# Patient Record
Sex: Female | Born: 1975
Health system: Southern US, Community
[De-identification: ages and names within clinical notes are randomized; demographics above are authoritative.]

## PROBLEM LIST (undated history)

## (undated) DIAGNOSIS — I82409 Acute embolism and thrombosis of unspecified deep veins of unspecified lower extremity: Secondary | ICD-10-CM

## (undated) DIAGNOSIS — D649 Anemia, unspecified: Secondary | ICD-10-CM

## (undated) DIAGNOSIS — J0101 Acute recurrent maxillary sinusitis: Secondary | ICD-10-CM

## (undated) DIAGNOSIS — E785 Hyperlipidemia, unspecified: Secondary | ICD-10-CM

## (undated) DIAGNOSIS — M549 Dorsalgia, unspecified: Secondary | ICD-10-CM

## (undated) DIAGNOSIS — M1990 Unspecified osteoarthritis, unspecified site: Secondary | ICD-10-CM

## (undated) DIAGNOSIS — G8929 Other chronic pain: Secondary | ICD-10-CM

## (undated) DIAGNOSIS — D573 Sickle-cell trait: Secondary | ICD-10-CM

## (undated) DIAGNOSIS — J189 Pneumonia, unspecified organism: Secondary | ICD-10-CM

## (undated) DIAGNOSIS — K219 Gastro-esophageal reflux disease without esophagitis: Secondary | ICD-10-CM

## (undated) DIAGNOSIS — R011 Cardiac murmur, unspecified: Secondary | ICD-10-CM

## (undated) DIAGNOSIS — R0602 Shortness of breath: Secondary | ICD-10-CM

## (undated) DIAGNOSIS — J45909 Unspecified asthma, uncomplicated: Secondary | ICD-10-CM

## (undated) DIAGNOSIS — K589 Irritable bowel syndrome without diarrhea: Secondary | ICD-10-CM

## (undated) DIAGNOSIS — T783XXA Angioneurotic edema, initial encounter: Secondary | ICD-10-CM

## (undated) DIAGNOSIS — J8410 Pulmonary fibrosis, unspecified: Secondary | ICD-10-CM

## (undated) DIAGNOSIS — T07XXXA Unspecified multiple injuries, initial encounter: Secondary | ICD-10-CM

## (undated) DIAGNOSIS — R1011 Right upper quadrant pain: Secondary | ICD-10-CM

## (undated) DIAGNOSIS — G894 Chronic pain syndrome: Secondary | ICD-10-CM

## (undated) DIAGNOSIS — L309 Dermatitis, unspecified: Secondary | ICD-10-CM

## (undated) HISTORY — DX: Unspecified multiple injuries, initial encounter: T07.XXXA

## (undated) HISTORY — PX: ABDOMINAL HYSTERECTOMY: SHX81

## (undated) HISTORY — DX: Angioneurotic edema, initial encounter: T78.3XXA

## (undated) HISTORY — PX: FRACTURE SURGERY: SHX138

## (undated) HISTORY — PX: BACK SURGERY: SHX140

---

## 1997-10-13 HISTORY — PX: TUBAL LIGATION: SHX77

## 1998-01-17 ENCOUNTER — Inpatient Hospital Stay (HOSPITAL_COMMUNITY): Admission: AD | Admit: 1998-01-17 | Discharge: 1998-01-21 | Payer: Self-pay | Admitting: Obstetrics

## 1998-01-24 ENCOUNTER — Inpatient Hospital Stay (HOSPITAL_COMMUNITY): Admission: AD | Admit: 1998-01-24 | Discharge: 1998-01-24 | Payer: Self-pay | Admitting: Obstetrics

## 1998-01-25 ENCOUNTER — Encounter: Admission: RE | Admit: 1998-01-25 | Discharge: 1998-04-25 | Payer: Self-pay | Admitting: Obstetrics

## 1998-02-13 ENCOUNTER — Inpatient Hospital Stay (HOSPITAL_COMMUNITY): Admission: AD | Admit: 1998-02-13 | Discharge: 1998-02-15 | Payer: Self-pay | Admitting: *Deleted

## 1998-03-07 ENCOUNTER — Inpatient Hospital Stay (HOSPITAL_COMMUNITY): Admission: AD | Admit: 1998-03-07 | Discharge: 1998-03-07 | Payer: Self-pay | Admitting: Obstetrics

## 1998-03-20 ENCOUNTER — Inpatient Hospital Stay (HOSPITAL_COMMUNITY): Admission: AD | Admit: 1998-03-20 | Discharge: 1998-03-20 | Payer: Self-pay | Admitting: *Deleted

## 1998-10-13 HISTORY — PX: OTHER SURGICAL HISTORY: SHX169

## 2001-10-22 ENCOUNTER — Encounter: Payer: Self-pay | Admitting: Internal Medicine

## 2001-10-22 ENCOUNTER — Ambulatory Visit (HOSPITAL_COMMUNITY): Admission: RE | Admit: 2001-10-22 | Discharge: 2001-10-22 | Payer: Self-pay | Admitting: Internal Medicine

## 2006-10-16 ENCOUNTER — Emergency Department (HOSPITAL_COMMUNITY): Admission: EM | Admit: 2006-10-16 | Discharge: 2006-10-16 | Payer: Self-pay | Admitting: Emergency Medicine

## 2006-10-17 ENCOUNTER — Emergency Department (HOSPITAL_COMMUNITY): Admission: EM | Admit: 2006-10-17 | Discharge: 2006-10-17 | Payer: Self-pay | Admitting: Emergency Medicine

## 2007-09-17 ENCOUNTER — Emergency Department (HOSPITAL_COMMUNITY): Admission: EM | Admit: 2007-09-17 | Discharge: 2007-09-17 | Payer: Self-pay | Admitting: Emergency Medicine

## 2007-10-14 HISTORY — PX: PARTIAL HYSTERECTOMY: SHX80

## 2010-02-15 ENCOUNTER — Ambulatory Visit: Payer: Self-pay | Admitting: Family Medicine

## 2010-02-19 ENCOUNTER — Ambulatory Visit: Payer: Self-pay | Admitting: Family Medicine

## 2010-02-19 ENCOUNTER — Encounter: Payer: Self-pay | Admitting: Physician Assistant

## 2010-02-19 DIAGNOSIS — J209 Acute bronchitis, unspecified: Secondary | ICD-10-CM | POA: Insufficient documentation

## 2010-02-19 DIAGNOSIS — J309 Allergic rhinitis, unspecified: Secondary | ICD-10-CM | POA: Insufficient documentation

## 2010-02-19 DIAGNOSIS — K219 Gastro-esophageal reflux disease without esophagitis: Secondary | ICD-10-CM | POA: Insufficient documentation

## 2010-02-22 ENCOUNTER — Ambulatory Visit: Payer: Self-pay | Admitting: Family Medicine

## 2010-02-22 ENCOUNTER — Encounter: Payer: Self-pay | Admitting: Physician Assistant

## 2010-03-12 ENCOUNTER — Ambulatory Visit: Payer: Self-pay | Admitting: Family Medicine

## 2010-03-12 DIAGNOSIS — J45991 Cough variant asthma: Secondary | ICD-10-CM | POA: Insufficient documentation

## 2010-03-13 ENCOUNTER — Encounter: Payer: Self-pay | Admitting: Physician Assistant

## 2010-03-18 ENCOUNTER — Encounter: Payer: Self-pay | Admitting: Physician Assistant

## 2010-03-18 ENCOUNTER — Ambulatory Visit (HOSPITAL_COMMUNITY): Admission: RE | Admit: 2010-03-18 | Discharge: 2010-03-18 | Payer: Self-pay | Admitting: Family Medicine

## 2010-04-10 ENCOUNTER — Ambulatory Visit: Payer: Self-pay | Admitting: Family Medicine

## 2010-04-10 DIAGNOSIS — J0100 Acute maxillary sinusitis, unspecified: Secondary | ICD-10-CM | POA: Insufficient documentation

## 2010-04-25 ENCOUNTER — Encounter: Payer: Self-pay | Admitting: Physician Assistant

## 2010-05-16 ENCOUNTER — Encounter: Payer: Self-pay | Admitting: Physician Assistant

## 2010-07-08 ENCOUNTER — Telehealth: Payer: Self-pay | Admitting: Physician Assistant

## 2010-11-14 NOTE — Letter (Signed)
Summary: Work Excuse  [facility]  [address]   [place], [place] [address]   Phone: [phone]  Fax: [phone]    Today's Date: Feb 22, 2010  Name of Patient: Jodi [name]  The above named patient had a medical visit today at:   9:45 am .  Please take this into consideration when reviewing the time away from work/school.    Special Instructions:  [  ] None  [  ] To be off the remainder of today, returning to the normal work / school schedule tomorrow.  [  ] To be off until the next scheduled appointment on ______________________.  [ X ] Other _Remain off work until Monday 02/25/10 due to illness.  Sincerely yours,   Esperanza Sheets PA

## 2010-11-14 NOTE — Assessment & Plan Note (Signed)
Summary: sick- room 1   Vital Signs:  Patient profile:   35 year old female Height:      66.5 inches Weight:      137.50 pounds BMI:     21.94 O2 Sat:      100 % on Room air Pulse rate:   82 / minute Resp:     16 per minute BP sitting:   126 / 70  (left arm)  Vitals Entered By: Adella Hare LPN (April 10, 2010 11:07 AM) CC: sinus drainage, throat bothers her off and on and still having trouble with breathing Is Patient Diabetic? No Pain Assessment Patient in pain? no        CC:  sinus drainage and throat bothers her off and on and still having trouble with breathing.  History of Present Illness: Pt presents today stating that she is still having alot of problems with her allergies.  She continues to have nasal congestion, and itchy watery eyes.  Her voice is still hoarse off and on.  She is taking Xyzal daily and using flonase. She feels that in the last few days she is getting increased congestion and sinus pressure like her last sinus infection.  She states Levaquin worked well for her last time. Also is still having problems with her asthma.  She is using Flovent daily, but is needing albuterol 1-2 times a day.  Especially when at work. No HS awakening.  Current Medications (verified): 1)  Omeprazole 20 Mg Cpdr (Omeprazole) .... Take 1 Daily 2)  Xyzal 5 Mg Tabs (Levocetirizine Dihydrochloride) .Marland Kitchen.. 1 Daily 3)  Ventolin Hfa 108 (90 Base) Mcg/act Aers (Albuterol Sulfate) .... Use 2 Puffs Every 4 Hrs As Needed 4)  Flonase 50 Mcg/act Susp (Fluticasone Propionate) .... Use 2 Sprays Each Nostril Once Daily 5)  Flovent Hfa 110 Mcg/act Aero (Fluticasone Propionate  Hfa) .... Use 2 Puffs Two Times A Day .  Rinse Mouth and Spit After Each Use.  Allergies (verified): 1)  ! Compazine  Past History:  Past medical history reviewed for relevance to current acute and chronic problems.  Past Medical History: Allergic rhinitis Asthma  Review of Systems General:  Denies chills and  fever. ENT:  Complains of nasal congestion, postnasal drainage, and sinus pressure; denies earache and sore throat. CV:  Denies chest pain or discomfort. Resp:  Complains of cough, shortness of breath, and wheezing; denies sputum productive. Allergy:  Complains of itching eyes and seasonal allergies; denies sneezing.  Physical Exam  General:  Well-developed,well-nourished,in no acute distress; alert,appropriate and cooperative throughout examination Head:  Normocephalic and atraumatic without obvious abnormalities. No apparent alopecia or balding. Ears:  External ear exam shows no significant lesions or deformities.  Otoscopic examination reveals clear canals, tympanic membranes are intact bilaterally without bulging, retraction, inflammation or discharge. Hearing is grossly normal bilaterally. Nose:  Nasal turbs are severly swollen and pale bilatno external deformity and no sinus percussion tenderness.   Mouth:  Oral mucosa and oropharynx without lesions or exudates.  Teeth in good repair. Neck:  No deformities, masses, or tenderness noted. Lungs:  Normal respiratory effort, chest expands symmetrically. Lungs are clear to auscultation, no crackles or wheezes. Heart:  Normal rate and regular rhythm. S1 and S2 normal without gallop, murmur, click, rub or other extra sounds. Cervical Nodes:  No lymphadenopathy noted Psych:  Cognition and judgment appear intact. Alert and cooperative with normal attention span and concentration. No apparent delusions, illusions, hallucinations   Impression & Recommendations:  Problem #  1:  SINUSITIS, ACUTE (ICD-461.9) Assessment New  The following medications were removed from the medication list:    Flonase 50 Mcg/act Susp (Fluticasone propionate) ..... Use 2 sprays each nostril once daily Her updated medication list for this problem includes:    Levaquin 750 Mg Tabs (Levofloxacin) .Marland Kitchen... Take 1 daily x 5 days  Problem # 2:  ALLERGIC RHINITIS  (ICD-477.9) Assessment: Unchanged  The following medications were removed from the medication list:    Flonase 50 Mcg/act Susp (Fluticasone propionate) ..... Use 2 sprays each nostril once daily Her updated medication list for this problem includes:    Xyzal 5 Mg Tabs (Levocetirizine dihydrochloride) .Marland Kitchen... 1 daily  Orders: Allergy Referral  (Allergy)  Problem # 3:  ASTHMA, INTERMITTENT (ICD-493.90) Assessment: Deteriorated  The following medications were removed from the medication list:    Flovent Hfa 110 Mcg/act Aero (Fluticasone propionate  hfa) ..... Use 2 puffs two times a day .  rinse mouth and spit after each use. Her updated medication list for this problem includes:    Ventolin Hfa 108 (90 Base) Mcg/act Aers (Albuterol sulfate) ..... Use 2 puffs every 4 hrs as needed    Advair Diskus 250-50 Mcg/dose Aepb (Fluticasone-salmeterol) ..... Use one inhalation two times a day    Singulair 10 Mg Tabs (Montelukast sodium) .Marland Kitchen... Take 1 daily for asthna and allergies  Orders: Allergy Referral  (Allergy)  Complete Medication List: 1)  Omeprazole 20 Mg Cpdr (Omeprazole) .... Take 1 daily 2)  Xyzal 5 Mg Tabs (Levocetirizine dihydrochloride) .Marland Kitchen.. 1 daily 3)  Ventolin Hfa 108 (90 Base) Mcg/act Aers (Albuterol sulfate) .... Use 2 puffs every 4 hrs as needed 4)  Levaquin 750 Mg Tabs (Levofloxacin) .... Take 1 daily x 5 days 5)  Advair Diskus 250-50 Mcg/dose Aepb (Fluticasone-salmeterol) .... Use one inhalation two times a day 6)  Singulair 10 Mg Tabs (Montelukast sodium) .... Take 1 daily for asthna and allergies  Patient Instructions: 1)  Please schedule a follow-up appointment as needed. 2)  I have referred you to an allergist. 3)  I have changed your asthma medicine from Flovent to Advair. 4)  I have added Singulair.  This helps allergies and asthma. 5)  Continue Xyzal. Prescriptions: SINGULAIR 10 MG TABS (MONTELUKAST SODIUM) take 1 daily for asthna and allergies  #30 x 2   Entered  and Authorized by:   Esperanza Sheets PA   Signed by:   Esperanza Sheets PA on 04/10/2010   Method used:   Electronically to        [facility]  [address] (retail)       [address]       [place], [place]  [address]       Ph: [phone]       Fax: [phone]   RxID:   [id number] ADVAIR DISKUS 250-50 MCG/DOSE AEPB (FLUTICASONE-SALMETEROL) use one inhalation two times a day  #1 x 2   Entered and Authorized by:   Esperanza Sheets PA   Signed by:   Esperanza Sheets PA on 04/10/2010   Method used:   Electronically to        [facility]  [address] (retail)       [address]       [place], [place]  [address]       Ph: [phone]       Fax: [phone]   RxID:  [id number] LEVAQUIN 750 MG TABS (LEVOFLOXACIN) take 1 daily x 5 days  #5 x 0   Entered and Authorized by:   Esperanza Sheets PA   Signed by:   Esperanza Sheets PA on 04/10/2010   Method used:   Electronically to        [facility]  [address] (retail)       [address]       [place], [place]  [address]       Ph: [phone]       Fax: [phone]   RxID:   [id number]

## 2010-11-14 NOTE — Assessment & Plan Note (Signed)
Summary: NEW PATIENT- room 1   Vital Signs:  Patient profile:   35 year old female Height:      66.5 inches Weight:      133.75 pounds BMI:     21.34 O2 Sat:      100 % on Room air Pulse rate:   83 / minute Resp:     16 per minute BP sitting:   136 / 80  (left arm)  Vitals Entered By: Adella Hare LPN (Feb 15, 1609 10:35 AM) CC: new patient Is Patient Diabetic? No Pain Assessment Patient in pain? no        CC:  new patient.  History of Present Illness: New pt here to establish care with new PCP.  She states that she has a sinus infection.  She has had them frequently in the past.  Last one was end of 2010.  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  This is a colder area.  Sxs started yesterday.  She reports nasal congestion, & post nasal drainage. Throat is sore & swollen feeling. She is taking some over the counter cold meds which do help.  She has not noticed the color of her mucus.  She denies cough or chest congestion.  Pt is otherwise healthy.  No other complaints or concerns. See's GYN yrly & is due later this summer. Labs have been done in the last couple of yrs and were all nl.   Current Medications (verified): 1)  None  Allergies (verified): 1)  ! Compazine  Past History:  Past medical, surgical, family and social histories (including risk factors) reviewed for relevance to current acute and chronic problems.  Past Surgical History: Tubal ligation 1999 Blood clot removed from lower abdomen 2000 Cyst removed from ovaryand fluid removed from tubes 2000 Partial Hysterectomy 2009  Family History: Reviewed history and no changes required. Mother living- asthma, chronic bronchitis, htn Father living- htn, recurrant rectal polyps, hyperlipidemia Two sisters living- depression, diabetes, lupus One brother deceased- MVA  Social History: Reviewed history and no changes required. Employed full timeHoliday representative and gamble,  Set designer Divorced 3 children 18,15, 11 Quit smoking this year Alcohol use-no Drug use-no Regular exercise-no Drug Use:  no Does Patient Exercise:  no  Review of Systems General:  Denies chills and fever. ENT:  Complains of nasal congestion, postnasal drainage, and sore throat; denies ear discharge and sinus pressure. CV:  Denies chest pain or discomfort. Resp:  Denies cough and shortness of breath. Allergy:  Complains of seasonal allergies; denies sneezing.  Physical Exam  General:  Well-developed,well-nourished,in no acute distress; alert,appropriate and cooperative throughout examination Head:  Normocephalic and atraumatic without obvious abnormalities. No apparent alopecia or balding. Ears:  External ear exam shows no significant lesions or deformities.  Otoscopic examination reveals clear canals, tympanic membranes are intact bilaterally without bulging, retraction, inflammation or discharge. Hearing is grossly normal bilaterally. Nose:  no external deformity, no sinus percussion tenderness, mucosal erythema, and mucosal edema.   Mouth:  Oral mucosa and oropharynx without lesions or exudates.  Teeth in good repair. Neck:  No deformities, masses, or tenderness noted. Lungs:  Normal respiratory effort, chest expands symmetrically. Lungs are clear to auscultation, no crackles or wheezes. Heart:  Normal rate and regular rhythm. S1 and S2 normal without gallop, murmur, click, rub or other extra sounds. Cervical Nodes:  No lymphadenopathy noted Psych:  Cognition and judgment appear intact. Alert and cooperative with normal attention span and concentration.  No apparent delusions, illusions, hallucinations   Impression & Recommendations:  Problem # 1:  SINUSITIS, ACUTE (ICD-461.9) Assessment New  Her updated medication list for this problem includes:    Amoxicillin 875 Mg Tabs (Amoxicillin) .Marland Kitchen... Take 1 two times a day for 10 days  Complete Medication List: 1)  Amoxicillin  875 Mg Tabs (Amoxicillin) .... Take 1 two times a day for 10 days  Patient Instructions: 1)  Please schedule a follow-up appointment as needed. 2)  Get plenty of rest, drink lots of clear liquids, and use Tylenol or Ibuprofen for fever and comfort. Return in 7-10 days if you're not better:sooner if you're feeling worse. 3)  You have received a shot of Depo Medrol today to help with your sinus swelling & infection. 4)  I have also prescribed an antibiotic for you. Prescriptions: AMOXICILLIN 875 MG TABS (AMOXICILLIN) take 1 two times a day for 10 days  #20 x 0   Entered and Authorized by:   Esperanza Sheets PA   Signed by:   Adella Hare LPN on 16/07/9603   Method used:   Electronically to        [facility] (retail)       [address]       [place], [place]  [address]       Ph: [phone]       Fax: [phone]   RxID:   [id number]   Appended Document: NEW PATIENT- room 1   Medication Administration  Injection # 1:    Medication: Depo- Medrol 80mg     Diagnosis: SINUSITIS, ACUTE (ICD-461.9)    Route: IM    Site: RUOQ gluteus    Exp Date: 1/12    Lot #: Johny [id number]    Mfr: Pharmacia    Patient tolerated injection without complications    Given by: Adella Hare LPN (Feb 15, 1026 4:06 PM)  Orders Added: 1)  Depo- Medrol 80mg  [J1040] 2)  Admin of Therapeutic Inj  intramuscular or subcutaneous [25366]

## 2010-11-14 NOTE — Assessment & Plan Note (Signed)
Summary: sick- room 3   Vital Signs:  Patient profile:   35 year old female Height:      66.5 inches Weight:      128.25 pounds BMI:     20.46 O2 Sat:      100 % on Room air Pulse rate:   86 / minute Resp:     16 per minute BP sitting:   120 / 80  (left arm)  Vitals Entered By: Adella Hare LPN (Feb 19, 2010 9:16 AM) CC: head congestion, chills, chest pains, body aches Is Patient Diabetic? No Pain Assessment Patient in pain? no        CC:  head congestion, chills, chest pains, and body aches.  History of Present Illness: Pt is here today due to cough, and burning in her chest.  This started after she tried to return to work.  Worked 1 day only. She is still having nasal congestion but states she feels like her sinus congestion has improved with her abx rx.  This is clear in color. + sneezing and itchy watery eyes.  No sinus pressure.  She is taking her antibiotic as prescribed.  Also has intermittent sharp pains in Lt chest x 1-2 mos.  No assoc with food/eating.  + indigestion.  Has been using over the counter gas prods and has helped with pain and indigestion. No HS syptoms.  Notices more when active and moving.  No diaphoresis or difficulty breathing.  Appetite has been normal.  Hx of seasonal allergies.  States Loratdine, and Allegra no help in the past.  Zyrtec did work well.  Not taking anything currently.  Current Medications (verified): 1)  Amoxicillin 875 Mg Tabs (Amoxicillin) .... Take 1 Two Times A Day For 10 Days  Allergies (verified): 1)  ! Compazine  Past History:  Past medical history reviewed for relevance to current acute and chronic problems.  Review of Systems General:  Denies chills and fever. CV:  Complains of chest pain or discomfort; denies lightheadness, palpitations, and shortness of breath with exertion. Resp:  Complains of cough; denies shortness of breath, sputum productive, and wheezing. Allergy:  Complains of persistent infections, seasonal  allergies, and sneezing.  Physical Exam  General:  Well-developed,well-nourished,in no acute distress; alert,appropriate and cooperative throughout examination Head:  Normocephalic and atraumatic without obvious abnormalities. No apparent alopecia or balding. Ears:  External ear exam shows no significant lesions or deformities.  Otoscopic examination reveals clear canals, tympanic membranes are intact bilaterally without bulging, retraction, inflammation or discharge. Hearing is grossly normal bilaterally. Nose:  Bilat nasal turbs mod swollen and pale.no external deformity and no sinus percussion tenderness.   Mouth:  Oral mucosa and oropharynx without lesions or exudates.  Teeth in good repair. Neck:  No deformities, masses, or tenderness noted. Chest Wall:  no deformities and costochondrial tenderness Lt 4th rib only.   Lungs:  Normal respiratory effort, chest expands symmetrically. Lungs are clear to auscultation, no crackles or wheezes. Heart:  Normal rate and regular rhythm. S1 and S2 normal without gallop, murmur, click, rub or other extra sounds. Abdomen:  soft, no masses, no hepatomegaly, and no splenomegaly.  Pt does have mild TTP at epigastrum. Cervical Nodes:  No lymphadenopathy noted Psych:  Cognition and judgment appear intact. Alert and cooperative with normal attention span and concentration. No apparent delusions, illusions, hallucinations   Impression & Recommendations:  Problem # 1:  ALLERGIC RHINITIS (ICD-477.9) Assessment Deteriorated If Xyzal is not covered by ins pt will purchase Zyrtec  over the counter.  Her updated medication list for this problem includes:    Xyzal 5 Mg Tabs (Levocetirizine dihydrochloride) .Marland Kitchen... 1 daily  Problem # 2:  ACUTE BRONCHITIS (ICD-466.0) Assessment: New discussed with pt that this is most likely viral.  Her updated medication list for this problem includes:    Amoxicillin 875 Mg Tabs (Amoxicillin) .Marland Kitchen... Take 1 two times a day for 10  days    Tessalon Perles 100 Mg Caps (Benzonatate) .Marland Kitchen... Take 1-2 every 8 hrs as needed for cough  Problem # 3:  GERD (ICD-530.81) Assessment: New  Her updated medication list for this problem includes:    Omeprazole 20 Mg Cpdr (Omeprazole) .Marland Kitchen... Take 1 daily  Problem # 4:  SINUSITIS, ACUTE (ICD-461.9) Assessment: Improved complete antibiotic prescription.  Her updated medication list for this problem includes:    Amoxicillin 875 Mg Tabs (Amoxicillin) .Marland Kitchen... Take 1 two times a day for 10 days    Tessalon Perles 100 Mg Caps (Benzonatate) .Marland Kitchen... Take 1-2 every 8 hrs as needed for cough  Complete Medication List: 1)  Amoxicillin 875 Mg Tabs (Amoxicillin) .... Take 1 two times a day for 10 days 2)  Omeprazole 20 Mg Cpdr (Omeprazole) .... Take 1 daily 3)  Xyzal 5 Mg Tabs (Levocetirizine dihydrochloride) .Marland Kitchen.. 1 daily 4)  Tessalon Perles 100 Mg Caps (Benzonatate) .... Take 1-2 every 8 hrs as needed for cough  Patient Instructions: 1)  Please schedule a follow-up appointment as needed. 2)  Continue Amoxicillin. 3)  I have prescribed an allergy pill, a stomach medicine, and some cough medicine for you. 4)  Increased fluids and rest. 5)  Recommended remaining out of work for 3 additional days. Prescriptions: TESSALON PERLES 100 MG CAPS (BENZONATATE) take 1-2 every 8 hrs as needed for cough  #30 x 0   Entered and Authorized by:   Esperanza Sheets PA   Signed by:   Esperanza Sheets PA on 02/19/2010   Method used:   Electronically to        [facility]  [address] (retail)       [address]       [place], [place]  [address]       Ph: [phone]       Fax: [phone]   RxID:   [id number] XYZAL 5 MG TABS (LEVOCETIRIZINE DIHYDROCHLORIDE) 1 daily  #30 x 2   Entered and Authorized by:   Esperanza Sheets PA   Signed by:   Esperanza Sheets PA on 02/19/2010   Method used:   Electronically to        [facility]  [address] (retail)       [address]       [place], [place]  [address]       Ph: [phone]       Fax: [phone]   RxID:   [id number] OMEPRAZOLE 20 MG CPDR (OMEPRAZOLE) take 1 daily  #30 x 3   Entered and Authorized by:   Esperanza Sheets PA   Signed by:   Esperanza Sheets PA on 02/19/2010   Method used:   Electronically to        [facility]  [address] (retail)       [address]       [place], [place]  [address]       Ph: [phone]  Fax: [phone]   RxID:   [id number]

## 2010-11-14 NOTE — Letter (Signed)
Summary: [employer]MLA PAPERS  [employer]ML[employer] P[employer]ERS   Imported By: Lind Guest 03/13/2010 14:32:09  _____________________________________________________________________  External Attachment:    Type:   Image     Comment:   External Document

## 2010-11-14 NOTE — Assessment & Plan Note (Signed)
Summary: Still sick ROOM 3   Vital Signs:  Patient profile:   35 year old female Height:      66.5 inches Weight:      132.75 pounds BMI:     21.18 O2 Sat:      99 % Temp:     97.5 degrees F oral Pulse rate:   76 / minute Resp:     16 per minute BP sitting:   120 / 84  (left arm) Cuff size:   regular  Vitals Entered By: Everitt Amber LPN (Feb 22, 2010 9:53 AM) CC: Still hoarse, chest tight, unable to being up any phlegm, some chills, no fever   CC:  Still hoarse, chest tight, unable to being up any phlegm, some chills, and no fever.  History of Present Illness: Pt states that today she feels better than she has the last few days.  Her nasal congestion and allergies are doing better. In the last couple of days though she has developed tightness & wheezing in her chest. She still has chills though she has not had a fever.  She is taking her antibiotic, and allergy medicine as prescribed.  Had a hx of asthma as a young child but hasn't had problems with for many yrs. "I out grew it."  Current Medications (verified): 1)  Amoxicillin 875 Mg Tabs (Amoxicillin) .... Take 1 Two Times A Day For 10 Days 2)  Omeprazole 20 Mg Cpdr (Omeprazole) .... Take 1 Daily 3)  Xyzal 5 Mg Tabs (Levocetirizine Dihydrochloride) .Marland Kitchen.. 1 Daily 4)  Tessalon Perles 100 Mg Caps (Benzonatate) .... Take 1-2 Every 8 Hrs As Needed For Cough  Allergies (verified): 1)  ! Compazine  Past History:  Past medical history reviewed for relevance to current acute and chronic problems.  Review of Systems General:  Complains of chills; denies fever. ENT:  Denies earache, nasal congestion, sinus pressure, and sore throat. CV:  Denies chest pain or discomfort. Resp:  Complains of cough, shortness of breath, and wheezing; denies sputum productive.  Physical Exam  General:  Well-developed,well-nourished,in no acute distress; alert,appropriate and cooperative throughout examination Head:  Normocephalic and atraumatic  without obvious abnormalities. No apparent alopecia or balding. Ears:  External ear exam shows no significant lesions or deformities.  Otoscopic examination reveals clear canals, tympanic membranes are intact bilaterally without bulging, retraction, inflammation or discharge. Hearing is grossly normal bilaterally. Nose:  no external deformity.  Nasal turbs bilat still swollen & pale Mouth:  Oral mucosa and oropharynx without lesions or exudates.  Teeth in good repair. Neck:  No deformities, masses, or tenderness noted. Lungs:  Tight, decreased BS bilat.   After NMT good A/E.  No wheeze, rales, or rhonchi. Heart:  Normal rate and regular rhythm. S1 and S2 normal without gallop, murmur, click, rub or other extra sounds. Cervical Nodes:  No lymphadenopathy noted Psych:  Cognition and judgment appear intact. Alert and cooperative with normal attention span and concentration. No apparent delusions, illusions, hallucinations   Impression & Recommendations:  Problem # 1:  BRONCHITIS, ACUTE WITH MILD BRONCHOSPASM (ICD-466.0) Assessment Unchanged  The following medications were removed from the medication list:    Amoxicillin 875 Mg Tabs (Amoxicillin) .Marland Kitchen... Take 1 two times a day for 10 days Her updated medication list for this problem includes:    Tessalon Perles 100 Mg Caps (Benzonatate) .Marland Kitchen... Take 1-2 every 8 hrs as needed for cough    Ventolin Hfa 108 (90 Base) Mcg/act Aers (Albuterol sulfate) ..... Use 2 puffs  every 4 hrs as needed    Levaquin 750 Mg Tabs (Levofloxacin) .Marland Kitchen... Take 1 daily x 5 days  Problem # 2:  ALLERGIC RHINITIS (ICD-477.9) Assessment: Improved  Her updated medication list for this problem includes:    Xyzal 5 Mg Tabs (Levocetirizine dihydrochloride) .Marland Kitchen... 1 daily  Complete Medication List: 1)  Omeprazole 20 Mg Cpdr (Omeprazole) .... Take 1 daily 2)  Xyzal 5 Mg Tabs (Levocetirizine dihydrochloride) .Marland Kitchen.. 1 daily 3)  Tessalon Perles 100 Mg Caps (Benzonatate) .... Take 1-2  every 8 hrs as needed for cough 4)  Ventolin Hfa 108 (90 Base) Mcg/act Aers (Albuterol sulfate) .... Use 2 puffs every 4 hrs as needed 5)  Medrol (pak) 4 Mg Tabs (Methylprednisolone) .... Take as directed 6)  Levaquin 750 Mg Tabs (Levofloxacin) .... Take 1 daily x 5 days  Other Orders: Albuterol Sulfate Sol 1mg  unit dose (Z6109) Nebulizer Tx (60454)  Patient Instructions: 1)  Please schedule a follow-up appointment as needed. 2)  Get plenty of rest, drink lots of clear liquids, and use Tylenol or Ibuprofen for fever and comfort. Return in 7-10 days if you're not better:sooner if you're feeling worse. 3)  I have prescribed a steroid, a different antibiotic and an inhaler for you to use. 4)  Stop your previous antibiotic. 5)  You may still take your allergy pill Prescriptions: LEVAQUIN 750 MG TABS (LEVOFLOXACIN) take 1 daily x 5 days  #5 x 0   Entered and Authorized by:   Esperanza Sheets PA   Signed by:   Everitt Amber LPN on 09/81/1914   Method used:   Electronically to        [facility]  [address] (retail)       [address]       [place], [place]  [address]       Ph: [phone]       Fax: [phone]   RxID:   [id number] MEDROL (PAK) 4 MG TABS (METHYLPREDNISOLONE) take as directed  #1 pack x 0   Entered and Authorized by:   Esperanza Sheets PA   Signed by:   Everitt Amber LPN on 40/34/7425   Method used:   Electronically to        [facility]  [address] (retail)       [address]       [place], [place]  [address]       Ph: [phone]       Fax: [phone]   RxID:   [id number] VENTOLIN HFA 108 (90 BASE) MCG/ACT AERS (ALBUTEROL SULFATE) use 2 puffs every 4 hrs as needed  #1 x 0   Entered and Authorized by:   Esperanza Sheets PA   Signed by:   Everitt Amber LPN on 25/42/7062   Method used:   Electronically to        [facility]  [address] (retail)       [address]       [place], [place]  [address]       Ph: [phone]        Fax: [phone]   RxID:   [id number]    Medication Administration  Medication # 1:    Medication: Albuterol Sulfate Sol 1mg  unit dose    Diagnosis: ACUTE BRONCHITIS (ICD-466.0)    Dose: 2.5/79ml    Route: inhaled  Exp Date: 09/2010    Lot #: [id number]    Mfr: nephron    Patient tolerated medication without complications    Given by: Everitt Amber LPN (Feb 22, 2010 11:01 AM)  Orders Added: 1)  Albuterol Sulfate Sol 1mg  unit dose [J7613] 2)  Nebulizer Tx [94640] 3)  Est. Patient Level III [04540]

## 2010-11-14 NOTE — Progress Notes (Signed)
  Phone Note Call from Patient   Summary of Call: Patient called in and said she had UTI and wanted a sulfur drug called in. I advised her she would have to make app and she said that she cannot miss work for a UTI and her old doc knows she gets them all the time and calls her in [place]. She wants to know if you would send her something to [facility]. I told her I could ask but the policy was an OV. What do you want to do for this patient? Initial call taken by: Everitt Amber LPN,  July 08, 2010 1:39 PM  Follow-up for Phone Call        You are correct.  She needs an OV.  If she cannot come in during office hours then I recommend she go to an urgent care. Follow-up by: Esperanza Sheets PA,  July 09, 2010 8:18 AM  Additional Follow-up for Phone Call Additional follow up Details #1::        I advised patient she needed an OV and she did not like that but I told her it was the policy and that I was sorry but she could try urgent care and she said they  didn't take her insurance. She is going to call her OB again since her work is short staffed and she can't miss any time Additional Follow-up by: Everitt Amber LPN,  July 09, 2010 3:08 PM

## 2010-11-14 NOTE — Letter (Signed)
Summary: Out of Work  [facility]  [address]   [place], [place] [address]   Phone: [phone]  Fax: [phone]    Feb 19, 2010   Employee:  DALEYSSA LOISELLE    To Whom It May Concern:   For Medical reasons, please excuse the above named employee from work for the following dates:  Start:   02/17/10  End:   02/22/10 may return to work without restriction  If you need additional information, please feel free to contact our office.         Sincerely,    Esperanza Sheets PA

## 2010-11-14 NOTE — Assessment & Plan Note (Signed)
Summary: fmla papers / sore throat - room 1   Vital Signs:  Patient profile:   35 year old female Height:      66.5 inches Weight:      133.75 pounds BMI:     21.34 O2 Sat:      100 % on Room air Pulse rate:   86 / minute Resp:     16 per minute BP sitting:   110 / 78  (left arm)  Vitals Entered By: Adella Hare LPN (Mar 12, 2010 9:02 AM) CC: fmla papers and sore throat Is Patient Diabetic? No Pain Assessment Patient in pain? no      Comments did not bring meds to ov   CC:  fmla papers and sore throat.  History of Present Illness: Pt states she is getting post nasal drainage and this is making her throat a little sore.  Alot of sneezing.  She is taking her Xyzal daily but isnt lasting 24 hrs, so she is taking it earlier every day. she is wheezing approx 3 times a week, relieved with albuterol inhaler. This is mostly at work. She had a hx of asthma as a child, but hasn't had any syptoms or problems for many yrs.  If yawns or stretches gets pain in her Lt upper chest to shoulder area. No pain with mvmt of Rt UE/shoulder.  No trauma.  Her heartburn is doing much better with Omeprazle.  No abd pain.      Asthma History    Initial Asthma Severity Rating:    Age range: 12+ years    Symptoms: >2 days/week; not daily    Nighttime Awakenings: 0-2/month    Interferes w/ normal activity: minor limitations    Exacerbations requiring oral systemic steroids: 0-1/year    Asthma Severity Assessment: Mild Persistent    Allergies (verified): 1)  ! Compazine  Review of Systems General:  Denies chills and fever. ENT:  Complains of nasal congestion, postnasal drainage, and sore throat; denies earache and sinus pressure. CV:  Complains of chest pain or discomfort; denies palpitations. Resp:  Complains of shortness of breath and wheezing; denies cough. GI:  Denies indigestion, nausea, and vomiting. Allergy:  Complains of seasonal allergies and sneezing.  Physical Exam  General:   Well-developed,well-nourished,in no acute distress; alert,appropriate and cooperative throughout examination Head:  Normocephalic and atraumatic without obvious abnormalities. No apparent alopecia or balding. Ears:  External ear exam shows no significant lesions or deformities.  Otoscopic examination reveals clear canals, tympanic membranes are intact bilaterally without bulging, retraction, inflammation or discharge. Hearing is grossly normal bilaterally. Nose:  External nasal examination shows no deformity or inflammation. Nasal mucosa are pink and moist without lesions or exudates.no sinus percussion tenderness.   Mouth:  Oral mucosa and oropharynx without lesions or exudates.  Teeth in good repair. Neck:  No deformities, masses, or tenderness noted. Chest Wall:  TTP Rt upper chest 2nd and 3rd ribs, and pectoralis muscle.   Lungs:  Normal respiratory effort, chest expands symmetrically. Lungs are clear to auscultation, no crackles or wheezes. Heart:  Normal rate and regular rhythm. S1 and S2 normal without gallop, murmur, click, rub or other extra sounds. Cervical Nodes:  No lymphadenopathy noted Psych:  Cognition and judgment appear intact. Alert and cooperative with normal attention span and concentration. No apparent delusions, illusions, hallucinations   Impression & Recommendations:  Problem # 1:  ALLERGIC RHINITIS (ICD-477.9) Assessment Improved  Her updated medication list for this problem includes:    Xyzal  5 Mg Tabs (Levocetirizine dihydrochloride) .Marland Kitchen... 1 daily    Flonase 50 Mcg/act Susp (Fluticasone propionate) ..... Use 2 sprays each nostril once daily  Problem # 2:  ASTHMA, INTERMITTENT (ICD-493.90) Assessment: Deteriorated  The following medications were removed from the medication list:    Medrol (pak) 4 Mg Tabs (Methylprednisolone) .Marland Kitchen... Take as directed Her updated medication list for this problem includes:    Ventolin Hfa 108 (90 Base) Mcg/act Aers (Albuterol sulfate)  ..... Use 2 puffs every 4 hrs as needed    Flovent Hfa 110 Mcg/act Aero (Fluticasone propionate  hfa) ..... Use 2 puffs two times a day .  rinse mouth and spit after each use.  Orders: Misc. Referral (Misc. Ref)  Problem # 3:  CHEST WALL PAIN, ANTERIOR (ZOX-096.04) Assessment: New  Problem # 4:  GERD (ICD-530.81) Assessment: Improved  Her updated medication list for this problem includes:    Omeprazole 20 Mg Cpdr (Omeprazole) .Marland Kitchen... Take 1 daily  Complete Medication List: 1)  Omeprazole 20 Mg Cpdr (Omeprazole) .... Take 1 daily 2)  Xyzal 5 Mg Tabs (Levocetirizine dihydrochloride) .Marland Kitchen.. 1 daily 3)  Ventolin Hfa 108 (90 Base) Mcg/act Aers (Albuterol sulfate) .... Use 2 puffs every 4 hrs as needed 4)  Flonase 50 Mcg/act Susp (Fluticasone propionate) .... Use 2 sprays each nostril once daily 5)  Flovent Hfa 110 Mcg/act Aero (Fluticasone propionate  hfa) .... Use 2 puffs two times a day .  rinse mouth and spit after each use.  Patient Instructions: 1)  Please schedule a follow-up appointment in 1 month. 2)  continue Xyzal once daily. 3)  I have prescibed Flonase nasal spray for allergies, to use as discussed. 4)  Continue using Ventolin inhaler as needed. 5)  I have prescribed Flovent inhaler to use two times a day for prevention.  Do no use Flovent as rescue if you are having wheezing. 6)  I have ordered a breathing test. 7)  You may take Tylenol as needed for your muscular chest wall pain. You may also try heat or ice to the area 3-4 times a day. Prescriptions: VENTOLIN HFA 108 (90 BASE) MCG/ACT AERS (ALBUTEROL SULFATE) use 2 puffs every 4 hrs as needed  #1 x 0   Entered and Authorized by:   Esperanza Sheets PA   Signed by:   Esperanza Sheets PA on 03/12/2010   Method used:   Electronically to        [facility]  [address] (retail)       [address]       [place], [place]  [address]       Ph: [phone]       Fax: [phone]   RxID:   [id number] FLOVENT HFA 110  MCG/ACT AERO (FLUTICASONE PROPIONATE  HFA) use 2 puffs two times a day .  rinse mouth and spit after each use.  #1 x 2   Entered and Authorized by:   Esperanza Sheets PA   Signed by:   Esperanza Sheets PA on 03/12/2010   Method used:   Electronically to        [facility]  [address] (retail)       [address]       [place], [place]  [address]       Ph: [phone]       Fax: [phone]   RxID:   [id number] FLONASE 50 MCG/ACT  SUSP (FLUTICASONE PROPIONATE) use 2 sprays each nostril once daily  #1 x 5   Entered and Authorized by:   Esperanza Sheets PA   Signed by:   Esperanza Sheets PA on 03/12/2010   Method used:   Electronically to        [facility]  [address] (retail)       [address]       [place], [place]  [address]       Ph: [phone]       Fax: [phone]   RxID:   [id number]   Appended Document: fmla papers / sore throat - room 1 PMH reviewed.

## 2010-11-14 NOTE — Letter (Signed)
Summary: ALLERGY ASTHMA & SINUS  ALLERGY ASTHMA & SINUS   Imported By: Lind Guest 06/27/2010 10:35:20  _____________________________________________________________________  External Attachment:    Type:   Image     Comment:   External Document

## 2010-11-14 NOTE — Progress Notes (Signed)
Summary: ALLERGY AND ASTHMA  ALLERGY AND ASTHMA   Imported By: Lind Guest 05/21/2010 14:14:16  _____________________________________________________________________  External Attachment:    Type:   Image     Comment:   External Document

## 2010-11-29 ENCOUNTER — Telehealth: Payer: Self-pay | Admitting: Family Medicine

## 2010-12-02 ENCOUNTER — Telehealth: Payer: Self-pay | Admitting: Family Medicine

## 2010-12-04 NOTE — Progress Notes (Signed)
Summary: needs a mask  Phone Note Call from Patient   Summary of Call: lost her nebulazer mask and needs for someone to call her a mask in at [facility] in [place]. Call back at [phone] to let her know. Initial call taken by: Lind Guest,  November 29, 2010 1:09 PM  Follow-up for Phone Call        pls send in for 1 nebulizer mask, but also remind pt she needs to sched and keep appt here,hasn't been here in over 6 months, and I have never evaluated her Follow-up by: Syliva Overman MD,  November 29, 2010 1:31 PM  Additional Follow-up for Phone Call Additional follow up Details #1::        Patient aware that neb mask sent in and that she needed to make an appt. Luann to schedule  Additional Follow-up by: Everitt Amber LPN,  November 29, 2010 2:10 PM    New/Updated Medications: * NEBULIZER MASK (ADULT) UAD Prescriptions: NEBULIZER MASK (ADULT) UAD  #1 x 0   Entered by:   Everitt Amber LPN   Authorized by:   Syliva Overman MD   Signed by:   Everitt Amber LPN on 45/40/9811   Method used:   Printed then faxed to ...       [facility]  [address] [address] [facility] (retail)       [address]       [place], [place]  [address]       Ph: [phone]       Fax: [phone]   RxID:   [id number]   Appended Document: needs a mask Called patient back to make appoinment and had to leave a message. Patient hung up before I could get to her on the phone from [place].  Appended Document: needs a mask called patient again set up appoinment for 3.6.2012 @ 9:45

## 2010-12-10 NOTE — Progress Notes (Signed)
Summary: mask  Phone Note Call from Patient   Summary of Call: [facility] has not recieved anything from here on her rx  call back at [phone] Initial call taken by: Lind Guest,  December 02, 2010 3:04 PM  Follow-up for Phone Call        patient is aware this has been sent to [place] Follow-up by: Adella Hare LPN,  December 02, 2010 3:06 PM    Prescriptions: NEBULIZER MASK (ADULT) UAD  #1 x 0   Entered by:   Adella Hare LPN   Authorized by:   Syliva Overman MD   Signed by:   Adella Hare LPN on 16/07/9603   Method used:   Faxed to ...       [facility]* (retail)       [address]       [place], [place]  [address]       Ph: [phone]       Fax: [phone]   RxID:   [id number]

## 2010-12-10 NOTE — Progress Notes (Signed)
Summary: neb. mask  Phone Note Call from Patient   Summary of Call: patient is requseting a neb. mask please send to [facility] on [address] because [facility] not rec[facility] and now she is in [place] at work and this would be easier for her to pick up Initial call taken by: Lind Guest,  December 02, 2010 10:45 AM  Follow-up for Phone Call        [facility] because they did not carry these  patient aware Follow-up by: Adella Hare LPN,  December 02, 2010 1:23 PM

## 2010-12-17 ENCOUNTER — Other Ambulatory Visit: Payer: Self-pay | Admitting: Family Medicine

## 2010-12-17 ENCOUNTER — Ambulatory Visit (HOSPITAL_COMMUNITY)
Admission: RE | Admit: 2010-12-17 | Discharge: 2010-12-17 | Disposition: A | Discharge status: Home / Self Care | Payer: 59 | Source: Ambulatory Visit | Attending: Family Medicine | Admitting: Family Medicine

## 2010-12-17 ENCOUNTER — Encounter (HOSPITAL_COMMUNITY): Payer: Self-pay

## 2010-12-17 ENCOUNTER — Encounter: Payer: Self-pay | Admitting: Family Medicine

## 2010-12-17 ENCOUNTER — Ambulatory Visit (INDEPENDENT_AMBULATORY_CARE_PROVIDER_SITE_OTHER): Payer: Medicare HMO | Admitting: Family Medicine

## 2010-12-17 DIAGNOSIS — R0602 Shortness of breath: Secondary | ICD-10-CM | POA: Insufficient documentation

## 2010-12-17 DIAGNOSIS — J45909 Unspecified asthma, uncomplicated: Secondary | ICD-10-CM

## 2010-12-17 DIAGNOSIS — R079 Chest pain, unspecified: Secondary | ICD-10-CM

## 2010-12-17 DIAGNOSIS — E663 Overweight: Secondary | ICD-10-CM

## 2010-12-17 LAB — CBC WITH DIFFERENTIAL/PLATELET
Basophils Absolute: 0 10*3/uL (ref 0.0–0.1)
HCT: 34.8 % — ABNORMAL LOW (ref 36.0–46.0)
Lymphocytes Relative: 29 % (ref 12–46)
Lymphs Abs: 1.7 10*3/uL (ref 0.7–4.0)
Neutro Abs: 3.6 10*3/uL (ref 1.7–7.7)
Platelets: 282 10*3/uL (ref 150–400)
RBC: 3.89 MIL/uL (ref 3.87–5.11)
RDW: 13.1 % (ref 11.5–15.5)
WBC: 6 10*3/uL (ref 4.0–10.5)

## 2010-12-17 LAB — LIPID PANEL
HDL: 50 mg/dL (ref >39)
LDL Cholesterol: 128 mg/dL — ABNORMAL HIGH (ref 0–99)
Total CHOL/HDL Ratio: 3.8 Ratio
Triglycerides: 57 mg/dL (ref <150)
VLDL: 11 mg/dL (ref 0–40)

## 2010-12-17 LAB — BASIC METABOLIC PANEL
CO2: 27 mEq/L (ref 19–32)
Chloride: 102 mEq/L (ref 96–112)
Potassium: 4.3 mEq/L (ref 3.5–5.3)
Sodium: 138 mEq/L (ref 135–145)

## 2010-12-18 LAB — CONVERTED CEMR LAB
BUN: 10 mg/dL (ref 6–23)
CO2: 27 meq/L (ref 19–32)
Chloride: 102 meq/L (ref 96–112)
Creatinine, Ser: 0.79 mg/dL (ref 0.40–1.20)
Glucose, Bld: 74 mg/dL (ref 70–99)
Hemoglobin: 11.6 g/dL — ABNORMAL LOW (ref 12.0–15.0)
LDL Cholesterol: 128 mg/dL — ABNORMAL HIGH (ref 0–99)
Lymphocytes Relative: 29 % (ref 12–46)
Lymphs Abs: 1.7 10*3/uL (ref 0.7–4.0)
MCHC: 33.3 g/dL (ref 30.0–36.0)
Monocytes Absolute: 0.5 10*3/uL (ref 0.1–1.0)
Monocytes Relative: 8 % (ref 3–12)
Neutro Abs: 3.6 10*3/uL (ref 1.7–7.7)
Neutrophils Relative %: 60 % (ref 43–77)
Potassium: 4.3 meq/L (ref 3.5–5.3)
RBC: 3.89 M/uL (ref 3.87–5.11)
TSH: 3.347 microintl units/mL (ref 0.350–4.500)
Triglycerides: 57 mg/dL (ref <150)
VLDL: 11 mg/dL (ref 0–40)
WBC: 6 10*3/uL (ref 4.0–10.5)

## 2010-12-19 ENCOUNTER — Encounter: Payer: Self-pay | Admitting: Physician Assistant

## 2010-12-31 NOTE — Assessment & Plan Note (Signed)
Summary: office visit   Vital Signs:  Patient profile:   35 year old female Menstrual status:  hysterectomy Height:      66.5 inches Weight:      157.25 pounds BMI:     25.09 O2 Sat:      97 % Pulse rate:   92 / minute Pulse rhythm:   regular Resp:     16 per minute BP sitting:   120 / 80  (left arm) Cuff size:   regular  Vitals Entered By: Everitt Amber LPN (December 16, 1608 10:13 AM)  Nutrition Counseling: Patient's BMI is greater than 25 and therefore counseled on weight management options. CC: Follow up visit, still has a tickle in her throat and a dry cough. Also has some pain in her chest at times, feels like gas times and other times feels like if she takes a deep breath it feels like her heart being squeezed     Menstrual Status hysterectomy   CC:  Follow up visit, still has a tickle in her throat and a dry cough. Also has some pain in her chest at times, and feels like gas times and other times feels like if she takes a deep breath it feels like her heart being squeezed.  History of Present Illness: pt was treated for sinus infection 2 weeks ago, feels better, still ahs a tickle and a cough and intermittent voice loss. Has been having chest pain with deep breathing since she was sick feels like squeezing on her heart, she had a similar episode 5 years ago Pain is mostly aggravated by deep breathing climbing up the steps it fleeting, and twisiting upper body makes it worse but does not bring i ton. Non radiating on left sternal border no other symptoms with the pain. lasts up to 1 hour with deep breathing  Current Medications (verified): 1)  Omeprazole 20 Mg Cpdr (Omeprazole) .... Take 1 Daily 2)  Xyzal 5 Mg Tabs (Levocetirizine Dihydrochloride) .Marland Kitchen.. 1 Daily 3)  Ventolin Hfa 108 (90 Base) Mcg/act Aers (Albuterol Sulfate) .... Use 2 Puffs Every 4 Hrs As Needed 4)  Advair Diskus 250-50 Mcg/dose Aepb (Fluticasone-Salmeterol) .... Use One Inhalation Two Times A Day 5)   Singulair 10 Mg Tabs (Montelukast Sodium) .... Take 1 Daily For Asthna and Allergies 6)  Nebulizer Mask (Adult) .... Uad  Allergies (verified): 1)  ! Compazine  Past History:  Past medical, surgical, family and social histories (including risk factors) reviewed for relevance to current acute and chronic problems.  Past Medical History: Reviewed history from 04/10/2010 and no changes required. Allergic rhinitis Asthma  Past Surgical History: Reviewed history from 02/15/2010 and no changes required. Tubal ligation 1999 Blood clot removed from lower abdomen 2000 Cyst removed from ovaryand fluid removed from tubes 2000 Partial Hysterectomy 2009  Family History: Reviewed history from 02/15/2010 and no changes required. Mother living- asthma, chronic bronchitis, htn Father living- htn, recurrant rectal polyps, hyperlipidemia Two sisters living- depression, diabetes, lupus One brother deceased- MVA  Social History: Reviewed history from 02/15/2010 and no changes required. Employed full timeHoliday representative and gamble, Set designer Divorced 3 children 18,15, 11 Quit smoking this year Alcohol use-no Drug use-no Regular exercise-no  Review of Systems      See HPI General:  Complains of fatigue. Eyes:  Denies discharge, eye pain, and red eye. ENT:  Denies hoarseness, nasal congestion, and sinus pressure. CV:  Denies difficulty breathing while lying down, lightheadness, and swelling of feet. Resp:  Complains of  cough and pleuritic; denies shortness of breath and sputum productive. GI:  Denies abdominal pain, constipation, gas, nausea, and vomiting. GU:  Denies dysuria and urinary frequency. MS:  Denies joint pain, low back pain, mid back pain, and stiffness. Psych:  Denies anxiety and depression. Endo:  Denies cold intolerance, excessive hunger, and excessive thirst. Heme:  Denies abnormal bruising and bleeding. Allergy:  Complains of seasonal allergies.  Physical Exam  General:   Well-developed,well-nourished,in no acute distress; alert,appropriate and cooperative throughout examination HEENT: No facial asymmetry,  EOMI, No sinus tenderness, TM's Clear, oropharynx  pink and moist.   Chest: Clear to auscultation bilaterally.  CVS: S1, S2, No murmurs, No S3.   Abd: Soft, Nontender.  MS: Adequate ROM spine, hips, shoulders and knees.  Ext: No edema.   CNS: CN 2-12 intact, power tone and sensation normal throughout.   Skin: Intact, no visible lesions or rashes.  Psych: Good eye contact, normal affect.  Memory intact, not anxious or depressed appearing.    Impression & Recommendations:  Problem # 1:  OVERWEIGHT (ICD-278.02) Assessment Deteriorated  Ht: 66.5 (12/17/2010)   Wt: 157.25 (12/17/2010)   BMI: 25.09 (12/17/2010) therapeutic lifestyle change discussed and encouraged  Problem # 2:  CHEST PAIN UNSPECIFIED (ICD-786.50) Assessment: Comment Only  Orders: CXR- 2view (CXR) assesed as pleuritic and chest wall pain, ibuprofen and prednisone dose pack prescribed, pt to call if symptoms persist or worsen  Problem # 3:  ASTHMA (ICD-493.90) Assessment: Unchanged  Her updated medication list for this problem includes:    Ventolin Hfa 108 (90 Base) Mcg/act Aers (Albuterol sulfate) ..... Use 2 puffs every 4 hrs as needed    Advair Diskus 250-50 Mcg/dose Aepb (Fluticasone-salmeterol) ..... Use one inhalation two times a day    Singulair 10 Mg Tabs (Montelukast sodium) .Marland Kitchen... Take 1 daily for asthna and allergies    Prednisone (pak) 5 Mg Tabs (Prednisone) ..... Use as directed  Problem # 4:  GERD (ICD-530.81) Assessment: Unchanged  The following medications were removed from the medication list:    Nexium 40 Mg Cpdr (Esomeprazole magnesium) .Marland Kitchen... Take 1 capsule by mouth once a day Her updated medication list for this problem includes:    Omeprazole 20 Mg Cpdr (Omeprazole) .Marland Kitchen... Take 1 daily  Complete Medication List: 1)  Omeprazole 20 Mg Cpdr (Omeprazole) ....  Take 1 daily 2)  Xyzal 5 Mg Tabs (Levocetirizine dihydrochloride) .Marland Kitchen.. 1 daily 3)  Ventolin Hfa 108 (90 Base) Mcg/act Aers (Albuterol sulfate) .... Use 2 puffs every 4 hrs as needed 4)  Advair Diskus 250-50 Mcg/dose Aepb (Fluticasone-salmeterol) .... Use one inhalation two times a day 5)  Singulair 10 Mg Tabs (Montelukast sodium) .... Take 1 daily for asthna and allergies 6)  Nebulizer Mask (adult)  .... Uad 7)  Ibuprofen 800 Mg Tabs (Ibuprofen) .... Take 1 tablet by mouth three times a day 8)  Prednisone (pak) 5 Mg Tabs (Prednisone) .... Use as directed  Other Orders: T-Basic Metabolic Panel [id number]) T-Lipid Profile [phone]) T-CBC w/Diff [phone]) T-TSH [phone])  Patient Instructions: 1)  Please schedule a follow-up appointment in 4 months. 2)  It is important that you exercise regularly at least 40 minutes 5 times a week. If you develop chest pain, have severe difficulty breathing, or feel very tired , stop exercising immediately and seek medical attention. 3)  You need to lose weight. Consider a lower calorie diet and regular exercise.  4)  we will give 1 1500 calorie diet sheet 5)  BMP prior to visit,  ICD-9: 6)  Lipid Panel prior to visit, ICD-9: 7)  TSH prior to visit, ICD-9:   today 8)  CBC w/ Diff prior to visit, ICD-9: 9)  you have pleuritic chest pain, i will send in  ibuprofen and a prednisone dose pack Prescriptions: NEXIUM 40 MG CPDR (ESOMEPRAZOLE MAGNESIUM) Take 1 capsule by mouth once a day  #7 x 0   Entered and Authorized by:   Syliva Overman MD   Signed by:   Syliva Overman MD on 12/17/2010   Method used:   Print then Give to Patient   RxID:   [id number] PREDNISONE (PAK) 5 MG TABS (PREDNISONE) Use as directed  #21 x 0   Entered and Authorized by:   Syliva Overman MD   Signed by:   Syliva Overman MD on 12/17/2010   Method used:   Electronically to        [facility] [address] [facility] (retail)       [address]       [place], [place]  [address]       Ph: [phone]       Fax: [phone]   RxID:   [id number] IBUPROFEN 800 MG TABS (IBUPROFEN) Take 1 tablet by mouth three times a day  #30 x 0   Entered and Authorized by:   Syliva Overman MD   Signed by:   Syliva Overman MD on 12/17/2010   Method used:   Electronically to        [facility] [address] [facility] (retail)       [address]       [place], [place]  [address]       Ph: [phone]       Fax: [phone]   RxID:   [id number]    Orders Added: 1)  CXR- 2view [CXR] 2)  Est. Patient Level IV [[address]] 3)  T-Basic Metabolic Panel [[id number]] 4)  T-Lipid Profile [[id number]] 5)  T-CBC w/Diff [[id number]] 6)  T-TSH [[id number]]

## 2011-03-11 ENCOUNTER — Other Ambulatory Visit (HOSPITAL_COMMUNITY): Payer: Self-pay | Admitting: Orthopaedic Surgery

## 2011-03-11 DIAGNOSIS — R52 Pain, unspecified: Secondary | ICD-10-CM

## 2011-03-12 ENCOUNTER — Ambulatory Visit (HOSPITAL_COMMUNITY)
Admission: RE | Admit: 2011-03-12 | Discharge: 2011-03-12 | Disposition: A | Discharge status: Home / Self Care | Payer: Managed Care, Other (non HMO) | Source: Ambulatory Visit | Attending: Orthopaedic Surgery | Admitting: Orthopaedic Surgery

## 2011-03-12 DIAGNOSIS — S83509A Sprain of unspecified cruciate ligament of unspecified knee, initial encounter: Secondary | ICD-10-CM | POA: Insufficient documentation

## 2011-03-12 DIAGNOSIS — X500XXA Overexertion from strenuous movement or load, initial encounter: Secondary | ICD-10-CM | POA: Insufficient documentation

## 2011-03-12 DIAGNOSIS — M25569 Pain in unspecified knee: Secondary | ICD-10-CM | POA: Insufficient documentation

## 2011-03-12 DIAGNOSIS — R52 Pain, unspecified: Secondary | ICD-10-CM

## 2011-03-12 DIAGNOSIS — M25469 Effusion, unspecified knee: Secondary | ICD-10-CM | POA: Insufficient documentation

## 2011-04-24 ENCOUNTER — Encounter: Payer: Self-pay | Admitting: Physician Assistant

## 2011-05-02 ENCOUNTER — Encounter: Payer: Self-pay | Admitting: Family Medicine

## 2011-05-02 ENCOUNTER — Ambulatory Visit (INDEPENDENT_AMBULATORY_CARE_PROVIDER_SITE_OTHER): Payer: Managed Care, Other (non HMO) | Admitting: Family Medicine

## 2011-05-02 VITALS — BP 108/78 | HR 85 | Resp 16 | Ht 66.0 in | Wt 165.4 lb

## 2011-05-02 DIAGNOSIS — J309 Allergic rhinitis, unspecified: Secondary | ICD-10-CM

## 2011-05-02 DIAGNOSIS — Z23 Encounter for immunization: Secondary | ICD-10-CM

## 2011-05-02 DIAGNOSIS — E663 Overweight: Secondary | ICD-10-CM

## 2011-05-02 DIAGNOSIS — J45909 Unspecified asthma, uncomplicated: Secondary | ICD-10-CM

## 2011-05-02 DIAGNOSIS — J019 Acute sinusitis, unspecified: Secondary | ICD-10-CM

## 2011-05-02 MED ORDER — LEVOCETIRIZINE DIHYDROCHLORIDE 5 MG PO TABS: 5.0000 mg | ORAL_TABLET | Freq: Every evening | ORAL | Status: DC

## 2011-05-02 MED ORDER — AZITHROMYCIN 250 MG PO TABS: ORAL_TABLET | ORAL | Status: AC

## 2011-05-02 MED ORDER — MONTELUKAST SODIUM 10 MG PO TABS: 10.0000 mg | ORAL_TABLET | Freq: Every day | ORAL | Status: DC

## 2011-05-02 MED ORDER — ALBUTEROL SULFATE HFA 108 (90 BASE) MCG/ACT IN AERS: 2.0000 | INHALATION_SPRAY | Freq: Four times a day (QID) | RESPIRATORY_TRACT | Status: DC | PRN

## 2011-05-02 MED ORDER — ALBUTEROL SULFATE HFA 108 (90 BASE) MCG/ACT IN AERS: 2.0000 | INHALATION_SPRAY | RESPIRATORY_TRACT | Status: DC | PRN

## 2011-05-02 NOTE — Patient Instructions (Signed)
F/u in 6 months.   Pls  Change your diet, so that you lower your cholesterol and you lose weight. Goal is 8 pounds over the next  6 months.  You are being treated for sinusitis and will get refill on allergy med and asthma meds  Pneumonia vaccine today.

## 2011-05-03 NOTE — Assessment & Plan Note (Signed)
Acute onset of sinusitis, antibiotics prescribed

## 2011-05-03 NOTE — Assessment & Plan Note (Signed)
Uncontrolled symptoms, requests xyzal which works best for her

## 2011-05-03 NOTE — Assessment & Plan Note (Signed)
No recent episodes of wheezing , though definitely reports improved control with singulair, needs inhaler for as needed use also

## 2011-05-03 NOTE — Assessment & Plan Note (Signed)
Unchanged, pt had started exercise, and injured her left knee, dietary change still lagging

## 2011-05-03 NOTE — Progress Notes (Signed)
  Subjective:    Patient ID: Jodi [name], female    DOB: November 23, 1975, 35 y.o.   MRN: [id number]  HPI 1 week h/o increased sinus pressure with yellow nasal drainage, frontal headache , intermittent chills, and tickle in the throat. Denies productive cough or sore throat.enies ear pain She has been unable to lose weight , frustrated by injury to left knee with exercise, but plans to get on board soon C/O increased and uncontrolled allergy symptoms, requests xyzal specifically, which has been more effective than other medications    Review of Systems  Denies chest congestion, productive cough or wheezing. Denies chest pains, palpitations, paroxysmal nocturnal dyspnea, orthopnea and leg swelling Denies abdominal pain, nausea, vomiting,diarrhea or constipation.  Denies rectal bleeding or change in bowel movement. Denies dysuria, frequency, hesitancy or incontinence.  Denies headaches, seizure, numbness, or tingling. Denies depression, anxiety or insomnia. Denies skin break down or rash.        Objective:   Physical Exam Patient alert and oriented and in no Cardiopulmonary distress.  HEENT: No facial asymmetry, EOMI,frontal and maxillary  sinus tenderness, TM's clear, Oropharynx pink and moist.  Neck supple no adenopathy.Nasal mucosa erythematous and edematous  Chest: Clear to auscultation bilaterally.  CVS: S1, S2 no murmurs, no S3.  ABD: Soft non tender. Bowel sounds normal.  Ext: No edema  MS: Adequate ROM spine, shoulders, hips and reduced in left knee.  Skin: Intact, no ulcerations or rash noted.  Psych: Good eye contact, normal affect. Memory intact not anxious or depressed appearing.  CNS: CN 2-12 intact, power, tone and sensation normal throughout.        Assessment & Plan:

## 2011-07-20 ENCOUNTER — Emergency Department (HOSPITAL_COMMUNITY)
Admission: EM | Admit: 2011-07-20 | Discharge: 2011-07-20 | Disposition: A | Discharge status: Home / Self Care | Payer: Managed Care, Other (non HMO) | Attending: Emergency Medicine | Admitting: Emergency Medicine

## 2011-07-20 ENCOUNTER — Emergency Department (HOSPITAL_COMMUNITY): Payer: Managed Care, Other (non HMO)

## 2011-07-20 ENCOUNTER — Encounter (HOSPITAL_COMMUNITY): Payer: Self-pay | Admitting: *Deleted

## 2011-07-20 DIAGNOSIS — Z87891 Personal history of nicotine dependence: Secondary | ICD-10-CM | POA: Insufficient documentation

## 2011-07-20 DIAGNOSIS — J45909 Unspecified asthma, uncomplicated: Secondary | ICD-10-CM | POA: Insufficient documentation

## 2011-07-20 DIAGNOSIS — R079 Chest pain, unspecified: Secondary | ICD-10-CM | POA: Insufficient documentation

## 2011-07-20 DIAGNOSIS — Z79899 Other long term (current) drug therapy: Secondary | ICD-10-CM | POA: Insufficient documentation

## 2011-07-20 MED ORDER — ALBUTEROL SULFATE (5 MG/ML) 0.5% IN NEBU
5.0000 mg | INHALATION_SOLUTION | Freq: Once | RESPIRATORY_TRACT | Status: AC
  Administered 2011-07-20: 5 mg via RESPIRATORY_TRACT
  Filled 2011-07-20: qty 1

## 2011-07-20 MED ORDER — METHYLPREDNISOLONE SODIUM SUCC 125 MG IJ SOLR
125.0000 mg | Freq: Once | INTRAMUSCULAR | Status: AC
  Administered 2011-07-20: 125 mg via INTRAVENOUS
  Filled 2011-07-20: qty 2

## 2011-07-20 MED ORDER — PREDNISONE 50 MG PO TABS: 50.0000 mg | ORAL_TABLET | Freq: Every day | ORAL | Status: DC

## 2011-07-20 MED ORDER — IPRATROPIUM BROMIDE 0.02 % IN SOLN
0.5000 mg | Freq: Once | RESPIRATORY_TRACT | Status: AC
  Administered 2011-07-20: 0.5 mg via RESPIRATORY_TRACT
  Filled 2011-07-20: qty 2.5

## 2011-07-20 NOTE — ED Notes (Signed)
Starting have problems with her asthma yesterday, has been using her inhaler and nebs without much improvement, started having chest heaviness/tightness this am associated with nausea.

## 2011-07-20 NOTE — ED Notes (Signed)
Pt a/ox4. Resp even and unlabored. NAD at this time. D/C instructions and Rx reviewed with pt. Pt verbalized understanding. Pt ambulated to POV with steady gate.  

## 2011-07-20 NOTE — ED Provider Notes (Signed)
History     CSN: [id number] Arrival date & time: 07/20/2011 11:12 AM  Chief Complaint  Patient presents with  . Asthma  . Chest Pain    (Consider location/radiation/quality/duration/timing/severity/associated sxs/prior treatment) HPI patient has known history of asthma. She has had a flareup for past 24 hours. Her albuterol has been helping minimally. Typically does not have to come to the emergency department. No prodromal viral illnesses. No fever or chills. Minimal cough. No remitting or exacerbating factors.  Past Medical History  Diagnosis Date  . Asthma   . Allergic rhinitis     Past Surgical History  Procedure Date  . Tubal ligation 1999  . Blood clot removed from lower abdomen 2000  . Cyst removed from ovary and fluid removed from tubes 2000  . Partial hysterectomy 2009    Family History  Problem Relation Age of Onset  . Asthma Mother   . Hypertension Mother   . Bronchiolitis Mother   . Hypertension Father   . Hyperlipidemia Father     recurrent rectum polyps   . Diabetes Sister   . Lupus Sister   . Depression Sister     History  Substance Use Topics  . Smoking status: Former Games developer  . Smokeless tobacco: Not on file  . Alcohol Use: No    OB History    Grav Para Term Preterm Abortions TAB SAB Ect Mult Living                  Review of Systems  All other systems reviewed and are negative.    Allergies  Compazine  Home Medications   Current Outpatient Rx  Name Route Sig Dispense Refill  . ALBUTEROL SULFATE (2.5 MG/3ML) 0.083% IN NEBU Nebulization Take 2.5 mg by nebulization every 6 (six) hours as needed. For asthma     . ALBUTEROL SULFATE HFA 108 (90 BASE) MCG/ACT IN AERS Inhalation Inhale 2 puffs into the lungs every 4 (four) hours as needed. 1 Inhaler 3  . LEVOCETIRIZINE DIHYDROCHLORIDE 5 MG PO TABS Oral Take 1 tablet (5 mg total) by mouth every evening. 30 tablet 5  . MONTELUKAST SODIUM 10 MG PO TABS Oral Take 1 tablet (10 mg total) by  mouth daily. 30 tablet 5  . NEBULIZER/ADULT MASK KIT Does not apply by Does not apply route. Use as directed     . CETIRIZINE HCL 10 MG PO TABS Oral Take 10 mg by mouth daily.      Marland Kitchen FLUTICASONE-SALMETEROL 250-50 MCG/DOSE IN AEPB Inhalation Inhale 1 puff into the lungs every 12 (twelve) hours.      . IBUPROFEN 800 MG PO TABS Oral Take 800 mg by mouth every 8 (eight) hours as needed.      Marland Kitchen OMEPRAZOLE 20 MG PO TBEC Oral Take by mouth daily.      Marland Kitchen PREDNISONE (PAK) 5 MG PO TABS Oral Take by mouth. Use as directed        BP 131/68  Pulse 92  Temp(Src) 98.7 F (37.1 C) (Oral)  Resp 19  Ht 5\' 6"  (1.676 m)  Wt 165 lb (74.844 kg)  BMI 26.63 kg/m2  SpO2 100%  Physical Exam  Nursing note and vitals reviewed. Constitutional: She is oriented to person, place, and time. She appears well-developed and well-nourished.  HENT:  Head: Normocephalic and atraumatic.  Eyes: Conjunctivae and EOM are normal. Pupils are equal, round, and reactive to light.  Neck: Normal range of motion. Neck supple.  Cardiovascular: Normal rate and regular rhythm.  Pulmonary/Chest: Effort normal.       Minimal expiratory wheeze  Abdominal: Soft. Bowel sounds are normal.  Musculoskeletal: Normal range of motion.  Neurological: She is alert and oriented to person, place, and time.  Skin: Skin is warm and dry.  Psychiatric: She has a normal mood and affect.    ED Course  Procedures (including critical care time)  Labs Reviewed - No data to display Dg Chest 2 View  07/20/2011  *RADIOLOGY REPORT*  Clinical Data: Short of breath.  Asthma.  CHEST - 2 VIEW  Comparison: 06/26/2011.  Findings: Left lung is clear. No airspace disease.  No effusion. Mild right basilar atelectasis.  Cardiopericardial silhouette within normal limits.  There is a 4 mm density projected over the right anterior fourth rib end which appears unchanged compared to 12/17/2010.  This has changed positions slightly in the prior radiographs, suggesting  this is a pulmonary parenchymal read lesion rather than rib lesion.  Follow-up noncontrast chest CT recommended.  IMPRESSION:  1.  No active cardiopulmonary disease. 2.  4 mm right midlung pulmonary nodule.  Follow-up nonemergent noncontrast chest CT recommended for further assessment.  Notably, this has been stable dating back to 12/17/2010.  Original Report Authenticated By: Andreas Newport, M.D.     No diagnosis found.    MDM  Patient had minor asthma attack. Feels much better after breathing treatments and IV steroids. Results of chest x-ray discussed with patient. Specifically I mentioned the 4 mm nodule in the right lung. She will followup with Dr. Lodema Hong. She understands the need for a CT scan of her chest        Donnetta Hutching, MD 07/20/11 1419

## 2011-07-21 LAB — URINALYSIS, ROUTINE W REFLEX MICROSCOPIC
Glucose, UA: NEGATIVE
Ketones, ur: NEGATIVE
Protein, ur: NEGATIVE
Urobilinogen, UA: 1

## 2011-07-21 LAB — BASIC METABOLIC PANEL
BUN: 9
Calcium: 9.6
Chloride: 104
Creatinine, Ser: 0.91

## 2011-07-21 LAB — CBC
MCHC: 33.8
MCV: 91.8
Platelets: 299
WBC: 6.9

## 2011-07-21 LAB — LIPASE, BLOOD: Lipase: 14

## 2011-07-21 LAB — DIFFERENTIAL
Basophils Relative: 1
Eosinophils Absolute: 0.1 — ABNORMAL LOW
Lymphs Abs: 1.2
Neutrophils Relative %: 71

## 2011-07-22 ENCOUNTER — Encounter: Payer: Self-pay | Admitting: Family Medicine

## 2011-07-22 ENCOUNTER — Encounter: Payer: Self-pay | Admitting: Physician Assistant

## 2011-07-22 ENCOUNTER — Ambulatory Visit (INDEPENDENT_AMBULATORY_CARE_PROVIDER_SITE_OTHER): Payer: Managed Care, Other (non HMO) | Admitting: Family Medicine

## 2011-07-22 VITALS — BP 120/80 | HR 88 | Resp 16 | Ht 66.0 in | Wt 165.0 lb

## 2011-07-22 DIAGNOSIS — J309 Allergic rhinitis, unspecified: Secondary | ICD-10-CM

## 2011-07-22 DIAGNOSIS — J8410 Pulmonary fibrosis, unspecified: Secondary | ICD-10-CM | POA: Insufficient documentation

## 2011-07-22 DIAGNOSIS — J45909 Unspecified asthma, uncomplicated: Secondary | ICD-10-CM

## 2011-07-22 DIAGNOSIS — R911 Solitary pulmonary nodule: Secondary | ICD-10-CM

## 2011-07-22 DIAGNOSIS — J984 Other disorders of lung: Secondary | ICD-10-CM

## 2011-07-22 HISTORY — DX: Pulmonary fibrosis, unspecified: J84.10

## 2011-07-22 MED ORDER — FLUTICASONE-SALMETEROL 250-50 MCG/DOSE IN AEPB: INHALATION_SPRAY | RESPIRATORY_TRACT | Status: DC

## 2011-07-22 MED ORDER — ALBUTEROL SULFATE (2.5 MG/3ML) 0.083% IN NEBU: 2.5000 mg | INHALATION_SOLUTION | Freq: Four times a day (QID) | RESPIRATORY_TRACT | Status: DC | PRN

## 2011-07-22 MED ORDER — ALBUTEROL SULFATE HFA 108 (90 BASE) MCG/ACT IN AERS: 2.0000 | INHALATION_SPRAY | RESPIRATORY_TRACT | Status: DC | PRN

## 2011-07-22 MED ORDER — PREDNISONE (PAK) 5 MG PO TABS: 5.0000 mg | ORAL_TABLET | ORAL | Status: DC

## 2011-07-22 MED ORDER — FLUTICASONE PROPIONATE 50 MCG/ACT NA SUSP: 2.0000 | Freq: Every day | NASAL | Status: DC

## 2011-07-22 NOTE — Progress Notes (Signed)
  Subjective:    Patient ID: Jodi [name], female    DOB: 23-Mar-1976, 35 y.o.   MRN: [id number]  HPI 5 day h/o acute asthma flare, improving following Ed visit, last Sunday, 2 days ago. Now notes voice loss, also had some sinus drainage 3 days ago. No fever , chills, discolored sputum or drainage. Nodule on cxr need non contrast ct scan  Currently on 50mg  prednisone for 7 days   Review of Systems See HPI Denies chest pains, palpitations and leg swelling Denies abdominal pain, nausea, vomiting,diarrhea or constipation.   Denies dysuria, frequency, hesitancy or incontinence. Denies joint pain, swelling and limitation in mobility. Denies headaches, seizures, numbness, or tingling. Denies depression, anxiety or insomnia. Denies skin break down or rash.        Objective:   Physical Exam Patient alert and oriented and in no cardiopulmonary distress.  HEENT: No facial asymmetry, EOMI, no sinus tenderness,  oropharynx pink and moist.  Neck supple no adenopathy.  Chest: Decreased air entry with bilateral wheeze.  CVS: S1, S2 no murmurs, no S3.  ABD: Soft non tender. Bowel sounds normal.  Ext: No edema  MS: Adequate ROM spine, shoulders, hips and knees.  Skin: Intact, no ulcerations or rash noted.  Psych: Good eye contact, normal affect. Memory intact not anxious or depressed appearing.  CNS: CN 2-12 intact, power, tone and sensation normal throughout.        Assessment & Plan:

## 2011-07-22 NOTE — Patient Instructions (Addendum)
F/u in 4 months.  You are being treated for uncontrolled asthma, and allergies.  PLS start daily flonase nasal spray and advair daily to reduce asthma flares  Work excuse to return tomorrow  You are referred for a chest CT scan

## 2011-07-22 NOTE — Assessment & Plan Note (Addendum)
Uncontrolled with flare, pt to take prednisone dose pack on completion of the high dose steroid she is currently on, also to resule prophylactic inhaler, advair

## 2011-07-23 ENCOUNTER — Emergency Department (HOSPITAL_COMMUNITY): Payer: Managed Care, Other (non HMO)

## 2011-07-23 ENCOUNTER — Telehealth: Payer: Self-pay | Admitting: Physician Assistant

## 2011-07-23 ENCOUNTER — Encounter (HOSPITAL_COMMUNITY): Payer: Self-pay | Admitting: *Deleted

## 2011-07-23 ENCOUNTER — Inpatient Hospital Stay (HOSPITAL_COMMUNITY)
Admission: EM | Admit: 2011-07-23 | Discharge: 2011-07-25 | DRG: 203 | Disposition: A | Discharge status: Home / Self Care | Payer: Managed Care, Other (non HMO) | Attending: Family Medicine | Admitting: Family Medicine

## 2011-07-23 DIAGNOSIS — E876 Hypokalemia: Secondary | ICD-10-CM | POA: Diagnosis present

## 2011-07-23 DIAGNOSIS — J019 Acute sinusitis, unspecified: Secondary | ICD-10-CM

## 2011-07-23 DIAGNOSIS — J309 Allergic rhinitis, unspecified: Secondary | ICD-10-CM

## 2011-07-23 DIAGNOSIS — J45909 Unspecified asthma, uncomplicated: Secondary | ICD-10-CM

## 2011-07-23 DIAGNOSIS — T380X5A Adverse effect of glucocorticoids and synthetic analogues, initial encounter: Secondary | ICD-10-CM | POA: Diagnosis present

## 2011-07-23 DIAGNOSIS — J8410 Pulmonary fibrosis, unspecified: Secondary | ICD-10-CM | POA: Diagnosis present

## 2011-07-23 DIAGNOSIS — K219 Gastro-esophageal reflux disease without esophagitis: Secondary | ICD-10-CM | POA: Diagnosis present

## 2011-07-23 DIAGNOSIS — J984 Other disorders of lung: Secondary | ICD-10-CM | POA: Diagnosis present

## 2011-07-23 DIAGNOSIS — D72829 Elevated white blood cell count, unspecified: Secondary | ICD-10-CM | POA: Diagnosis present

## 2011-07-23 DIAGNOSIS — J45901 Unspecified asthma with (acute) exacerbation: Principal | ICD-10-CM | POA: Diagnosis present

## 2011-07-23 DIAGNOSIS — E663 Overweight: Secondary | ICD-10-CM

## 2011-07-23 DIAGNOSIS — R7309 Other abnormal glucose: Secondary | ICD-10-CM | POA: Diagnosis present

## 2011-07-23 DIAGNOSIS — D649 Anemia, unspecified: Secondary | ICD-10-CM | POA: Diagnosis present

## 2011-07-23 DIAGNOSIS — T50905A Adverse effect of unspecified drugs, medicaments and biological substances, initial encounter: Secondary | ICD-10-CM | POA: Diagnosis present

## 2011-07-23 DIAGNOSIS — J441 Chronic obstructive pulmonary disease with (acute) exacerbation: Secondary | ICD-10-CM

## 2011-07-23 DIAGNOSIS — R739 Hyperglycemia, unspecified: Secondary | ICD-10-CM | POA: Diagnosis present

## 2011-07-23 HISTORY — DX: Pulmonary fibrosis, unspecified: J84.10

## 2011-07-23 MED ORDER — METHYLPREDNISOLONE SODIUM SUCC 125 MG IJ SOLR
INTRAMUSCULAR | Status: AC
  Administered 2011-07-23: 125 mg
  Filled 2011-07-23: qty 2

## 2011-07-23 MED ORDER — ALBUTEROL SULFATE (5 MG/ML) 0.5% IN NEBU
2.5000 mg | INHALATION_SOLUTION | Freq: Once | RESPIRATORY_TRACT | Status: AC
  Administered 2011-07-23: 2.5 mg via RESPIRATORY_TRACT

## 2011-07-23 MED ORDER — ALBUTEROL SULFATE (5 MG/ML) 0.5% IN NEBU
5.0000 mg | INHALATION_SOLUTION | Freq: Once | RESPIRATORY_TRACT | Status: AC
  Administered 2011-07-23: 5 mg via RESPIRATORY_TRACT

## 2011-07-23 MED ORDER — ALBUTEROL SULFATE (5 MG/ML) 0.5% IN NEBU
INHALATION_SOLUTION | RESPIRATORY_TRACT | Status: AC
  Administered 2011-07-23: 5 mg via RESPIRATORY_TRACT
  Filled 2011-07-23: qty 1

## 2011-07-23 MED ORDER — ALBUTEROL (5 MG/ML) CONTINUOUS INHALATION SOLN
15.0000 mg/h | INHALATION_SOLUTION | Freq: Once | RESPIRATORY_TRACT | Status: AC
  Administered 2011-07-23: 15 mg/h via RESPIRATORY_TRACT
  Filled 2011-07-23: qty 20

## 2011-07-23 NOTE — ED Provider Notes (Signed)
Pt has hx of RAD and was seen in the ED about 3 days ago for same. Has had 1 nebulizer and a continuous nebulizer. I saw her after she was doing some walking in the ED and her lungs were clear, but very diminished and she appeared tight with some tachypnia with talking.   Ward [name], MD 07/23/11 2352

## 2011-07-23 NOTE — ED Notes (Signed)
Pt with asthma attack and difficulty to breathe today

## 2011-07-23 NOTE — ED Provider Notes (Signed)
History     CSN: [id number] Arrival date & time: 07/23/2011  9:54 PM  Chief Complaint  Patient presents with  . Asthma    (Consider location/radiation/quality/duration/timing/severity/associated sxs/prior treatment) HPI Comments: Pt states she usually does not have to come to ED but has been having increasing problem with difficulty breathing. She is on albuterol nebulizer, zyrtec, albuterol inhaler, singular, deltasone 50mg  tablets but continues to have some wheezing and difficulty with breathing. She was seen in ED on 10/7 and presents today for assistance with difficulty breathing.  Patient is a 35 y.o. female presenting with asthma. The history is provided by the patient.  Asthma This is a recurrent problem. The current episode started in the past 7 days. The problem occurs daily. The problem has been gradually worsening. Associated symptoms include congestion, coughing and fatigue.    Past Medical History  Diagnosis Date  . Asthma   . Allergic rhinitis     Past Surgical History  Procedure Date  . Tubal ligation 1999  . Blood clot removed from lower abdomen 2000  . Cyst removed from ovary and fluid removed from tubes 2000  . Partial hysterectomy 2009    Family History  Problem Relation Age of Onset  . Asthma Mother   . Hypertension Mother   . Bronchiolitis Mother   . Hypertension Father   . Hyperlipidemia Father     recurrent rectum polyps   . Diabetes Sister   . Lupus Sister   . Depression Sister     History  Substance Use Topics  . Smoking status: Former Games developer  . Smokeless tobacco: Not on file  . Alcohol Use: No    OB History    Grav Para Term Preterm Abortions TAB SAB Ect Mult Living                  Review of Systems  Constitutional: Positive for fatigue.  HENT: Positive for congestion.   Respiratory: Positive for cough and wheezing.     Allergies  Compazine  Home Medications   Current Outpatient Rx  Name Route Sig Dispense Refill  .  ALBUTEROL SULFATE (2.5 MG/3ML) 0.083% IN NEBU Nebulization Take 3 mLs (2.5 mg total) by nebulization every 6 (six) hours as needed. For asthma Take 2.5 mg by nebulization every 6 (six) hours as needed. For asthma 75 mL 3  . ALBUTEROL SULFATE HFA 108 (90 BASE) MCG/ACT IN AERS Inhalation Inhale 2 puffs into the lungs every 4 (four) hours as needed. 1 Inhaler 3  . CETIRIZINE HCL 10 MG PO TABS Oral Take 10 mg by mouth daily.      Marland Kitchen FLUTICASONE PROPIONATE 50 MCG/ACT NA SUSP Nasal Place 2 sprays into the nose daily. 16 g 2  . FLUTICASONE-SALMETEROL 250-50 MCG/DOSE IN AEPB  Inhale one puff every 12 hours 60 each 3  . IBUPROFEN 800 MG PO TABS Oral Take 800 mg by mouth every 8 (eight) hours as needed.      Marland Kitchen LEVOCETIRIZINE DIHYDROCHLORIDE 5 MG PO TABS Oral Take 1 tablet (5 mg total) by mouth every evening. 30 tablet 5  . MONTELUKAST SODIUM 10 MG PO TABS Oral Take 1 tablet (10 mg total) by mouth daily. 30 tablet 5  . OMEPRAZOLE 20 MG PO TBEC Oral Take by mouth daily.      Marland Kitchen PREDNISONE 50 MG PO TABS Oral Take 1 tablet (50 mg total) by mouth daily. 7 tablet 1  . PREDNISONE (PAK) 5 MG PO TABS Oral Take 1 tablet (5  mg total) by mouth as directed. Use as directed  21 tablet 0  . NEBULIZER/ADULT MASK KIT Does not apply by Does not apply route. Use as directed       BP 154/93  Pulse 88  Temp(Src) 98.8 F (37.1 C) (Oral)  Resp 24  Ht 5\' 6"  (1.676 m)  Wt 165 lb (74.844 kg)  BMI 26.63 kg/m2  SpO2 100%  Physical Exam  Nursing note and vitals reviewed. Constitutional: She is oriented to person, place, and time. She appears well-developed and well-nourished.  Non-toxic appearance.  HENT:  Head: Normocephalic.  Right Ear: Tympanic membrane and external ear normal.  Left Ear: Tympanic membrane and external ear normal.       Nasal congestion  Eyes: EOM and lids are normal. Pupils are equal, round, and reactive to light.  Neck: Normal range of motion. Neck supple. Carotid bruit is not present.    Cardiovascular: Normal rate, regular rhythm, normal heart sounds, intact distal pulses and normal pulses.   Pulmonary/Chest: No respiratory distress. She has wheezes. She has no rales.       Few soft wheezes. No focal consolidation.  Abdominal: Soft. Bowel sounds are normal. There is no tenderness. There is no guarding.  Musculoskeletal: Normal range of motion.  Lymphadenopathy:       Head (right side): No submandibular adenopathy present.       Head (left side): No submandibular adenopathy present.    She has no cervical adenopathy.  Neurological: She is alert and oriented to person, place, and time. She has normal strength. No cranial nerve deficit or sensory deficit.  Skin: Skin is warm and dry.  Psychiatric: She has a normal mood and affect. Her speech is normal.    ED Course:11:21 Pt breathing easier after continuous neb.  Pt ambulated in the hall, and became SOB and breaths sounds tight without wheeze. Will obtain blood work and discuss admission with hospitalist. 0120 - Case discussed with Dr Joneen Roach. She will see pt in ED for admission.  Procedures (including critical care time)  Labs Reviewed - No data to display Dg Chest Portable 1 View  07/23/2011  *RADIOLOGY REPORT*  Clinical Data: Severe shortness of breath, asthma attack  PORTABLE CHEST - 1 VIEW  Comparison: Portable exam 2155 hours compared to 07/20/2011  Findings: Normal heart size, mediastinal contours, and pulmonary vascularity. Mild peribronchial thickening. Lungs otherwise clear. No pleural effusion or pneumothorax. Tiny nodular density in the right mid lung is unchanged, stable since earlier study of 12/17/2010.  IMPRESSION: Minimal chronic peribronchial thickening, which can be seen with bronchitis or reactive airway disease. No acute abnormalities.  Original Report Authenticated By: Lollie Marrow, M.D.     Dx: 1. Acute asthma attack   MDM  I have reviewed nursing notes, vital signs, and all appropriate lab and  imaging results for this patient. This is the 2nd attack this week requiring ED evaluation. Pt is on a full compliment of treatment for her asthma as an outpatient. This has failed. Will discuss with hospitalist for admission.        Kathie Dike, [place] 08/05/11 [phone]

## 2011-07-24 ENCOUNTER — Encounter (HOSPITAL_COMMUNITY): Payer: Self-pay | Admitting: Family Medicine

## 2011-07-24 ENCOUNTER — Other Ambulatory Visit: Payer: Self-pay | Admitting: Family Medicine

## 2011-07-24 ENCOUNTER — Inpatient Hospital Stay (HOSPITAL_COMMUNITY): Payer: Managed Care, Other (non HMO)

## 2011-07-24 DIAGNOSIS — J45901 Unspecified asthma with (acute) exacerbation: Secondary | ICD-10-CM | POA: Diagnosis present

## 2011-07-24 DIAGNOSIS — R739 Hyperglycemia, unspecified: Secondary | ICD-10-CM | POA: Diagnosis present

## 2011-07-24 DIAGNOSIS — D649 Anemia, unspecified: Secondary | ICD-10-CM | POA: Diagnosis present

## 2011-07-24 DIAGNOSIS — E876 Hypokalemia: Secondary | ICD-10-CM | POA: Diagnosis present

## 2011-07-24 DIAGNOSIS — R911 Solitary pulmonary nodule: Secondary | ICD-10-CM

## 2011-07-24 LAB — BASIC METABOLIC PANEL
BUN: 13 mg/dL (ref 6–23)
CO2: 24 mEq/L (ref 19–32)
Calcium: 9.4 mg/dL (ref 8.4–10.5)
Chloride: 101 mEq/L (ref 96–112)
GFR calc Af Amer: >90 mL/min (ref >90)
Glucose, Bld: 144 mg/dL — ABNORMAL HIGH (ref 70–99)
Potassium: 2.8 mEq/L — ABNORMAL LOW (ref 3.5–5.1)
Sodium: 136 mEq/L (ref 135–145)

## 2011-07-24 LAB — URINE MICROSCOPIC-ADD ON

## 2011-07-24 LAB — URINALYSIS, ROUTINE W REFLEX MICROSCOPIC
Leukocytes, UA: NEGATIVE
Nitrite: NEGATIVE
Specific Gravity, Urine: 1.02 (ref 1.005–1.030)
pH: 5.5 (ref 5.0–8.0)

## 2011-07-24 LAB — CBC
Hemoglobin: 11.3 g/dL — ABNORMAL LOW (ref 12.0–15.0)
MCH: 30.8 pg (ref 26.0–34.0)
MCV: 87.2 fL (ref 78.0–100.0)
Platelets: 301 10*3/uL (ref 150–400)
Platelets: 313 10*3/uL (ref 150–400)
RBC: 3.67 MIL/uL — ABNORMAL LOW (ref 3.87–5.11)
RDW: 13 % (ref 11.5–15.5)
WBC: 11.7 10*3/uL — ABNORMAL HIGH (ref 4.0–10.5)

## 2011-07-24 LAB — GLUCOSE, CAPILLARY: Glucose-Capillary: 117 mg/dL — ABNORMAL HIGH (ref 70–99)

## 2011-07-24 MED ORDER — ALBUTEROL SULFATE (5 MG/ML) 0.5% IN NEBU: 2.5000 mg | INHALATION_SOLUTION | RESPIRATORY_TRACT | Status: DC | PRN

## 2011-07-24 MED ORDER — IPRATROPIUM BROMIDE 0.02 % IN SOLN
0.5000 mg | RESPIRATORY_TRACT | Status: DC | PRN
  Administered 2011-07-24: 0.5 mg via RESPIRATORY_TRACT
  Filled 2011-07-24: qty 2.5

## 2011-07-24 MED ORDER — MONTELUKAST SODIUM 10 MG PO TABS
10.0000 mg | ORAL_TABLET | Freq: Every day | ORAL | Status: DC
  Administered 2011-07-24 – 2011-07-25 (×2): 10 mg via ORAL
  Filled 2011-07-24 (×4): qty 1

## 2011-07-24 MED ORDER — LORATADINE 10 MG PO TABS
10.0000 mg | ORAL_TABLET | Freq: Every day | ORAL | Status: DC
  Administered 2011-07-25: 10 mg via ORAL
  Filled 2011-07-24 (×2): qty 1

## 2011-07-24 MED ORDER — ACETAMINOPHEN 650 MG RE SUPP: 650.0000 mg | Freq: Four times a day (QID) | RECTAL | Status: DC | PRN

## 2011-07-24 MED ORDER — METHYLPREDNISOLONE SODIUM SUCC 125 MG IJ SOLR
60.0000 mg | Freq: Two times a day (BID) | INTRAMUSCULAR | Status: DC
  Administered 2011-07-24: 60 mg via INTRAVENOUS
  Administered 2011-07-24: 03:00:00 via INTRAVENOUS
  Administered 2011-07-25: 60 mg via INTRAVENOUS
  Administered 2011-07-25: 02:00:00 via INTRAVENOUS
  Filled 2011-07-24 (×4): qty 2

## 2011-07-24 MED ORDER — SODIUM CHLORIDE 0.9 % IJ SOLN: 3.0000 mL | INTRAMUSCULAR | Status: DC | PRN

## 2011-07-24 MED ORDER — FLUTICASONE PROPIONATE 50 MCG/ACT NA SUSP
2.0000 | Freq: Every day | NASAL | Status: DC
  Administered 2011-07-24 – 2011-07-25 (×2): 2 via NASAL
  Filled 2011-07-24: qty 16

## 2011-07-24 MED ORDER — INSULIN ASPART 100 UNIT/ML ~~LOC~~ SOLN: 0.0000 [IU] | Freq: Every day | SUBCUTANEOUS | Status: DC

## 2011-07-24 MED ORDER — PANTOPRAZOLE SODIUM 40 MG PO TBEC
40.0000 mg | DELAYED_RELEASE_TABLET | Freq: Every day | ORAL | Status: DC
  Administered 2011-07-24: 40 mg via ORAL
  Filled 2011-07-24 (×2): qty 1

## 2011-07-24 MED ORDER — POTASSIUM CHLORIDE CRYS ER 20 MEQ PO TBCR
40.0000 meq | EXTENDED_RELEASE_TABLET | Freq: Once | ORAL | Status: AC
  Administered 2011-07-24: 40 meq via ORAL
  Filled 2011-07-24: qty 2

## 2011-07-24 MED ORDER — ONDANSETRON HCL 4 MG PO TABS: 4.0000 mg | ORAL_TABLET | Freq: Four times a day (QID) | ORAL | Status: DC | PRN

## 2011-07-24 MED ORDER — MOXIFLOXACIN HCL IN NACL 400 MG/250ML IV SOLN
INTRAVENOUS | Status: AC
  Filled 2011-07-24: qty 250

## 2011-07-24 MED ORDER — ALBUTEROL SULFATE (5 MG/ML) 0.5% IN NEBU
2.5000 mg | INHALATION_SOLUTION | Freq: Four times a day (QID) | RESPIRATORY_TRACT | Status: DC
  Administered 2011-07-24 – 2011-07-25 (×6): 2.5 mg via RESPIRATORY_TRACT
  Filled 2011-07-24 (×6): qty 0.5

## 2011-07-24 MED ORDER — POTASSIUM CHLORIDE CRYS ER 20 MEQ PO TBCR
20.0000 meq | EXTENDED_RELEASE_TABLET | Freq: Every day | ORAL | Status: DC
  Administered 2011-07-24 – 2011-07-25 (×2): 20 meq via ORAL
  Filled 2011-07-24 (×2): qty 1

## 2011-07-24 MED ORDER — ONDANSETRON HCL 4 MG/2ML IJ SOLN: 4.0000 mg | Freq: Four times a day (QID) | INTRAMUSCULAR | Status: DC | PRN

## 2011-07-24 MED ORDER — ALBUTEROL SULFATE (5 MG/ML) 0.5% IN NEBU
2.5000 mg | INHALATION_SOLUTION | RESPIRATORY_TRACT | Status: DC | PRN
  Administered 2011-07-24: 2.5 mg via RESPIRATORY_TRACT
  Filled 2011-07-24: qty 0.5

## 2011-07-24 MED ORDER — MOXIFLOXACIN HCL IN NACL 400 MG/250ML IV SOLN
400.0000 mg | INTRAVENOUS | Status: DC
  Administered 2011-07-24 – 2011-07-25 (×2): 400 mg via INTRAVENOUS
  Filled 2011-07-24 (×5): qty 250

## 2011-07-24 MED ORDER — INSULIN ASPART 100 UNIT/ML ~~LOC~~ SOLN
0.0000 [IU] | Freq: Three times a day (TID) | SUBCUTANEOUS | Status: DC
  Administered 2011-07-24: 3 [IU] via SUBCUTANEOUS
  Administered 2011-07-25: 2 [IU] via SUBCUTANEOUS
  Administered 2011-07-25: 3 [IU] via SUBCUTANEOUS
  Filled 2011-07-24: qty 3

## 2011-07-24 MED ORDER — ZOLPIDEM TARTRATE 5 MG PO TABS: 5.0000 mg | ORAL_TABLET | Freq: Every evening | ORAL | Status: DC | PRN

## 2011-07-24 MED ORDER — IPRATROPIUM BROMIDE 0.02 % IN SOLN
0.5000 mg | Freq: Four times a day (QID) | RESPIRATORY_TRACT | Status: DC
  Administered 2011-07-24 – 2011-07-25 (×6): 0.5 mg via RESPIRATORY_TRACT
  Filled 2011-07-24 (×6): qty 2.5

## 2011-07-24 MED ORDER — ENOXAPARIN SODIUM 40 MG/0.4ML ~~LOC~~ SOLN
40.0000 mg | SUBCUTANEOUS | Status: DC
  Administered 2011-07-24: 40 mg via SUBCUTANEOUS
  Filled 2011-07-24: qty 0.4

## 2011-07-24 MED ORDER — ACETAMINOPHEN 325 MG PO TABS: 650.0000 mg | ORAL_TABLET | Freq: Four times a day (QID) | ORAL | Status: DC | PRN

## 2011-07-24 NOTE — Assessment & Plan Note (Signed)
Abnormal nodule on cxr, will order ct scan for f/u and refer to pulmonary or cardiothoracic surgery if present on scan

## 2011-07-24 NOTE — Assessment & Plan Note (Signed)
Recent f;lare of symptoms with change in weather, pt to start medication

## 2011-07-24 NOTE — H&P (Addendum)
PCP:   Melody Comas, PA, PA-C   Chief Complaint:  Shortness of breath  HPI: This is a 35 y/o female with known h/o asthma, who states she normally has a flare with change of weather.  She started feeling SOB and tired Friday. She had a mild wheeze, mild nonproductive cough, her chest felt tight. By Sunday she was worse. She came to the ER, where she received nebulizer and steroid therapy. She felt better and was discharged home. She followed up with her PCP yesterday. She states she was feeling better. Her PCP noted her lungs sounded tight and prescribed steroid taper (50Mg ), advair and flonase. Today the patient stated she became again short of breath, tight, mild wheezing.  SOB was worse with ambulation. No fevers, no chills, no cough. She used her nebulizer without effect. She finally came to the ER.   She has had her pneumonia shot. She is due for her flu shot. She has never been intubated. She does not smoke. She has pets (dogs) but states allergy testing does not show an allergy to them. She reports a recently abnormal CXR, ?spot on lung.  Her PCP gave her a script to do an outpatient CT chest.  History obtained from patient, who appears reliable. She is short of breath with speech.  Review of Systems: (positives bolded) The patient denies anorexia, fever, weight loss,, vision loss, decreased hearing, hoarseness, chest pain, syncope, dyspnea on exertion, peripheral edema, balance deficits, hemoptysis, abdominal pain, melena, hematochezia, severe indigestion/heartburn, hematuria, incontinence, genital sores, muscle weakness, suspicious skin lesions, transient blindness, difficulty walking, depression, unusual weight change, abnormal bleeding, enlarged lymph nodes, angioedema, and breast masses.  Past Medical History: Past Medical History  Diagnosis Date  . Asthma   . Allergic rhinitis    Past Surgical History  Procedure Date  . Tubal ligation 1999  . Blood clot removed from lower  abdomen 2000  . Cyst removed from ovary and fluid removed from tubes 2000  . Partial hysterectomy 2009  . Abdominal hysterectomy     Medications: Prior to Admission medications   Medication Sig Start Date End Date Taking? Authorizing Provider  albuterol (PROVENTIL) (2.5 MG/3ML) 0.083% nebulizer solution Take 3 mLs (2.5 mg total) by nebulization every 6 (six) hours as needed. For asthma Take 2.5 mg by nebulization every 6 (six) hours as needed. For asthma 07/22/11  Yes Syliva Overman, MD  albuterol (VENTOLIN HFA) 108 (90 BASE) MCG/ACT inhaler Inhale 2 puffs into the lungs every 4 (four) hours as needed. 07/22/11  Yes Syliva Overman, MD  levocetirizine (XYZAL) 5 MG tablet Take 1 tablet (5 mg total) by mouth every evening. 05/02/11 05/01/12 Yes Syliva Overman, MD  montelukast (SINGULAIR) 10 MG tablet Take 1 tablet (10 mg total) by mouth daily. 05/02/11 05/01/12 Yes Syliva Overman, MD  predniSONE (DELTASONE) 50 MG tablet Take 1 tablet (50 mg total) by mouth daily. 07/20/11 07/30/11 Yes Donnetta Hutching, MD  Respiratory Therapy Supplies (NEBULIZER/ADULT MASK) KIT by Does not apply route. Use as directed    Yes Historical Provider, MD  cetirizine (ZYRTEC) 10 MG tablet Take 10 mg by mouth daily.      Historical Provider, MD  fluticasone (FLONASE) 50 MCG/ACT nasal spray Place 2 sprays into the nose daily. 07/22/11 07/21/12  Syliva Overman, MD  Fluticasone-Salmeterol (ADVAIR DISKUS) 250-50 MCG/DOSE AEPB Inhale one puff every 12 hours 07/22/11   Syliva Overman, MD  ibuprofen (ADVIL,MOTRIN) 800 MG tablet Take 800 mg by mouth every 8 (eight) hours as needed. For  pain    Historical Provider, MD  Omeprazole 20 MG TBEC Take 1 tablet by mouth daily.     Historical Provider, MD  predniSONE (STERAPRED UNI-PAK) 5 MG TABS Take 1 tablet (5 mg total) by mouth as directed. Use as directed  07/29/11 08/05/11  Syliva Overman, MD    Allergies:   Allergies  Allergen Reactions  . Compazine Other (See Comments)     Face swells, tongue sticks out    Social History:  reports that she has quit smoking. She does not have any smokeless tobacco history on file. She reports that she does not drink alcohol or use illicit drugs.  Family History: Family History  Problem Relation Age of Onset  . Asthma Mother   . Hypertension Mother   . Bronchiolitis Mother   . Hypertension Father   . Hyperlipidemia Father     recurrent rectum polyps   . Diabetes Sister   . Lupus Sister   . Depression Sister     Physical Exam: Filed Vitals:   07/23/11 2148 07/23/11 2200 07/23/11 2215 07/23/11 2230  BP:  128/72    Pulse:  98 107 111  Temp:      TempSrc:      Resp:      Height:      Weight:      SpO2: 100% 100% 100% 100%    General:  Alert and oriented times three, well developed and nourished, somewhat SOB with speech Eyes: PERRLA, pink conjunctiva, scleral icterus ENT: Moist oral mucosa, neck supple, no thyromegaly Lungs: mild wheezes appreciated, no crackles, no use of accessory muscles Cardiovascular: regular rate and rhythm, no regurgitation, no gallops, no murmurs. No carotid bruits, no JVD Abdomen: soft, positive BS, non-tender, non-distended, no organomegaly, not an acute abdomen GU: not examined Neuro: CN II - XII grossly intact, sensation intact Musculoskeletal: strength 5/5 all extremities, no clubbing, cyanosis or edema Skin: no rash, no subcutaneous crepitation, no decubitus Psych: appropriate patient   Labs on Admission:  No results found for this basename: NA:2,K:2,CL:2,CO2:2,GLUCOSE:2,BUN:2,CREATININE:2,CALCIUM:2,MG:2,PHOS:2 in the last 72 hours No results found for this basename: AST:2,ALT:2,ALKPHOS:2,BILITOT:2,PROT:2,ALBUMIN:2 in the last 72 hours No results found for this basename: LIPASE:2,AMYLASE:2 in the last 72 hours  Basename 07/23/11 2354  WBC 11.7*  NEUTROABS --  HGB 11.2*  HCT 32.1*  MCV 88.2  PLT 301   No results found for this basename:  CKTOTAL:3,CKMB:3,CKMBINDEX:3,TROPONINI:3 in the last 72 hours No results found for this basename: TSH,T4TOTAL,FREET3,T3FREE,THYROIDAB in the last 72 hours No results found for this basename: VITAMINB12:2,FOLATE:2,FERRITIN:2,TIBC:2,IRON:2,RETICCTPCT:2 in the last 72 hours  Radiological Exams on Admission: Dg Chest 2 View  07/20/2011  *RADIOLOGY REPORT*  Clinical Data: Short of breath.  Asthma.  CHEST - 2 VIEW  Comparison: 06/26/2011.  Findings: Left lung is clear. No airspace disease.  No effusion. Mild right basilar atelectasis.  Cardiopericardial silhouette within normal limits.  There is a 4 mm density projected over the right anterior fourth rib end which appears unchanged compared to 12/17/2010.  This has changed positions slightly in the prior radiographs, suggesting this is a pulmonary parenchymal read lesion rather than rib lesion.  Follow-up noncontrast chest CT recommended.  IMPRESSION:  1.  No active cardiopulmonary disease. 2.  4 mm right midlung pulmonary nodule.  Follow-up nonemergent noncontrast chest CT recommended for further assessment.  Notably, this has been stable dating back to 12/17/2010.  Original Report Authenticated By: Andreas Newport, M.D.   Dg Chest Portable 1 View  07/23/2011  *RADIOLOGY REPORT*  Clinical Data: Severe shortness of breath, asthma attack  PORTABLE CHEST - 1 VIEW  Comparison: Portable exam 2155 hours compared to 07/20/2011  Findings: Normal heart size, mediastinal contours, and pulmonary vascularity. Mild peribronchial thickening. Lungs otherwise clear. No pleural effusion or pneumothorax. Tiny nodular density in the right mid lung is unchanged, stable since earlier study of 12/17/2010.  IMPRESSION: Minimal chronic peribronchial thickening, which can be seen with bronchitis or reactive airway disease. No acute abnormalities.  Original Report Authenticated By: Lollie Marrow, M.D.    Assessment/Plan Present on Admission:  .Asthma  exacerbation -admit -solumedrol, nebulizer, oxygen -sputum culture, will add some empiric antibiotics -continue singulair -add d Dimer .GERD -continue omeprazole .Lung nodule -not evident on current CXR, will order a PA and lateral.    DVT prophylaxis Code status: full code Team 1   Jaydi Bray 07/24/2011, 1:43 AM

## 2011-07-24 NOTE — Progress Notes (Signed)
The patient is a 35 year old woman who was admitted early this morning for asthma exacerbation. The patient was briefly seen and vitals/labs were reviewed. She says that she is breathing better. She has no current complaints. Will continue current treatment, including Avelox, Solu-Medrol, bronchodilators, and supportive treatment. She does have hyperglycemia presumably from steroid treatment. We will add sliding scale NovoLog. Her serum potassium was low on admission, now, it is within normal limits following potassium chloride supplementation. We'll also order a noncontrasted CT to evaluate a lung nodule seen on chest x-ray.

## 2011-07-25 ENCOUNTER — Other Ambulatory Visit: Payer: Self-pay | Admitting: Family Medicine

## 2011-07-25 ENCOUNTER — Encounter (HOSPITAL_COMMUNITY): Payer: Self-pay | Admitting: Internal Medicine

## 2011-07-25 ENCOUNTER — Telehealth: Payer: Self-pay | Admitting: Family Medicine

## 2011-07-25 DIAGNOSIS — J45909 Unspecified asthma, uncomplicated: Secondary | ICD-10-CM

## 2011-07-25 DIAGNOSIS — D72829 Elevated white blood cell count, unspecified: Secondary | ICD-10-CM | POA: Diagnosis present

## 2011-07-25 LAB — CBC
HCT: 33.1 % — ABNORMAL LOW (ref 36.0–46.0)
Hemoglobin: 11.8 g/dL — ABNORMAL LOW (ref 12.0–15.0)
MCHC: 35.6 g/dL (ref 30.0–36.0)
MCV: 87.1 fL (ref 78.0–100.0)
RDW: 13.2 % (ref 11.5–15.5)

## 2011-07-25 LAB — BASIC METABOLIC PANEL
BUN: 12 mg/dL (ref 6–23)
CO2: 26 mEq/L (ref 19–32)
Chloride: 102 mEq/L (ref 96–112)
Creatinine, Ser: 0.79 mg/dL (ref 0.50–1.10)
GFR calc Af Amer: >90 mL/min (ref >90)
Glucose, Bld: 110 mg/dL — ABNORMAL HIGH (ref 70–99)
Potassium: 4.1 mEq/L (ref 3.5–5.1)

## 2011-07-25 LAB — GLUCOSE, CAPILLARY: Glucose-Capillary: 155 mg/dL — ABNORMAL HIGH (ref 70–99)

## 2011-07-25 MED ORDER — FLUTICASONE-SALMETEROL 250-50 MCG/DOSE IN AEPB: INHALATION_SPRAY | RESPIRATORY_TRACT | Status: DC

## 2011-07-25 MED ORDER — MOXIFLOXACIN HCL 400 MG PO TABS: 400.0000 mg | ORAL_TABLET | Freq: Every day | ORAL | Status: AC

## 2011-07-25 MED ORDER — ALBUTEROL SULFATE (2.5 MG/3ML) 0.083% IN NEBU: 2.5000 mg | INHALATION_SOLUTION | RESPIRATORY_TRACT | Status: DC

## 2011-07-25 MED ORDER — POTASSIUM CHLORIDE CRYS ER 20 MEQ PO TBCR: 20.0000 meq | EXTENDED_RELEASE_TABLET | Freq: Every day | ORAL | Status: DC

## 2011-07-25 MED ORDER — PREDNISONE (PAK) 5 MG PO TABS: 5.0000 mg | ORAL_TABLET | ORAL | Status: DC

## 2011-07-25 MED ORDER — SODIUM CHLORIDE 0.9 % IJ SOLN
INTRAMUSCULAR | Status: AC
  Administered 2011-07-25: 10 mL
  Filled 2011-07-25: qty 10

## 2011-07-25 MED ORDER — IPRATROPIUM-ALBUTEROL 0.5-2.5 (3) MG/3ML IN SOLN: 3.0000 mL | Freq: Four times a day (QID) | RESPIRATORY_TRACT | Status: DC | PRN

## 2011-07-25 NOTE — Telephone Encounter (Signed)
Patient is aware 

## 2011-07-25 NOTE — Progress Notes (Signed)
07/25/11 1315 patient left floor in stable condition via w/c accompanied by nurse about 1300. Discharged home.

## 2011-07-25 NOTE — Discharge Summary (Signed)
Physician Discharge Summary  Jodi [name] MRN: [id number] DOB/AGE: 37/05/1976 37 y.o.  PCP: Syliva Overman, MD, MD   Admit date: 07/23/2011 Discharge date: 07/25/2011  Discharge Diagnoses:  1. Asthma with exacerbation. 2. 4 mm right middle lobe calcified granuloma, per noncontrasted CT scan of the chest. 3. Steroid-induced leukocytosis. 4. Steroid-induced hyperglycemia. 5. Mild normocytic anemia in a menstruating woman. 6. Hypokalemia. 7. Gastroesophageal reflux disease. 8. Seasonal allergies.   Current Discharge Medication List    START taking these medications   Details  moxifloxacin (AVELOX) 400 MG tablet Take 1 tablet (400 mg total) by mouth daily. Antibiotic. Qty: 5 tablet, Refills: 0    potassium chloride SA (K-DUR,KLOR-CON) 20 MEQ tablet Take 1 tablet (20 mEq total) by mouth daily. Qty: 30 tablet, Refills: 0      CONTINUE these medications which have CHANGED   Details  albuterol (PROVENTIL) (2.5 MG/3ML) 0.083% nebulizer solution Take 3 mLs (2.5 mg total) by nebulization every 4 (four) hours. Use every 4 hours for the next 2 days; then every 4 hours as needed for wheezing, chest tightness, or shortness of breath. Qty: 75 mL, Refills: 3    Fluticasone-Salmeterol (ADVAIR DISKUS) 250-50 MCG/DOSE AEPB Inhale one puff every 12 hours Qty: 60 each, Refills: 3    predniSONE (STERAPRED UNI-PAK) 5 MG TABS Take 1 tablet (5 mg total) by mouth as directed. Use as directed  Qty: 21 tablet, Refills: 0   Associated Diagnoses: Unspecified asthma      CONTINUE these medications which have NOT CHANGED   Details  albuterol (VENTOLIN HFA) 108 (90 BASE) MCG/ACT inhaler Inhale 2 puffs into the lungs every 4 (four) hours as needed. Qty: 1 Inhaler, Refills: 3   Associated Diagnoses: Unspecified asthma    fluticasone (FLONASE) 50 MCG/ACT nasal spray Place 2 sprays into the nose daily. Qty: 16 g, Refills: 2   Associated Diagnoses: Allergic rhinitis, cause unspecified    levocetirizine (XYZAL) 5 MG tablet Take 1 tablet (5 mg total) by mouth every evening. Qty: 30 tablet, Refills: 5   Associated Diagnoses: Allergic rhinitis, cause unspecified    montelukast (SINGULAIR) 10 MG tablet Take 1 tablet (10 mg total) by mouth daily. Qty: 30 tablet, Refills: 5   Associated Diagnoses: Unspecified asthma    Omeprazole 20 MG TBEC Take 1 tablet by mouth daily.     Respiratory Therapy Supplies (NEBULIZER/ADULT MASK) KIT by Does not apply route. Use as directed       STOP taking these medications     cetirizine (ZYRTEC) 10 MG tablet      ibuprofen (ADVIL,MOTRIN) 800 MG tablet      predniSONE (DELTASONE) 50 MG tablet         Discharge Condition: Improved.  Disposition: Home or Self Care   Consults: None.   Significant Diagnostic Studies: Dg Chest 2 View  07/20/2011  *RADIOLOGY REPORT*  Clinical Data: Short of breath.  Asthma.  CHEST - 2 VIEW  Comparison: 06/26/2011.  Findings: Left lung is clear. No airspace disease.  No effusion. Mild right basilar atelectasis.  Cardiopericardial silhouette within normal limits.  There is a 4 mm density projected over the right anterior fourth rib end which appears unchanged compared to 12/17/2010.  This has changed positions slightly in the prior radiographs, suggesting this is a pulmonary parenchymal read lesion rather than rib lesion.  Follow-up noncontrast chest CT recommended.  IMPRESSION:  1.  No active cardiopulmonary disease. 2.  4 mm right midlung pulmonary nodule.  Follow-up nonemergent noncontrast  chest CT recommended for further assessment.  Notably, this has been stable dating back to 12/17/2010.  Original Report Authenticated By: Andreas Newport, M.D.   Ct Chest Wo Contrast  07/24/2011  *RADIOLOGY REPORT*  Clinical Data: Follow-up lung nodule.  CT CHEST WITHOUT CONTRAST  Technique:  Multidetector CT imaging of the chest was performed following the standard protocol without IV contrast.  Comparison: 07/23/2011   Findings: Within the right middle lobe, there is a small nodule which corresponds to the density seen on chest x-ray.  This measures 4 mm in greatest diameter.  On the sagittal and coronal reconstructed images, this is clearly calcified and is therefore most compatible with a calcified granuloma.  Calcifications are less clear on the axial imaging, likely related to its small size and scan planes.  Also on the same axial image (image 26) there are smaller nodules noted more anteriorly.  Small subpleural nodule seen in the left lower lobe on image 41, possibly intrapulmonary lymph node.  No pleural effusions. Heart is normal size. Aorta is normal caliber. No mediastinal, hilar, or axillary adenopathy.  Visualized thyroid and chest wall soft tissues unremarkable. Imaging into the upper abdomen shows no acute findings.  IMPRESSION: The density seen on chest x-ray represents a calcified 4 mm nodule in the right middle lobe, compatible with old granuloma.  Other adjacent smaller noncalcified nodules in the right middle lobe, also likely benign/post inflammatory. If the patient is at high risk for bronchogenic carcinoma, follow-up chest CT at 1 year is recommended.  If the patient is at low risk, no follow-up is needed.  This recommendation follows the consensus statement: Guidelines for Management of Small Pulmonary Nodules Detected on CT Scans:  A Statement from the Fleischner Society as published in Radiology 2005; 237:395-400.  Available online at: [email or web address].cz.  Original Report Authenticated By: Cyndie Chime, M.D.   Dg Chest Portable 1 View  07/23/2011  *RADIOLOGY REPORT*  Clinical Data: Severe shortness of breath, asthma attack  PORTABLE CHEST - 1 VIEW  Comparison: Portable exam 2155 hours compared to 07/20/2011  Findings: Normal heart size, mediastinal contours, and pulmonary vascularity. Mild peribronchial thickening. Lungs otherwise clear. No pleural effusion or  pneumothorax. Tiny nodular density in the right mid lung is unchanged, stable since earlier study of 12/17/2010.  IMPRESSION: Minimal chronic peribronchial thickening, which can be seen with bronchitis or reactive airway disease. No acute abnormalities.  Original Report Authenticated By: Lollie Marrow, M.D.     Microbiology: No results found for this or any previous visit (from the past 240 hour(s)).   Labs: Results for orders placed during the hospital encounter of 07/23/11 (from the past 48 hour(s))  URINALYSIS, ROUTINE W REFLEX MICROSCOPIC     Status: Abnormal   Collection Time   07/23/11 11:45 PM      Component Value Range Comment   Color, Urine STRAW (*) YELLOW     Appearance CLEAR  CLEAR     Specific Gravity, Urine 1.020  1.005 - 1.030     pH 5.5  5.0 - 8.0     Glucose, UA >1000 (*) NEGATIVE (mg/dL)    Hgb urine dipstick TRACE (*) NEGATIVE     Bilirubin Urine NEGATIVE  NEGATIVE     Ketones, ur TRACE (*) NEGATIVE (mg/dL)    Protein, ur NEGATIVE  NEGATIVE (mg/dL)    Urobilinogen, UA 0.2  0.0 - 1.0 (mg/dL)    Nitrite NEGATIVE  NEGATIVE     Leukocytes, UA NEGATIVE  NEGATIVE  URINE MICROSCOPIC-ADD ON     Status: Abnormal   Collection Time   07/23/11 11:45 PM      Component Value Range Comment   Squamous Epithelial / LPF MANY (*) RARE     WBC, UA 0-2  <3 (WBC/hpf)    RBC / HPF 0-2  <3 (RBC/hpf)    Bacteria, UA RARE  RARE    CBC     Status: Abnormal   Collection Time   07/23/11 11:54 PM      Component Value Range Comment   WBC 11.7 (*) 4.0 - 10.5 (K/uL)    RBC 3.64 (*) 3.87 - 5.11 (MIL/uL)    Hemoglobin 11.2 (*) 12.0 - 15.0 (g/dL)    HCT 16.1 (*) 09.6 - 46.0 (%)    MCV 88.2  78.0 - 100.0 (fL)    MCH 30.8  26.0 - 34.0 (pg)    MCHC 34.9  30.0 - 36.0 (g/dL)    RDW 04.5  40.9 - 81.1 (%)    Platelets 301  150 - 400 (K/uL)   BASIC METABOLIC PANEL     Status: Abnormal   Collection Time   07/23/11 11:54 PM      Component Value Range Comment   Sodium 139  135 - 145 (mEq/L)      Potassium 2.8 (*) 3.5 - 5.1 (mEq/L)    Chloride 101  96 - 112 (mEq/L)    CO2 23  19 - 32 (mEq/L)    Glucose, Bld 210 (*) 70 - 99 (mg/dL)    BUN 11  6 - 23 (mg/dL)    Creatinine, Ser 9.14  0.50 - 1.10 (mg/dL)    Calcium 9.2  8.4 - 10.5 (mg/dL)    GFR calc non Af Amer >90  >90 (mL/min)    GFR calc Af Amer >90  >90 (mL/min)   BASIC METABOLIC PANEL     Status: Abnormal   Collection Time   07/24/11  5:52 AM      Component Value Range Comment   Sodium 136  135 - 145 (mEq/L)    Potassium 4.2  3.5 - 5.1 (mEq/L) DELTA CHECK NOTED   Chloride 102  96 - 112 (mEq/L)    CO2 24  19 - 32 (mEq/L)    Glucose, Bld 144 (*) 70 - 99 (mg/dL)    BUN 13  6 - 23 (mg/dL)    Creatinine, Ser 7.82  0.50 - 1.10 (mg/dL)    Calcium 9.4  8.4 - 10.5 (mg/dL)    GFR calc non Af Amer >90  >90 (mL/min)    GFR calc Af Amer >90  >90 (mL/min)   CBC     Status: Abnormal   Collection Time   07/24/11  5:52 AM      Component Value Range Comment   WBC 13.2 (*) 4.0 - 10.5 (K/uL)    RBC 3.67 (*) 3.87 - 5.11 (MIL/uL)    Hemoglobin 11.3 (*) 12.0 - 15.0 (g/dL)    HCT 95.6 (*) 21.3 - 46.0 (%)    MCV 87.2  78.0 - 100.0 (fL)    MCH 30.8  26.0 - 34.0 (pg)    MCHC 35.3  30.0 - 36.0 (g/dL)    RDW 08.6  57.8 - 46.9 (%)    Platelets 313  150 - 400 (K/uL)   D-DIMER, QUANTITATIVE     Status: Abnormal   Collection Time   07/24/11  5:52 AM      Component Value Range Comment  D-Dimer, Quant 1.00 (*) 0.00 - 0.48 (ug/mL-FEU)   MAGNESIUM     Status: Normal   Collection Time   07/24/11  5:52 AM      Component Value Range Comment   Magnesium 2.0  1.5 - 2.5 (mg/dL)   TSH     Status: Normal   Collection Time   07/24/11  9:30 AM      Component Value Range Comment   TSH 0.608  0.350 - 4.500 (uIU/mL) Please note change in reference range for ages 66W to 59Y.   GLUCOSE, CAPILLARY     Status: Abnormal   Collection Time   07/24/11 10:56 AM      Component Value Range Comment   Glucose-Capillary 117 (*) 70 - 99 (mg/dL)   GLUCOSE,  CAPILLARY     Status: Abnormal   Collection Time   07/24/11  4:05 PM      Component Value Range Comment   Glucose-Capillary 193 (*) 70 - 99 (mg/dL)   GLUCOSE, CAPILLARY     Status: Abnormal   Collection Time   07/24/11 10:40 PM      Component Value Range Comment   Glucose-Capillary 148 (*) 70 - 99 (mg/dL)    Comment 1 Notify RN     BASIC METABOLIC PANEL     Status: Abnormal   Collection Time   07/25/11  5:02 AM      Component Value Range Comment   Sodium 137  135 - 145 (mEq/L)    Potassium 4.1  3.5 - 5.1 (mEq/L)    Chloride 102  96 - 112 (mEq/L)    CO2 26  19 - 32 (mEq/L)    Glucose, Bld 110 (*) 70 - 99 (mg/dL)    BUN 12  6 - 23 (mg/dL)    Creatinine, Ser 1.61  0.50 - 1.10 (mg/dL)    Calcium 9.1  8.4 - 10.5 (mg/dL)    GFR calc non Af Amer >90  >90 (mL/min)    GFR calc Af Amer >90  >90 (mL/min)   CBC     Status: Abnormal   Collection Time   07/25/11  5:02 AM      Component Value Range Comment   WBC 21.4 (*) 4.0 - 10.5 (K/uL)    RBC 3.80 (*) 3.87 - 5.11 (MIL/uL)    Hemoglobin 11.8 (*) 12.0 - 15.0 (g/dL)    HCT 09.6 (*) 04.5 - 46.0 (%)    MCV 87.1  78.0 - 100.0 (fL)    MCH 31.1  26.0 - 34.0 (pg)    MCHC 35.6  30.0 - 36.0 (g/dL)    RDW 40.9  81.1 - 91.4 (%)    Platelets 344  150 - 400 (K/uL)   GLUCOSE, CAPILLARY     Status: Abnormal   Collection Time   07/25/11  7:31 AM      Component Value Range Comment   Glucose-Capillary 128 (*) 70 - 99 (mg/dL)    Comment 1 Notify RN      Comment 2 Documented in Chart        HPI : The patient is a 35 year old woman with a past medical history significant for asthma and allergic rhinitis, who presented to the emergency department on 07/23/2011 with a chief complaint of shortness of breath. She had been started on treatment in the outpatient setting by her primary care physician with Advair, Flonase, and a steroid taper. Before that, she presented to the emergency department with shortness of breath as well. She  was treated with several  bronchodilators and discharged on a prednisone taper. She initially felt better, but later, her symptoms persisted with chest tightness, wheezing, and shortness of breath. She, therefore, presented again for treatment. In the emergency department, she was noted to be afebrile and hemodynamically stable although she was appropriately tachycardic after several albuterol treatments. She was short of breath with speaking. Her oxygen saturation was 100% on supplemental oxygen. Her white blood cell count was slightly elevated at 11.7. Her chest x-ray revealed minimal chronic peribronchial thickening and a tiny right mid lung density unchanged, but no acute abnormalities otherwise. She was admitted for further evaluation and management.  HOSPITAL COURSE: Following several albuterol nebulizations, she was admitted for further management. She was continued on albuterol nebulizations every 6 hours initially. However the frequency was decreased to every 4 hours. She was also started on Atrovent nebulizations every 4 hours. Steroid therapy was initiated with Solu-Medrol. For treatment of a probable superimposed infectious bronchitis, Avelox was started at 400 mg intravenously. Oxygen was applied and titrated to keep her oxygen saturations greater than 90%. She was also maintained on an antihistamine, Singulair, and Flonase.  The patient's serum potassium was 2.8 on admission. She was started on potassium chloride supplementation orally. A blood magnesium level was assessed, and it was within normal limits at 2.0. Her venous glucose was noted to be elevated, secondary to steroid treatment. She has no history of diabetes mellitus. Sliding scale NovoLog was given before each meal and at bedtime. Her white blood cell count was slightly elevated on admission. And with ongoing intravenous Solu-Medrol, her white blood cell count increased to 21.4 prior to discharge. She remained completely afebrile, and therefore, the leukocytosis  was felt to be steroid induced. Her hemoglobin ranged from 11.2-11.8, likely secondary to chronic blood loss from menstrual bleeding. Her thyroid function was assessed. Her TSH was within normal limits at 0.608.  She was noted to have a small right middle lobe nodule on her chest x-ray. Apparently, this has been present in the past. Her primary care physician had planned on an outpatient CT scan for evaluation. Since she was here, I decided to go ahead and order a noncontrasted CT for evaluation. The results are indicated above. However, in essence, it appeared that the patient had a stable small right middle lobe (probable) granuloma. The patient no longer smokes; she stopped smoking one year ago. She is at low risk for lung cancer given her age. Therefore, serial CT scans are probably not needed.  The patient became less symptomatic. She had no bronchospasms on exam. With some activity, she was still a little short of breath, but significantly improved. She was oxygenating in the upper 90s on room air. She was discharged to home on medications as above. She was advised to continue the prednisone taper that Dr. Lodema Hong had started. She was advised to continue antibiotic therapy and bronchodilator therapy as above. She was instructed to not go back to work until next week or per the discretion of Dr. Lodema Hong following her evaluation.    Discharge Exam: Blood pressure 132/87, pulse 83, temperature 98.8 F (37.1 C), temperature source Oral, resp. rate 18, height 5\' 6"  (1.676 m), weight 73.6 kg (162 lb 4.1 oz), SpO2 98.00%. Lungs: Mostly clear to auscultation with occasional faint wheezes in the bases. Breathing is nonlabored at rest. Heart: S1, S2, with no murmurs rubs or gallops. Abdomen: Positive bowel sounds, soft, nondistended, nontender. Extremities: Pedal pulses palpable. No pretibial edema and no  pedal edema.    Discharge Orders    Future Appointments: Provider: Department: Dept Phone:  Center:   07/30/2011 8:15 AM Syliva Overman, MD [facility] [phone] [facility]   11/05/2011 9:00 AM Syliva Overman, MD [facility] [phone] [facility]     Future Orders Please Complete By Expires   Diet Carb Modified      Increase activity slowly      Discharge instructions      Comments:   DO NOT RETURN TO WORK UNTIL Thursday 0CTOBER 18TH  OR PER THE RECOMMENDATIONS BY YOUR PRIMARY DOCTOR.      Follow-up Information    Follow up with Syliva Overman, MD on 07/29/2011. (AT 8:15 AM)    Contact information:   [address] [place] [address] [phone]          Signed: Finn Altemose 07/25/2011, 10:37 AM

## 2011-07-25 NOTE — Progress Notes (Signed)
07/25/11 1145 patient being discharged home. IV site d/c'd, site within normal limits. Reviewed discharge instructions with patient, given copy of instructions, med list, prescriptions, asthma education, f/u appointment in place. Verbalized understanding of instructions, denies pain or discomfort at this time. O2 on r/a. Pt in stable condition awaiting discharge home.

## 2011-07-25 NOTE — Telephone Encounter (Signed)
i personally sent them all, pls let her know

## 2011-07-28 ENCOUNTER — Encounter: Payer: Self-pay | Admitting: Family Medicine

## 2011-07-28 ENCOUNTER — Encounter: Payer: Self-pay | Admitting: *Deleted

## 2011-07-28 ENCOUNTER — Other Ambulatory Visit (HOSPITAL_COMMUNITY): Payer: Self-pay

## 2011-07-28 ENCOUNTER — Ambulatory Visit (INDEPENDENT_AMBULATORY_CARE_PROVIDER_SITE_OTHER): Payer: Managed Care, Other (non HMO) | Admitting: Family Medicine

## 2011-07-28 VITALS — BP 120/84 | HR 90 | Resp 16 | Ht 66.0 in | Wt 162.0 lb

## 2011-07-28 DIAGNOSIS — R918 Other nonspecific abnormal finding of lung field: Secondary | ICD-10-CM

## 2011-07-28 DIAGNOSIS — T50905A Adverse effect of unspecified drugs, medicaments and biological substances, initial encounter: Secondary | ICD-10-CM

## 2011-07-28 DIAGNOSIS — J45901 Unspecified asthma with (acute) exacerbation: Secondary | ICD-10-CM

## 2011-07-28 DIAGNOSIS — J302 Other seasonal allergic rhinitis: Secondary | ICD-10-CM

## 2011-07-28 DIAGNOSIS — J8410 Pulmonary fibrosis, unspecified: Secondary | ICD-10-CM

## 2011-07-28 DIAGNOSIS — J45909 Unspecified asthma, uncomplicated: Secondary | ICD-10-CM

## 2011-07-28 DIAGNOSIS — R7309 Other abnormal glucose: Secondary | ICD-10-CM

## 2011-07-28 DIAGNOSIS — R739 Hyperglycemia, unspecified: Secondary | ICD-10-CM

## 2011-07-28 DIAGNOSIS — D72829 Elevated white blood cell count, unspecified: Secondary | ICD-10-CM

## 2011-07-28 DIAGNOSIS — J309 Allergic rhinitis, unspecified: Secondary | ICD-10-CM

## 2011-07-28 DIAGNOSIS — E876 Hypokalemia: Secondary | ICD-10-CM

## 2011-07-28 NOTE — Assessment & Plan Note (Signed)
Acute flare , requiring 1 Ed visit, 1 hospitalization and 2 office visits. Pt educated about the daily use of appropriate dose of prophylactic inhaler

## 2011-07-28 NOTE — Assessment & Plan Note (Signed)
H/o past nicotine use rept scan in 1 year

## 2011-07-28 NOTE — Patient Instructions (Addendum)
F/u in 3 months.  Work excuse 10/10 to return 08/04/2011.  It is vital you take medication as prescribed  Pls call if not improving, you will be referred to a lung specialist  You need rept lab work to [facility] your white cell count and potassium in 5 weeks, you will get an order for this.Pls FAST , drink water only for 12 hours before test. Your blood sugar will be re evaluated also  A repeat non contrast chest CT is recommended to f/u the lung nodule

## 2011-07-28 NOTE — Assessment & Plan Note (Signed)
Fasting chem 7 in 5 to 6 weeks

## 2011-07-28 NOTE — Assessment & Plan Note (Signed)
Uncontrolled recent flare , work excuse from 10/10 to return 10/22

## 2011-07-28 NOTE — Assessment & Plan Note (Signed)
rept cbc and diff in 5 weeks

## 2011-07-28 NOTE — Assessment & Plan Note (Signed)
Uncontrolled with flare, pt to take maintenance therapy daily

## 2011-07-28 NOTE — Progress Notes (Signed)
  Subjective:    Patient ID: Jodi [name], female    DOB: [birth year], 35 y.o.   MRN: [id number]  HPI Pt was in the ed 2 weeks ago, then the office last Tuesday, and hospitalized last Wednesday, when she did not go to work, and was hospitalized till last Friday with asthma flare. Still reports exertional fatigue with minimal activity. Not taking advair as prescribed, she is re educated about the need to do this. Completing antibiotic course. During hospitalization she developed transient hyperglycemia , primarily related to steroids prescribed ,and was noted to be hypokalemic. She is a former smoker, quit 1 year ago, has  a pulmonary nodule which will need f/u in 1 year.   Review of Systems See HPI Denies recent fever or chills. Denies sinus pressure,does have some  nasal congestionpost nasal drainage, and mild loss of hoarseness,denies  ear pain or sore throat.  Denies chest pains, palpitations and leg swelling Denies abdominal pain, nausea, vomiting,diarrhea or constipation.   Denies dysuria, frequency, hesitancy or incontinence. Denies joint pain, swelling and limitation in mobility.  Denies depression,appears to have mild  Anxiety denies r insomnia. Denies skin break down or rash.        Objective:   Physical Exam Patient alert and oriented and in no cardiopulmonary distress.  HEENT: No facial asymmetry, EOMI, no sinus tenderness,  oropharynx pink and moist.  Neck supple no adenopathy.  Chest: Decreased air entry throughout scattered wheezes , no crackles CVS: S1, S2 no murmurs, no S3.  ABD: Soft non tender. Bowel sounds normal.  Ext: No edema  MS: Adequate ROM spine, shoulders, hips and knees.  Skin: Intact, no ulcerations or rash noted.  Psych: Good eye contact, normal affect. Memory intact not  depressed appearing.mldly anxious  CNS: CN 2-12 intact, power, tone and sensation normal throughout.        Assessment & Plan:

## 2011-08-01 MED ORDER — FLUTICASONE-SALMETEROL 250-50 MCG/DOSE IN AEPB: INHALATION_SPRAY | RESPIRATORY_TRACT | Status: DC

## 2011-08-01 MED ORDER — PREDNISONE (PAK) 5 MG PO TABS: 5.0000 mg | ORAL_TABLET | ORAL | Status: AC

## 2011-08-01 NOTE — Telephone Encounter (Signed)
Patient already recieved

## 2011-08-06 NOTE — ED Provider Notes (Signed)
Medical screening examination/treatment/procedure(s) were conducted as a shared visit with non-physician practitioner(s) and myself.  I personally evaluated the patient during the encounter Devoria Albe, MD, FACEP See Prior note  Ward [name], MD 08/06/11 [phone]

## 2011-09-08 ENCOUNTER — Other Ambulatory Visit: Payer: Self-pay | Admitting: Family Medicine

## 2011-09-08 LAB — CBC WITH DIFFERENTIAL/PLATELET
Basophils Absolute: 0 10*3/uL (ref 0.0–0.1)
Eosinophils Absolute: 0.1 10*3/uL (ref 0.0–0.7)
Eosinophils Relative: 1 % (ref 0–5)
Lymphocytes Relative: 18 % (ref 12–46)
MCH: 29.9 pg (ref 26.0–34.0)
MCV: 88.4 fL (ref 78.0–100.0)
Neutrophils Relative %: 72 % (ref 43–77)
Platelets: 289 10*3/uL (ref 150–400)
RBC: 3.98 MIL/uL (ref 3.87–5.11)
RDW: 13.2 % (ref 11.5–15.5)
WBC: 8.1 10*3/uL (ref 4.0–10.5)

## 2011-09-08 LAB — BASIC METABOLIC PANEL
Calcium: 9.8 mg/dL (ref 8.4–10.5)
Creat: 0.8 mg/dL (ref 0.50–1.10)
Sodium: 140 mEq/L (ref 135–145)

## 2011-09-17 ENCOUNTER — Encounter: Payer: Self-pay | Admitting: Family Medicine

## 2011-09-18 ENCOUNTER — Telehealth: Payer: Self-pay | Admitting: Family Medicine

## 2011-09-18 ENCOUNTER — Encounter: Payer: Self-pay | Admitting: Family Medicine

## 2011-09-18 ENCOUNTER — Ambulatory Visit (INDEPENDENT_AMBULATORY_CARE_PROVIDER_SITE_OTHER): Payer: Managed Care, Other (non HMO) | Admitting: Family Medicine

## 2011-09-18 DIAGNOSIS — K219 Gastro-esophageal reflux disease without esophagitis: Secondary | ICD-10-CM

## 2011-09-18 DIAGNOSIS — J309 Allergic rhinitis, unspecified: Secondary | ICD-10-CM | POA: Insufficient documentation

## 2011-09-18 DIAGNOSIS — J45909 Unspecified asthma, uncomplicated: Secondary | ICD-10-CM

## 2011-09-18 MED ORDER — CHLORPHENIRAMINE-HYDROCODONE 8-10 MG/5ML PO LQCR: 5.0000 mL | Freq: Two times a day (BID) | ORAL | Status: DC | PRN

## 2011-09-18 MED ORDER — PREDNISONE (PAK) 5 MG PO TABS: 5.0000 mg | ORAL_TABLET | ORAL | Status: DC

## 2011-09-18 NOTE — Telephone Encounter (Signed)
Spoke with pt and informed her that prescription has been faxed to the pharmacy, [facility] in [place]

## 2011-09-18 NOTE — Patient Instructions (Addendum)
F/u in  4 months.  You are being treated for uncontrolled allergies, prednisone dose pack  And cough suppressant are prescribed.  Take sudafed one tablet daily, as needed for excess post nasal drainage  Get better soon so you can hunt!  Allergies, Generic Allergies may happen from anything your body is sensitive to. This may be food, medicines, pollens, chemicals, and nearly anything around you in everyday life that produces allergens. An allergen is anything that causes an allergy producing substance. Heredity is often a factor in causing these problems. This means you may have some of the same allergies as your parents. Food allergies happen in all age groups. Food allergies are some of the most severe and life threatening. Some common food allergies are cow's milk, seafood, eggs, nuts, wheat, and soybeans. SYMPTOMS   Swelling around the mouth.   An itchy red rash or hives.   Vomiting or diarrhea.   Difficulty breathing.  SEVERE ALLERGIC REACTIONS ARE LIFE-THREATENING. This reaction is called anaphylaxis. It can cause the mouth and throat to swell and cause difficulty with breathing and swallowing. In severe reactions only a trace amount of food (for example, peanut oil in a salad) may cause death within seconds. Seasonal allergies occur in all age groups. These are seasonal because they usually occur during the same season every year. They may be a reaction to molds, grass pollens, or tree pollens. Other causes of problems are house dust mite allergens, pet dander, and mold spores. The symptoms often consist of nasal congestion, a runny itchy nose associated with sneezing, and tearing itchy eyes. There is often an associated itching of the mouth and ears. The problems happen when you come in contact with pollens and other allergens. Allergens are the particles in the air that the body reacts to with an allergic reaction. This causes you to release allergic antibodies. Through a chain of  events, these eventually cause you to release histamine into the blood stream. Although it is meant to be protective to the body, it is this release that causes your discomfort. This is why you were given anti-histamines to feel better. If you are unable to pinpoint the offending allergen, it may be determined by skin or blood testing. Allergies cannot be cured but can be controlled with medicine. Hay fever is a collection of all or some of the seasonal allergy problems. It may often be treated with simple over-the-counter medicine such as diphenhydramine. Take medicine as directed. Do not drink alcohol or drive while taking this medicine. Check with your caregiver or package insert for child dosages. If these medicines are not effective, there are many new medicines your caregiver can prescribe. Stronger medicine such as nasal spray, eye drops, and corticosteroids may be used if the first things you try do not work well. Other treatments such as immunotherapy or desensitizing injections can be used if all else fails. Follow up with your caregiver if problems continue. These seasonal allergies are usually not life threatening. They are generally more of a nuisance that can often be handled using medicine. HOME CARE INSTRUCTIONS   If unsure what causes a reaction, keep a diary of foods eaten and symptoms that follow. Avoid foods that cause reactions.   If hives or rash are present:   Take medicine as directed.   You may use an over-the-counter antihistamine (diphenhydramine) for hives and itching as needed.   Apply cold compresses (cloths) to the skin or take baths in cool water. Avoid hot baths or  showers. Heat will make a rash and itching worse.   If you are severely allergic:   Following a treatment for a severe reaction, hospitalization is often required for closer follow-up.   Wear a medic-alert bracelet or necklace stating the allergy.   You and your family must learn how to give  adrenaline or use an anaphylaxis kit.   If you have had a severe reaction, always carry your anaphylaxis kit or EpiPen with you. Use this medicine as directed by your caregiver if a severe reaction is occurring. Failure to do so could have a fatal outcome.  SEEK MEDICAL CARE IF:  You suspect a food allergy. Symptoms generally happen within 30 minutes of eating a food.   Your symptoms have not gone away within 2 days or are getting worse.   You develop new symptoms.   You want to retest yourself or your child with a food or drink you think causes an allergic reaction. Never do this if an anaphylactic reaction to that food or drink has happened before. Only do this under the care of a caregiver.  SEEK IMMEDIATE MEDICAL CARE IF:   You have difficulty breathing, are wheezing, or have a tight feeling in your chest or throat.   You have a swollen mouth, or you have hives, swelling, or itching all over your body.   You have had a severe reaction that has responded to your anaphylaxis kit or an EpiPen. These reactions may return when the medicine has worn off. These reactions should be considered life threatening.  MAKE SURE YOU:   Understand these instructions.   Will watch your condition.   Will get help right away if you are not doing well or get worse.  Document Released: 12/23/2002 Document Revised: 06/11/2011 Document Reviewed: 05/29/2008 [facility] Patient Information 2012 [place], [place].

## 2011-09-18 NOTE — Progress Notes (Signed)
  Subjective:    Patient ID: Jodi [name], female    DOB: 12-06-1975, 35 y.o.   MRN: [id number]  HPI 10 days ago pt seen in urgent care dx with ROM , prescribed  Omnicef, states this is better, but 2 days ago has noted increased cough dry, post nasal drainage and loss of voice. No fever or chills    Review of Systems See HPI  Denies chest congestion, productive cough or wheezing. Denies chest pains, palpitations and leg swelling Denies abdominal pain, nausea, vomiting,diarrhea or constipation.   Denies dysuria, frequency, hesitancy or incontinence. Denies joint pain, swelling and limitation in mobility. Denies headaches, seizures, numbness, or tingling. Denies depression, anxiety or insomnia. Denies skin break down or rash.        Objective:   Physical Exam Patient alert and oriented and in no cardiopulmonary distress.  HEENT: No facial asymmetry, EOMI, no sinus tenderness,  oropharynx pink and moist.  Neck supple no adenopathy.  Chest: Clear to auscultation bilaterally.  CVS: S1, S2 no murmurs, no S3.  ABD: Soft non tender. Bowel sounds normal.  Ext: No edema  MS: Adequate ROM spine, shoulders, hips and knees.  Skin: Intact, no ulcerations or rash noted.  Psych: Good eye contact, normal affect. Memory intact not anxious or depressed appearing.  CNS: CN 2-12 intact, power, tone and sensation normal throughout.        Assessment & Plan:

## 2011-09-20 NOTE — Assessment & Plan Note (Signed)
Stable at this time 

## 2011-09-20 NOTE — Assessment & Plan Note (Signed)
Uncontrolled, with cough med prescribed

## 2011-09-20 NOTE — Assessment & Plan Note (Signed)
Controlled, no change in medication  

## 2011-09-28 ENCOUNTER — Emergency Department (HOSPITAL_COMMUNITY)
Admission: EM | Admit: 2011-09-28 | Discharge: 2011-09-28 | Disposition: A | Discharge status: Home / Self Care | Payer: Managed Care, Other (non HMO) | Attending: Emergency Medicine | Admitting: Emergency Medicine

## 2011-09-28 ENCOUNTER — Encounter (HOSPITAL_COMMUNITY): Payer: Self-pay

## 2011-09-28 DIAGNOSIS — J45909 Unspecified asthma, uncomplicated: Secondary | ICD-10-CM | POA: Insufficient documentation

## 2011-09-28 DIAGNOSIS — Z79899 Other long term (current) drug therapy: Secondary | ICD-10-CM | POA: Insufficient documentation

## 2011-09-28 DIAGNOSIS — R350 Frequency of micturition: Secondary | ICD-10-CM | POA: Insufficient documentation

## 2011-09-28 DIAGNOSIS — R3915 Urgency of urination: Secondary | ICD-10-CM | POA: Insufficient documentation

## 2011-09-28 DIAGNOSIS — N390 Urinary tract infection, site not specified: Secondary | ICD-10-CM

## 2011-09-28 DIAGNOSIS — R109 Unspecified abdominal pain: Secondary | ICD-10-CM | POA: Insufficient documentation

## 2011-09-28 DIAGNOSIS — R319 Hematuria, unspecified: Secondary | ICD-10-CM | POA: Insufficient documentation

## 2011-09-28 DIAGNOSIS — R300 Dysuria: Secondary | ICD-10-CM | POA: Insufficient documentation

## 2011-09-28 LAB — URINE MICROSCOPIC-ADD ON

## 2011-09-28 LAB — URINALYSIS, ROUTINE W REFLEX MICROSCOPIC
Nitrite: NEGATIVE
Specific Gravity, Urine: 1.01 (ref 1.005–1.030)
Urobilinogen, UA: 0.2 mg/dL (ref 0.0–1.0)
pH: 6 (ref 5.0–8.0)

## 2011-09-28 MED ORDER — SULFAMETHOXAZOLE-TRIMETHOPRIM 800-160 MG PO TABS: 1.0000 | ORAL_TABLET | Freq: Two times a day (BID) | ORAL | Status: AC

## 2011-09-28 MED ORDER — SULFAMETHOXAZOLE-TMP DS 800-160 MG PO TABS
1.0000 | ORAL_TABLET | Freq: Once | ORAL | Status: AC
  Administered 2011-09-28: 1 via ORAL
  Filled 2011-09-28: qty 1

## 2011-09-28 NOTE — ED Notes (Signed)
Pt presents with right sided flank pain, increased urinary urgency, hematuria, and nausea since Friday. NAD at this time.

## 2011-09-28 NOTE — ED Provider Notes (Signed)
History     CSN: [id number] Arrival date & time: 09/28/2011  5:10 PM   First MD Initiated Contact with Patient 09/28/11 1714      Chief Complaint  Patient presents with  . Urinary Urgency  . Hematuria  . Flank Pain    (Consider location/radiation/quality/duration/timing/severity/associated sxs/prior treatment) HPI  Patient with uti symptoms of frequency of urination, discoloration, pain at end of voiding, some discomfort right side.  History of uti with similar symptoms.  Nausea, no vomiting, fever, or chills.  Symptoms present for two days.  History of utis with similar symptoms last treated for same at least six months.  LMP -patient had partial hysterectomy.    Past Medical History  Diagnosis Date  . Asthma   . Allergic rhinitis   . 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 Tubal ligation 1999  . Blood clot removed from lower abdomen 2000  . Cyst removed from ovary and fluid removed from tubes 2000  . Partial hysterectomy 2009  . Abdominal hysterectomy     Family History  Problem Relation Age of Onset  . Asthma Mother   . Hypertension Mother   . Bronchiolitis Mother   . Hypertension Father   . Hyperlipidemia Father     recurrent rectum polyps   . Diabetes Sister   . Lupus Sister   . Depression Sister     History  Substance Use Topics  . Smoking status: Former Games developer  . Smokeless tobacco: Not on file  . Alcohol Use: No    OB History    Grav Para Term Preterm Abortions TAB SAB Ect Mult Living                  Review of Systems  All other systems reviewed and are negative.    Allergies  Compazine  Home Medications   Current Outpatient Rx  Name Route Sig Dispense Refill  . ALBUTEROL SULFATE (2.5 MG/3ML) 0.083% IN NEBU Nebulization Take 3 mLs (2.5 mg total) by nebulization every 4 (four) hours. Use every 4 hours for the next 2 days; then every 4 hours as needed for wheezing, chest tightness, or  shortness of breath. 75 mL 3  . ALBUTEROL SULFATE HFA 108 (90 BASE) MCG/ACT IN AERS Inhalation Inhale 2 puffs into the lungs every 4 (four) hours as needed. 1 Inhaler 3  . CEFDINIR 300 MG PO CAPS Oral Take 300 mg by mouth daily.      . CHLORPHENIRAMINE-HYDROCODONE 8-10 MG/5ML PO LQCR Oral Take 5 mLs by mouth every 12 (twelve) hours as needed for cough. 240 mL 0  . VICKS NYQUIL COUGH PO Oral Take by mouth. AS DIRECTED\      . FLUTICASONE PROPIONATE 50 MCG/ACT NA SUSP Nasal Place 2 sprays into the nose daily. 16 g 2  . FLUTICASONE-SALMETEROL 250-50 MCG/DOSE IN AEPB  Inhale one puff every 12 hours 60 each 3  . IPRATROPIUM-ALBUTEROL 0.5-2.5 (3) MG/3ML IN SOLN Nebulization Take 3 mLs by nebulization every 6 (six) hours as needed. 360 mL 5  . LEVOCETIRIZINE DIHYDROCHLORIDE 5 MG PO TABS Oral Take 1 tablet (5 mg total) by mouth every evening. 30 tablet 5  . MONTELUKAST SODIUM 10 MG PO TABS Oral Take 1 tablet (10 mg total) by mouth daily. 30 tablet 5  . OMEPRAZOLE 20 MG PO TBEC Oral Take 1 tablet by mouth daily.     Marland Kitchen PREDNISONE (PAK) 5 MG PO  TABS Oral Take 1 tablet (5 mg total) by mouth as directed. 21 tablet 0  . NEBULIZER/ADULT MASK KIT Does not apply by Does not apply route. Use as directed       BP 124/73  Pulse 96  Temp(Src) 98.2 F (36.8 C) (Oral)  Resp 16  Ht 5\' 6"  (1.676 m)  Wt 167 lb (75.751 kg)  BMI 26.95 kg/m2  SpO2 100%  Physical Exam  Nursing note and vitals reviewed. Constitutional: She is oriented to person, place, and time. She appears well-developed and well-nourished.  HENT:  Head: Normocephalic and atraumatic.  Eyes: Conjunctivae and EOM are normal. Pupils are equal, round, and reactive to light.  Neck: Normal range of motion. Neck supple.  Cardiovascular: Normal rate, regular rhythm, normal heart sounds and intact distal pulses.   Pulmonary/Chest: Effort normal and breath sounds normal.  Abdominal: Soft. Bowel sounds are normal.  Genitourinary:       Mild right cva  tenderness  Musculoskeletal: Normal range of motion.  Neurological: She is alert and oriented to person, place, and time.  Skin: Skin is warm and dry.    ED Course  Procedures (including critical care time)  Labs Reviewed  URINALYSIS, ROUTINE W REFLEX MICROSCOPIC - Abnormal; Notable for the following:    APPearance CLOUDY (*)    Hgb urine dipstick MODERATE (*)    Leukocytes, UA LARGE (*)    All other components within normal limits  URINE MICROSCOPIC-ADD ON - Abnormal; Notable for the following:    Bacteria, UA MANY (*)    All other components within normal limits   No results found.   No diagnosis found.    MDM   Results for orders placed during the hospital encounter of 09/28/11  URINALYSIS, ROUTINE W REFLEX MICROSCOPIC      Component Value Range   Color, Urine YELLOW  YELLOW    APPearance CLOUDY (*) CLEAR    Specific Gravity, Urine 1.010  1.005 - 1.030    pH 6.0  5.0 - 8.0    Glucose, UA NEGATIVE  NEGATIVE (mg/dL)   Hgb urine dipstick MODERATE (*) NEGATIVE    Bilirubin Urine NEGATIVE  NEGATIVE    Ketones, ur NEGATIVE  NEGATIVE (mg/dL)   Protein, ur NEGATIVE  NEGATIVE (mg/dL)   Urobilinogen, UA 0.2  0.0 - 1.0 (mg/dL)   Nitrite NEGATIVE  NEGATIVE    Leukocytes, UA LARGE (*) NEGATIVE   URINE MICROSCOPIC-ADD ON      Component Value Range   WBC, UA TOO NUMEROUS TO COUNT  <3 (WBC/hpf)   RBC / HPF 11-20  <3 (RBC/hpf)   Bacteria, UA MANY (*) RARE            Hilario Quarry, MD 09/28/11 1743

## 2011-10-12 ENCOUNTER — Other Ambulatory Visit: Payer: Self-pay | Admitting: Family Medicine

## 2011-10-21 ENCOUNTER — Other Ambulatory Visit: Payer: Self-pay | Admitting: Family Medicine

## 2011-11-04 ENCOUNTER — Other Ambulatory Visit: Payer: Self-pay | Admitting: Family Medicine

## 2011-11-05 ENCOUNTER — Ambulatory Visit: Payer: Managed Care, Other (non HMO) | Admitting: Family Medicine

## 2011-11-18 ENCOUNTER — Other Ambulatory Visit: Payer: Self-pay | Admitting: Family Medicine

## 2011-11-20 ENCOUNTER — Ambulatory Visit (INDEPENDENT_AMBULATORY_CARE_PROVIDER_SITE_OTHER): Payer: Managed Care, Other (non HMO) | Admitting: Family Medicine

## 2011-11-20 ENCOUNTER — Encounter: Payer: Self-pay | Admitting: Family Medicine

## 2011-11-20 VITALS — BP 132/78 | HR 91 | Resp 18 | Ht 66.0 in | Wt 167.0 lb

## 2011-11-20 DIAGNOSIS — J309 Allergic rhinitis, unspecified: Secondary | ICD-10-CM

## 2011-11-20 DIAGNOSIS — J45909 Unspecified asthma, uncomplicated: Secondary | ICD-10-CM

## 2011-11-20 MED ORDER — ALBUTEROL SULFATE HFA 108 (90 BASE) MCG/ACT IN AERS: 2.0000 | INHALATION_SPRAY | RESPIRATORY_TRACT | Status: DC | PRN

## 2011-11-20 MED ORDER — MONTELUKAST SODIUM 10 MG PO TABS: 10.0000 mg | ORAL_TABLET | Freq: Every day | ORAL | Status: DC

## 2011-11-20 MED ORDER — FLUTICASONE-SALMETEROL 250-50 MCG/DOSE IN AEPB: INHALATION_SPRAY | RESPIRATORY_TRACT | Status: DC

## 2011-11-20 MED ORDER — METHYLPREDNISOLONE ACETATE 80 MG/ML IJ SUSP
80.0000 mg | Freq: Once | INTRAMUSCULAR | Status: AC
  Administered 2011-11-20: 80 mg via INTRAMUSCULAR

## 2011-11-20 MED ORDER — PREDNISONE (PAK) 5 MG PO TABS: 5.0000 mg | ORAL_TABLET | ORAL | Status: DC

## 2011-11-20 MED ORDER — FLUTICASONE PROPIONATE 50 MCG/ACT NA SUSP: 2.0000 | Freq: Every day | NASAL | Status: DC

## 2011-11-20 MED ORDER — LEVOCETIRIZINE DIHYDROCHLORIDE 5 MG PO TABS: 5.0000 mg | ORAL_TABLET | Freq: Every evening | ORAL | Status: DC

## 2011-11-20 NOTE — Patient Instructions (Signed)
F/u in 4 months. Pls call if you need me before You are treated for a flare of your asthma. It is vital you take alll preventive prescription medications every day. You will get depo medrol iM in the office and a prednisone dose pack is prescribed. I hope you feel better soon

## 2011-11-20 NOTE — Assessment & Plan Note (Signed)
Uncontrolled, flonase needs to be resumed

## 2011-11-20 NOTE — Progress Notes (Signed)
  Subjective:    Patient ID: Jodi [name], female    DOB: 1976-09-17, 36 y.o.   MRN: [id number]  HPI 3 day h/o increased cough and chest tightness with wheezing, has been doing nebs twice daily for the last 2 days, still working. Denies fever, chills , sinus pressure or drainage.c/o hoarseness, which often flares up with asthma. Pt has been out of flonase, she has noted increased post nasal drainage and tickle in the past 3 days   Review of Systems See HPI Denies chest pains, palpitations and leg swelling Denies abdominal pain, nausea, vomiting,diarrhea or constipation.   Denies dysuria, frequency, hesitancy or incontinence. Denies joint pain, swelling and limitation in mobility. Denies headaches, seizures, numbness, or tingling. Denies depression, anxiety or insomnia. Denies skin break down or rash.        Objective:   Physical Exam Patient alert and oriented and in no cardiopulmonary distress.  HEENT: No facial asymmetry, EOMI, no sinus tenderness,  oropharynx pink and moist.  Neck supple no adenopathy.  Chest: decreased though adequate, air entry, bilateral wheeze, no crackles  CVS: S1, S2 no murmurs, no S3.  ABD: Soft non tender. Bowel sounds normal.  Ext: No edema  Psych: Good eye contact, normal affect. Memory intact not anxious or depressed appearing.  CNS: CN 2-12 intact, power, tone and sensation normal throughout.        Assessment & Plan:

## 2011-11-20 NOTE — Assessment & Plan Note (Signed)
3 day flare, depomedrol and dose pack, pt needs to use flonase daily, advised to get FMLA also

## 2011-11-28 ENCOUNTER — Encounter: Payer: Self-pay | Admitting: Family Medicine

## 2011-11-28 ENCOUNTER — Ambulatory Visit (INDEPENDENT_AMBULATORY_CARE_PROVIDER_SITE_OTHER): Payer: Managed Care, Other (non HMO) | Admitting: Family Medicine

## 2011-11-28 VITALS — BP 140/80 | HR 98 | Temp 98.7°F | Resp 18 | Ht 66.0 in | Wt 166.1 lb

## 2011-11-28 DIAGNOSIS — J45901 Unspecified asthma with (acute) exacerbation: Secondary | ICD-10-CM

## 2011-11-28 DIAGNOSIS — J40 Bronchitis, not specified as acute or chronic: Secondary | ICD-10-CM

## 2011-11-28 MED ORDER — AZITHROMYCIN 500 MG PO TABS: 500.0000 mg | ORAL_TABLET | Freq: Every day | ORAL | Status: AC

## 2011-11-28 MED ORDER — PREDNISONE 10 MG PO TABS: ORAL_TABLET | ORAL | Status: DC

## 2011-11-28 NOTE — Patient Instructions (Signed)
Take the antibiotics as prescribed Take the steroids as prescribed  Bring by the [facility] papers Use your inhalers or nebulizer every 4 hours as needed New machine sent to [facility]  If you have worsening difficulty breathing or chest pain please go to nearest ER

## 2011-11-28 NOTE — Progress Notes (Signed)
  Subjective:    Patient ID: Jodi [name], female    DOB: [birth year], 36 y.o.   MRN: [id number]  HPI Patient presents with worsening asthma and illness. She was treated one week ago for asthma exacerbation with Sterapred dosepak. The patient returned to work last week and had an asthma attack and had to be triaged by EMS after given a breathing treatment she returned home where she improved. She return to work on Tuesday began having worsening symptoms of cough, congestion and postnasal drip. She denies any fever. Has been using her nebulizer every 4 hours as needed.  No recent antibiotics. Taking Nyquil  She does work around a lot of dust which triggers her symptoms Review of Systems   GEN- + fatigue, denies fever, weight loss,weakness,+ recent illness HEENT- denies eye drainage, change in vision,+ nasal discharge, CVS- denies chest pain, palpitations RESP-+SOB,+ cough,+ wheeze ABD- denies N/V, change in stools, abd pain MSK- denies joint pain, muscle aches, injury Neuro- denies headache, dizziness, syncope, seizure activity      Objective:   Physical Exam GEN- NAD, alert and oriented x3 HEENT- PERRL, EOMI, non injected sclera, pink conjunctiva, MMM, oropharynx injected, TM clear bilat, nares clear rhinorrhea- edematous turbinates Neck- Supple, +cervical LAD CVS- RRR, no murmur RESP-Clear to ascultation, with decreased BS at bases, no wheeze EXT- No edema Pulses- Radial, DP- 2+        Assessment & Plan:

## 2011-11-30 DIAGNOSIS — J40 Bronchitis, not specified as acute or chronic: Secondary | ICD-10-CM | POA: Insufficient documentation

## 2011-11-30 NOTE — Assessment & Plan Note (Signed)
Recent asthma exacerbation in setting of acute illness. Script sent for portable nebulizer Antibiotics to cover bronchitis for second illness

## 2011-11-30 NOTE — Assessment & Plan Note (Signed)
Tapering course of steroids. Out of work until [employer], to prevent worsening of symptoms in work conditions

## 2011-12-27 ENCOUNTER — Emergency Department (HOSPITAL_COMMUNITY)
Admission: EM | Admit: 2011-12-27 | Discharge: 2011-12-28 | Disposition: A | Discharge status: Home / Self Care | Payer: Managed Care, Other (non HMO) | Attending: Emergency Medicine | Admitting: Emergency Medicine

## 2011-12-27 ENCOUNTER — Encounter (HOSPITAL_COMMUNITY): Payer: Self-pay

## 2011-12-27 ENCOUNTER — Other Ambulatory Visit: Payer: Self-pay

## 2011-12-27 DIAGNOSIS — Z9079 Acquired absence of other genital organ(s): Secondary | ICD-10-CM | POA: Insufficient documentation

## 2011-12-27 DIAGNOSIS — Z9851 Tubal ligation status: Secondary | ICD-10-CM | POA: Insufficient documentation

## 2011-12-27 DIAGNOSIS — Z87891 Personal history of nicotine dependence: Secondary | ICD-10-CM | POA: Insufficient documentation

## 2011-12-27 DIAGNOSIS — J8410 Pulmonary fibrosis, unspecified: Secondary | ICD-10-CM | POA: Insufficient documentation

## 2011-12-27 DIAGNOSIS — J45909 Unspecified asthma, uncomplicated: Secondary | ICD-10-CM | POA: Insufficient documentation

## 2011-12-27 DIAGNOSIS — J310 Chronic rhinitis: Secondary | ICD-10-CM | POA: Insufficient documentation

## 2011-12-27 MED ORDER — PREDNISONE 20 MG PO TABS
60.0000 mg | ORAL_TABLET | Freq: Once | ORAL | Status: AC
  Administered 2011-12-28: 60 mg via ORAL
  Filled 2011-12-27: qty 3

## 2011-12-27 MED ORDER — PREDNISONE 10 MG PO TABS: 20.0000 mg | ORAL_TABLET | Freq: Every day | ORAL | Status: AC

## 2011-12-27 MED ORDER — ALBUTEROL SULFATE (5 MG/ML) 0.5% IN NEBU
2.5000 mg | INHALATION_SOLUTION | Freq: Once | RESPIRATORY_TRACT | Status: AC
  Administered 2011-12-27: 2.5 mg via RESPIRATORY_TRACT
  Filled 2011-12-27: qty 0.5

## 2011-12-27 MED ORDER — IPRATROPIUM BROMIDE 0.02 % IN SOLN
0.5000 mg | Freq: Once | RESPIRATORY_TRACT | Status: AC
  Administered 2011-12-27: 0.5 mg via RESPIRATORY_TRACT
  Filled 2011-12-27: qty 2.5

## 2011-12-27 NOTE — Discharge Instructions (Signed)
Continue to use her home medications. Insure nebulizer 4 times a day for the next 4 days and then as needed for wheezing. Take the prednisone. Follow up with your doctor.

## 2011-12-27 NOTE — ED Provider Notes (Addendum)
History     CSN: [id number]  Arrival date & time 12/27/11  2229   First MD Initiated Contact with Patient 12/27/11 2258      Chief Complaint  Patient presents with  . Shortness of Breath  . Asthma  . Chest Pain    (Consider location/radiation/quality/duration/timing/severity/associated sxs/prior treatment) HPI  Jodi [name] is a 36 y.o. female who presents to the Emergency Department complaining of  Wheezing and shortness of breath that began today. She has used her nebulizer at home with little relief. Last treatment was at 8 PM. She denies fever, chills, cough.   PCP Dr. Lodema Hong  Past Medical History  Diagnosis Date  . Asthma   . Allergic rhinitis   . 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 Tubal ligation 1999  . Blood clot removed from lower abdomen 2000  . Cyst removed from ovary and fluid removed from tubes 2000  . Partial hysterectomy 2009  . Abdominal hysterectomy     Family History  Problem Relation Age of Onset  . Asthma Mother   . Hypertension Mother   . Bronchiolitis Mother   . Hypertension Father   . Hyperlipidemia Father     recurrent rectum polyps   . Diabetes Sister   . Lupus Sister   . Depression Sister     History  Substance Use Topics  . Smoking status: Former Games developer  . Smokeless tobacco: Not on file  . Alcohol Use: No    OB History    Grav Para Term Preterm Abortions TAB SAB Ect Mult Living                  Review of Systems  Constitutional: Negative for fever.       10 Systems reviewed and are negative for acute change except as noted in the HPI.  HENT: Negative for congestion.   Eyes: Negative for discharge and redness.  Respiratory: Positive for shortness of breath and wheezing. Negative for cough.   Cardiovascular: Negative for chest pain.  Gastrointestinal: Negative for vomiting and abdominal pain.  Musculoskeletal: Negative for back pain.  Skin: Negative for rash.    Neurological: Negative for syncope, numbness and headaches.  Psychiatric/Behavioral:       No behavior change.    Allergies  Compazine  Home Medications   Current Outpatient Rx  Name Route Sig Dispense Refill  . ALBUTEROL SULFATE (2.5 MG/3ML) 0.083% IN NEBU Nebulization Take 3 mLs (2.5 mg total) by nebulization every 4 (four) hours. Use every 4 hours for the next 2 days; then every 4 hours as needed for wheezing, chest tightness, or shortness of breath. 75 mL 3  . ALBUTEROL SULFATE HFA 108 (90 BASE) MCG/ACT IN AERS Inhalation Inhale 2 puffs into the lungs every 4 (four) hours as needed. 1 Inhaler 3  . NYQUIL COLD & FLU PO Oral Take by mouth.    Marland Kitchen FLUTICASONE PROPIONATE 50 MCG/ACT NA SUSP Nasal Place 2 sprays into the nose daily. 16 g 4  . FLUTICASONE-SALMETEROL 250-50 MCG/DOSE IN AEPB  Inhale one puff every 12 hours 60 each 4  . IPRATROPIUM-ALBUTEROL 0.5-2.5 (3) MG/3ML IN SOLN Nebulization Take 3 mLs by nebulization every 6 (six) hours as needed. 360 mL 5  . LEVOCETIRIZINE DIHYDROCHLORIDE 5 MG PO TABS Oral Take 1 tablet (5 mg total) by mouth every evening. 30 tablet 4  . MONTELUKAST SODIUM 10 MG PO TABS Oral Take  1 tablet (10 mg total) by mouth at bedtime. 30 tablet 4  . PREDNISONE 10 MG PO TABS  Take 40mg  x 3 days, then 30mg  x 3 days, then 20mg  x 2 days, then 10 mg x 2 days then stop -QS 26 tablet 0  . NEBULIZER/ADULT MASK KIT Does not apply by Does not apply route. Use as directed       BP 132/86  Pulse 91  Temp(Src) 98.1 F (36.7 C) (Oral)  Resp 18  Ht 5\' 6"  (1.676 m)  Wt 170 lb (77.111 kg)  BMI 27.44 kg/m2  SpO2 100%  Physical Exam  Nursing note and vitals reviewed. Constitutional:       Awake, alert, nontoxic appearance.  HENT:  Head: Atraumatic.  Eyes: Right eye exhibits no discharge. Left eye exhibits no discharge.  Neck: Neck supple.  Pulmonary/Chest: Effort normal. No respiratory distress. She has wheezes. She exhibits no tenderness.       Good air movement.   Abdominal: Soft. There is no tenderness. There is no rebound.  Musculoskeletal: She exhibits no tenderness.       Baseline ROM, no obvious new focal weakness.  Neurological:       Mental status and motor strength appears baseline for patient and situation.  Skin: No rash noted.  Psychiatric: She has a normal mood and affect.    ED Course  Procedures (including critical care time)  Date: 12/28/2011  2244  Rate: 89  Rhythm: normal sinus rhythm  QRS Axis: normal  Intervals: normal  ST/T Wave abnormalities: normal  Conduction Disutrbances: none  Narrative Interpretation: unremarkable       MDM  Patient with a history of asthma here with shortness of breath. Given albuterol, Atrovent, prednisone with improvement .  Pt feels improved after observation and/or treatment in ED.Pt stable in ED with no significant deterioration in condition.The patient appears reasonably screened and/or stabilized for discharge and I doubt any other medical condition or other [facility] requiring further screening, evaluation, or treatment in the ED at this time prior to discharge.  MDM Reviewed: nursing note and vitals           Nicoletta Dress. Colon Branch, MD 12/28/11 0005  Nicoletta Dress. Colon Branch, MD 12/28/11 1610

## 2011-12-27 NOTE — ED Notes (Signed)
Pt presents with SOB and chest pressure. Pt has Hx of Asthma. Pt states it feels like an elephant is sitting on her chest. Pt with shallow resp in triage. Pt states it hurts to take deep breath.

## 2012-01-07 ENCOUNTER — Ambulatory Visit (INDEPENDENT_AMBULATORY_CARE_PROVIDER_SITE_OTHER): Payer: Managed Care, Other (non HMO) | Admitting: Family Medicine

## 2012-01-07 ENCOUNTER — Encounter: Payer: Self-pay | Admitting: Family Medicine

## 2012-01-07 VITALS — BP 122/84 | HR 105 | Temp 99.0°F | Resp 15 | Ht 66.0 in | Wt 171.4 lb

## 2012-01-07 DIAGNOSIS — J45909 Unspecified asthma, uncomplicated: Secondary | ICD-10-CM

## 2012-01-07 DIAGNOSIS — J40 Bronchitis, not specified as acute or chronic: Secondary | ICD-10-CM

## 2012-01-07 DIAGNOSIS — J019 Acute sinusitis, unspecified: Secondary | ICD-10-CM

## 2012-01-07 DIAGNOSIS — J309 Allergic rhinitis, unspecified: Secondary | ICD-10-CM

## 2012-01-07 MED ORDER — METHYLPREDNISOLONE ACETATE 80 MG/ML IJ SUSP
80.0000 mg | Freq: Once | INTRAMUSCULAR | Status: AC
  Administered 2012-01-07: 80 mg via INTRAMUSCULAR

## 2012-01-07 MED ORDER — PREDNISONE 10 MG PO TABS: ORAL_TABLET | ORAL | Status: DC

## 2012-01-07 MED ORDER — AZITHROMYCIN 250 MG PO TABS: ORAL_TABLET | ORAL | Status: AC

## 2012-01-07 MED ORDER — FLUCONAZOLE 150 MG PO TABS: ORAL_TABLET | ORAL | Status: AC

## 2012-01-07 NOTE — Patient Instructions (Signed)
F/u as before.  You are being treated for uncontrolled allergic sinusitis , rhinitis and acute right maxillary sinusitis.   Depo medrol 80mg  Im in the office and 3 medications are sent to your pharmacy.  Please start twice daily nasal/sinus  flushes with saline solution, we will provide a "recipe " for this to be made at home , if you do not chose to buy oTC saline flush.netty pot

## 2012-01-12 ENCOUNTER — Telehealth: Payer: Self-pay | Admitting: Family Medicine

## 2012-01-12 NOTE — Telephone Encounter (Signed)
Left message to try Robitussin DM

## 2012-01-19 ENCOUNTER — Ambulatory Visit: Payer: Managed Care, Other (non HMO) | Admitting: Family Medicine

## 2012-01-19 NOTE — Progress Notes (Signed)
  Subjective:    Patient ID: Jodi [name], female    DOB: Jan 01, 1976, 36 y.o.   MRN: [id number]  HPI 3 day h/o excessive sinus pressure with yellow nasal drainage , low grade fever and chills. Denies excessive cough or wheezing. C/o excessive nasal congestion with difficulty breathing   Review of Systems See HPI Denies chest pains, palpitations and leg swelling Denies abdominal pain, nausea, vomiting,diarrhea or constipation.   Denies dysuria, frequency, hesitancy or incontinence. Denies joint pain, swelling and limitation in mobility. Denies headaches, seizures, numbness, or tingling. Denies depression, anxiety or insomnia. Denies skin break down or rash.        Objective:   Physical Exam Patient alert and oriented and in no cardiopulmonary distress.  HEENT: No facial asymmetry, EOMI, maxillary sinus tenderness,  oropharynx pink and moist.  Neck supple no adenopathy.Nasal mucosa erytheamtous and edematous  Chest: Clear to auscultation bilaterally.  CVS: S1, S2 no murmurs, no S3.  ABD: Soft non tender. Bowel sounds normal.  Ext: No edema  MS: Adequate ROM spine, shoulders, hips and knees.  Skin: Intact, no ulcerations or rash noted.  Psych: Good eye contact, normal affect. Memory intact not anxious or depressed appearing.  CNS: CN 2-12 intact, power, tone and sensation normal throughout.        Assessment & Plan:

## 2012-01-19 NOTE — Assessment & Plan Note (Signed)
Antibiotic course prescribed 

## 2012-01-19 NOTE — Assessment & Plan Note (Signed)
--   currently stable  ?

## 2012-01-19 NOTE — Assessment & Plan Note (Signed)
Uncontrolled with acute flare, aggressive anti inflammatories and nasal flushes

## 2012-03-18 ENCOUNTER — Encounter: Payer: Self-pay | Admitting: Family Medicine

## 2012-03-18 ENCOUNTER — Ambulatory Visit (INDEPENDENT_AMBULATORY_CARE_PROVIDER_SITE_OTHER): Payer: Managed Care, Other (non HMO) | Admitting: Family Medicine

## 2012-03-18 VITALS — BP 120/84 | HR 72 | Resp 16 | Ht 66.0 in | Wt 168.1 lb

## 2012-03-18 DIAGNOSIS — K219 Gastro-esophageal reflux disease without esophagitis: Secondary | ICD-10-CM

## 2012-03-18 DIAGNOSIS — J45909 Unspecified asthma, uncomplicated: Secondary | ICD-10-CM

## 2012-03-18 DIAGNOSIS — J309 Allergic rhinitis, unspecified: Secondary | ICD-10-CM

## 2012-03-18 DIAGNOSIS — Z139 Encounter for screening, unspecified: Secondary | ICD-10-CM

## 2012-03-18 DIAGNOSIS — R5383 Other fatigue: Secondary | ICD-10-CM

## 2012-03-18 DIAGNOSIS — E663 Overweight: Secondary | ICD-10-CM

## 2012-03-18 DIAGNOSIS — R5381 Other malaise: Secondary | ICD-10-CM

## 2012-03-18 MED ORDER — LEVOCETIRIZINE DIHYDROCHLORIDE 5 MG PO TABS: 5.0000 mg | ORAL_TABLET | Freq: Every evening | ORAL | Status: DC

## 2012-03-18 MED ORDER — MONTELUKAST SODIUM 10 MG PO TABS: 10.0000 mg | ORAL_TABLET | Freq: Every day | ORAL | Status: DC

## 2012-03-18 MED ORDER — BUDESONIDE-FORMOTEROL FUMARATE 160-4.5 MCG/ACT IN AERO: 2.0000 | INHALATION_SPRAY | Freq: Two times a day (BID) | RESPIRATORY_TRACT | Status: DC

## 2012-03-18 NOTE — Patient Instructions (Signed)
F/U in 4.5  month   Trial of symbicort for asthma instead of advair   If the reflux symptoms improve then stay on symbicort, if they continue call so you can be started  On reflux medication  It is important that you exercise regularly at least 30 minutes 5 times a week. If you develop chest pain, have severe difficulty breathing, or feel very tired, stop exercising immediately and seek medical attention    A healthy diet is rich in fruit, vegetables and whole grains. Poultry fish, nuts and beans are a healthy choice for protein rather then red meat. A low sodium diet and drinking 64 ounces of water daily is generally recommended. Oils and sweet should be limited. Carbohydrates especially for those who are diabetic or overweight, should be limited to 30-45 gram per meal. It is important to eat on a regular schedule, at least 3 times daily. Snacks should be primarily fruits, vegetables or nuts.  CbC, fasting chem 7, lipid, tsh and vit D in 4.5 month before visit  Weight loss goal of 5 to 8 pounds

## 2012-03-18 NOTE — Progress Notes (Signed)
  Subjective:    Patient ID: Jodi [name], female    DOB: [birth year], 36 y.o.   MRN: [id number]  HPI The PT is here for follow up and re-evaluation of chronic medical conditions, medication management and review of any available recent lab and radiology data.  Preventive health is updated, specifically  Cancer screening and Immunization.   Questions or concerns regarding consultations or procedures which the PT has had in the interim are  addressed. The PT denies any adverse reactions to current medications since the last visit.  C/o increased GRERD symptoms with advair, states this does not occur when she does not use it      Review of Systems See HPI Denies recent fever or chills. Denies sinus pressure, nasal congestion, ear pain or sore throat. Denies chest congestion, productive cough or wheezing. Denies chest pains, palpitations and leg swelling Denies  vomiting,diarrhea or constipation.   Denies dysuria, frequency, hesitancy or incontinence. Denies joint pain, swelling and limitation in mobility. Denies headaches, seizures, numbness, or tingling. Denies depression, anxiety or insomnia. Denies skin break down or rash.        Objective:   Physical Exam  Patient alert and oriented and in no cardiopulmonary distress.  HEENT: No facial asymmetry, EOMI, no sinus tenderness,  oropharynx pink and moist.  Neck supple no adenopathy.  Chest: Clear to auscultation bilaterally.Adequate air entry throughout  CVS: S1, S2 no murmurs, no S3.  ABD: Soft non tender. Bowel sounds normal.  Ext: No edema  MS: Adequate ROM spine, shoulders, hips and knees.  Skin: Intact, no ulcerations or rash noted.  Psych: Good eye contact, normal affect. Memory intact not anxious or depressed appearing.  CNS: CN 2-12 intact, power, tone and sensation normal throughout.       Assessment & Plan:

## 2012-03-20 NOTE — Assessment & Plan Note (Signed)
Controlled, however, pt reports GERD with advair, will try symbicort

## 2012-03-20 NOTE — Assessment & Plan Note (Signed)
Uncontrolled, and pt thinks may be due to advair will see if med change makes a difference. Lifestyle changes to control gERD discussed

## 2012-03-20 NOTE — Assessment & Plan Note (Signed)
Currently stable and controlled on no regular medication

## 2012-03-20 NOTE — Assessment & Plan Note (Signed)
Unchanged. Patient re-educated about  the importance of commitment to a  minimum of 150 minutes of exercise per week. The importance of healthy food choices with portion control discussed. Encouraged to start a food diary, count calories and to consider  joining a support group. Sample diet sheets offered. Goals set by the patient for the next several months.    

## 2012-03-23 ENCOUNTER — Other Ambulatory Visit (HOSPITAL_COMMUNITY): Payer: Self-pay | Admitting: Orthopaedic Surgery

## 2012-03-23 DIAGNOSIS — M25561 Pain in right knee: Secondary | ICD-10-CM

## 2012-03-24 ENCOUNTER — Emergency Department (HOSPITAL_COMMUNITY): Payer: Managed Care, Other (non HMO)

## 2012-03-24 ENCOUNTER — Emergency Department (HOSPITAL_COMMUNITY)
Admission: EM | Admit: 2012-03-24 | Discharge: 2012-03-24 | Disposition: A | Discharge status: Home / Self Care | Payer: Managed Care, Other (non HMO) | Attending: Emergency Medicine | Admitting: Emergency Medicine

## 2012-03-24 ENCOUNTER — Encounter (HOSPITAL_COMMUNITY): Payer: Self-pay | Admitting: *Deleted

## 2012-03-24 DIAGNOSIS — R071 Chest pain on breathing: Secondary | ICD-10-CM | POA: Insufficient documentation

## 2012-03-24 DIAGNOSIS — R0789 Other chest pain: Secondary | ICD-10-CM

## 2012-03-24 DIAGNOSIS — R0602 Shortness of breath: Secondary | ICD-10-CM | POA: Insufficient documentation

## 2012-03-24 DIAGNOSIS — R079 Chest pain, unspecified: Secondary | ICD-10-CM | POA: Insufficient documentation

## 2012-03-24 DIAGNOSIS — J45901 Unspecified asthma with (acute) exacerbation: Secondary | ICD-10-CM

## 2012-03-24 DIAGNOSIS — J45909 Unspecified asthma, uncomplicated: Secondary | ICD-10-CM | POA: Insufficient documentation

## 2012-03-24 LAB — POCT I-STAT TROPONIN I: Troponin i, poc: 0 ng/mL (ref 0.00–0.08)

## 2012-03-24 LAB — D-DIMER, QUANTITATIVE: D-Dimer, Quant: 0.9 ug/mL-FEU — ABNORMAL HIGH (ref 0.00–0.48)

## 2012-03-24 MED ORDER — ALBUTEROL SULFATE (5 MG/ML) 0.5% IN NEBU
10.0000 mg | INHALATION_SOLUTION | Freq: Once | RESPIRATORY_TRACT | Status: AC
  Administered 2012-03-24: 10 mg via RESPIRATORY_TRACT
  Filled 2012-03-24: qty 2

## 2012-03-24 MED ORDER — HYDROCODONE-ACETAMINOPHEN 5-325 MG PO TABS: ORAL_TABLET | ORAL | Status: AC

## 2012-03-24 MED ORDER — IPRATROPIUM BROMIDE 0.02 % IN SOLN
0.5000 mg | Freq: Once | RESPIRATORY_TRACT | Status: AC
  Administered 2012-03-24: 0.5 mg via RESPIRATORY_TRACT
  Filled 2012-03-24: qty 2.5

## 2012-03-24 MED ORDER — PREDNISONE 20 MG PO TABS
60.0000 mg | ORAL_TABLET | Freq: Once | ORAL | Status: AC
  Administered 2012-03-24: 60 mg via ORAL
  Filled 2012-03-24: qty 3

## 2012-03-24 MED ORDER — PREDNISONE 20 MG PO TABS: 40.0000 mg | ORAL_TABLET | Freq: Every day | ORAL | Status: AC

## 2012-03-24 MED ORDER — IOHEXOL 350 MG/ML SOLN
100.0000 mL | Freq: Once | INTRAVENOUS | Status: AC | PRN
  Administered 2012-03-24: 100 mL via INTRAVENOUS

## 2012-03-24 MED ORDER — ACETAMINOPHEN 500 MG PO TABS
1000.0000 mg | ORAL_TABLET | Freq: Once | ORAL | Status: AC
  Administered 2012-03-24: 1000 mg via ORAL
  Filled 2012-03-24: qty 2

## 2012-03-24 MED ORDER — ALBUTEROL SULFATE (5 MG/ML) 0.5% IN NEBU
5.0000 mg | INHALATION_SOLUTION | Freq: Once | RESPIRATORY_TRACT | Status: AC
  Administered 2012-03-24: 5 mg via RESPIRATORY_TRACT
  Filled 2012-03-24: qty 1

## 2012-03-24 MED ORDER — IPRATROPIUM BROMIDE 0.02 % IN SOLN
1.0000 mg | Freq: Once | RESPIRATORY_TRACT | Status: AC
  Administered 2012-03-24: 1 mg via RESPIRATORY_TRACT
  Filled 2012-03-24: qty 5

## 2012-03-24 NOTE — ED Notes (Signed)
Patient to CT via stretcher.

## 2012-03-24 NOTE — Progress Notes (Signed)
Can hear breathe sounds bbs sl dim patient say she is breathing better sats 100% hr 92

## 2012-03-24 NOTE — ED Notes (Signed)
Remains resting sitting up in bed. Lung sounds remain slightly diminished. Denies any needs other than pain medication for 6\10 pain. Call bell within reach. Bed in low position and locked with side rails up. Family at bedside. Will continue to monitor. 98% on continuous neb.

## 2012-03-24 NOTE — ED Notes (Signed)
Into room to see patient. Resting sitting up in bed. Remains on continuous neb. Pain 6\10 in chest. Would like something for it. Denies any other needs. Call bell within reach. Will continue to monitor.

## 2012-03-24 NOTE — ED Notes (Signed)
Right sided cp, worse with slouching, and sob that started a few hours ago.  Denies n/v/dizziness.

## 2012-03-24 NOTE — Progress Notes (Signed)
PATIENT NEEDED TO GO TO THE BATHROOM PATIENT WENT AND PLACED BACK ON CONTINOUS NEB TREATMENT SATS 100% HR 90

## 2012-03-24 NOTE — Discharge Instructions (Signed)
RESOURCE GUIDE  Chronic Pain Problems: Contact [facility]  [phone] Patients need to be referred by their primary care doctor.  Insufficient Money for Medicine: Contact United Way:  call "211" or [facility] [phone].  No Primary Care Doctor: - Call [facility]  [phone] - can help you locate a primary care doctor that  accepts your insurance, provides certain services, etc. - Physician Referral Service- 1-800-533-3463  Agencies that provide inexpensive medical care: - [facility]  [phone] - [facility]  [phone] - [facility]  [phone] - [facility] Clinic  [phone] - Planned Parenthood  [phone] - [facility]  [phone]  Medicaid-accepting [place] Providers: - [facility]- [address]  [phone], Mon-Fri 9am-7pm, Sat 9am-1pm - [facility]- [address], [address]  [phone] - [facility]- [address], [address]  [phone] - [facility]- [address] [place] [address]  [phone] - Veita B[facility]- [address], [phone]  Only accepts [place] Access Medicaid patients after they have their name  applied to their card  Self Pay (no insurance) in [place]: - Sickle Cell Patients: Jodi [name], [facility]  [address], [phone] - [facility]- [address]  [phone]       -     [facility]- [address]       -     [facility]- see information above (Speak to Pam H if you do not have insurance)       -  [facility]- [address], [phone]       -  [facility] [place]- [address],  [phone]       -  [facility]- [address] [place] [address], [phone]       -  [facility]-  [address], [address], [place], [phone]       -  [facility]- [address], [phone]       -  [facility]- [address] [place] [address], [phone], also [address], [phone]       -    [facility]- [address], [phone], 1st & 3rd Saturday   every month, 10am-1pm  1) Find a Doctor and Pay Out of Pocket Although you won't have to find out who is covered by your insurance plan, it is a good idea to ask around and get recommendations. You will then need to call the office and see if the doctor you have chosen will accept you as a new patient and what types of options they offer for patients who are self-pay. Some doctors offer discounts or will set up payment plans for their patients who do not have insurance, but you will need to ask so you aren't surprised when you get to your appointment.  2) Contact Your Local Health Department Not all health departments have doctors that can see patients for sick visits, but many do, so it is worth a call to see if yours does. If you don't know where your local health department is, you can check in your phone book. The CDC also has a tool to help you locate your state's health department, and many state websites also have   listings of all of their local health departments.  3) Find a Walk-in Clinic If your illness is not likely to be very severe or complicated, you may want to try a walk in clinic. These are popping up all over the country in pharmacies, drugstores, and shopping centers. They're usually staffed by nurse practitioners or physician assistants that have been trained to treat common illnesses and complaints. They're usually fairly quick and inexpensive. However, if you have serious medical issues or chronic medical problems, these are probably not your best option  STD Testing - [place] [facility] [place], [facility] Clinic, [address], [place], phone [phone] or [phone].  Monday - Friday, call for an appointment. - [place]  [facility] [place], [facility]D Clinic, [address], [place], phone [phone] or [phone].  Monday - Friday, call for an appointment.  Abuse/Neglect: - [place] Child Abuse Hotline (336) 641-3795 - [place] Child Abuse Hotline 800-378-5315 (After Hours)  Emergency Shelter:  [facility] (336) 271-5985  Maternity Homes: - [facility] (336) 275-9566 - [facility] (704) 372-4663  MRSA Hotline #:   832-7006  [place] Resources  [facility] [place]  [employer] [place] [facility] [address]                 [address]         [address]  [place]                                               [place]                              [place] Phone:  [phone]                                  Phone:  [phone]                   Phone:  [phone]  [place] [facility], [phone] - [place] Services - [facility]- 1-888-581-9988       -     [facility] in [place], [address],                                  336-349-4454, Insurance  [place] Child Abuse Hotline (336) 342-1394 or (336) 342-3537 (After Hours)   Behavioral Health Services  Substance Abuse Resources: - [facility]  336-882-2125 - [facility] 336-784-9470 - [facility] 336-285-9073 - [facility] 336-845-3988 - Residential & Outpatient Substance Abuse Program  800-659-3381  Psychological Services: -  Health  [phone] - [facility]  [phone] - [place] [facility], [address], [place], ACCESS LINE: 1-800-853-5163 or 336-641-4981, Http://www.[facility].com/services/adult.htm  Dental Assistance  If unable to pay or uninsured, contact:  [facility] or [place] [facility] to become qualified for the adult dental  clinic.  Patients with Medicaid:  [facility] [address], [phone], [phone]  If unable   to pay, or uninsured, contact [facility] [phone]) or [facility] Department [phone] in [address], [phone] in [facility]) to become qualified for the adult dental clinic  Other Low-Cost Community Dental Services: - [facility]- [address] [place], [place], [place], [address], [phone], [address]. [address], 2nd and 4th Thursday of the month at 6:30am.  10 clients each day by appointment, can sometimes see walk-in patients if someone does not show for an appointment. [facility]- [address] [place], [place], [address], [phone] - [facility]- [address] [place], [place], [address], [phone] [facility]- [phone] [facility] Health Department- [phone] [facility] [phone]     Take the prescriptions as directed.  Apply moist heat or ice to the area(s) of discomfort, for 15 minutes at a time, several times per day for the next few days.  Do not fall asleep on a heating or ice pack.  Use your albuterol inhaler (2 to 4 puffs) or your albuterol nebulizer (1 unit dose) every 4 hours for the next 7 days, then as needed for cough, wheezing, or shortness of breath.  Call your regular medical doctor tomorrow morning to schedule a follow up appointment within the next 3 to 4 days.  Return to the Emergency Department immediately sooner if worsening.

## 2012-03-24 NOTE — ED Notes (Signed)
Resting sitting up in bed. Denies needs at this time. Call bell within reach. Bed in low position and locked with side rails up. Will continue to monitor.

## 2012-03-24 NOTE — ED Notes (Signed)
Ambulatory to bathroom at this time. Denies needs. No distress. Pain 6\10 in right chest. Call bell within reach. Family at bedside.

## 2012-03-24 NOTE — ED Notes (Signed)
Resting sitting up in bed. Denies needs. Breathing 24x\minute, regular. States breathing treatment helped a little. Call bell within reach. Family at bedside.

## 2012-03-24 NOTE — ED Notes (Addendum)
Patient back to room from radiology. Denies needs. Placed back on continuous neb. Call bell within reach. Family at bedside. Will continue to monitor.

## 2012-03-24 NOTE — ED Notes (Signed)
Into room to see patient. Resting sitting up in bed. Short shallow breaths at approximately 22x\minute. Made aware respiratory is coming down to administer another breathing treatment. Verbalized understanding. Call bell within reach. Denies needs. Will continue to monitor.

## 2012-03-24 NOTE — ED Notes (Signed)
RT at bedside with patient at this time for breathing treatment.

## 2012-03-24 NOTE — ED Provider Notes (Signed)
History   This chart was scribed for Laray Anger, DO by Brooks Sailors. The patient was seen in room APA05/APA05.    CSN: [id number]  Arrival date & time 03/24/12  4098   First MD Initiated Contact with Patient 03/24/12 1757      Chief Complaint  Patient presents with  . Chest Pain     HPI Pt seen at 1815.  Per pt, c/o gradual onset and persistence of constant right sided chest "pain" for the past several hours.  Has been associated with SOB.  Describes the CP as "feeling tight."  Pt states her symptoms began after she used her inhaler because she "felt like I was getting an asthma attack."  Denies palpitations, no cough, no back pain, no abd pain, no N/V/D, no fevers.     Past Medical History  Diagnosis Date  . Asthma   . Allergic rhinitis   . 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 Tubal ligation 1999  . Blood clot removed from lower abdomen 2000  . Cyst removed from ovary and fluid removed from tubes 2000  . Partial hysterectomy 2009  . Abdominal hysterectomy     Family History  Problem Relation Age of Onset  . Asthma Mother   . Hypertension Mother   . Bronchiolitis Mother   . Hypertension Father   . Hyperlipidemia Father     recurrent rectum polyps   . Diabetes Sister   . Lupus Sister   . Depression Sister     History  Substance Use Topics  . Smoking status: Former Games developer  . Smokeless tobacco: Not on file  . Alcohol Use: No    Review of Systems ROS: Statement: All systems negative except as marked or noted in the HPI; Constitutional: Negative for fever and chills. ; ; Eyes: Negative for eye pain, redness and discharge. ; ; ENMT: Negative for ear pain, hoarseness, nasal congestion, sinus pressure and sore throat. ; ; Cardiovascular: Negative for chest pain, palpitations, diaphoresis, and peripheral edema. ; ; Respiratory: +SOB, CP. Negative for cough, wheezing and stridor. ; ; Gastrointestinal:  Negative for nausea, vomiting, diarrhea, abdominal pain, blood in stool, hematemesis, jaundice and rectal bleeding. . ; ; Genitourinary: Negative for dysuria, flank pain and hematuria. ; ; Musculoskeletal: Negative for back pain and neck pain. Negative for swelling and trauma.; ; Skin: Negative for pruritus, rash, abrasions, blisters, bruising and skin lesion.; ; Neuro: Negative for headache, lightheadedness and neck stiffness. Negative for weakness, altered level of consciousness , altered mental status, extremity weakness, paresthesias, involuntary movement, seizure and syncope.     Allergies  Compazine  Home Medications   Current Outpatient Rx  Name Route Sig Dispense Refill  . ALBUTEROL SULFATE (2.5 MG/3ML) 0.083% IN NEBU Nebulization Take 3 mLs (2.5 mg total) by nebulization every 4 (four) hours. Use every 4 hours for the next 2 days; then every 4 hours as needed for wheezing, chest tightness, or shortness of breath. 75 mL 3  . ALBUTEROL SULFATE HFA 108 (90 BASE) MCG/ACT IN AERS Inhalation Inhale 2 puffs into the lungs every 4 (four) hours as needed. 1 Inhaler 3  . BUDESONIDE-FORMOTEROL FUMARATE 160-4.5 MCG/ACT IN AERO Inhalation Inhale 2 puffs into the lungs 2 (two) times daily. 1 Inhaler 12    Discontinue advair effective 03/18/2012  . FLUTICASONE PROPIONATE 50 MCG/ACT NA SUSP Nasal Place 2 sprays into the nose daily. 16 g 4  .  IPRATROPIUM-ALBUTEROL 0.5-2.5 (3) MG/3ML IN SOLN Nebulization Take 3 mLs by nebulization every 6 (six) hours as needed. 360 mL 5  . LEVOCETIRIZINE DIHYDROCHLORIDE 5 MG PO TABS Oral Take 1 tablet (5 mg total) by mouth every evening. 30 tablet 4  . MONTELUKAST SODIUM 10 MG PO TABS Oral Take 1 tablet (10 mg total) by mouth at bedtime. 30 tablet 4  . NEBULIZER/ADULT MASK KIT Does not apply by Does not apply route. Use as directed       BP 123/89  Pulse 85  Temp 97.5 F (36.4 C) (Oral)  Resp 20  Ht 5\' 6"  (1.676 m)  Wt 168 lb (76.204 kg)  BMI 27.12 kg/m2  SpO2  100%  Physical Exam 1820: Physical examination:  Nursing notes reviewed; Vital signs and O2 SAT reviewed;  Constitutional: Well developed, Well nourished, Well hydrated, Uncomfortable appearing; Head:  Normocephalic, atraumatic; Eyes: EOMI, PERRL, No scleral icterus; ENMT: Mouth and pharynx normal, Mucous membranes moist; Neck: Supple, Full range of motion, No lymphadenopathy; Cardiovascular: Regular rate and rhythm, No murmur or gallop; Respiratory: Breath sounds diminished & equal bilaterally, Speaking long phrases, Normal respiratory effort/excursion; Chest: Nontender, Movement normal; Abdomen: Soft, Nontender, Nondistended, Normal bowel sounds; Genitourinary: No CVA tenderness; Extremities: Pulses normal, No tenderness, No edema, No calf edema or asymmetry.; Neuro: AA&Ox3, Major CN grossly intact.  Speech clear. No gross focal motor or sensory deficits in extremities.; Skin: Color normal, Warm, Dry.   ED Course  Procedures   MDM  MDM Reviewed: nursing note, vitals and previous chart Reviewed previous: ECG Interpretation: labs, x-ray and ECG    Date: 03/24/2012  Rate: 87  Rhythm: normal sinus rhythm  QRS Axis: normal  Intervals: normal  ST/T Wave abnormalities: normal  Conduction Disutrbances:none  Narrative Interpretation:   Old EKG Reviewed: unchanged; no significant changes from previous EKG dated 12/27/2011.  Dg Chest 2 View 03/24/2012  *RADIOLOGY REPORT*  Clinical Data: Shortness of breath.  CHEST - 2 VIEW  Comparison: Plain film chest 07/23/2011 and CT chest 07/24/2011.  Findings: Lungs are clear.  Heart size is normal.  No pneumothorax or pleural fluid.  No focal bony abnormality.  IMPRESSION: Negative exam.  Original Report Authenticated By: Bernadene Bell. Maricela Curet, M.D.   I-stat chem did not cross over into EPIC:  Na 141, K 3.4, Cl 103, ICa 1.21, CO2 23, Glu 89, BUN 13, Cr 1, Hgb 11.6, HCT 34.  Results for orders placed during the hospital encounter of 03/24/12  D-DIMER,  QUANTITATIVE      Component Value Range   D-Dimer, Quant 0.90 (*) 0.00 - 0.48 ug/mL-FEU  POCT I-STAT TROPONIN I      Component Value Range   Troponin i, poc 0.00  0.00 - 0.08 ng/mL   Comment 3             Ct Angio Chest W/cm &/or Wo Cm 03/24/2012  *RADIOLOGY REPORT*  Clinical Data: Asthma with chest pain.  Former smoker.  CT ANGIOGRAPHY CHEST  Technique:  Multidetector CT imaging of the chest using the standard protocol during bolus administration of intravenous contrast. Multiplanar reconstructed images including MIPs were obtained and reviewed to evaluate the vascular anatomy.  Contrast: OMNIPAQUE IOHEXOL 350 MG/ML SOLN  Comparison: 03/24/2012 earlier today chest x-ray.  07/24/2011 CT chest  Findings: There is good opacification of the pulmonary vasculature. Slight cardiac motion blurs the proximal pulmonary artery which is otherwise unremarkable.  There is no visible pulmonary emboli. Normal heart size.  No pleural or pericardial effusion.  Unchanged 4 ml right middle lobe granuloma. The other adjacent small noncalcified nodules noted previously have resolved.  No pneumothorax.  No hilar or mediastinal adenopathy.  Trachea midline.  No infiltrate or edema.  Visualized upper abdominal organs unremarkable.  IMPRESSION: Negative CT chest.  Unchanged appearance compared with noncontrast exam from 2012.  Original Report Authenticated By: Elsie Stain, M.D.      6:53 PM:  Neb and prednisone given.  Pt states she feels "a little better" but continues to "feel like I need another neb treatment."  Lungs continue diminished bilat, Sats 99% R/A.  Will dose hour long neb.   10:08 PM:  Feels her asthma is improved after hour long neb and wants to go home now.  Sats remain 99-100% R/A, lungs CT bilat, resps easy.  Pt playing games on her handheld device.  States she has enough neb solution and MDI at home.  Still continues of right sided chest wall pain; likely msk given no PE on CT-A chest, EKG  unchanged from previous, and tronopin normal.  Dx testing d/w pt.  Questions answered.  Verb understanding, agreeable to d/c home with outpt f/u.       I personally performed the services described in this documentation, which was scribed in my presence. The recorded information has been reviewed and considered. Zuley Lutter Allison Quarry, DO 03/26/12 1245

## 2012-03-25 LAB — POCT I-STAT, CHEM 8
HCT: 34 % — ABNORMAL LOW (ref 36.0–46.0)
Hemoglobin: 11.6 g/dL — ABNORMAL LOW (ref 12.0–15.0)
Potassium: 3.4 mEq/L — ABNORMAL LOW (ref 3.5–5.1)
Sodium: 141 mEq/L (ref 135–145)

## 2012-03-26 ENCOUNTER — Ambulatory Visit (INDEPENDENT_AMBULATORY_CARE_PROVIDER_SITE_OTHER): Payer: Managed Care, Other (non HMO) | Admitting: Family Medicine

## 2012-03-26 ENCOUNTER — Encounter: Payer: Self-pay | Admitting: Family Medicine

## 2012-03-26 VITALS — BP 124/82 | HR 70 | Resp 18 | Ht 66.0 in | Wt 168.0 lb

## 2012-03-26 DIAGNOSIS — K219 Gastro-esophageal reflux disease without esophagitis: Secondary | ICD-10-CM | POA: Insufficient documentation

## 2012-03-26 DIAGNOSIS — J309 Allergic rhinitis, unspecified: Secondary | ICD-10-CM

## 2012-03-26 DIAGNOSIS — J45909 Unspecified asthma, uncomplicated: Secondary | ICD-10-CM

## 2012-03-26 MED ORDER — FLUTICASONE-SALMETEROL 250-50 MCG/DOSE IN AEPB: 1.0000 | INHALATION_SPRAY | Freq: Two times a day (BID) | RESPIRATORY_TRACT | Status: DC

## 2012-03-26 MED ORDER — DEXLANSOPRAZOLE 30 MG PO CPDR: 30.0000 mg | DELAYED_RELEASE_CAPSULE | Freq: Every day | ORAL | Status: DC

## 2012-03-26 NOTE — Patient Instructions (Addendum)
F/U as before.  You are referred to Dr Juanetta Gosling re your asthma  Dexilant is prescribed for reflux, you will get a coupon , if we have any

## 2012-03-27 NOTE — Assessment & Plan Note (Signed)
Recurrent flares with poor control. Refer to pulmonary. Revert to advair.  FMLA form completion. I suspect an underlying anxiety component to symptoms based on her presentations during a flare

## 2012-03-27 NOTE — Assessment & Plan Note (Signed)
Controlled, no change in medication  

## 2012-03-27 NOTE — Assessment & Plan Note (Signed)
Uncontrolled symptoms , medication prescibed

## 2012-03-27 NOTE — Progress Notes (Signed)
  Subjective:    Patient ID: Jodi [name], female    DOB: 1976-06-17, 36 y.o.   MRN: [id number]  HPI Pt was recently in the Ed 2 days ago with a chief c/o right chest pain, states she has been diagnosed in the past with chest wall pain, and this is what her problem at the time was. States in the Ed, she was diagnosed with acute asthma, though in her mind she was in no significant respiratory distress, she was given a work excuse in the Ed to return to work on 03/29/2012 per her report and advised of the need to 'rest" She has recently started symbicort in place of advair, had been doing well on the advair, but with cost in mind , as witch had been tried, I feel this has not been in her best interest, and have advised she resume the advair and stop the symbicort. I am also referring her to pulmonary. She has been asthmatic since the age of 8 approximately, but states she has  really started having major problems in the past 2 to 3 years Denies any recent fever , chills or sputum, she is again speaking in the whispered voice and hyperventillating, though she denies excessive wheeze or chest tightness currently. Requests FMLA form completion re asthma Also requests medication for GERD. States hte right chest pain, which she states was chest wall pain is better   Review of Systems See HPI  Denies sinus pressure, nasal congestion, ear pain or sore throat.  Denies chest pains, palpitations and leg swelling    Denies dysuria, frequency, hesitancy or incontinence. Denies joint pain, swelling and limitation in mobility. Denies headaches, seizures, numbness, or tingling. Denies depression, anxiety or insomnia. Denies skin break down or rash.        Objective:   Physical Exam Patient alert and oriented and in no cardiopulmonary distress.  HEENT: No facial asymmetry, EOMI, no sinus tenderness,  oropharynx pink and moist.  Neck supple no adenopathy.  Chest: Decreased air entry, scattered  wheezes, no crackles, no localized reproducible chest wall pain/tenderness  CVS: S1, S2 no murmurs, no S3.  ABD: Soft non tender. Bowel sounds normal.  Ext: No edema  MS: Adequate ROM spine, shoulders, hips and knees.  Skin: Intact, no ulcerations or rash noted.  Psych: Good eye contact, normal affect. Memory intact ,mildly anxious not  depressed appearing.  CNS: CN 2-12 intact, power, tone and sensation normal throughout.       Assessment & Plan:

## 2012-03-31 ENCOUNTER — Ambulatory Visit (HOSPITAL_COMMUNITY)
Admission: RE | Admit: 2012-03-31 | Discharge: 2012-03-31 | Disposition: A | Discharge status: Home / Self Care | Payer: Managed Care, Other (non HMO) | Source: Ambulatory Visit | Attending: Orthopaedic Surgery | Admitting: Orthopaedic Surgery

## 2012-03-31 DIAGNOSIS — M7120 Synovial cyst of popliteal space [Baker], unspecified knee: Secondary | ICD-10-CM | POA: Insufficient documentation

## 2012-03-31 DIAGNOSIS — M25561 Pain in right knee: Secondary | ICD-10-CM

## 2012-03-31 DIAGNOSIS — M25569 Pain in unspecified knee: Secondary | ICD-10-CM | POA: Insufficient documentation

## 2012-03-31 DIAGNOSIS — M25469 Effusion, unspecified knee: Secondary | ICD-10-CM | POA: Insufficient documentation

## 2012-04-02 ENCOUNTER — Other Ambulatory Visit: Payer: Self-pay | Admitting: Family Medicine

## 2012-04-14 ENCOUNTER — Telehealth: Payer: Self-pay | Admitting: Family Medicine

## 2012-04-26 ENCOUNTER — Telehealth: Payer: Self-pay | Admitting: Family Medicine

## 2012-04-26 NOTE — Telephone Encounter (Signed)
Noted  

## 2012-04-26 NOTE — Telephone Encounter (Signed)
Spoke with pt and let her know that papers had not been scanned into the computer as of yet and that i would be happy to fax the paper if she could bring in the original.

## 2012-05-17 ENCOUNTER — Emergency Department (HOSPITAL_COMMUNITY): Admission: EM | Admit: 2012-05-17 | Payer: Self-pay | Source: Home / Self Care

## 2012-05-17 ENCOUNTER — Emergency Department (HOSPITAL_COMMUNITY)
Admission: EM | Admit: 2012-05-17 | Discharge: 2012-05-17 | Disposition: A | Discharge status: Home / Self Care | Payer: Managed Care, Other (non HMO) | Attending: Emergency Medicine | Admitting: Emergency Medicine

## 2012-05-17 ENCOUNTER — Encounter (HOSPITAL_COMMUNITY): Payer: Self-pay | Admitting: Emergency Medicine

## 2012-05-17 DIAGNOSIS — J45901 Unspecified asthma with (acute) exacerbation: Secondary | ICD-10-CM

## 2012-05-17 DIAGNOSIS — Z79899 Other long term (current) drug therapy: Secondary | ICD-10-CM | POA: Insufficient documentation

## 2012-05-17 DIAGNOSIS — Z888 Allergy status to other drugs, medicaments and biological substances status: Secondary | ICD-10-CM | POA: Insufficient documentation

## 2012-05-17 DIAGNOSIS — J45909 Unspecified asthma, uncomplicated: Secondary | ICD-10-CM | POA: Insufficient documentation

## 2012-05-17 DIAGNOSIS — J069 Acute upper respiratory infection, unspecified: Secondary | ICD-10-CM | POA: Insufficient documentation

## 2012-05-17 DIAGNOSIS — Z87891 Personal history of nicotine dependence: Secondary | ICD-10-CM | POA: Insufficient documentation

## 2012-05-17 HISTORY — DX: Acute embolism and thrombosis of unspecified deep veins of unspecified lower extremity: I82.409

## 2012-05-17 HISTORY — DX: Unspecified asthma, uncomplicated: J45.909

## 2012-05-17 MED ORDER — PREDNISONE 20 MG PO TABS
40.0000 mg | ORAL_TABLET | Freq: Once | ORAL | Status: AC
  Administered 2012-05-17: 40 mg via ORAL
  Filled 2012-05-17: qty 1

## 2012-05-17 MED ORDER — PREDNISONE 20 MG PO TABS: 40.0000 mg | ORAL_TABLET | Freq: Every day | ORAL | Status: AC

## 2012-05-17 MED ORDER — ALBUTEROL (5 MG/ML) CONTINUOUS INHALATION SOLN
10.0000 mg/h | INHALATION_SOLUTION | RESPIRATORY_TRACT | Status: AC
  Administered 2012-05-17: 10 mg/h via RESPIRATORY_TRACT

## 2012-05-17 MED ORDER — LEVALBUTEROL TARTRATE 45 MCG/ACT IN AERO: 1.0000 | INHALATION_SPRAY | RESPIRATORY_TRACT | Status: DC | PRN

## 2012-05-17 NOTE — ED Notes (Signed)
Pt c/o difficulty breathing onset today, chest feels tight, pt did do home nebs, PWD

## 2012-05-17 NOTE — ED Notes (Signed)
Pt c/o difficulty breathing onset yesterday, pt attempted neb at home. PWD

## 2012-05-17 NOTE — ED Provider Notes (Signed)
History     CSN: [id number]  Arrival date & time 05/17/12  0330   First MD Initiated Contact with Patient 05/17/12 [phone]      Chief Complaint  Patient presents with  . Asthma    (Consider location/radiation/quality/duration/timing/severity/associated sxs/prior treatment) HPI Comments: 36 year old female with a history of asthma which has been severe in the past presents with a complaint of shortness of breath. This has been going on for approximately 18 hours, gradually getting worse, associated with wheezing and sore throat. She has minimal coughing this evening and no fevers. She has tried albuterol prior to arrival with minimal improvement, has not been on prednisone for approximately 7 weeks.  Patient is a 36 y.o. female presenting with asthma. The history is provided by the patient and medical records.  Asthma    Past Medical History  Diagnosis Date  . Asthma   . Allergic rhinitis   . 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 Tubal ligation 1999  . Blood clot removed from lower abdomen 2000  . Cyst removed from ovary and fluid removed from tubes 2000  . Partial hysterectomy 2009  . Abdominal hysterectomy     Family History  Problem Relation Age of Onset  . Asthma Mother   . Hypertension Mother   . Bronchiolitis Mother   . Hypertension Father   . Hyperlipidemia Father     recurrent rectum polyps   . Diabetes Sister   . Lupus Sister   . Depression Sister     History  Substance Use Topics  . Smoking status: Former Games developer  . Smokeless tobacco: Not on file  . Alcohol Use: No    OB History    Grav Para Term Preterm Abortions TAB SAB Ect Mult Living                  Review of Systems  All other systems reviewed and are negative.    Allergies  Compazine  Home Medications   Current Outpatient Rx  Name Route Sig Dispense Refill  . ALBUTEROL SULFATE (2.5 MG/3ML) 0.083% IN NEBU Nebulization Take 3 mLs  (2.5 mg total) by nebulization every 4 (four) hours. Use every 4 hours for the next 2 days; then every 4 hours as needed for wheezing, chest tightness, or shortness of breath. 75 mL 3  . ALBUTEROL SULFATE HFA 108 (90 BASE) MCG/ACT IN AERS Inhalation Inhale 2 puffs into the lungs every 4 (four) hours as needed. 1 Inhaler 3  . FLUTICASONE PROPIONATE 50 MCG/ACT NA SUSP Nasal Place 2 sprays into the nose daily. 16 g 4  . FLUTICASONE-SALMETEROL 250-50 MCG/DOSE IN AEPB Inhalation Inhale 1 puff into the lungs 2 (two) times daily. 60 each 3  . LEVOCETIRIZINE DIHYDROCHLORIDE 5 MG PO TABS Oral Take 1 tablet (5 mg total) by mouth every evening. 30 tablet 4  . MONTELUKAST SODIUM 10 MG PO TABS Oral Take 10 mg by mouth every morning.    . DEXLANSOPRAZOLE 30 MG PO CPDR Oral Take 1 capsule (30 mg total) by mouth daily. 30 capsule 3  . HYDROCODONE-ACETAMINOPHEN 5-325 MG PO TABS Oral Take 1 tablet by mouth every 6 (six) hours as needed. FOR KNEE PAIN    . LEVALBUTEROL TARTRATE 45 MCG/ACT IN AERO Inhalation Inhale 1-2 puffs into the lungs every 4 (four) hours as needed for wheezing. 1 Inhaler 12  . PREDNISONE 20 MG PO TABS Oral Take 2 tablets (  40 mg total) by mouth daily. 10 tablet 0    BP 146/57  Pulse 117  Temp 98.4 F (36.9 C) (Oral)  Resp 18  Ht 5\' 6"  (1.676 m)  Wt 170 lb (77.111 kg)  BMI 27.44 kg/m2  SpO2 100%  Physical Exam  Nursing note and vitals reviewed. Constitutional: She appears well-developed and well-nourished. No distress.  HENT:  Head: Normocephalic and atraumatic.  Mouth/Throat: No oropharyngeal exudate.       Erythema of the posterior pharynx, no exudate hypertrophy or asymmetry  Eyes: Conjunctivae and EOM are normal. Pupils are equal, round, and reactive to light. Right eye exhibits no discharge. Left eye exhibits no discharge. No scleral icterus.  Neck: Normal range of motion. Neck supple. No JVD present. No thyromegaly present.  Cardiovascular: Normal rate, regular rhythm, normal  heart sounds and intact distal pulses.  Exam reveals no gallop and no friction rub.   No murmur heard. Pulmonary/Chest: Effort normal. No respiratory distress. She has wheezes. She has no rales.       Mild expiratory wheezing, increased expiratory phase, decreased lung sounds bilaterally  Abdominal: Soft. Bowel sounds are normal. She exhibits no distension and no mass. There is no tenderness.  Musculoskeletal: Normal range of motion. She exhibits no edema and no tenderness.  Lymphadenopathy:    She has no cervical adenopathy.  Neurological: She is alert. Coordination normal.  Skin: Skin is warm and dry. No rash noted. No erythema.  Psychiatric: She has a normal mood and affect. Her behavior is normal.    ED Course  Procedures (including critical care time)  Labs Reviewed - No data to display No results found.   1. Asthma attack   2. URI (upper respiratory infection)       MDM  Vital signs reveal oxygen saturations 100%, pulse of 85, respirations of 20, focal expiratory wheezing. We'll give nebulizer therapy, prednisone, likely has upper respiratory infection which is viral in etiology, no other signs of severe bacterial disease.  Much improved after medications, mild tachycardia after albuterol nebulizer. No wheezing at this time, speaking in full sentences. Patient appears stable for discharge with oxygen of 100% on room air with no distress. Xopenex and prednisone given for home.  Discharge Prescriptions include:  xopenex Prednisone   Vida Roller, MD 05/17/12 [phone]

## 2012-05-17 NOTE — ED Notes (Signed)
Patient is resting comfortably. Breathing treatment complete.

## 2012-05-18 ENCOUNTER — Encounter (HOSPITAL_COMMUNITY): Payer: Self-pay | Admitting: Emergency Medicine

## 2012-05-27 ENCOUNTER — Other Ambulatory Visit (HOSPITAL_COMMUNITY): Payer: Self-pay | Admitting: Pulmonary Disease

## 2012-05-27 DIAGNOSIS — J45909 Unspecified asthma, uncomplicated: Secondary | ICD-10-CM

## 2012-06-10 ENCOUNTER — Ambulatory Visit (HOSPITAL_COMMUNITY)
Admission: RE | Admit: 2012-06-10 | Discharge: 2012-06-10 | Disposition: A | Discharge status: Home / Self Care | Payer: Managed Care, Other (non HMO) | Source: Ambulatory Visit | Attending: Pulmonary Disease | Admitting: Pulmonary Disease

## 2012-06-10 DIAGNOSIS — R0989 Other specified symptoms and signs involving the circulatory and respiratory systems: Secondary | ICD-10-CM | POA: Insufficient documentation

## 2012-06-10 DIAGNOSIS — R0609 Other forms of dyspnea: Secondary | ICD-10-CM | POA: Insufficient documentation

## 2012-06-10 DIAGNOSIS — J45909 Unspecified asthma, uncomplicated: Secondary | ICD-10-CM | POA: Insufficient documentation

## 2012-06-10 NOTE — Procedures (Signed)
NAME:  Jodi [name], Jodi [name] NO.:  [id number]  MEDICAL RECORD NO.:  [id number]  LOCATION:  RESP                          FACILITY:  [facility]  PHYSICIAN:  Yasha Tibbett L. Juanetta Gosling, M.D.DATE OF BIRTH:  1975/11/07  DATE OF PROCEDURE: DATE OF DISCHARGE:                           PULMONARY FUNCTION TEST   Reason for pulmonary function testing is asthma.  1. Spirometry shows no definite ventilatory defect, but does show     evidence of mild airflow obstruction. 2. Lung volumes are normal with some air trapping. 3. DLCO is mildly reduced, but corrects when ventilation is     considered. 4. Airway resistance is slightly high confirming the presence of     airflow obstruction.     Linh Hedberg L. Juanetta Gosling, M.D.     [facility]  D:  06/10/2012  T:  06/10/2012  Job:  [id number]

## 2012-06-11 LAB — PULMONARY FUNCTION TEST

## 2012-07-27 ENCOUNTER — Telehealth: Payer: Self-pay | Admitting: Family Medicine

## 2012-07-27 DIAGNOSIS — Z13220 Encounter for screening for lipoid disorders: Secondary | ICD-10-CM

## 2012-07-27 DIAGNOSIS — Z139 Encounter for screening, unspecified: Secondary | ICD-10-CM

## 2012-07-27 DIAGNOSIS — R5383 Other fatigue: Secondary | ICD-10-CM

## 2012-07-27 DIAGNOSIS — D649 Anemia, unspecified: Secondary | ICD-10-CM

## 2012-07-28 NOTE — Telephone Encounter (Signed)
pt Aware. Order faxed

## 2012-08-11 ENCOUNTER — Ambulatory Visit (INDEPENDENT_AMBULATORY_CARE_PROVIDER_SITE_OTHER): Payer: Managed Care, Other (non HMO) | Admitting: Family Medicine

## 2012-08-11 ENCOUNTER — Encounter: Payer: Self-pay | Admitting: Family Medicine

## 2012-08-11 VITALS — BP 126/78 | HR 86 | Resp 18 | Ht 66.0 in | Wt 171.0 lb

## 2012-08-11 DIAGNOSIS — J45909 Unspecified asthma, uncomplicated: Secondary | ICD-10-CM

## 2012-08-11 DIAGNOSIS — E663 Overweight: Secondary | ICD-10-CM

## 2012-08-11 DIAGNOSIS — K219 Gastro-esophageal reflux disease without esophagitis: Secondary | ICD-10-CM

## 2012-08-11 DIAGNOSIS — J309 Allergic rhinitis, unspecified: Secondary | ICD-10-CM

## 2012-08-11 DIAGNOSIS — E785 Hyperlipidemia, unspecified: Secondary | ICD-10-CM

## 2012-08-11 DIAGNOSIS — J0100 Acute maxillary sinusitis, unspecified: Secondary | ICD-10-CM

## 2012-08-11 HISTORY — DX: Hyperlipidemia, unspecified: E78.5

## 2012-08-11 LAB — CBC WITH DIFFERENTIAL/PLATELET
Basophils Absolute: 0 10*3/uL (ref 0.0–0.1)
Eosinophils Absolute: 0.1 10*3/uL (ref 0.0–0.7)
Eosinophils Relative: 3 % (ref 0–5)
HCT: 35.8 % — ABNORMAL LOW (ref 36.0–46.0)
Lymphocytes Relative: 34 % (ref 12–46)
MCH: 30 pg (ref 26.0–34.0)
MCV: 88.6 fL (ref 78.0–100.0)
Monocytes Absolute: 0.4 10*3/uL (ref 0.1–1.0)
RDW: 14.1 % (ref 11.5–15.5)
WBC: 4.6 10*3/uL (ref 4.0–10.5)

## 2012-08-11 LAB — LIPID PANEL
HDL: 57 mg/dL (ref >39)
LDL Cholesterol: 146 mg/dL — ABNORMAL HIGH (ref 0–99)
Total CHOL/HDL Ratio: 3.7 Ratio

## 2012-08-11 LAB — BASIC METABOLIC PANEL
CO2: 27 mEq/L (ref 19–32)
Calcium: 9.2 mg/dL (ref 8.4–10.5)
Potassium: 4.4 mEq/L (ref 3.5–5.3)
Sodium: 141 mEq/L (ref 135–145)

## 2012-08-11 LAB — VITAMIN D 25 HYDROXY (VIT D DEFICIENCY, FRACTURES): Vit D, 25-Hydroxy: 31 ng/mL (ref 30–89)

## 2012-08-11 MED ORDER — DOXYCYCLINE HYCLATE 100 MG PO TABS: 100.0000 mg | ORAL_TABLET | Freq: Two times a day (BID) | ORAL | Status: AC

## 2012-08-11 MED ORDER — FLUCONAZOLE 150 MG PO TABS: ORAL_TABLET | ORAL | Status: DC

## 2012-08-11 NOTE — Assessment & Plan Note (Addendum)
Acute infection, antibiotic script written, pt encouraged to carry out regular aural toilet also

## 2012-08-11 NOTE — Assessment & Plan Note (Signed)
Improved control, pt to continue current plan, also she is re educated about the imoportance of daily spirometry

## 2012-08-11 NOTE — Progress Notes (Signed)
  Subjective:    Patient ID: Jodi [name], female    DOB: [birth year], 36 y.o.   MRN: [id number]  HPI The PT is here for follow up and re-evaluation of chronic medical conditions, medication management and review of any available recent lab and radiology data.  Preventive health is updated, specifically  Cancer screening and Immunization.   Questions or concerns regarding consultations or procedures which the PT has had in the interim are  Addressed.She is now seeing pulmonary every 2 to 3 months for asthma states this is better, however, feels challenged by $25 co pay for advair 1 week h/o increased right maxillary pressure  And drainage intermittent chills , no fever      Review of Systems See HPI  Denies , ear pain or sore throat. Denies chest congestion, productive cough or uncontrolled  wheezing. Denies chest pains, palpitations and leg swelling Denies abdominal pain, nausea, vomiting,diarrhea or constipation.   Denies dysuria, frequency, hesitancy or incontinence. Denies joint pain, swelling and limitation in mobility. Denies headaches, seizures, numbness, or tingling. Denies depression, anxiety or insomnia. Denies skin break down or rash.        Objective:   Physical Exam Patient alert and oriented and in no cardiopulmonary distress.  HEENT: No facial asymmetry, EOMI, right maxillary sinus tenderness,  oropharynx pink and moist.  Neck supple no adenopathy.  Chest: Clear to auscultation bilaterally.Decreased air entry throughout  CVS: S1, S2 no murmurs, no S3.  ABD: Soft non tender. Bowel sounds normal.  Ext: No edema  MS: Adequate ROM spine, shoulders, hips and knees.  Skin: Intact, no ulcerations or rash noted.  Psych: Good eye contact, normal affect. Memory intact not anxious or depressed appearing.  CNS: CN 2-12 intact, power, tone and sensation normal throughout.        Assessment & Plan:

## 2012-08-11 NOTE — Assessment & Plan Note (Signed)
Hyperlipidemia:Low fat diet discussed and encouraged.   

## 2012-08-11 NOTE — Assessment & Plan Note (Signed)
Controlled, no change in medication  

## 2012-08-11 NOTE — Patient Instructions (Signed)
F/u in 5  month, call if you need me before  You are being treated for right maxillary sinusitis. Antibiotics and med for fungal infection are sent in  Please discuss use of peak flow meter with Dr Juanetta Gosling, I will write a script  You need to reduce fried and fatty foods, chlesterol is high, also start calcium with D 1200mg /1000IU once daily for bone health  We will provide you with info to try to obtain asistance for Advair

## 2012-08-11 NOTE — Assessment & Plan Note (Signed)
unchanged Patient re-educated about  the importance of commitment to a  minimum of 150 minutes of exercise per week. The importance of healthy food choices with portion control discussed. Encouraged to start a food diary, count calories and to consider  joining a support group. Sample diet sheets offered. Goals set by the patient for the next several months.    

## 2012-08-17 ENCOUNTER — Encounter: Payer: Self-pay | Admitting: Family Medicine

## 2012-08-17 ENCOUNTER — Ambulatory Visit (INDEPENDENT_AMBULATORY_CARE_PROVIDER_SITE_OTHER): Payer: Managed Care, Other (non HMO) | Admitting: Family Medicine

## 2012-08-17 VITALS — BP 122/84 | HR 88 | Temp 98.9°F | Resp 15 | Ht 66.0 in | Wt 172.4 lb

## 2012-08-17 DIAGNOSIS — H9201 Otalgia, right ear: Secondary | ICD-10-CM

## 2012-08-17 DIAGNOSIS — J0100 Acute maxillary sinusitis, unspecified: Secondary | ICD-10-CM

## 2012-08-17 DIAGNOSIS — J45909 Unspecified asthma, uncomplicated: Secondary | ICD-10-CM

## 2012-08-17 DIAGNOSIS — H9209 Otalgia, unspecified ear: Secondary | ICD-10-CM

## 2012-08-17 MED ORDER — CEPHALEXIN 500 MG PO CAPS: 500.0000 mg | ORAL_CAPSULE | Freq: Four times a day (QID) | ORAL | Status: AC

## 2012-08-17 NOTE — Progress Notes (Signed)
  Subjective:    Patient ID: Jodi [name], female    DOB: Oct 13, 1976, 36 y.o.   MRN: [id number]  HPI 3 day h/o right ear pain, with right swollen cheek , chills and low grade temp 99.7 when checked  last night at work. Denies tooth ache , though made an appointment to see dentist  Denies nasal drainage , anterior or posterior, denies sore throat or cough.Still taking doxycycline recently prescribed for sinus symptoms   Review of Systems See HPI Denies recent fever or chills. Denies sinus pressure, nasal congestion,  or sore throat. Denies chest congestion, productive cough or wheezing. Denies chest pains, palpitations and leg swelling Denies headaches, seizures, numbness, or tingling. Denies depression, anxiety or insomnia. Denies skin break down or rash.        Objective:   Physical Exam Patient alert and oriented and in no cardiopulmonary distress.  HEENT: Slight swelling over right cheek which is tender, EOMI, right maxillary sinus tenderness,  oropharynx pink and moist.  Neck supple no adenopathy.TM clear bilaterally, no erythema of TM  Chest: Clear to auscultation bilaterally.  CVS: S1, S2 no murmurs, no S3.  ABD: Soft non tender. Bowel sounds normal.  Ext: No edema  MS: Adequate ROM spine, shoulders, hips and knees.  Skin: Intact, no ulcerations or rash noted.  Psych: Good eye contact, normal affect. Memory intact not anxious or depressed appearing.  CNS: CN 2-12 intact, power, tone and sensation normal throughout.        Assessment & Plan:

## 2012-08-17 NOTE — Patient Instructions (Addendum)
F/u as before.  You are being treated for facial pain which I believe is due  To dental problems, please keep appt with your dentist.  New additional medicine is keflex, pls take all of this

## 2012-08-18 DIAGNOSIS — H9201 Otalgia, right ear: Secondary | ICD-10-CM | POA: Insufficient documentation

## 2012-08-18 NOTE — Assessment & Plan Note (Signed)
I believe pain is referred from dental infection of right upper molar, and will add another antibiotic for covwerage, also pt is advised of the need to keep dental appt

## 2012-08-18 NOTE — Assessment & Plan Note (Signed)
Stable at this time 

## 2012-10-05 ENCOUNTER — Ambulatory Visit (INDEPENDENT_AMBULATORY_CARE_PROVIDER_SITE_OTHER): Payer: Managed Care, Other (non HMO) | Admitting: Family Medicine

## 2012-10-05 ENCOUNTER — Encounter: Payer: Self-pay | Admitting: Family Medicine

## 2012-10-05 VITALS — BP 116/78 | HR 84 | Resp 18 | Ht 66.0 in | Wt 170.1 lb

## 2012-10-05 DIAGNOSIS — R52 Pain, unspecified: Secondary | ICD-10-CM

## 2012-10-05 DIAGNOSIS — R1011 Right upper quadrant pain: Secondary | ICD-10-CM | POA: Insufficient documentation

## 2012-10-05 DIAGNOSIS — J45909 Unspecified asthma, uncomplicated: Secondary | ICD-10-CM

## 2012-10-05 DIAGNOSIS — R109 Unspecified abdominal pain: Secondary | ICD-10-CM

## 2012-10-05 LAB — COMPREHENSIVE METABOLIC PANEL
ALT: 19 U/L (ref 0–35)
AST: 20 U/L (ref 0–37)
Albumin: 4.4 g/dL (ref 3.5–5.2)
Calcium: 9.5 mg/dL (ref 8.4–10.5)
Chloride: 103 mEq/L (ref 96–112)
Potassium: 4.3 mEq/L (ref 3.5–5.3)
Total Protein: 6.8 g/dL (ref 6.0–8.3)

## 2012-10-05 NOTE — Patient Instructions (Addendum)
F/u as before.  You have symptoms which suggest gall bladder problems, you need an ultrasound of the gallbladder   If this is normal , please get the labs drawn which have been ordered to ensure no pancreatitis,or other abdominal pathology( lipase, amylase, cmp, hpylori)  If no answer is found today and symptoms persist or worsen , please go to the ED for further evaluation

## 2012-10-05 NOTE — Progress Notes (Signed)
  Subjective:    Patient ID: Jodi [name], female    DOB: 05-04-1976, 36 y.o.   MRN: [id number]  HPI 3 day h/o intermittent RUQ pain, up to a 10, worse with deep breathing, intermittent chills, no nausea or vomit, h/o recurrent bloating and belching Denies hematuria, urinary frequency or known gallstone history   Review of Systems See HPI Denies recent fever or chills. Denies sinus pressure, nasal congestion, ear pain or sore throat. Denies chest congestion, productive cough or wheezing. Denies chest pains, palpitations and leg swelling Denies diarrhea or constipation.   Denies dysuria, frequency, hesitancy or incontinence.         Objective:   Physical Exam Patient alert and oriented and in no cardiopulmonary distress.Anxious and in pain  HEENT: No facial asymmetry, EOMI, no sinus tenderness,  oropharynx pink and moist.  Neck supple no adenopathy.  Chest: Clear to auscultation bilaterally.  CVS: S1, S2 no murmurs, no S3.  ABD: Soft RUQ tenderness increased with deep breath, no palpable organomegaly or mass. Normal bowel sounds Ext: No edema  MS: Adequate ROM spine, shoulders, hips and knees.   Psych: Good eye contact, normal affect. Memory intact  anxious not  depressed appearing.  CNS: CN 2-12 intact, power,  normal throughout.        Assessment & Plan:

## 2012-10-07 ENCOUNTER — Ambulatory Visit (HOSPITAL_COMMUNITY)
Admission: RE | Admit: 2012-10-07 | Discharge: 2012-10-07 | Disposition: A | Discharge status: Home / Self Care | Payer: Managed Care, Other (non HMO) | Source: Ambulatory Visit | Attending: Family Medicine | Admitting: Family Medicine

## 2012-10-07 ENCOUNTER — Other Ambulatory Visit: Payer: Self-pay | Admitting: Family Medicine

## 2012-10-07 ENCOUNTER — Telehealth: Payer: Self-pay | Admitting: Family Medicine

## 2012-10-07 DIAGNOSIS — R1011 Right upper quadrant pain: Secondary | ICD-10-CM | POA: Insufficient documentation

## 2012-10-07 DIAGNOSIS — R109 Unspecified abdominal pain: Secondary | ICD-10-CM

## 2012-10-08 NOTE — Telephone Encounter (Signed)
Patient aware of results.

## 2012-10-13 NOTE — Assessment & Plan Note (Signed)
Acute RUQ pain suggestive of gallbladder disease with abnormal exam. Urgent imaging studies, also labs if imaging study abnormal. Not able to perform test on day of visit, she is advised to g to Ed if symptoms worsen

## 2012-10-13 NOTE — Assessment & Plan Note (Signed)
Stable at this time 

## 2012-10-14 ENCOUNTER — Telehealth: Payer: Self-pay | Admitting: Family Medicine

## 2012-10-14 NOTE — Progress Notes (Signed)
Pt has appt at [facility] for hida scan 10/18/2012 8:00. Npo after midnight. Left a message for pt about her appt and time

## 2012-10-14 NOTE — Telephone Encounter (Signed)
Wanted to know if the HIDA scan had been scheduled. Transferred to [facility]

## 2012-10-14 NOTE — Telephone Encounter (Signed)
Called pt left message .

## 2012-10-18 ENCOUNTER — Encounter (HOSPITAL_COMMUNITY)
Admission: RE | Admit: 2012-10-18 | Discharge: 2012-10-18 | Disposition: A | Discharge status: Home / Self Care | Payer: Managed Care, Other (non HMO) | Source: Ambulatory Visit | Attending: Family Medicine | Admitting: Family Medicine

## 2012-10-18 ENCOUNTER — Encounter (HOSPITAL_COMMUNITY): Payer: Self-pay

## 2012-10-18 ENCOUNTER — Telehealth: Payer: Self-pay | Admitting: Family Medicine

## 2012-10-18 DIAGNOSIS — R1011 Right upper quadrant pain: Secondary | ICD-10-CM | POA: Insufficient documentation

## 2012-10-18 MED ORDER — TECHNETIUM TC 99M MEBROFENIN IV KIT
5.0000 | PACK | Freq: Once | INTRAVENOUS | Status: AC | PRN
  Administered 2012-10-18: 5 via INTRAVENOUS

## 2012-10-19 ENCOUNTER — Emergency Department (HOSPITAL_COMMUNITY)
Admission: EM | Admit: 2012-10-19 | Discharge: 2012-10-19 | Discharge status: Against Medical Advice | Payer: Managed Care, Other (non HMO)

## 2012-10-19 ENCOUNTER — Encounter (HOSPITAL_COMMUNITY): Payer: Self-pay | Admitting: *Deleted

## 2012-10-19 ENCOUNTER — Emergency Department (HOSPITAL_COMMUNITY): Payer: Managed Care, Other (non HMO)

## 2012-10-19 ENCOUNTER — Emergency Department (HOSPITAL_COMMUNITY)
Admission: EM | Admit: 2012-10-19 | Discharge: 2012-10-19 | Disposition: A | Discharge status: Home / Self Care | Payer: Managed Care, Other (non HMO) | Attending: Emergency Medicine | Admitting: Emergency Medicine

## 2012-10-19 ENCOUNTER — Telehealth: Payer: Self-pay | Admitting: Family Medicine

## 2012-10-19 DIAGNOSIS — Z79899 Other long term (current) drug therapy: Secondary | ICD-10-CM | POA: Insufficient documentation

## 2012-10-19 DIAGNOSIS — Z8709 Personal history of other diseases of the respiratory system: Secondary | ICD-10-CM | POA: Insufficient documentation

## 2012-10-19 DIAGNOSIS — R079 Chest pain, unspecified: Secondary | ICD-10-CM | POA: Insufficient documentation

## 2012-10-19 DIAGNOSIS — Z86718 Personal history of other venous thrombosis and embolism: Secondary | ICD-10-CM | POA: Insufficient documentation

## 2012-10-19 DIAGNOSIS — Z87891 Personal history of nicotine dependence: Secondary | ICD-10-CM | POA: Insufficient documentation

## 2012-10-19 DIAGNOSIS — J45909 Unspecified asthma, uncomplicated: Secondary | ICD-10-CM | POA: Insufficient documentation

## 2012-10-19 DIAGNOSIS — K279 Peptic ulcer, site unspecified, unspecified as acute or chronic, without hemorrhage or perforation: Secondary | ICD-10-CM | POA: Insufficient documentation

## 2012-10-19 LAB — CBC WITH DIFFERENTIAL/PLATELET
Basophils Absolute: 0 10*3/uL (ref 0.0–0.1)
Eosinophils Relative: 2 % (ref 0–5)
HCT: 37.2 % (ref 36.0–46.0)
Lymphocytes Relative: 29 % (ref 12–46)
MCHC: 34.7 g/dL (ref 30.0–36.0)
MCV: 86.1 fL (ref 78.0–100.0)
Monocytes Absolute: 0.6 10*3/uL (ref 0.1–1.0)
RDW: 13 % (ref 11.5–15.5)
WBC: 6.5 10*3/uL (ref 4.0–10.5)

## 2012-10-19 LAB — COMPREHENSIVE METABOLIC PANEL
Albumin: 4.4 g/dL (ref 3.5–5.2)
BUN: 10 mg/dL (ref 6–23)
Chloride: 99 mEq/L (ref 96–112)
Creatinine, Ser: 0.87 mg/dL (ref 0.50–1.10)
GFR calc Af Amer: >90 mL/min (ref >90)
GFR calc non Af Amer: 83 mL/min — ABNORMAL LOW (ref >90)
Total Bilirubin: 0.7 mg/dL (ref 0.3–1.2)

## 2012-10-19 LAB — URINALYSIS, ROUTINE W REFLEX MICROSCOPIC
Bilirubin Urine: NEGATIVE
Glucose, UA: NEGATIVE mg/dL
Protein, ur: NEGATIVE mg/dL

## 2012-10-19 LAB — URINE MICROSCOPIC-ADD ON

## 2012-10-19 MED ORDER — SODIUM CHLORIDE 0.9 % IV SOLN
Freq: Once | INTRAVENOUS | Status: AC
  Administered 2012-10-19: 19:00:00 via INTRAVENOUS

## 2012-10-19 MED ORDER — IOHEXOL 300 MG/ML  SOLN
100.0000 mL | Freq: Once | INTRAMUSCULAR | Status: AC | PRN
  Administered 2012-10-19: 100 mL via INTRAVENOUS

## 2012-10-19 MED ORDER — PANTOPRAZOLE SODIUM 20 MG PO TBEC: 20.0000 mg | DELAYED_RELEASE_TABLET | Freq: Every day | ORAL | Status: DC

## 2012-10-19 MED ORDER — HYDROMORPHONE HCL PF 1 MG/ML IJ SOLN
1.0000 mg | Freq: Once | INTRAMUSCULAR | Status: AC
  Administered 2012-10-19: 1 mg via INTRAVENOUS
  Filled 2012-10-19: qty 1

## 2012-10-19 MED ORDER — ONDANSETRON HCL 4 MG/2ML IJ SOLN
4.0000 mg | Freq: Once | INTRAMUSCULAR | Status: AC
  Administered 2012-10-19: 4 mg via INTRAVENOUS
  Filled 2012-10-19: qty 2

## 2012-10-19 MED ORDER — PANTOPRAZOLE SODIUM 40 MG IV SOLR
40.0000 mg | Freq: Once | INTRAVENOUS | Status: AC
Start: 2012-10-19 — End: 2012-10-19
  Administered 2012-10-19: 40 mg via INTRAVENOUS
  Filled 2012-10-19: qty 40

## 2012-10-19 MED ORDER — HYDROCODONE-ACETAMINOPHEN 5-325 MG PO TABS: 1.0000 | ORAL_TABLET | ORAL | Status: DC | PRN

## 2012-10-19 NOTE — ED Provider Notes (Signed)
History     CSN: [id number]  Arrival date & time 10/19/12  1727   First MD Initiated Contact with Patient 10/19/12 1753      Chief Complaint  Patient presents with  . Abdominal Pain    (Consider location/radiation/quality/duration/timing/severity/associated sxs/prior treatment) HPI Comments: The patient is a 37 year old woman who complains of right upper quadrant abdominal pain. Started about 2 weeks ago. She was evaluated by her family physician, Syliva Overman M.D., who ordered abdominal ultrasound, which was negative. Subsequently she ordered HIDA scan, which was done yesterday and also was negative. The patient continues to have pain in the right upper quadrant, that radiates through to the back it also goes up into her chest. In addition she has had excessive burping and flatus. She also notes some pain in her right inguinal region. She has had a prior hysterectomy.  Patient is a 37 y.o. female presenting with abdominal pain. The history is provided by the patient and medical records. No language interpreter was used.  Abdominal Pain The primary symptoms of the illness include abdominal pain. The primary symptoms of the illness do not include nausea, vomiting or diarrhea. The current episode started more than 2 days ago (Pain has been present intermittently for 2 weeks. It is worsened by eating.). The onset of the illness was gradual. The problem has been gradually worsening.  The illness is associated with eating. The patient states that she believes she is currently not pregnant. The patient has not had a change in bowel habit. Risk factors for an acute abdominal problem include a history of abdominal surgery (Prior hysterectomy.). Associated medical issues comments: She has asthma, and has a history of DVT in the left leg..    Past Medical History  Diagnosis Date  . Asthma   . DVT (deep venous thrombosis)   . Asthma   . Allergic rhinitis   . 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 Tubal ligation 1999  . Blood clot removed from lower abdomen 2000  . Cyst removed from ovary and fluid removed from tubes 2000  . Partial hysterectomy 2009  . Abdominal hysterectomy     Family History  Problem Relation Age of Onset  . Asthma Mother   . Hypertension Mother   . Bronchiolitis Mother   . Hypertension Father   . Hyperlipidemia Father     recurrent rectum polyps   . Diabetes Sister   . Lupus Sister   . Depression Sister     History  Substance Use Topics  . Smoking status: Former Games developer  . Smokeless tobacco: Not on file  . Alcohol Use: No    OB History    Grav Para Term Preterm Abortions TAB SAB Ect Mult Living                  Review of Systems  Constitutional: Negative.   HENT: Negative.   Eyes: Negative.   Respiratory: Negative.   Cardiovascular: Positive for chest pain.  Gastrointestinal: Positive for abdominal pain. Negative for nausea, vomiting and diarrhea.  Genitourinary: Negative.   Musculoskeletal: Negative.   Skin: Negative.   Neurological: Negative.   Psychiatric/Behavioral: Negative.     Allergies  Compazine and Compazine  Home Medications   Current Outpatient Rx  Name  Route  Sig  Dispense  Refill  . ALBUTEROL SULFATE (2.5 MG/3ML) 0.083% IN NEBU   Nebulization   Take 3 mLs (2.5  mg total) by nebulization every 4 (four) hours. Use every 4 hours for the next 2 days; then every 4 hours as needed for wheezing, chest tightness, or shortness of breath.   75 mL   3   . ALBUTEROL SULFATE HFA 108 (90 BASE) MCG/ACT IN AERS   Inhalation   Inhale 2 puffs into the lungs every 4 (four) hours as needed.   1 Inhaler   3   . FLUTICASONE PROPIONATE 50 MCG/ACT NA SUSP   Nasal   Place 2 sprays into the nose daily.   16 g   4   . FLUTICASONE-SALMETEROL 250-50 MCG/DOSE IN AEPB      Two puffs twice daily   120 each   5   . HYDROCODONE-ACETAMINOPHEN 5-325 MG PO TABS   Oral   Take 1  tablet by mouth every 6 (six) hours as needed. FOR KNEE PAIN         . LEVALBUTEROL TARTRATE 45 MCG/ACT IN AERO   Inhalation   Inhale 1-2 puffs into the lungs every 4 (four) hours as needed for wheezing.   1 Inhaler   12   . LEVOCETIRIZINE DIHYDROCHLORIDE 5 MG PO TABS   Oral   Take 1 tablet (5 mg total) by mouth every evening.   30 tablet   4   . MONTELUKAST SODIUM 10 MG PO TABS   Oral   Take 10 mg by mouth every morning.         Marland Kitchen OMEPRAZOLE 20 MG PO CPDR   Oral   Take 20 mg by mouth daily.           BP 124/80  Pulse 80  Temp 98.1 F (36.7 C) (Oral)  Resp 19  Ht 5\' 6"  (1.676 m)  Wt 170 lb (77.111 kg)  BMI 27.44 kg/m2  SpO2 99%  LMP 05/17/2012  Physical Exam  Nursing note and vitals reviewed. Constitutional: She is oriented to person, place, and time.       Middle-aged woman who appears younger than her stated age, in no distress at rest.  HENT:  Head: Normocephalic and atraumatic.  Right Ear: External ear normal.  Left Ear: External ear normal.  Mouth/Throat: Oropharynx is clear and moist.  Eyes: Conjunctivae normal and EOM are normal. Pupils are equal, round, and reactive to light. No scleral icterus.  Neck: Normal range of motion. Neck supple.  Cardiovascular: Normal rate, regular rhythm and normal heart sounds.   Pulmonary/Chest: Effort normal and breath sounds normal.  Abdominal: Soft. Bowel sounds are normal. She exhibits no distension and no mass. There is no tenderness. There is no rebound.       She localizes pain to the right upper quadrant of her abdomen. There is no mass or tenderness to palpation.  Musculoskeletal: Normal range of motion. She exhibits no edema and no tenderness.  Lymphadenopathy:    She has no cervical adenopathy.  Neurological: She is alert and oriented to person, place, and time.       No sensory or motor deficits.  Skin: Skin is warm and dry.  Psychiatric: She has a normal mood and affect. Her behavior is normal.    ED  Course  Procedures (including critical care time)   Labs Reviewed  URINALYSIS, ROUTINE W REFLEX MICROSCOPIC  CBC WITH DIFFERENTIAL  LIPASE, BLOOD  COMPREHENSIVE METABOLIC PANEL   Nm Hepato W/eject Fract  10/18/2012  *RADIOLOGY REPORT*  Clinical Data: Upper abdominal pain  NUCLEAR MEDICINE HEPATOBILIARY WITH GB, PHARM AND  QUAN MEASURE/ejection fraction analysis  Radiopharmaceutical:  Technetium 52m mebrofenin  Dose:  5.0 mCi  Route of administration:  Intravenous  Views:  Anterior right upper quadrant  Findings: Liver uptake of radiotracer is normal.  There is prompt visualization of gallbladder and small bowel, indicating patency of the cystic and common bile ducts.  Ensure Plus was administered orally with calculation of the computer-generated ejection fraction of radiotracer from the gallbladder.  The ejection fraction of radiotracer from the gallbladder is normal at 66.7%, normal greater than 33% using the oral agent.  IMPRESSION: Normal study.   Original Report Authenticated By: Bretta Bang, M.D.    6:20 PM  Patient was seen and had physical examination. Imaging results were reviewed. Laboratory tests and CT of the abdomen and pelvis with oral and IV contrast were ordered. The patient did not want to take pain medication at the present time.  7:32 PM Pt now complaining of pain.  IV Dilaudid and Zofran ordered.  8:00 PM Results for orders placed during the hospital encounter of 10/19/12  URINALYSIS, ROUTINE W REFLEX MICROSCOPIC      Component Value Range   Color, Urine YELLOW  YELLOW   APPearance CLEAR  CLEAR   Specific Gravity, Urine 1.025  1.005 - 1.030   pH 5.5  5.0 - 8.0   Glucose, UA NEGATIVE  NEGATIVE mg/dL   Hgb urine dipstick TRACE (*) NEGATIVE   Bilirubin Urine NEGATIVE  NEGATIVE   Ketones, ur TRACE (*) NEGATIVE mg/dL   Protein, ur NEGATIVE  NEGATIVE mg/dL   Urobilinogen, UA 0.2  0.0 - 1.0 mg/dL   Nitrite NEGATIVE  NEGATIVE   Leukocytes, UA NEGATIVE  NEGATIVE  CBC  WITH DIFFERENTIAL      Component Value Range   WBC 6.5  4.0 - 10.5 K/uL   RBC 4.32  3.87 - 5.11 MIL/uL   Hemoglobin 12.9  12.0 - 15.0 g/dL   HCT 40.9  81.1 - 91.4 %   MCV 86.1  78.0 - 100.0 fL   MCH 29.9  26.0 - 34.0 pg   MCHC 34.7  30.0 - 36.0 g/dL   RDW 78.2  95.6 - 21.3 %   Platelets 308  150 - 400 K/uL   Neutrophils Relative 59  43 - 77 %   Neutro Abs 3.8  1.7 - 7.7 K/uL   Lymphocytes Relative 29  12 - 46 %   Lymphs Abs 1.9  0.7 - 4.0 K/uL   Monocytes Relative 10  3 - 12 %   Monocytes Absolute 0.6  0.1 - 1.0 K/uL   Eosinophils Relative 2  0 - 5 %   Eosinophils Absolute 0.1  0.0 - 0.7 K/uL   Basophils Relative 1  0 - 1 %   Basophils Absolute 0.0  0.0 - 0.1 K/uL  LIPASE, BLOOD      Component Value Range   Lipase 12  11 - 59 U/L  COMPREHENSIVE METABOLIC PANEL      Component Value Range   Sodium 136  135 - 145 mEq/L   Potassium 3.7  3.5 - 5.1 mEq/L   Chloride 99  96 - 112 mEq/L   CO2 28  19 - 32 mEq/L   Glucose, Bld 85  70 - 99 mg/dL   BUN 10  6 - 23 mg/dL   Creatinine, Ser 0.86  0.50 - 1.10 mg/dL   Calcium 57.8  8.4 - 46.9 mg/dL   Total Protein 7.7  6.0 - 8.3 g/dL   Albumin  4.4  3.5 - 5.2 g/dL   AST 24  0 - 37 U/L   ALT 19  0 - 35 U/L   Alkaline Phosphatase 78  39 - 117 U/L   Total Bilirubin 0.7  0.3 - 1.2 mg/dL   GFR calc non Af Amer 83 (*) >90 mL/min   GFR calc Af Amer >90  >90 mL/min  URINE MICROSCOPIC-ADD ON      Component Value Range   Squamous Epithelial / LPF FEW (*) RARE   WBC, UA 0-2  <3 WBC/hpf   RBC / HPF 0-2  <3 RBC/hpf   Bacteria, UA RARE  RARE   Ct Abdomen Pelvis W Contrast  10/19/2012  *RADIOLOGY REPORT*  Clinical Data: 37 year old female with right abdominal and pelvic pain.  Nausea.  CT ABDOMEN AND PELVIS WITH CONTRAST  Technique:  Multidetector CT imaging of the abdomen and pelvis was performed following the standard protocol during bolus administration of intravenous contrast.  Contrast: OMNIPAQUE IOHEXOL 300 MG/ML SOLN  Comparison:  11/30/2007 CT  Findings: The lung bases are clear.  Hepatic cysts are again noted but the liver is otherwise unremarkable. The spleen, pancreas, gallbladder, adrenal glands, and kidneys are unremarkable except for a tiny right renal cyst.  No free fluid, enlarged lymph nodes, biliary dilation or abdominal aortic aneurysm identified.  The bowel, appendix and bladder are unremarkable. The patient is status post hysterectomy. No masses, bowel obstruction or pneumoperitoneum noted.  No acute or suspicious bony abnormalities are identified.  IMPRESSION: No evidence of acute or significant abnormality.   Original Report Authenticated By: Harmon Pier, M.D.    8:00 PM Lab tests and CT of abdomen/pelvis with oral and IV contrast were all reassuringly normal.  She most likely has peptic ulcer disease.  Will Rx with Protonix 40 mg qd and refer her to Dr. Jena Gauss for upper endoscopy.      1. Peptic ulcer disease           Carleene Cooper III, MD 10/19/12 2011

## 2012-10-19 NOTE — ED Notes (Signed)
Pain to RUQ x 2 weeks. Described as dull ache with intermittent sharp pain. Had gallbladder US which was normal by PMD. Also to pain to right groin area. Nausea at times. Denies vomiting. States pain gets worse after eating. Also had gallbladder function test  Yesterday which was normal, per pt.

## 2012-10-19 NOTE — ED Notes (Signed)
Pt c/o pain and EDP notified and orders received. Pt was given pain/nausea meds and assisted to the restroom

## 2012-10-19 NOTE — Telephone Encounter (Signed)
Patient aware and needs to schedule appt because still having pain

## 2012-10-20 NOTE — Telephone Encounter (Signed)
Patient aware.

## 2012-10-21 ENCOUNTER — Ambulatory Visit (INDEPENDENT_AMBULATORY_CARE_PROVIDER_SITE_OTHER): Payer: Managed Care, Other (non HMO) | Admitting: Family Medicine

## 2012-10-21 ENCOUNTER — Encounter: Payer: Self-pay | Admitting: Family Medicine

## 2012-10-21 VITALS — BP 140/88 | HR 85 | Resp 18 | Ht 66.0 in | Wt 167.1 lb

## 2012-10-21 DIAGNOSIS — J45909 Unspecified asthma, uncomplicated: Secondary | ICD-10-CM

## 2012-10-21 DIAGNOSIS — R52 Pain, unspecified: Secondary | ICD-10-CM

## 2012-10-21 DIAGNOSIS — R109 Unspecified abdominal pain: Secondary | ICD-10-CM

## 2012-10-21 NOTE — Telephone Encounter (Signed)
Patient has appointment 10/21/2012

## 2012-10-21 NOTE — Progress Notes (Signed)
  Subjective:    Patient ID: Jodi [name], female    DOB: 1976-07-14, 37 y.o.   MRN: [id number]  HPI Pt reports 2 week h/o Right sided abdominal abdominal pain which is worsening. Extends from mid abdomen, up to RUQ and right groin. Pain wakes up pt at times in the past 2 days, was in the Ed on 01/07 BM down to one per day, having nausea, no vomit , excess belching Food makes the pain worse, fruit is ok No fever , chills , urinary symptoms, no change in bowel movement noted   Review of Systems See HPI Denies recent fever or chills. Denies sinus pressure, nasal congestion, ear pain or sore throat. Denies chest congestion, productive cough or wheezing. Denies chest pains, palpitations and leg swelling Denies abdominal pain, nausea, vomiting,diarrhea or constipatio.   Denies dysuria, frequency, hesitancy or incontinence. Denies joint pain, swelling and limitation in mobility.       Objective:   Physical Exam Patient alert and oriented and in no cardiopulmonary distress.Pt in pain  HEENT: No facial asymmetry, EOMI, no sinus tenderness,  oropharynx pink and moist.  Neck supple no adenopathy.  Chest: Clear to auscultation bilaterally.  CVS: S1, S2 no murmurs, no S3.  ABD: Soft upper and lower tenderness, also mild epigastric tenderness, no rebound or guarding Ext: No edema  MS: Adequate ROM spine, shoulders, hips and knees.  Skin: Intact, no ulcerations or rash noted.  Psych: Good eye contact, normal affect. Memory intact not anxious or depressed appearing.  CNS: CN 2-12 intact, power, tone and sensation normal throughout.        Assessment & Plan:

## 2012-10-21 NOTE — Patient Instructions (Addendum)
F/u as before.  I have spoken directly with Dr. Marline Backbone, and he will see you next week Monday or Tuesday, his office will call you  I hope that you feel better soon

## 2012-10-22 ENCOUNTER — Other Ambulatory Visit (INDEPENDENT_AMBULATORY_CARE_PROVIDER_SITE_OTHER): Payer: Self-pay | Admitting: *Deleted

## 2012-10-22 ENCOUNTER — Encounter (HOSPITAL_COMMUNITY)
Admission: RE | Disposition: A | Discharge status: Home / Self Care | Payer: Self-pay | Source: Ambulatory Visit | Attending: Internal Medicine

## 2012-10-22 ENCOUNTER — Ambulatory Visit (INDEPENDENT_AMBULATORY_CARE_PROVIDER_SITE_OTHER): Payer: 59 | Admitting: Internal Medicine

## 2012-10-22 ENCOUNTER — Encounter (INDEPENDENT_AMBULATORY_CARE_PROVIDER_SITE_OTHER): Payer: Self-pay | Admitting: Internal Medicine

## 2012-10-22 ENCOUNTER — Encounter (HOSPITAL_COMMUNITY): Payer: Self-pay | Admitting: *Deleted

## 2012-10-22 ENCOUNTER — Ambulatory Visit (HOSPITAL_COMMUNITY)
Admission: RE | Admit: 2012-10-22 | Discharge: 2012-10-22 | Disposition: A | Discharge status: Home / Self Care | Payer: Managed Care, Other (non HMO) | Source: Ambulatory Visit | Attending: Internal Medicine | Admitting: Internal Medicine

## 2012-10-22 VITALS — BP 118/70 | HR 76 | Temp 98.0°F | Ht 66.0 in | Wt 167.3 lb

## 2012-10-22 DIAGNOSIS — R1011 Right upper quadrant pain: Secondary | ICD-10-CM

## 2012-10-22 DIAGNOSIS — R109 Unspecified abdominal pain: Secondary | ICD-10-CM

## 2012-10-22 DIAGNOSIS — R1013 Epigastric pain: Secondary | ICD-10-CM

## 2012-10-22 DIAGNOSIS — K589 Irritable bowel syndrome without diarrhea: Secondary | ICD-10-CM

## 2012-10-22 HISTORY — PX: ESOPHAGOGASTRODUODENOSCOPY: SHX5428

## 2012-10-22 HISTORY — DX: Irritable bowel syndrome, unspecified: K58.9

## 2012-10-22 HISTORY — DX: Gastro-esophageal reflux disease without esophagitis: K21.9

## 2012-10-22 HISTORY — DX: Hyperlipidemia, unspecified: E78.5

## 2012-10-22 HISTORY — DX: Shortness of breath: R06.02

## 2012-10-22 SURGERY — EGD (ESOPHAGOGASTRODUODENOSCOPY)
Anesthesia: Moderate Sedation

## 2012-10-22 MED ORDER — DICYCLOMINE HCL 10 MG PO CAPS: 10.0000 mg | ORAL_CAPSULE | Freq: Three times a day (TID) | ORAL | Status: DC

## 2012-10-22 MED ORDER — MEPERIDINE HCL 25 MG/ML IJ SOLN
INTRAMUSCULAR | Status: DC | PRN
  Administered 2012-10-22 (×2): 25 mg via INTRAVENOUS

## 2012-10-22 MED ORDER — STERILE WATER FOR IRRIGATION IR SOLN
Status: DC | PRN
  Administered 2012-10-22: 13:00:00

## 2012-10-22 MED ORDER — MIDAZOLAM HCL 5 MG/5ML IJ SOLN
INTRAMUSCULAR | Status: AC
  Filled 2012-10-22: qty 10

## 2012-10-22 MED ORDER — SODIUM CHLORIDE 0.45 % IV SOLN
INTRAVENOUS | Status: DC
  Administered 2012-10-22: 13:00:00 via INTRAVENOUS

## 2012-10-22 MED ORDER — IBUPROFEN 400 MG PO TABS: 400.0000 mg | ORAL_TABLET | Freq: Three times a day (TID) | ORAL | Status: DC

## 2012-10-22 MED ORDER — MIDAZOLAM HCL 5 MG/5ML IJ SOLN
INTRAMUSCULAR | Status: DC | PRN
  Administered 2012-10-22 (×4): 2 mg via INTRAVENOUS

## 2012-10-22 MED ORDER — OMEPRAZOLE 20 MG PO CPDR: 20.0000 mg | DELAYED_RELEASE_CAPSULE | Freq: Every day | ORAL | Status: DC

## 2012-10-22 MED ORDER — OMEPRAZOLE 20 MG PO CPDR: 20.0000 mg | DELAYED_RELEASE_CAPSULE | Freq: Two times a day (BID) | ORAL | Status: DC

## 2012-10-22 MED ORDER — BUTAMBEN-TETRACAINE-BENZOCAINE 2-2-14 % EX AERO
INHALATION_SPRAY | CUTANEOUS | Status: DC | PRN
  Administered 2012-10-22: 2 via TOPICAL

## 2012-10-22 MED ORDER — MEPERIDINE HCL 50 MG/ML IJ SOLN
INTRAMUSCULAR | Status: AC
  Filled 2012-10-22: qty 1

## 2012-10-22 NOTE — Progress Notes (Signed)
Presenting complaint;  Persistent right upper quadrant and epigastric pain since 10/04/2012.  History of present illness;  Patient is 37 year old African female who is being evaluated at her request by Dr. Syliva Overman. She was in usual state of health until 10/04/2012 when she noted dull aching pain under the right costal margin. She was seen by Dr. Lodema Hong at a later. She had normal comprehensive chemistry panel and ultrasound was negative for cholelithiasis. This was followed by HIDA scan with [facility] and was also within normal limits. Her pain has been gradually progressive over the last 10 days. Pain radiates into her lower and upper back as well as shoulder. It also radiates into right lower quadrant. Now she describes this pain to be shooting sharp and constant pain. She has experienced nausea and diminished appetite. She also has noted worsening pain when she eats. She states she has lost 6 pounds. She has noted decrease in frequency of bowel movements. Her baseline is 3-4 stools per day. Now she is having 0-1 per day. She has had severe pain for the last 4 days. She went to the emergency room on 10/19/2012 and workup was negative. Her lab studies were normal and so was abdominopelvic CT. She was switched to pantoprazole. She does not feel that it's helping her. Two days ago she had another episode of severe pain and she took two pain pills which gave her some relief. She denies dysuria hematuria melena or rectal bleeding. She also denies skin rash or shortness of breath. She recalls she had chest pain and bubble in her chest when she drank Ensure for second part of HIDA scan. She does not recall that she had any physical injury or fall recently. She does horse riding periodically has never had any untoward events. She says her heartburn is well controlled with omeprazole. She does not take any NSAIDs.  Current Medications: Current Outpatient Prescriptions  Medication Sig Dispense Refill  .  albuterol (PROVENTIL) (2.5 MG/3ML) 0.083% nebulizer solution Take 3 mLs (2.5 mg total) by nebulization every 4 (four) hours. Use every 4 hours for the next 2 days; then every 4 hours as needed for wheezing, chest tightness, or shortness of breath.  75 mL  3  . albuterol (VENTOLIN HFA) 108 (90 BASE) MCG/ACT inhaler Inhale 2 puffs into the lungs every 4 (four) hours as needed.  1 Inhaler  3  . fluticasone (FLONASE) 50 MCG/ACT nasal spray Place 2 sprays into the nose daily.  16 g  4  . Fluticasone-Salmeterol (ADVAIR DISKUS) 250-50 MCG/DOSE AEPB Two puffs twice daily  120 each  5  . HYDROcodone-acetaminophen (NORCO) 5-325 MG per tablet Take 1 tablet by mouth every 6 (six) hours as needed. FOR KNEE PAIN      . levocetirizine (XYZAL) 5 MG tablet Take 1 tablet (5 mg total) by mouth every evening.  30 tablet  4  . montelukast (SINGULAIR) 10 MG tablet Take 10 mg by mouth every morning.      . pantoprazole (PROTONIX) 20 MG tablet Take 1 tablet (20 mg total) by mouth daily.  40 tablet  15   Past medical history; History of asthma since childhood. Chronic GERD. She has been on PPI for 2 years. She has mild arthritis involving her right knee and elbows. She had left knee injury with anterior cruciate ligament tear and may 2012 treated medically. BTL. Laparoscopy x2 with removal of ovarian cysts. She had clots removed from her left groin about 13 years ago. Hysterectomy with right  oophorectomy about 5 years ago.  Allergies; Compazine resulting in lingual and facial swelling.  Family history; Father is 10 and has hypertension and history of colonic polyps. Mother is 56 and has asthma osteoarthrosis and hypertension. She lost brother secondary to auto accident in this 20,s. She has 3 sisters. One has lupus and one has type 2 diabetes. Social history; She is married. Her husband is with her .She has 3 children. Son has asthma. She works at [employer] and her job not sternum this or  physical. She smokes cigarettes for 10 years but 6-10 per day but quit in February 2010. She does not drink alcohol.  Objective: Blood pressure 118/70, pulse 76, temperature 98 F (36.7 C), height 5\' 6"  (1.676 m), weight 167 lb 4.8 oz (75.887 kg), last menstrual period 05/17/2012. Patient is alert and in no acute distress. Conjunctiva is pink. Sclera is nonicteric Oropharyngeal mucosa is normal. No neck masses or thyromegaly noted. Wall tenderness noted in the right infrascapular area. Cardiac exam with regular rhythm normal S1 and S2. No murmur or gallop noted. Lungs are clear to auscultation. Abdomen. Tattoos noted at right mid abdomen. Bowel sounds are normal. Abdomen is soft with mild to moderate midepigastric tenderness as well as mild tenderness at right upper quadrant. No organomegaly or masses. Tactile sensation symmetrical in infrascapular areas although she tickles on the left side.  No LE edema or clubbing noted.  Labs/studies Results: CBC with differential, comprehensive chemistry panel results noted from 10/19/2012. All of these are within normal limits. Serum lipase was 12. Serum amylase, lipase and she met from 10/05/2012 also reviewed and within normal limits. Ultrasound from 10/05/2012 negative for cholelithiasis. CBD measured 3.4 mm. HIDA scan from 10/07/2012 revealed EF of 66.7% Abdominopelvic CT with contrast from 10/19/2012(ER visit) reviewed. Small hepatic cyst otherwise normal study.  Assessment:  Patient is 37 year old African female who presents with 2-1/2 week history of right upper quadrant and epigastric pain radiating inferiorly into right low quadrant as well  to lumbar region and all the way to her shoulder. On exam she is tender over posterior chest wall as well as in the epigastrium and right upper quadrant. Most of her symptoms are suggestive of gallbladder disease but there is no evidence of cholelithiasis, thickened gallbladder wall or low ejection  fraction. She may also have irritable bowel syndrome but this would not explain her symptom complex.  Since she is tender in epigastric region and has nausea and 3 imaging studies are negative it would be reasonable to examine her upper GI tract and rule out peptic ulcer disease.  Recommendations; Hemoccult x1. Discontinue pantoprazole. Omeprazole 20 mg by mouth twice a day. Dicyclomine 10 mg by mouth 3 times a day. Diagnostic esophagogastroduodenoscopy possibly later today.

## 2012-10-22 NOTE — Patient Instructions (Addendum)
Hemoccults x1.  EGD to be scheduled.

## 2012-10-22 NOTE — H&P (Signed)
Jodi Hippo, MD 10/22/2012 1:03 PM Signed  Presenting complaint;  Persistent right upper quadrant and epigastric pain since 10/04/2012.  History of present illness;  Patient is 36 year old African female who is being evaluated at her request by Dr. Syliva Overman. She was in usual state of health until 10/04/2012 when she noted dull aching pain under the right costal margin. She was seen by Dr. Lodema Hong at a later. She had normal comprehensive chemistry panel and ultrasound was negative for cholelithiasis. This was followed by HIDA scan with CCK and was also within normal limits. Her pain has been gradually progressive over the last 10 days. Pain radiates into her lower and upper back as well as shoulder. It also radiates into right lower quadrant. Now she describes this pain to be shooting sharp and constant pain. She has experienced nausea and diminished appetite. She also has noted worsening pain when she eats. She states she has lost 6 pounds. She has noted decrease in frequency of bowel movements. Her baseline is 3-4 stools per day. Now she is having 0-1 per day.  She has had severe pain for the last 4 days. She went to the emergency room on 10/19/2012 and workup was negative. Her lab studies were normal and so was abdominopelvic CT. She was switched to pantoprazole. She does not feel that it's helping her. Two days ago she had another episode of severe pain and she took two pain pills which gave her some relief. She denies dysuria hematuria melena or rectal bleeding. She also denies skin rash or shortness of breath. She recalls she had chest pain and bubble in her chest when she drank Ensure for second part of HIDA scan. She does not recall that she had any physical injury or fall recently. She does horse riding periodically has never had any untoward events. She says her heartburn is well controlled with omeprazole. She does not take any NSAIDs.  Current Medications:  Current Outpatient  Prescriptions   Medication  Sig  Dispense  Refill   .  albuterol (PROVENTIL) (2.5 MG/3ML) 0.083% nebulizer solution  Take 3 mLs (2.5 mg total) by nebulization every 4 (four) hours. Use every 4 hours for the next 2 days; then every 4 hours as needed for wheezing, chest tightness, or shortness of breath.  75 mL  3   .  albuterol (VENTOLIN HFA) 108 (90 BASE) MCG/ACT inhaler  Inhale 2 puffs into the lungs every 4 (four) hours as needed.  1 Inhaler  3   .  fluticasone (FLONASE) 50 MCG/ACT nasal spray  Place 2 sprays into the nose daily.  16 g  4   .  Fluticasone-Salmeterol (ADVAIR DISKUS) 250-50 MCG/DOSE AEPB  Two puffs twice daily  120 each  5   .  HYDROcodone-acetaminophen (NORCO) 5-325 MG per tablet  Take 1 tablet by mouth every 6 (six) hours as needed. FOR KNEE PAIN     .  levocetirizine (XYZAL) 5 MG tablet  Take 1 tablet (5 mg total) by mouth every evening.  30 tablet  4   .  montelukast (SINGULAIR) 10 MG tablet  Take 10 mg by mouth every morning.     .  pantoprazole (PROTONIX) 20 MG tablet  Take 1 tablet (20 mg total) by mouth daily.  40 tablet  15    Past medical history;  History of asthma since childhood.  Chronic GERD. She has been on PPI for 2 years.  She has mild arthritis involving her right knee and  elbows.  She had left knee injury with anterior cruciate ligament tear and may 2012 treated medically.  BTL.  Laparoscopy x2 with removal of ovarian cysts.  She had clots removed from her left groin about 13 years ago.  Hysterectomy with right oophorectomy about 5 years ago.  Allergies;  Compazine resulting in lingual and facial swelling.  Family history;  Father is 59 and has hypertension and history of colonic polyps.  Mother is 87 and has asthma osteoarthrosis and hypertension.  She lost brother secondary to auto accident in this 20,s.  She has 3 sisters. One has lupus and one has type 2 diabetes.  Social history;  She is married. Her husband is with her .She has 3 children. Son  has asthma.  She works at [employer] and her job not sternum this or physical.  She smokes cigarettes for 10 years but 6-10 per day but quit in February 2010. She does not drink alcohol.  Objective:  Blood pressure 118/70, pulse 76, temperature 98 F (36.7 C), height 5\' 6"  (1.676 m), weight 167 lb 4.8 oz (75.887 kg), last menstrual period 05/17/2012.  Patient is alert and in no acute distress.  Conjunctiva is pink. Sclera is nonicteric  Oropharyngeal mucosa is normal.  No neck masses or thyromegaly noted.  Wall tenderness noted in the right infrascapular area.  Cardiac exam with regular rhythm normal S1 and S2. No murmur or gallop noted.  Lungs are clear to auscultation.  Abdomen. Tattoos noted at right mid abdomen. Bowel sounds are normal. Abdomen is soft with mild to moderate midepigastric tenderness as well as mild tenderness at right upper quadrant. No organomegaly or masses.  Tactile sensation symmetrical in infrascapular areas although she tickles on the left side.  No LE edema or clubbing noted.  Labs/studies Results:  CBC with differential, comprehensive chemistry panel results noted from 10/19/2012. All of these are within normal limits.  Serum lipase was 12.  Serum amylase, lipase and she met from 10/05/2012 also reviewed and within normal limits.  Ultrasound from 10/05/2012 negative for cholelithiasis. CBD measured 3.4 mm.  HIDA scan from 10/07/2012 revealed EF of 66.7%  Abdominopelvic CT with contrast from 10/19/2012(ER visit) reviewed. Small hepatic cyst otherwise normal study.  Assessment:  Patient is 37 year old African female who presents with 2-1/2 week history of right upper quadrant and epigastric pain radiating inferiorly into right low quadrant as well to lumbar region and all the way to her shoulder. On exam she is tender over posterior chest wall as well as in the epigastrium and right upper quadrant. Most of her symptoms are suggestive of gallbladder disease  but there is no evidence of cholelithiasis, thickened gallbladder wall or low ejection fraction. She may also have irritable bowel syndrome but this would not explain her symptom complex.  Since she is tender in epigastric region and has nausea and 3 imaging studies are negative it would be reasonable to examine her upper GI tract and rule out peptic ulcer disease.  Recommendations;  Hemoccult x1.  Discontinue pantoprazole.  Omeprazole 20 mg by mouth twice a day.  Dicyclomine 10 mg by mouth 3 times a day.  Diagnostic esophagogastroduodenoscopy possibly later today.   Addendum.  Patient was seen in the earlier today and here for diagnostic EGD.

## 2012-10-22 NOTE — Op Note (Signed)
EGD PROCEDURE REPORT  PATIENT:  Jodi [name]  MR#:  [id number] Birthdate:  10/25/1975, 37 y.o., female Endoscopist:  Dr. Malissa Hippo, MD Referred By:  Dr. Syliva Overman, MD Procedure Date: 10/22/2012  Procedure:   EGD  Indications:  Patient is 37 year old African female who presents with over 2 week history of right upper quadrant epigastric pain associated with nausea inability to eat. Lab studies are normal including LFTs and serum calcium. Ultrasound is negative for cholelithiasis. Normal EF noted on HIDA scan and abdominopelvic CTs also unremarkable. She is undergoing diagnostic EGD.            Informed Consent:  The risks, benefits, alternatives & imponderables which include, but are not limited to, bleeding, infection, perforation, drug reaction and potential missed lesion have been reviewed.  The potential for biopsy, lesion removal, esophageal dilation, etc. have also been discussed.  Questions have been answered.  All parties agreeable.  Please see history & physical in medical record for more information.  Medications:  Demerol 50 mg IV Versed 8 mg IV Cetacaine spray topically for oropharyngeal anesthesia  Description of procedure:  The endoscope was introduced through the mouth and advanced to the second portion of the duodenum without difficulty or limitations. The mucosal surfaces were surveyed very carefully during advancement of the scope and upon withdrawal.  Findings:  Esophagus:  Normal mucosa of the esophagus. GE junction is unremarkable. GEJ:  39 cm Stomach:  Stomach was empty and distended very well with insufflation. Folds in the proximal stomach were normal. Examination of mucosa at body, antrum, pyloric channel, fundus and cardia was normal. Duodenum:  Normal bulbar and post bulbar mucosa.  Therapeutic/Diagnostic Maneuvers Performed:  None  Complications:  None  Impression: Normal esophagogastroduodenoscopy. Suspect pain multifactorial. We'll  reevaluate patient in one week. If she does not improve with therapy we will consider surgical consultation.  Recommendations:  Patient has been instructed to go back on omeprazole but take 20 mg by mouth twice a day. Dicyclomine 10 mg by mouth a.c.. Ibuprofen 400 mg by mouth 3 times a day and after each meal. Office visit in one week.  Neave Lenger U  10/22/2012  1:34 PM  CC: Dr. Syliva Overman, MD & Dr. Bonnetta Barry ref. provider found

## 2012-10-23 NOTE — Assessment & Plan Note (Signed)
Currently stable.

## 2012-10-23 NOTE — Assessment & Plan Note (Signed)
Ongoing and progressive abdominal pain x 2 weeks, despite multiple imaging studies no abnormality found Discussed directly with GI who will see her in the 3 days, which is appreciated

## 2012-10-26 ENCOUNTER — Encounter (HOSPITAL_COMMUNITY): Payer: Self-pay | Admitting: Internal Medicine

## 2012-10-27 ENCOUNTER — Telehealth (INDEPENDENT_AMBULATORY_CARE_PROVIDER_SITE_OTHER): Payer: Self-pay | Admitting: *Deleted

## 2012-10-27 ENCOUNTER — Other Ambulatory Visit (INDEPENDENT_AMBULATORY_CARE_PROVIDER_SITE_OTHER): Payer: Self-pay | Admitting: *Deleted

## 2012-10-27 DIAGNOSIS — Z1211 Encounter for screening for malignant neoplasm of colon: Secondary | ICD-10-CM

## 2012-10-27 DIAGNOSIS — R1011 Right upper quadrant pain: Secondary | ICD-10-CM

## 2012-10-27 DIAGNOSIS — R195 Other fecal abnormalities: Secondary | ICD-10-CM

## 2012-10-27 MED ORDER — PEG-KCL-NACL-NASULF-NA ASC-C 100 G PO SOLR: 1.0000 | Freq: Once | ORAL | Status: DC

## 2012-10-27 NOTE — Telephone Encounter (Signed)
Patient needs movi prep 

## 2012-10-27 NOTE — Telephone Encounter (Signed)
Let us bring Jodi [name] for office visit tomorrow or Friday morning

## 2012-10-27 NOTE — Telephone Encounter (Signed)
Patient was called and states that she was feeling better but that she is feeling worse. Continues to have the sharp pain Right side around her Gall Bladder Every time she eats she is having Indigestion and gas , this is all the time. Patient did return a hemoccult card x1 earlier this week and it was positive. Patient was advised that Dr.Rehman may want to see her in the office tomorrow or Friday and that we would call her back with the date and time.

## 2012-10-27 NOTE — Telephone Encounter (Signed)
Jodi [name]- Patient was called and made aware that you would be calling her to arrange the Colonoscopy. Any day is fine but prefers a Friday

## 2012-10-27 NOTE — Telephone Encounter (Signed)
TCS sch'd 11/04/12 @ 915, patient aware

## 2012-10-27 NOTE — Telephone Encounter (Signed)
Per Dr Karilyn Cota no need for OV since already sch'd for TCS on 11/04/12

## 2012-10-27 NOTE — Telephone Encounter (Signed)
Per Dr.Rehman he would like to precede with a Colonoscopy to look at her Colon. Patient to be made aware

## 2012-10-28 ENCOUNTER — Encounter (HOSPITAL_COMMUNITY): Payer: Self-pay | Admitting: Pharmacy Technician

## 2012-10-29 ENCOUNTER — Encounter: Payer: Self-pay | Admitting: Family Medicine

## 2012-10-29 ENCOUNTER — Telehealth: Payer: Self-pay | Admitting: Family Medicine

## 2012-10-29 NOTE — Telephone Encounter (Signed)
Noted and printed.  

## 2012-10-29 NOTE — Telephone Encounter (Signed)
Is her form ready? Its not up front

## 2012-10-29 NOTE — Telephone Encounter (Signed)
Paperwork for [employer] only has been completed, pls fax along with the reports.(four) Abd Ct scan, RUQ ultrasound, hida scan and upper endo Pt is aware she needs to collect other forms left for teva  Thanks

## 2012-10-29 NOTE — Telephone Encounter (Signed)
pT LEFT WORK ON jAN 6 DUE TO ABD Pain, was seen in the Ed Jan 7, taken oout from 01/7 to 01/11 by ED. Had upper endo 01/10 and is scheduled for colonoscopy 01/23, by dr Karilyn Cota. Tates work has her returning, 01/26 at his time. States pain is unchanged thoug on meds from GI. States she was told she would be referred fro gall bladder surgery , high psssibility

## 2012-11-02 ENCOUNTER — Ambulatory Visit: Payer: Self-pay | Admitting: Urgent Care

## 2012-11-04 ENCOUNTER — Ambulatory Visit (HOSPITAL_COMMUNITY)
Admission: RE | Admit: 2012-11-04 | Discharge: 2012-11-04 | Disposition: A | Discharge status: Home / Self Care | Payer: Managed Care, Other (non HMO) | Source: Ambulatory Visit | Attending: Internal Medicine | Admitting: Internal Medicine

## 2012-11-04 ENCOUNTER — Encounter (HOSPITAL_COMMUNITY)
Admission: RE | Disposition: A | Discharge status: Home / Self Care | Payer: Self-pay | Source: Ambulatory Visit | Attending: Internal Medicine

## 2012-11-04 ENCOUNTER — Telehealth: Payer: Self-pay | Admitting: Family Medicine

## 2012-11-04 ENCOUNTER — Encounter (HOSPITAL_COMMUNITY): Payer: Self-pay

## 2012-11-04 ENCOUNTER — Encounter (INDEPENDENT_AMBULATORY_CARE_PROVIDER_SITE_OTHER): Payer: Self-pay

## 2012-11-04 DIAGNOSIS — R1011 Right upper quadrant pain: Secondary | ICD-10-CM

## 2012-11-04 DIAGNOSIS — K921 Melena: Secondary | ICD-10-CM

## 2012-11-04 DIAGNOSIS — K644 Residual hemorrhoidal skin tags: Secondary | ICD-10-CM

## 2012-11-04 DIAGNOSIS — R195 Other fecal abnormalities: Secondary | ICD-10-CM

## 2012-11-04 DIAGNOSIS — R109 Unspecified abdominal pain: Secondary | ICD-10-CM | POA: Insufficient documentation

## 2012-11-04 DIAGNOSIS — K5730 Diverticulosis of large intestine without perforation or abscess without bleeding: Secondary | ICD-10-CM | POA: Insufficient documentation

## 2012-11-04 DIAGNOSIS — R110 Nausea: Secondary | ICD-10-CM | POA: Insufficient documentation

## 2012-11-04 HISTORY — PX: COLONOSCOPY: SHX5424

## 2012-11-04 SURGERY — COLONOSCOPY
Anesthesia: Moderate Sedation

## 2012-11-04 MED ORDER — MEPERIDINE HCL 50 MG/ML IJ SOLN
INTRAMUSCULAR | Status: DC | PRN
  Administered 2012-11-04 (×2): 25 mg via INTRAVENOUS

## 2012-11-04 MED ORDER — IBUPROFEN 400 MG PO TABS: 400.0000 mg | ORAL_TABLET | Freq: Three times a day (TID) | ORAL | Status: DC | PRN

## 2012-11-04 MED ORDER — SODIUM CHLORIDE 0.45 % IV SOLN
INTRAVENOUS | Status: DC
  Administered 2012-11-04: 09:00:00 via INTRAVENOUS

## 2012-11-04 MED ORDER — STERILE WATER FOR IRRIGATION IR SOLN
Status: DC | PRN
  Administered 2012-11-04: 09:00:00

## 2012-11-04 MED ORDER — MIDAZOLAM HCL 5 MG/5ML IJ SOLN
INTRAMUSCULAR | Status: DC | PRN
  Administered 2012-11-04 (×5): 2 mg via INTRAVENOUS

## 2012-11-04 MED ORDER — MIDAZOLAM HCL 5 MG/5ML IJ SOLN
INTRAMUSCULAR | Status: AC
  Filled 2012-11-04: qty 10

## 2012-11-04 MED ORDER — MEPERIDINE HCL 50 MG/ML IJ SOLN
INTRAMUSCULAR | Status: AC
  Filled 2012-11-04: qty 1

## 2012-11-04 NOTE — Op Note (Signed)
COLONOSCOPY PROCEDURE REPORT  PATIENT:  Jodi [name]  MR#:  [id number] Birthdate:  [birth year], 37 y.o., female Endoscopist:  Dr. Malissa Hippo, MD Referred By:  Dr. Syliva Overman, MD Procedure Date: 11/04/2012  Procedure:   Colonoscopy  Indications:  Patient is 37 year old African female with right-sided abdominal pain who also has heme-positive stools. She is undergoing diagnostic colonoscopy.  Informed Consent:  The procedure and risks were reviewed with the patient and informed consent was obtained.  Medications:  Demerol 50 mg IV Versed 10 mg IV  Description of procedure:  After a digital rectal exam was performed, that colonoscope was advanced from the anus through the rectum and colon to the area of the cecum, ileocecal valve and appendiceal orifice. The cecum was deeply intubated. These structures were well-seen and photographed for the record. From the level of the cecum and ileocecal valve, the scope was slowly and cautiously withdrawn. The mucosal surfaces were carefully surveyed utilizing scope tip to flexion to facilitate fold flattening as needed. The scope was pulled down into the rectum where a thorough exam including retroflexion was performed. Minimal ileum was also examined. Findings:   Prep excellent. Normal mucosa of terminal ileum. Few scattered diverticula throughout the colon. Normal mucosa of the colon and rectum. Small hemorrhoids below the dentate line.  Therapeutic/Diagnostic Maneuvers Performed:  None  Complications:  None  Cecal Withdrawal Time:  9 minutes  Impression:  Normal terminal ileum. Few scattered diverticula throughout colon. No evidence of endoscopic colitis or polyps. Small external hemorrhoids.  Recommendations:  Standard instructions given. Surgical consultation with Dr. Lovell Sheehan.Marland Kitchen  Tascha Casares U  11/04/2012 9:50 AM  CC: Dr. Syliva Overman, MD & Dr. Bonnetta Barry ref. provider found

## 2012-11-04 NOTE — H&P (Signed)
Jodi [name] is an 37 y.o. female.   Chief Complaint: Patient is here for colonoscopy. HPI: Patient is 37 year old African female who has undergone extensive evaluation for right-sided abdominal pain as well as nausea. She has undergone multiple studies including ultrasound, HIDA scan, abdominal pelvic CT as well as EGD. One of these studies been essentially unremarkable. Lab studies have also been normal. She was found to have heme positive stool and therefore undergoing diagnostic colonoscopy. Family history is negative for colorectal carcinoma her father has had multiple polyps removed over the years. Patient remains with intermittent pain in right upper and lower quadrant seem to get worse with fatty meals. She has no difficulty with fruit.  Past Medical History  Diagnosis Date  . Asthma   . DVT (deep venous thrombosis)   . Asthma   . Allergic rhinitis   . 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 Hyperlipidemia   . Shortness of breath     SOB even without exertion at times  . GERD (gastroesophageal reflux disease)   . IBS (irritable bowel syndrome)     Past Surgical History  Procedure Date  . Tubal ligation 1999  . Blood clot removed from lower abdomen 2000  . Cyst removed from ovary and fluid removed from tubes 2000  . Partial hysterectomy 2009  . Abdominal hysterectomy   . Esophagogastroduodenoscopy 10/22/2012    Procedure: ESOPHAGOGASTRODUODENOSCOPY (EGD);  Surgeon: Jodi Hippo, MD;  Location: [facility];  Service: Endoscopy;  Laterality: N/A;  100    Family History  Problem Relation Age of Onset  . Asthma Mother   . Hypertension Mother   . Bronchiolitis Mother   . Hypertension Father   . Hyperlipidemia Father     recurrent rectum polyps   . Diabetes Sister   . Lupus Sister   . Depression Sister    Social History:  reports that she has quit smoking. Her smoking use included Cigarettes. She has a 2.5 pack-year smoking history. She does not have any  smokeless tobacco history on file. She reports that she does not drink alcohol or use illicit drugs.  Allergies:  Allergies  Allergen Reactions  . Compazine Other (See Comments)    Face swells, tongue sticks out  . Compazine (Prochlorperazine Edisylate)     Medications Prior to Admission  Medication Sig Dispense Refill  . albuterol (PROVENTIL) (2.5 MG/3ML) 0.083% nebulizer solution Take 3 mLs (2.5 mg total) by nebulization every 4 (four) hours. Use every 4 hours for the next 2 days; then every 4 hours as needed for wheezing, chest tightness, or shortness of breath.  75 mL  3  . albuterol (VENTOLIN HFA) 108 (90 BASE) MCG/ACT inhaler Inhale 2 puffs into the lungs every 4 (four) hours as needed.  1 Inhaler  3  . dicyclomine (BENTYL) 10 MG capsule Take 1 capsule (10 mg total) by mouth 3 (three) times daily before meals.  90 capsule  2  . fluticasone (FLONASE) 50 MCG/ACT nasal spray Place 2 sprays into the nose daily.  16 g  4  . Fluticasone-Salmeterol (ADVAIR DISKUS) 250-50 MCG/DOSE AEPB Two puffs twice daily  120 each  5  . HYDROcodone-acetaminophen (NORCO) 5-325 MG per tablet Take 1 tablet by mouth every 6 (six) hours as needed. FOR KNEE PAIN      . ibuprofen (ADVIL) 400 MG tablet Take 1 tablet (400 mg total) by mouth 3 (three) times daily after meals.  60 tablet  0  .  levocetirizine (XYZAL) 5 MG tablet Take 1 tablet (5 mg total) by mouth every evening.  30 tablet  4  . montelukast (SINGULAIR) 10 MG tablet Take 10 mg by mouth every morning.      Marland Kitchen omeprazole (PRILOSEC) 20 MG capsule Take 1 capsule (20 mg total) by mouth 2 (two) times daily.  60 capsule  3    No results found for this or any previous visit (from the past 48 hour(s)). No results found.  ROS  Blood pressure 121/83, pulse 83, temperature 98.4 F (36.9 C), temperature source Oral, resp. rate 16, last menstrual period 05/17/2012, SpO2 98.00%. Physical Exam  Constitutional: She appears well-developed and well-nourished.    HENT:  Mouth/Throat: Oropharynx is clear and moist.  Eyes: Conjunctivae normal are normal. No scleral icterus.  Neck: No thyromegaly present.  Cardiovascular: Normal rate, regular rhythm and normal heart sounds.   No murmur heard. Respiratory: Effort normal and breath sounds normal.  GI: Soft. She exhibits no distension and no mass.       Mild tenderness at mid epigastrium, right upper quadrant and right low quadrant  Musculoskeletal: She exhibits no edema.  Lymphadenopathy:    She has no cervical adenopathy.  Neurological: She is alert.  Skin: Skin is warm and dry.     Assessment/Plan Right-sided abdominal pain and heme positive stool. Diagnostic colonoscopy.  Jodi [name] U 11/04/2012, 9:10 AM

## 2012-11-05 ENCOUNTER — Telehealth: Payer: Self-pay | Admitting: Family Medicine

## 2012-11-05 NOTE — Telephone Encounter (Signed)
See previous message

## 2012-11-05 NOTE — Telephone Encounter (Signed)
pls write note stating pt has further evaluation by surgery on date stated and her return to work will be determined by the Careers adviser . I will sign , she can collect this next week

## 2012-11-05 NOTE — Telephone Encounter (Signed)
Had colonoscopy and it was normal. Dr Karilyn Cota referred her to surgeon Lovell Sheehan on 28th at 3:00p) Needs a letter stating that she has been referred to surgeon and can't go back until work until after surgery.

## 2012-11-08 ENCOUNTER — Encounter (HOSPITAL_COMMUNITY): Payer: Self-pay | Admitting: Internal Medicine

## 2012-11-08 NOTE — Telephone Encounter (Signed)
Patient aware and gave me fax number to send it to

## 2012-11-09 ENCOUNTER — Encounter (HOSPITAL_COMMUNITY)
Admission: RE | Admit: 2012-11-09 | Discharge: 2012-11-09 | Disposition: A | Discharge status: Home / Self Care | Payer: Managed Care, Other (non HMO) | Source: Ambulatory Visit | Attending: General Surgery | Admitting: General Surgery

## 2012-11-09 ENCOUNTER — Encounter (HOSPITAL_COMMUNITY): Payer: Self-pay

## 2012-11-09 ENCOUNTER — Encounter (HOSPITAL_COMMUNITY): Payer: Self-pay | Admitting: Pharmacy Technician

## 2012-11-09 LAB — SURGICAL PCR SCREEN
MRSA, PCR: NEGATIVE
Staphylococcus aureus: NEGATIVE

## 2012-11-09 NOTE — H&P (Signed)
  NTS SOAP Note  Vital Signs:  Vitals as of: 11/09/2012: Systolic 135: Diastolic 82: Heart Rate 83: Temp 96.61F: Height 10ft 6in: Weight 166Lbs 0 Ounces: Pain Level 4: BMI 27  BMI : 26.79 kg/m2  Subjective: This 37 Years 39 Months old Female presents for of    ABDOMINAL ISSUES: ,Has been having right upper quadrant abdominal pain, nausea, vomiting, and fatty food intolerance for many months.  Workup has been negative.  CT scan showed no cholelithiasis.  HIDA scan showed normal ejection fraction but reproducible symptoms with CCK.  No fever, chills, jaundice.  Review of Symptoms:  Constitutional:unremarkable   Head:unremarkable    Eyes:unremarkable   Nose/Mouth/Throat:unremarkable Cardiovascular:  unremarkable   Respiratory:  wheezing,cough Gastrointestin    abdominal pain,nausea,vomiting,heartburn Genitourinary:unremarkable       joint and back pain dry, rash Hematolgic/Lymphatic:unremarkable     Allergic/Immunologic:unremarkable     Past Medical History:    Reviewed   Past Medical History  Surgical History: clot removal, BTL, partial hysterectomy Medical Problems: asthma Allergies: compazine Medications: singulair, xyzal, albuterol inhaler, flonase, prilosec, dicyclomine, hydrocodone   Social History:Reviewed  Social History  Preferred Language: English Race:  Black or African American Ethnicity: Not Hispanic / Latino Age: 37 Years 39 Months Marital Status:  D Alcohol:  No Recreational drug(s):  No   Smoking Status: Never smoker reviewed on 11/09/2012 Functional Status reviewed on mm/dd/yyyy ------------------------------------------------ Bathing: Normal Cooking: Normal Dressing: Normal Driving: Normal Eating: Normal Managing Meds: Normal Oral Care: Normal Shopping: Normal Toileting: Normal Transferring: Normal Walking: Normal Cognitive Status reviewed on  mm/dd/yyyy ------------------------------------------------ Attention: Normal Decision Making: Normal Language: Normal Memory: Normal Motor: Normal Perception: Normal Problem Solving: Normal Visual and Spatial: Normal   Family History:  Reviewed   Family History  Is there a family history ZO:XWRUEA cell, DM, high cholesterol, HTN, lupus    Objective Information: General:  Well appearing, well nourished in no distress.   no scleral icterus Heart:  RRR, no murmur Lungs:    CTA bilaterally, no wheezes, rhonchi, rales.  Breathing unlabored. Abdomen:Soft, NT/ND, no HSM, no masses.  Assessment:chronic cholecystitis  Diagnosis &amp; Procedure:    Plan:Scheduled for laparoscopic cholecystectomy on 11/12/12.   Patient Education:Alternative treatments to surgery were discussed with patient (and family).  Risks and benefits  of procedure including bleeding, infection, hepatobiliary injury, an open procedure, and the possibility of recurrence of the symptoms were fully explained to the patient (and family) who gave informed consent. Patient/family questions were addressed.  Follow-up:Pending Surgery

## 2012-11-09 NOTE — Patient Instructions (Addendum)
Jodi [name]  11/09/2012   Your procedure is scheduled on:  Friday, 11/12/12  Report to [facility] at [place] [facility].  Call this number if you have problems the morning of surgery: [phone]   Remember:   Do not eat food or drink liquids after midnight.   Take these medicines the morning of surgery with A SIP OF WATER: albuterol, advair, dicyclomine, omeprazole, singulair   Do not wear jewelry, make-up or nail polish.  Do not wear lotions, powders, or perfumes. You may wear deodorant.  Do not shave 48 hours prior to surgery. Men may shave face and neck.  Do not bring valuables to the hospital.  Contacts, dentures or bridgework may not be worn into surgery.  Leave suitcase in the car. After surgery it may be brought to your room.  For patients admitted to the hospital, checkout time is 11:00 AM the day of  discharge.   Patients discharged the day of surgery will not be allowed to drive  home.  Name and phone number of your driver: family  Special Instructions: Shower using CHG 2 nights before surgery and the night before surgery.  If you shower the day of surgery use CHG.  Use special wash - you have one bottle of CHG for all showers.  You should use approximately 1/3 of the bottle for each shower.   Please read over the following fact sheets that you were given: Pain Booklet, MRSA Information, Surgical Site Infection Prevention, Anesthesia Post-op Instructions and Care and Recovery After Surgery   Laparoscopic Cholecystectomy Care After These instructions give you information on caring for yourself after your procedure. Your doctor may also give you more specific instructions. Call your doctor if you have any problems or questions after your procedure. HOME CARE  Change your bandages (dressings) as told by your doctor.  Keep the wound dry and clean. Wash the wound gently with soap and water. Pat the wound dry with a clean towel.  Do not take baths, swim, or use hot tubs for 10 days, or  as told by your doctor.  Only take medicine as told by your doctor.  Eat a normal diet as told by your doctor.  Do not lift anything heavier than 25 pounds (11.5 kg), or as told by your doctor.  Do not play contact sports for 1 week, or as told by your doctor. GET HELP RIGHT AWAY IF:   Your wound is red, puffy (swollen), or painful.  You have yellowish-white fluid (pus) coming from the wound.  You have fluid draining from the wound for more than 1 day.  You have a bad smell coming from the wound.  Your wound breaks open.  You have a rash.  You have trouble breathing.  You have chest pain.  You have a bad reaction to your medicine.  You have a fever.  You have pain in the shoulders (shoulder strap areas).  You feel dizzy or pass out (faint).  You have severe belly (abdominal) pain.  You feel sick to your stomach (nauseous) or throw up (vomit) for more than 1 day. MAKE SURE YOU:  Understand these instructions.  Will watch your condition.  Will get help right away if you are not doing well or get worse. Document Released: 07/08/2008 Document Revised: 12/22/2011 Document Reviewed: 03/18/2011 [facility] Patient Information 2013 [place], [place]. Laparoscopic Cholecystectomy Laparoscopic cholecystectomy is surgery to remove the gallbladder. The gallbladder is located slightly to the right of center in the abdomen, behind the  liver. It is a concentrating and storage sac for the bile produced in the liver. Bile aids in the digestion and absorption of fats. Gallbladder disease (cholecystitis) is an inflammation of your gallbladder. This condition is usually caused by a buildup of gallstones (cholelithiasis) in your gallbladder. Gallstones can block the flow of bile, resulting in inflammation and pain. In severe cases, emergency surgery may be required. When emergency surgery is not required, you will have time to prepare for the procedure. Laparoscopic surgery is an alternative  to open surgery. Laparoscopic surgery usually has a shorter recovery time. Your common bile duct may also need to be examined and explored. Your caregiver will discuss this with you if he or she feels this should be done. If stones are found in the common bile duct, they may be removed. LET YOUR CAREGIVER KNOW ABOUT:  Allergies to food or medicine.  Medicines taken, including vitamins, herbs, eyedrops, over-the-counter medicines, and creams.  Use of steroids (by mouth or creams).  Previous problems with anesthetics or numbing medicines.  History of bleeding problems or blood clots.  Previous surgery.  Other health problems, including diabetes and kidney problems.  Possibility of pregnancy, if this applies. RISKS AND COMPLICATIONS All surgery is associated with risks. Some problems that may occur following this procedure include:  Infection.  Damage to the common bile duct, nerves, arteries, veins, or other internal organs such as the stomach or intestines.  Bleeding.  A stone may remain in the common bile duct. BEFORE THE PROCEDURE  Do not take aspirin for 3 days prior to surgery or blood thinners for 1 week prior to surgery.  Do not eat or drink anything after midnight the night before surgery.  Let your caregiver know if you develop a cold or other infectious problem prior to surgery.  You should be present 60 minutes before the procedure or as directed. PROCEDURE  You will be given medicine that makes you sleep (general anesthetic). When you are asleep, your surgeon will make several small cuts (incisions) in your abdomen. One of these incisions is used to insert a small, lighted scope (laparoscope) into the abdomen. The laparoscope helps the surgeon see into your abdomen. Carbon dioxide gas will be pumped into your abdomen. The gas allows more room for the surgeon to perform your surgery. Other operating instruments are inserted through the other incisions. Laparoscopic  procedures may not be appropriate when:  There is major scarring from previous surgery.  The gallbladder is extremely inflamed.  There are bleeding disorders or unexpected cirrhosis of the liver.  A pregnancy is near term.  Other conditions make the laparoscopic procedure impossible. If your surgeon feels it is not safe to continue with a laparoscopic procedure, he or she will perform an open abdominal procedure. In this case, the surgeon will make an incision to open the abdomen. This gives the surgeon a larger view and field to work within. This may allow the surgeon to perform procedures that sometimes cannot be performed with a laparoscope alone. Open surgery has a longer recovery time. AFTER THE PROCEDURE  You will be taken to the recovery area where a nurse will watch and check your progress.  You may be allowed to go home the same day.  Do not resume physical activities until directed by your caregiver.  You may resume a normal diet and activities as directed. Document Released: 09/29/2005 Document Revised: 12/22/2011 Document Reviewed: 03/14/2011 [facility] Patient Information 2013 [place], [place].

## 2012-11-12 ENCOUNTER — Encounter (HOSPITAL_COMMUNITY): Payer: Self-pay | Admitting: Anesthesiology

## 2012-11-12 ENCOUNTER — Encounter (HOSPITAL_COMMUNITY): Payer: Self-pay | Admitting: *Deleted

## 2012-11-12 ENCOUNTER — Ambulatory Visit (HOSPITAL_COMMUNITY): Payer: Managed Care, Other (non HMO) | Admitting: Anesthesiology

## 2012-11-12 ENCOUNTER — Encounter (HOSPITAL_COMMUNITY)
Admission: RE | Disposition: A | Discharge status: Home / Self Care | Payer: Self-pay | Source: Ambulatory Visit | Attending: General Surgery

## 2012-11-12 ENCOUNTER — Ambulatory Visit (HOSPITAL_COMMUNITY)
Admission: RE | Admit: 2012-11-12 | Discharge: 2012-11-12 | Disposition: A | Discharge status: Home / Self Care | Payer: Managed Care, Other (non HMO) | Source: Ambulatory Visit | Attending: General Surgery | Admitting: General Surgery

## 2012-11-12 DIAGNOSIS — K811 Chronic cholecystitis: Secondary | ICD-10-CM | POA: Insufficient documentation

## 2012-11-12 HISTORY — PX: CHOLECYSTECTOMY: SHX55

## 2012-11-12 SURGERY — LAPAROSCOPIC CHOLECYSTECTOMY
Anesthesia: General | Site: Abdomen | Wound class: Clean Contaminated

## 2012-11-12 MED ORDER — OXYCODONE-ACETAMINOPHEN 7.5-325 MG PO TABS: 1.0000 | ORAL_TABLET | ORAL | Status: DC | PRN

## 2012-11-12 MED ORDER — ROCURONIUM BROMIDE 50 MG/5ML IV SOLN
INTRAVENOUS | Status: AC
  Filled 2012-11-12: qty 1

## 2012-11-12 MED ORDER — FENTANYL CITRATE 0.05 MG/ML IJ SOLN
INTRAMUSCULAR | Status: AC
  Filled 2012-11-12: qty 5

## 2012-11-12 MED ORDER — DEXTROSE 5 % IV SOLN
2.0000 g | INTRAVENOUS | Status: AC
  Administered 2012-11-12: 2 g via INTRAVENOUS

## 2012-11-12 MED ORDER — HEMOSTATIC AGENTS (NO CHARGE) OPTIME
TOPICAL | Status: DC | PRN
  Administered 2012-11-12: 1 via TOPICAL

## 2012-11-12 MED ORDER — 0.9 % SODIUM CHLORIDE (POUR BTL) OPTIME
TOPICAL | Status: DC | PRN
  Administered 2012-11-12: 1000 mL

## 2012-11-12 MED ORDER — MIDAZOLAM HCL 2 MG/2ML IJ SOLN
1.0000 mg | INTRAMUSCULAR | Status: DC | PRN
  Administered 2012-11-12: 2 mg via INTRAVENOUS

## 2012-11-12 MED ORDER — GLYCOPYRROLATE 0.2 MG/ML IJ SOLN
INTRAMUSCULAR | Status: DC | PRN
  Administered 2012-11-12: 0.6 mg via INTRAVENOUS

## 2012-11-12 MED ORDER — FENTANYL CITRATE 0.05 MG/ML IJ SOLN
INTRAMUSCULAR | Status: AC
  Filled 2012-11-12: qty 2

## 2012-11-12 MED ORDER — DEXTROSE 5 % IV SOLN
INTRAVENOUS | Status: AC
  Filled 2012-11-12: qty 2

## 2012-11-12 MED ORDER — PROPOFOL 10 MG/ML IV BOLUS
INTRAVENOUS | Status: DC | PRN
  Administered 2012-11-12: 50 mg via INTRAVENOUS
  Administered 2012-11-12: 150 mg via INTRAVENOUS

## 2012-11-12 MED ORDER — DEXAMETHASONE SODIUM PHOSPHATE 4 MG/ML IJ SOLN
4.0000 mg | Freq: Once | INTRAMUSCULAR | Status: AC
  Administered 2012-11-12: 4 mg via INTRAVENOUS

## 2012-11-12 MED ORDER — ONDANSETRON HCL 4 MG/2ML IJ SOLN
INTRAMUSCULAR | Status: AC
  Filled 2012-11-12: qty 2

## 2012-11-12 MED ORDER — LIDOCAINE HCL (PF) 1 % IJ SOLN
INTRAMUSCULAR | Status: AC
  Filled 2012-11-12: qty 2

## 2012-11-12 MED ORDER — FENTANYL CITRATE 0.05 MG/ML IJ SOLN
INTRAMUSCULAR | Status: DC | PRN
  Administered 2012-11-12 (×7): 50 ug via INTRAVENOUS

## 2012-11-12 MED ORDER — DEXAMETHASONE SODIUM PHOSPHATE 4 MG/ML IJ SOLN
INTRAMUSCULAR | Status: AC
  Filled 2012-11-12: qty 1

## 2012-11-12 MED ORDER — KETOROLAC TROMETHAMINE 30 MG/ML IJ SOLN
30.0000 mg | Freq: Once | INTRAMUSCULAR | Status: AC
  Administered 2012-11-12: 30 mg via INTRAVENOUS

## 2012-11-12 MED ORDER — LIDOCAINE HCL (CARDIAC) 20 MG/ML IV SOLN
INTRAVENOUS | Status: DC | PRN
  Administered 2012-11-12: 40 mg via INTRAVENOUS

## 2012-11-12 MED ORDER — LIDOCAINE HCL (PF) 1 % IJ SOLN
INTRAMUSCULAR | Status: AC
  Filled 2012-11-12: qty 5

## 2012-11-12 MED ORDER — ROCURONIUM BROMIDE 100 MG/10ML IV SOLN
INTRAVENOUS | Status: DC | PRN
  Administered 2012-11-12: 30 mg via INTRAVENOUS

## 2012-11-12 MED ORDER — MIDAZOLAM HCL 2 MG/2ML IJ SOLN
INTRAMUSCULAR | Status: AC
  Filled 2012-11-12: qty 2

## 2012-11-12 MED ORDER — ENOXAPARIN SODIUM 40 MG/0.4ML ~~LOC~~ SOLN
40.0000 mg | Freq: Once | SUBCUTANEOUS | Status: AC
  Administered 2012-11-12: 40 mg via SUBCUTANEOUS

## 2012-11-12 MED ORDER — GLYCOPYRROLATE 0.2 MG/ML IJ SOLN
INTRAMUSCULAR | Status: AC
  Filled 2012-11-12: qty 3

## 2012-11-12 MED ORDER — FENTANYL CITRATE 0.05 MG/ML IJ SOLN
25.0000 ug | INTRAMUSCULAR | Status: DC | PRN
  Administered 2012-11-12 (×3): 50 ug via INTRAVENOUS

## 2012-11-12 MED ORDER — ONDANSETRON HCL 4 MG/2ML IJ SOLN
4.0000 mg | Freq: Once | INTRAMUSCULAR | Status: AC
  Administered 2012-11-12: 4 mg via INTRAVENOUS

## 2012-11-12 MED ORDER — BUPIVACAINE HCL (PF) 0.5 % IJ SOLN
INTRAMUSCULAR | Status: AC
  Filled 2012-11-12: qty 30

## 2012-11-12 MED ORDER — ONDANSETRON HCL 4 MG/2ML IJ SOLN: 4.0000 mg | Freq: Once | INTRAMUSCULAR | Status: DC | PRN

## 2012-11-12 MED ORDER — ENOXAPARIN SODIUM 40 MG/0.4ML ~~LOC~~ SOLN
SUBCUTANEOUS | Status: AC
  Filled 2012-11-12: qty 0.4

## 2012-11-12 MED ORDER — PROPOFOL 10 MG/ML IV EMUL
INTRAVENOUS | Status: AC
  Filled 2012-11-12: qty 20

## 2012-11-12 MED ORDER — BUPIVACAINE HCL (PF) 0.5 % IJ SOLN
INTRAMUSCULAR | Status: DC | PRN
  Administered 2012-11-12: 10 mL

## 2012-11-12 MED ORDER — KETOROLAC TROMETHAMINE 30 MG/ML IJ SOLN
INTRAMUSCULAR | Status: AC
  Filled 2012-11-12: qty 1

## 2012-11-12 MED ORDER — NEOSTIGMINE METHYLSULFATE 1 MG/ML IJ SOLN
INTRAMUSCULAR | Status: DC | PRN
  Administered 2012-11-12: 3 mg via INTRAVENOUS

## 2012-11-12 MED ORDER — LACTATED RINGERS IV SOLN
INTRAVENOUS | Status: DC
  Administered 2012-11-12: 1000 mL via INTRAVENOUS

## 2012-11-12 MED ORDER — LACTATED RINGERS IV SOLN: INTRAVENOUS | Status: DC

## 2012-11-12 SURGICAL SUPPLY — 34 items
APPLIER CLIP LAPSCP 10X32 DD (CLIP) ×2 IMPLANT
BAG HAMPER (MISCELLANEOUS) ×2 IMPLANT
BAG SPEC RTRVL LRG 6X4 10 (ENDOMECHANICALS) ×1
CLOTH BEACON ORANGE TIMEOUT ST (SAFETY) ×2 IMPLANT
COVER LIGHT HANDLE STERIS (MISCELLANEOUS) ×4 IMPLANT
DECANTER SPIKE VIAL GLASS SM (MISCELLANEOUS) ×2 IMPLANT
DURAPREP 26ML APPLICATOR (WOUND CARE) ×2 IMPLANT
ELECT REM PT RETURN 9FT ADLT (ELECTROSURGICAL) ×2
ELECTRODE REM PT RTRN 9FT ADLT (ELECTROSURGICAL) ×1 IMPLANT
FILTER SMOKE EVAC LAPAROSHD (FILTER) ×2 IMPLANT
FORMALIN 10 PREFIL 120ML (MISCELLANEOUS) ×2 IMPLANT
GLOVE BIO SURGEON STRL SZ7.5 (GLOVE) ×2 IMPLANT
GLOVE BIOGEL PI IND STRL 7.0 (GLOVE) IMPLANT
GLOVE BIOGEL PI INDICATOR 7.0 (GLOVE) ×4
GLOVE ECLIPSE 7.0 STRL STRAW (GLOVE) ×1 IMPLANT
GLOVE SS BIOGEL STRL SZ 6.5 (GLOVE) IMPLANT
GLOVE SUPERSENSE BIOGEL SZ 6.5 (GLOVE) ×3
GOWN STRL REIN XL XLG (GOWN DISPOSABLE) ×8 IMPLANT
HEMOSTAT SNOW SURGICEL 2X4 (HEMOSTASIS) ×2 IMPLANT
INST SET LAPROSCOPIC AP (KITS) ×2 IMPLANT
KIT ROOM TURNOVER APOR (KITS) ×2 IMPLANT
KIT TROCAR LAP CHOLE (TROCAR) ×2 IMPLANT
MANIFOLD NEPTUNE II (INSTRUMENTS) ×2 IMPLANT
NS IRRIG 1000ML POUR BTL (IV SOLUTION) ×2 IMPLANT
PACK LAP CHOLE LZT030E (CUSTOM PROCEDURE TRAY) ×2 IMPLANT
PAD ARMBOARD 7.5X6 YLW CONV (MISCELLANEOUS) ×2 IMPLANT
POUCH SPECIMEN RETRIEVAL 10MM (ENDOMECHANICALS) ×2 IMPLANT
SET BASIN LINEN APH (SET/KITS/TRAYS/PACK) ×2 IMPLANT
SPONGE GAUZE 2X2 8PLY STRL LF (GAUZE/BANDAGES/DRESSINGS) ×8 IMPLANT
STAPLER VISISTAT (STAPLE) ×2 IMPLANT
SUT VICRYL 0 UR6 27IN ABS (SUTURE) ×2 IMPLANT
TAPE CLOTH SURG 4X10 WHT LF (GAUZE/BANDAGES/DRESSINGS) ×1 IMPLANT
WARMER LAPAROSCOPE (MISCELLANEOUS) ×2 IMPLANT
YANKAUER SUCT 12FT TUBE ARGYLE (SUCTIONS) ×2 IMPLANT

## 2012-11-12 NOTE — Anesthesia Procedure Notes (Signed)
Procedure Name: Intubation Date/Time: 11/12/2012 7:35 AM Performed by: Glynn Octave E Pre-anesthesia Checklist: Patient identified, Patient being monitored, Timeout performed, Emergency Drugs available and Suction available Patient Re-evaluated:Patient Re-evaluated prior to inductionOxygen Delivery Method: Circle System Utilized Preoxygenation: Pre-oxygenation with 100% oxygen Intubation Type: IV induction, Rapid sequence and Cricoid Pressure applied Laryngoscope Size: Mac and 3 Grade View: Grade I Tube type: Oral Tube size: 7.0 mm Number of attempts: 1 Airway Equipment and Method: stylet Placement Confirmation: ETT inserted through vocal cords under direct vision and positive ETCO2 Secured at: 22 cm Tube secured with: Tape Dental Injury: Teeth and Oropharynx as per pre-operative assessment

## 2012-11-12 NOTE — Anesthesia Postprocedure Evaluation (Signed)
  Anesthesia Post-op Note  Patient: Jodi [name]  Procedure(s) Performed: Procedure(s) (LRB) with comments: LAPAROSCOPIC CHOLECYSTECTOMY (N/A)  Patient Location: PACU  Anesthesia Type:General  Level of Consciousness: awake, alert  and oriented  Airway and Oxygen Therapy: Patient Spontanous Breathing and Patient connected to face mask oxygen  Post-op Pain: mild  Post-op Assessment: Post-op Vital signs reviewed, Patient's Cardiovascular Status Stable, Respiratory Function Stable, Patent Airway and No signs of Nausea or vomiting  Post-op Vital Signs: Reviewed and stable  Complications: No apparent anesthesia complications

## 2012-11-12 NOTE — Anesthesia Preprocedure Evaluation (Signed)
Anesthesia Evaluation  Patient identified by MRN, date of birth, ID band Patient awake    Reviewed: Allergy & Precautions, H&P , NPO status , Patient's Chart, lab work & pertinent test results  Airway Mallampati: III TM Distance: >3 FB Neck ROM: Full    Dental  (+) Teeth Intact   Pulmonary shortness of breath, asthma (hx lung granuloma) , former smoker,  breath sounds clear to auscultation        Cardiovascular DVT Rhythm:Regular Rate:Normal     Neuro/Psych    GI/Hepatic GERD-  ,  Endo/Other    Renal/GU      Musculoskeletal   Abdominal   Peds  Hematology   Anesthesia Other Findings   Reproductive/Obstetrics                           Anesthesia Physical Anesthesia Plan  ASA: II  Anesthesia Plan: General   Post-op Pain Management:    Induction: Intravenous, Rapid sequence and Cricoid pressure planned  Airway Management Planned: Oral ETT  Additional Equipment:   Intra-op Plan:   Post-operative Plan: Extubation in OR  Informed Consent: I have reviewed the patients History and Physical, chart, labs and discussed the procedure including the risks, benefits and alternatives for the proposed anesthesia with the patient or authorized representative who has indicated his/her understanding and acceptance.     Plan Discussed with:   Anesthesia Plan Comments:         Anesthesia Quick Evaluation

## 2012-11-12 NOTE — Transfer of Care (Signed)
Immediate Anesthesia Transfer of Care Note  Patient: Jodi [name]  Procedure(s) Performed: Procedure(s) (LRB) with comments: LAPAROSCOPIC CHOLECYSTECTOMY (N/A)  Patient Location: PACU  Anesthesia Type:General  Level of Consciousness: awake, alert  and oriented  Airway & Oxygen Therapy: Patient Spontanous Breathing and Patient connected to face mask oxygen  Post-op Assessment: Report given to PACU RN and Post -op Vital signs reviewed and stable  Post vital signs: Reviewed and stable  Complications: No apparent anesthesia complications

## 2012-11-12 NOTE — Addendum Note (Signed)
Addendum  created 11/12/12 0826 by Tiyana Galla E Juwann Sherk, CRNA   Modules edited:Anesthesia Medication Administration    

## 2012-11-12 NOTE — Interval H&P Note (Signed)
History and Physical Interval Note:  11/12/2012 7:23 AM  Jodi [name]  has presented today for surgery, with the diagnosis of Chronic Cholelithiasis  The various methods of treatment have been discussed with the patient and family. After consideration of risks, benefits and other options for treatment, the patient has consented to  Procedure(s) (LRB) with comments: LAPAROSCOPIC CHOLECYSTECTOMY (N/A) as a surgical intervention .  The patient's history has been reviewed, patient examined, no change in status, stable for surgery.  I have reviewed the patient's chart and labs.  Questions were answered to the patient's satisfaction.     Franky Macho A

## 2012-11-12 NOTE — Op Note (Signed)
Patient:  Jodi [name]  DOB:  1976/07/16  MRN:  [id number]   Preop Diagnosis:  Chronic cholecystitis  Postop Diagnosis:  Same  Procedure:  Laparoscopic cholecystectomy  Surgeon:  Franky Macho, M.D.  Anes:  General endotracheal  Indications:  Patient is a 37 year old black female presents with biliary colic secondary to chronic cholecystitis. The risks and benefits of the procedure including bleeding, infection, hepatobiliary injury, and the possibility of an open procedure were fully explained to the patient, who gave informed consent.  Procedure note:  The patient was placed in the supine position. After induction of general endotracheal anesthesia, the abdomen was prepped and draped using the usual sterile technique with DuraPrep. Surgical site confirmation was performed.  An infraumbilical incision was made down to the fascia. A Veress needle was introduced into the abdominal cavity and confirmation of placement was done using the saline drop test. The abdomen was then insufflated to 16 mm mercury pressure. An 11 mm trocar was introduced into the abdominal cavity under direct visualization without difficulty. The patient was placed in reverse Trendelenburg position and additional 11 mm trocar was placed in the epigastric region and 5 mm trochars were placed in the right upper quadrant and right flank regions. The liver was inspected and noted to be within normal limits. The gallbladder was retracted in a dynamic fashion in order to expose the triangle of Calot. The cystic duct was first identified. Its juncture to the infundibulum was fully identified. Endoclips were placed proximally and distally on the cystic duct, and the cystic duct was divided. This is likewise done to the cystic artery. The gallbladder was then freed away from the gallbladder fossa using Bovie electrocautery. The gallbladder was delivered through the epigastric trocar site using an Endo Catch bag. The gallbladder  fossa was inspected and no abnormal bleeding or bile leakage was noted. Surgicel is placed the gallbladder fossa. All fluid and air were then evacuated from the abdominal cavity prior to removal of the trochars.  All wounds were irrigated with normal saline. All wounds were injected with 0.5% Sensorcaine. The infraumbilical fascia was reapproximated using an 0 Vicryl interrupted suture. All skin incisions were closed using staples. Betadine ointment and dry sterile dressings were applied.  All tape and needle counts were correct at the end of the procedure. The patient was extubated in the operating room and transferred to PACU in stable condition.  Complications:   none  EBL:  Minimal  Specimen:   gallbladder

## 2012-11-12 NOTE — Addendum Note (Signed)
Addendum  created 11/12/12 1191 by Moshe Salisbury, CRNA   Modules edited:Anesthesia Medication Administration

## 2012-11-21 ENCOUNTER — Other Ambulatory Visit: Payer: Self-pay | Admitting: Family Medicine

## 2012-12-12 ENCOUNTER — Other Ambulatory Visit: Payer: Self-pay | Admitting: Family Medicine

## 2012-12-16 ENCOUNTER — Emergency Department (HOSPITAL_COMMUNITY)
Admission: EM | Admit: 2012-12-16 | Discharge: 2012-12-16 | Discharge status: Against Medical Advice | Payer: Managed Care, Other (non HMO)

## 2012-12-18 ENCOUNTER — Emergency Department (HOSPITAL_COMMUNITY): Payer: Managed Care, Other (non HMO)

## 2012-12-18 ENCOUNTER — Encounter (HOSPITAL_COMMUNITY): Payer: Self-pay | Admitting: *Deleted

## 2012-12-18 ENCOUNTER — Emergency Department (HOSPITAL_COMMUNITY)
Admission: EM | Admit: 2012-12-18 | Discharge: 2012-12-18 | Disposition: A | Discharge status: Home / Self Care | Payer: Managed Care, Other (non HMO) | Attending: Emergency Medicine | Admitting: Emergency Medicine

## 2012-12-18 DIAGNOSIS — K219 Gastro-esophageal reflux disease without esophagitis: Secondary | ICD-10-CM | POA: Insufficient documentation

## 2012-12-18 DIAGNOSIS — Z8639 Personal history of other endocrine, nutritional and metabolic disease: Secondary | ICD-10-CM | POA: Insufficient documentation

## 2012-12-18 DIAGNOSIS — Y939 Activity, unspecified: Secondary | ICD-10-CM | POA: Insufficient documentation

## 2012-12-18 DIAGNOSIS — S93409A Sprain of unspecified ligament of unspecified ankle, initial encounter: Secondary | ICD-10-CM | POA: Insufficient documentation

## 2012-12-18 DIAGNOSIS — IMO0002 Reserved for concepts with insufficient information to code with codable children: Secondary | ICD-10-CM | POA: Insufficient documentation

## 2012-12-18 DIAGNOSIS — Z8719 Personal history of other diseases of the digestive system: Secondary | ICD-10-CM | POA: Insufficient documentation

## 2012-12-18 DIAGNOSIS — Z79899 Other long term (current) drug therapy: Secondary | ICD-10-CM | POA: Insufficient documentation

## 2012-12-18 DIAGNOSIS — Z8709 Personal history of other diseases of the respiratory system: Secondary | ICD-10-CM | POA: Insufficient documentation

## 2012-12-18 DIAGNOSIS — Z86718 Personal history of other venous thrombosis and embolism: Secondary | ICD-10-CM | POA: Insufficient documentation

## 2012-12-18 DIAGNOSIS — Z862 Personal history of diseases of the blood and blood-forming organs and certain disorders involving the immune mechanism: Secondary | ICD-10-CM | POA: Insufficient documentation

## 2012-12-18 DIAGNOSIS — J45901 Unspecified asthma with (acute) exacerbation: Secondary | ICD-10-CM | POA: Insufficient documentation

## 2012-12-18 DIAGNOSIS — Z87891 Personal history of nicotine dependence: Secondary | ICD-10-CM | POA: Insufficient documentation

## 2012-12-18 DIAGNOSIS — X500XXA Overexertion from strenuous movement or load, initial encounter: Secondary | ICD-10-CM | POA: Insufficient documentation

## 2012-12-18 DIAGNOSIS — Y929 Unspecified place or not applicable: Secondary | ICD-10-CM | POA: Insufficient documentation

## 2012-12-18 MED ORDER — OXYCODONE-ACETAMINOPHEN 5-325 MG PO TABS
1.0000 | ORAL_TABLET | Freq: Once | ORAL | Status: AC
  Administered 2012-12-18: 1 via ORAL
  Filled 2012-12-18: qty 1

## 2012-12-18 MED ORDER — OXYCODONE-ACETAMINOPHEN 5-325 MG PO TABS: 1.0000 | ORAL_TABLET | ORAL | Status: DC | PRN

## 2012-12-18 NOTE — ED Notes (Signed)
Pt was carrying a bucket of water, tripped twisting her right ankle Thursday evening, pt c/o pain and swelling to right ankle. Cms intact distal

## 2012-12-21 NOTE — ED Provider Notes (Signed)
History     CSN: [id number]  Arrival date & time 12/18/12  1740   First MD Initiated Contact with Patient 12/18/12 1919      Chief Complaint  Patient presents with  . Ankle Pain    (Consider location/radiation/quality/duration/timing/severity/associated sxs/prior treatment) HPI Comments: Jodi [name] is a 37 y.o. Female with persistent pain and swelling in her right ankle since tripping yesterday and twisting the ankle.  She has aching pain without radiation at her right lateral ankle along with edema.  She has used ice and elevation without relief of her pain.  She denies numbness distal to the injury site.     The history is provided by the patient.    Past Medical History  Diagnosis Date  . Asthma   . DVT (deep venous thrombosis)   . Asthma   . Allergic rhinitis   . 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 Hyperlipidemia   . Shortness of breath     SOB even without exertion at times  . GERD (gastroesophageal reflux disease)   . IBS (irritable bowel syndrome)     Past Surgical History  Procedure Laterality Date  . Tubal ligation  1999  . Blood clot removed from lower abdomen  2000  . Cyst removed from ovary and fluid removed from tubes  2000  . Partial hysterectomy  2009  . Abdominal hysterectomy    . Esophagogastroduodenoscopy  10/22/2012    Procedure: ESOPHAGOGASTRODUODENOSCOPY (EGD);  Surgeon: Malissa Hippo, MD;  Location: [facility];  Service: Endoscopy;  Laterality: N/A;  100  . Colonoscopy  11/04/2012    Procedure: COLONOSCOPY;  Surgeon: Malissa Hippo, MD;  Location: [facility];  Service: Endoscopy;  Laterality: N/A;  915  . Cholecystectomy  11/12/2012    Procedure: LAPAROSCOPIC CHOLECYSTECTOMY;  Surgeon: Dalia Heading, MD;  Location: [facility];  Service: General;  Laterality: N/A;    Family History  Problem Relation Age of Onset  . Asthma Mother   . Hypertension Mother   . Bronchiolitis Mother   . Hypertension Father   .  Hyperlipidemia Father     recurrent rectum polyps   . Diabetes Sister   . Lupus Sister   . Depression Sister     History  Substance Use Topics  . Smoking status: Former Smoker -- 0.25 packs/day for 10 years    Types: Cigarettes  . Smokeless tobacco: Not on file  . Alcohol Use: No    OB History   Grav Para Term Preterm Abortions TAB SAB Ect Mult Living                  Review of Systems  Musculoskeletal: Positive for joint swelling and arthralgias.  Skin: Negative for wound.  Neurological: Negative for weakness and numbness.    Allergies  Compazine and Compazine  Home Medications   Current Outpatient Rx  Name  Route  Sig  Dispense  Refill  . HYDROcodone-acetaminophen (NORCO/VICODIN) 5-325 MG per tablet   Oral   Take 1 tablet by mouth every 6 (six) hours as needed for pain.         Marland Kitchen albuterol (PROVENTIL) (2.5 MG/3ML) 0.083% nebulizer solution   Nebulization   Take 3 mLs (2.5 mg total) by nebulization every 4 (four) hours. Use every 4 hours for the next 2 days; then every 4 hours as needed for wheezing, chest tightness, or shortness of breath.   75 mL   3   .  albuterol (VENTOLIN HFA) 108 (90 BASE) MCG/ACT inhaler   Inhalation   Inhale 2 puffs into the lungs every 4 (four) hours as needed.   1 Inhaler   3   . dicyclomine (BENTYL) 10 MG capsule   Oral   Take 1 capsule (10 mg total) by mouth 3 (three) times daily before meals.   90 capsule   2   . EXPIRED: fluticasone (FLONASE) 50 MCG/ACT nasal spray   Nasal   Place 2 sprays into the nose daily.   16 g   4   . Fluticasone-Salmeterol (ADVAIR DISKUS) 250-50 MCG/DOSE AEPB      Two puffs twice daily   120 each   5   . ibuprofen (ADVIL,MOTRIN) 400 MG tablet   Oral   Take 1 tablet (400 mg total) by mouth every 8 (eight) hours as needed for pain.   60 tablet   0   . levocetirizine (XYZAL) 5 MG tablet   Oral   Take 1 tablet (5 mg total) by mouth every evening.   30 tablet   4   . levocetirizine  (XYZAL) 5 MG tablet      TAKE ONE TABLET BY MOUTH EVERY DAY IN THE EVENING   30 tablet   3   . montelukast (SINGULAIR) 10 MG tablet   Oral   Take 10 mg by mouth every morning.         . montelukast (SINGULAIR) 10 MG tablet      TAKE ONE TABLET BY MOUTH EVERY DAY AT BEDTIME   30 tablet   3   . omeprazole (PRILOSEC) 20 MG capsule   Oral   Take 1 capsule (20 mg total) by mouth 2 (two) times daily.   60 capsule   3   . oxyCODONE-acetaminophen (PERCOCET) 7.5-325 MG per tablet   Oral   Take 1-2 tablets by mouth every 4 (four) hours as needed for pain.   40 tablet   0   . oxyCODONE-acetaminophen (PERCOCET/ROXICET) 5-325 MG per tablet   Oral   Take 1-2 tablets by mouth every 4 (four) hours as needed for pain.   20 tablet   0     BP 145/76  Pulse 100  Temp(Src) 98.4 F (36.9 C) (Oral)  Resp 20  Ht 5\' 6"  (1.676 m)  Wt 165 lb (74.844 kg)  BMI 26.64 kg/m2  SpO2 100%  LMP 05/17/2012  Physical Exam  Nursing note and vitals reviewed. Constitutional: She appears well-developed and well-nourished.  HENT:  Head: Normocephalic.  Cardiovascular: Normal rate and intact distal pulses.  Exam reveals no decreased pulses.   Pulses:      Dorsalis pedis pulses are 2+ on the right side, and 2+ on the left side.       Posterior tibial pulses are 2+ on the right side, and 2+ on the left side.  Musculoskeletal: She exhibits edema and tenderness.       Right ankle: She exhibits decreased range of motion and swelling. She exhibits no ecchymosis and normal pulse. Tenderness. Lateral malleolus tenderness found. No head of 5th metatarsal and no proximal fibula tenderness found. Achilles tendon normal.  Neurological: She is alert. No sensory deficit.  Skin: Skin is warm, dry and intact.    ED Course  Procedures (including critical care time)  Labs Reviewed - No data to display No results found.   1. Ankle sprain and strain, right, initial encounter       MDM  Pt prescribed  oxycodone,  Fitted with aso,  Examined post application with improved pain,  Cap refill less than 3 sec.  She has crutches,  Encouraged to keep using.  RICE.  Recheck in one week by Dr. Hilda Lias if not improving.        Burgess Amor, PA-C 12/21/12 1355

## 2012-12-22 NOTE — ED Provider Notes (Signed)
Medical screening examination/treatment/procedure(s) were performed by non-physician practitioner and as supervising physician I was immediately available for consultation/collaboration.   Shelda Jakes, MD 12/22/12 1946

## 2013-01-06 ENCOUNTER — Ambulatory Visit (INDEPENDENT_AMBULATORY_CARE_PROVIDER_SITE_OTHER): Payer: Managed Care, Other (non HMO) | Admitting: Family Medicine

## 2013-01-06 ENCOUNTER — Encounter: Payer: Self-pay | Admitting: Family Medicine

## 2013-01-06 VITALS — BP 150/82 | HR 94 | Resp 16 | Ht 66.0 in | Wt 171.1 lb

## 2013-01-06 DIAGNOSIS — M25569 Pain in unspecified knee: Secondary | ICD-10-CM

## 2013-01-06 DIAGNOSIS — M25562 Pain in left knee: Secondary | ICD-10-CM | POA: Insufficient documentation

## 2013-01-06 DIAGNOSIS — E785 Hyperlipidemia, unspecified: Secondary | ICD-10-CM

## 2013-01-06 DIAGNOSIS — K219 Gastro-esophageal reflux disease without esophagitis: Secondary | ICD-10-CM

## 2013-01-06 DIAGNOSIS — J45909 Unspecified asthma, uncomplicated: Secondary | ICD-10-CM

## 2013-01-06 DIAGNOSIS — E663 Overweight: Secondary | ICD-10-CM

## 2013-01-06 DIAGNOSIS — R030 Elevated blood-pressure reading, without diagnosis of hypertension: Secondary | ICD-10-CM

## 2013-01-06 DIAGNOSIS — IMO0001 Reserved for inherently not codable concepts without codable children: Secondary | ICD-10-CM

## 2013-01-06 DIAGNOSIS — J309 Allergic rhinitis, unspecified: Secondary | ICD-10-CM

## 2013-01-06 DIAGNOSIS — Z13220 Encounter for screening for lipoid disorders: Secondary | ICD-10-CM

## 2013-01-06 MED ORDER — FLUTICASONE PROPIONATE 50 MCG/ACT NA SUSP: 2.0000 | Freq: Every day | NASAL | Status: DC

## 2013-01-06 NOTE — Patient Instructions (Addendum)
F/u in 5  month, call if you need me before.  I am thankful that the abdominal  pain is better with surgery.  Sorry about the fractured right ankle, please keep off the leg and follow instructions per orthopedics  Fasting lipid in 5 month

## 2013-01-06 NOTE — Assessment & Plan Note (Signed)
Controlled, no change in medication  

## 2013-01-06 NOTE — Assessment & Plan Note (Signed)
Deteriorated. Patient re-educated about  the importance of commitment to a  minimum of 150 minutes of exercise per week. The importance of healthy food choices with portion control discussed. Encouraged to start a food diary, count calories and to consider  joining a support group. Sample diet sheets offered. Goals set by the patient for the next several months.    

## 2013-01-09 DIAGNOSIS — IMO0001 Reserved for inherently not codable concepts without codable children: Secondary | ICD-10-CM | POA: Insufficient documentation

## 2013-01-09 NOTE — Assessment & Plan Note (Signed)
Controlled, no change in medication  

## 2013-01-09 NOTE — Assessment & Plan Note (Signed)
pai increased wih riht ankle fracture, using anti inflammatories for relief

## 2013-01-09 NOTE — Assessment & Plan Note (Signed)
Hyperlipidemia:Low fat diet discussed and encouraged.  No meds at this time 
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
## 2013-01-09 NOTE — Assessment & Plan Note (Signed)
Advised pt of elevated systolic blood pressure, and the need to increae fresh and frozen fruit, reduce sodium and lose weight. Physcial activity to  Be increased as tolerated No meds at this time

## 2013-03-12 ENCOUNTER — Encounter (HOSPITAL_COMMUNITY): Payer: Self-pay | Admitting: Emergency Medicine

## 2013-03-12 ENCOUNTER — Emergency Department (HOSPITAL_COMMUNITY)
Admission: EM | Admit: 2013-03-12 | Discharge: 2013-03-12 | Disposition: A | Discharge status: Home / Self Care | Payer: Managed Care, Other (non HMO) | Attending: Emergency Medicine | Admitting: Emergency Medicine

## 2013-03-12 ENCOUNTER — Emergency Department (HOSPITAL_COMMUNITY): Payer: Managed Care, Other (non HMO)

## 2013-03-12 DIAGNOSIS — Z86718 Personal history of other venous thrombosis and embolism: Secondary | ICD-10-CM | POA: Insufficient documentation

## 2013-03-12 DIAGNOSIS — J45909 Unspecified asthma, uncomplicated: Secondary | ICD-10-CM | POA: Insufficient documentation

## 2013-03-12 DIAGNOSIS — Z862 Personal history of diseases of the blood and blood-forming organs and certain disorders involving the immune mechanism: Secondary | ICD-10-CM | POA: Insufficient documentation

## 2013-03-12 DIAGNOSIS — Z87891 Personal history of nicotine dependence: Secondary | ICD-10-CM | POA: Insufficient documentation

## 2013-03-12 DIAGNOSIS — Z9851 Tubal ligation status: Secondary | ICD-10-CM | POA: Insufficient documentation

## 2013-03-12 DIAGNOSIS — K219 Gastro-esophageal reflux disease without esophagitis: Secondary | ICD-10-CM | POA: Insufficient documentation

## 2013-03-12 DIAGNOSIS — K589 Irritable bowel syndrome without diarrhea: Secondary | ICD-10-CM | POA: Insufficient documentation

## 2013-03-12 DIAGNOSIS — R1011 Right upper quadrant pain: Secondary | ICD-10-CM | POA: Insufficient documentation

## 2013-03-12 DIAGNOSIS — R109 Unspecified abdominal pain: Secondary | ICD-10-CM

## 2013-03-12 DIAGNOSIS — Z79899 Other long term (current) drug therapy: Secondary | ICD-10-CM | POA: Insufficient documentation

## 2013-03-12 DIAGNOSIS — Z791 Long term (current) use of non-steroidal anti-inflammatories (NSAID): Secondary | ICD-10-CM | POA: Insufficient documentation

## 2013-03-12 DIAGNOSIS — Z90710 Acquired absence of both cervix and uterus: Secondary | ICD-10-CM | POA: Insufficient documentation

## 2013-03-12 DIAGNOSIS — Z8709 Personal history of other diseases of the respiratory system: Secondary | ICD-10-CM | POA: Insufficient documentation

## 2013-03-12 DIAGNOSIS — Z8639 Personal history of other endocrine, nutritional and metabolic disease: Secondary | ICD-10-CM | POA: Insufficient documentation

## 2013-03-12 DIAGNOSIS — Z9889 Other specified postprocedural states: Secondary | ICD-10-CM | POA: Insufficient documentation

## 2013-03-12 LAB — URINALYSIS, ROUTINE W REFLEX MICROSCOPIC
Bilirubin Urine: NEGATIVE
Glucose, UA: NEGATIVE mg/dL
Ketones, ur: NEGATIVE mg/dL
Leukocytes, UA: NEGATIVE
Nitrite: NEGATIVE
Specific Gravity, Urine: 1.014 (ref 1.005–1.030)
pH: 6 (ref 5.0–8.0)

## 2013-03-12 LAB — COMPREHENSIVE METABOLIC PANEL
ALT: 16 U/L (ref 0–35)
AST: 19 U/L (ref 0–37)
CO2: 26 mEq/L (ref 19–32)
Calcium: 9.4 mg/dL (ref 8.4–10.5)
Creatinine, Ser: 0.82 mg/dL (ref 0.50–1.10)
GFR calc Af Amer: >90 mL/min (ref >90)
GFR calc non Af Amer: 90 mL/min — ABNORMAL LOW (ref >90)
Glucose, Bld: 78 mg/dL (ref 70–99)
Sodium: 137 mEq/L (ref 135–145)
Total Protein: 6.8 g/dL (ref 6.0–8.3)

## 2013-03-12 LAB — CBC WITH DIFFERENTIAL/PLATELET
HCT: 32.4 % — ABNORMAL LOW (ref 36.0–46.0)
Hemoglobin: 11.4 g/dL — ABNORMAL LOW (ref 12.0–15.0)
Lymphs Abs: 2.2 10*3/uL (ref 0.7–4.0)
Monocytes Absolute: 0.5 10*3/uL (ref 0.1–1.0)
Monocytes Relative: 7 % (ref 3–12)
Neutro Abs: 3.7 10*3/uL (ref 1.7–7.7)
Neutrophils Relative %: 56 % (ref 43–77)
RBC: 3.8 MIL/uL — ABNORMAL LOW (ref 3.87–5.11)

## 2013-03-12 MED ORDER — SODIUM CHLORIDE 0.9 % IV BOLUS (SEPSIS)
1000.0000 mL | Freq: Once | INTRAVENOUS | Status: AC
  Administered 2013-03-12: 1000 mL via INTRAVENOUS

## 2013-03-12 MED ORDER — OXYCODONE-ACETAMINOPHEN 5-325 MG PO TABS: 2.0000 | ORAL_TABLET | ORAL | Status: DC | PRN

## 2013-03-12 MED ORDER — MORPHINE SULFATE 4 MG/ML IJ SOLN
4.0000 mg | Freq: Once | INTRAMUSCULAR | Status: AC
  Administered 2013-03-12: 4 mg via INTRAVENOUS
  Filled 2013-03-12: qty 1

## 2013-03-12 NOTE — ED Notes (Signed)
Pt c/o right sided abd pain that feels like pain prior to having gall bladder out starting today

## 2013-03-12 NOTE — ED Provider Notes (Signed)
History     CSN: [id number]  Arrival date & time 03/12/13  1818   First MD Initiated Contact with Patient 03/12/13 1927      Chief Complaint  Patient presents with  . Abdominal Pain    (Consider location/radiation/quality/duration/timing/severity/associated sxs/prior treatment) The history is provided by the patient.  Jodi [name] is a 37 y.o. female history of cholecystectomy January this year, here with RUQ pain. Right upper quadrant pain that is acute in onset today. It was sharp and radiates to the flank. She said it was similar to her gallbladder pain prior to her surgery. Denies any fevers or chills or vomiting. Denies any constipation or diarrhea or urinary symptoms. She does not have a history of kidney stones in the past.   Past Medical History  Diagnosis Date  . Asthma   . DVT (deep venous thrombosis)   . Asthma   . Allergic rhinitis   . 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 Hyperlipidemia   . Shortness of breath     SOB even without exertion at times  . GERD (gastroesophageal reflux disease)   . IBS (irritable bowel syndrome)     Past Surgical History  Procedure Laterality Date  . Tubal ligation  1999  . Blood clot removed from lower abdomen  2000  . Cyst removed from ovary and fluid removed from tubes  2000  . Partial hysterectomy  2009  . Abdominal hysterectomy    . Esophagogastroduodenoscopy  10/22/2012    Procedure: ESOPHAGOGASTRODUODENOSCOPY (EGD);  Surgeon: Malissa Hippo, MD;  Location: [facility];  Service: Endoscopy;  Laterality: N/A;  100  . Colonoscopy  11/04/2012    Procedure: COLONOSCOPY;  Surgeon: Malissa Hippo, MD;  Location: [facility];  Service: Endoscopy;  Laterality: N/A;  915  . Cholecystectomy  11/12/2012    Procedure: LAPAROSCOPIC CHOLECYSTECTOMY;  Surgeon: Dalia Heading, MD;  Location: [facility];  Service: General;  Laterality: N/A;    Family History  Problem Relation Age of Onset  . Asthma Mother   . Hypertension  Mother   . Bronchiolitis Mother   . Hypertension Father   . Hyperlipidemia Father     recurrent rectum polyps   . Diabetes Sister   . Lupus Sister   . Depression Sister     History  Substance Use Topics  . Smoking status: Former Smoker -- 0.25 packs/day for 10 years    Types: Cigarettes  . Smokeless tobacco: Not on file  . Alcohol Use: No    OB History   Grav Para Term Preterm Abortions TAB SAB Ect Mult Living                  Review of Systems  Gastrointestinal: Positive for abdominal pain.  All other systems reviewed and are negative.    Allergies  Compazine and Compazine  Home Medications   Current Outpatient Rx  Name  Route  Sig  Dispense  Refill  . albuterol (PROVENTIL) (2.5 MG/3ML) 0.083% nebulizer solution   Nebulization   Take 3 mLs (2.5 mg total) by nebulization every 4 (four) hours. Use every 4 hours for the next 2 days; then every 4 hours as needed for wheezing, chest tightness, or shortness of breath.   75 mL   3   . albuterol (VENTOLIN HFA) 108 (90 BASE) MCG/ACT inhaler   Inhalation   Inhale 2 puffs into the lungs every 4 (four) hours as needed.  1 Inhaler   3   . dicyclomine (BENTYL) 10 MG capsule   Oral   Take 1 capsule (10 mg total) by mouth 3 (three) times daily before meals.   90 capsule   2   . fluticasone (FLONASE) 50 MCG/ACT nasal spray   Nasal   Place 2 sprays into the nose daily.   16 g   4   . fluticasone-salmeterol (ADVAIR HFA) 230-21 MCG/ACT inhaler   Inhalation   Inhale 2 puffs into the lungs 2 (two) times daily.   1 Inhaler   12   . HYDROcodone-acetaminophen (NORCO) 7.5-325 MG per tablet   Oral   Take 1 tablet by mouth every 6 (six) hours as needed for pain.         Marland Kitchen levocetirizine (XYZAL) 5 MG tablet   Oral   Take 1 tablet (5 mg total) by mouth every evening.   30 tablet   4   . montelukast (SINGULAIR) 10 MG tablet   Oral   Take 10 mg by mouth every morning.         . naproxen (NAPROSYN) 375 MG  tablet   Oral   Take 1 tablet (375 mg total) by mouth 2 (two) times daily with a meal.   60 tablet   2   . omeprazole (PRILOSEC) 20 MG capsule   Oral   Take 1 capsule (20 mg total) by mouth 2 (two) times daily.   60 capsule   3     BP 140/79  Pulse 79  Temp(Src) 98.4 F (36.9 C) (Oral)  Resp 16  SpO2 100%  LMP 05/17/2012  Physical Exam  Nursing note and vitals reviewed. Constitutional: She is oriented to person, place, and time. She appears well-developed.  Uncomfortable, holding R side of her abdomen   HENT:  Head: Normocephalic.  Mouth/Throat: Oropharynx is clear and moist.  Eyes: Conjunctivae are normal. Pupils are equal, round, and reactive to light.  Neck: Normal range of motion. Neck supple.  Cardiovascular: Normal rate, regular rhythm and normal heart sounds.   Pulmonary/Chest: Effort normal and breath sounds normal. No respiratory distress. She has no wheezes. She has no rales.  Abdominal: Soft.  + RUQ tenderness, no rebound + murphy's sign. + R CVAT   Musculoskeletal: Normal range of motion. She exhibits no edema and no tenderness.  Neurological: She is alert and oriented to person, place, and time.  Skin: Skin is warm and dry.  Psychiatric: She has a normal mood and affect. Her behavior is normal. Judgment and thought content normal.    ED Course  Procedures (including critical care time)  Labs Reviewed  COMPREHENSIVE METABOLIC PANEL - Abnormal; Notable for the following:    Total Bilirubin 0.2 (*)    GFR calc non Af Amer 90 (*)    All other components within normal limits  CBC WITH DIFFERENTIAL - Abnormal; Notable for the following:    RBC 3.80 (*)    Hemoglobin 11.4 (*)    HCT 32.4 (*)    All other components within normal limits  LIPASE, BLOOD  URINALYSIS, ROUTINE W REFLEX MICROSCOPIC  POCT PREGNANCY, URINE   US Abdomen Complete  03/12/2013   *RADIOLOGY REPORT*  Clinical Data:  Right upper quadrant pain.  Prior cholecystectomy.  ABDOMINAL  ULTRASOUND COMPLETE  Comparison:  None.  Findings:  Gallbladder:  Surgically absent.  Common Bile Duct:  Within normal limits in caliber. Measures 4 mm in diameter.  Liver: No focal mass lesion identified.  Within normal limits in parenchymal echogenicity.  IVC:  Appears normal.  Pancreas:  No abnormality identified.  Spleen:  Within normal limits in size and echotexture.  Right kidney:  Normal in size and parenchymal echogenicity.  No evidence of mass or hydronephrosis.  Left kidney:  Normal in size and parenchymal echogenicity.  No evidence of mass or hydronephrosis.  Abdominal Aorta:  No aneurysm identified.  IMPRESSION:  Prior cholecystectomy.  No evidence of biliary dilatation or other acute findings.   Original Report Authenticated By: Myles Rosenthal, M.D.   Dg Abd Acute W/chest  03/12/2013   *RADIOLOGY REPORT*  Clinical Data: Abdominal pain.  ACUTE ABDOMEN SERIES (ABDOMEN 2 VIEW & CHEST 1 VIEW)  Comparison: CT 10/19/2012  Findings: Prior cholecystectomy. The bowel gas pattern is normal. There is no evidence of free intraperitoneal air.  No suspicious radio-opaque calculi or other significant radiographic abnormality is seen. Heart size and mediastinal contours are within normal limits.  Both lungs are clear.  IMPRESSION: No acute findings.   Original Report Authenticated By: Charlett Nose, M.D.     No diagnosis found.    MDM  DELOMA SPINDLE is a 37 y.o. female here with RUQ pain. Need to r/o retained stone vs kidney stone vs pancreatitis. Will do RUQ and R renal US and get labs and give pain meds and reassess.   10:49 PM Labs unremarkable. LFTs unremarkable. Lipase nl. US showed no dilated CBD or hydro. Her pain is controlled with morphine. I don't really know the etiology of her pain. I think it can be early gastro vs post surgical scarring. Will give percocet and have her f/u with surgery.         Richardean Canal, MD 03/12/13 2251

## 2013-03-12 NOTE — ED Notes (Signed)
Patient reports an acute onset of RLQ abdominal pain today. Associated with nausea. Tolerating food and drink without difficulty. Denies vomiting, diarrhea or constipation. LBM today and nml for her. Hx partial hysterectomy and gallbladder removal (jan 2014). No fevers, sweats or chills.

## 2013-03-12 NOTE — ED Notes (Signed)
Patient transported to US/XR via stretcher

## 2013-04-04 ENCOUNTER — Emergency Department (HOSPITAL_COMMUNITY)
Admission: EM | Admit: 2013-04-04 | Discharge: 2013-04-04 | Disposition: A | Discharge status: Home / Self Care | Payer: Managed Care, Other (non HMO) | Attending: Emergency Medicine | Admitting: Emergency Medicine

## 2013-04-04 ENCOUNTER — Encounter (HOSPITAL_COMMUNITY): Payer: Self-pay | Admitting: Emergency Medicine

## 2013-04-04 DIAGNOSIS — Z79899 Other long term (current) drug therapy: Secondary | ICD-10-CM | POA: Insufficient documentation

## 2013-04-04 DIAGNOSIS — Z862 Personal history of diseases of the blood and blood-forming organs and certain disorders involving the immune mechanism: Secondary | ICD-10-CM | POA: Insufficient documentation

## 2013-04-04 DIAGNOSIS — Z8679 Personal history of other diseases of the circulatory system: Secondary | ICD-10-CM | POA: Insufficient documentation

## 2013-04-04 DIAGNOSIS — Z90710 Acquired absence of both cervix and uterus: Secondary | ICD-10-CM | POA: Insufficient documentation

## 2013-04-04 DIAGNOSIS — R197 Diarrhea, unspecified: Secondary | ICD-10-CM | POA: Insufficient documentation

## 2013-04-04 DIAGNOSIS — J45909 Unspecified asthma, uncomplicated: Secondary | ICD-10-CM | POA: Insufficient documentation

## 2013-04-04 DIAGNOSIS — Z8639 Personal history of other endocrine, nutritional and metabolic disease: Secondary | ICD-10-CM | POA: Insufficient documentation

## 2013-04-04 DIAGNOSIS — Z8709 Personal history of other diseases of the respiratory system: Secondary | ICD-10-CM | POA: Insufficient documentation

## 2013-04-04 DIAGNOSIS — Z9089 Acquired absence of other organs: Secondary | ICD-10-CM | POA: Insufficient documentation

## 2013-04-04 DIAGNOSIS — Z9851 Tubal ligation status: Secondary | ICD-10-CM | POA: Insufficient documentation

## 2013-04-04 DIAGNOSIS — Z8719 Personal history of other diseases of the digestive system: Secondary | ICD-10-CM | POA: Insufficient documentation

## 2013-04-04 DIAGNOSIS — M25519 Pain in unspecified shoulder: Secondary | ICD-10-CM | POA: Insufficient documentation

## 2013-04-04 DIAGNOSIS — R1011 Right upper quadrant pain: Secondary | ICD-10-CM | POA: Insufficient documentation

## 2013-04-04 DIAGNOSIS — K829 Disease of gallbladder, unspecified: Secondary | ICD-10-CM | POA: Insufficient documentation

## 2013-04-04 DIAGNOSIS — K219 Gastro-esophageal reflux disease without esophagitis: Secondary | ICD-10-CM | POA: Insufficient documentation

## 2013-04-04 DIAGNOSIS — Z3202 Encounter for pregnancy test, result negative: Secondary | ICD-10-CM | POA: Insufficient documentation

## 2013-04-04 DIAGNOSIS — R110 Nausea: Secondary | ICD-10-CM | POA: Insufficient documentation

## 2013-04-04 DIAGNOSIS — IMO0002 Reserved for concepts with insufficient information to code with codable children: Secondary | ICD-10-CM | POA: Insufficient documentation

## 2013-04-04 DIAGNOSIS — Z87891 Personal history of nicotine dependence: Secondary | ICD-10-CM | POA: Insufficient documentation

## 2013-04-04 DIAGNOSIS — Z791 Long term (current) use of non-steroidal anti-inflammatories (NSAID): Secondary | ICD-10-CM | POA: Insufficient documentation

## 2013-04-04 DIAGNOSIS — Z9889 Other specified postprocedural states: Secondary | ICD-10-CM | POA: Insufficient documentation

## 2013-04-04 LAB — COMPREHENSIVE METABOLIC PANEL
ALT: 17 U/L (ref 0–35)
Alkaline Phosphatase: 97 U/L (ref 39–117)
BUN: 8 mg/dL (ref 6–23)
CO2: 25 mEq/L (ref 19–32)
Chloride: 98 mEq/L (ref 96–112)
GFR calc Af Amer: >90 mL/min (ref >90)
GFR calc non Af Amer: 83 mL/min — ABNORMAL LOW (ref >90)
Glucose, Bld: 93 mg/dL (ref 70–99)
Potassium: 3.4 mEq/L — ABNORMAL LOW (ref 3.5–5.1)
Sodium: 137 mEq/L (ref 135–145)
Total Bilirubin: 0.4 mg/dL (ref 0.3–1.2)
Total Protein: 7.1 g/dL (ref 6.0–8.3)

## 2013-04-04 LAB — CBC WITH DIFFERENTIAL/PLATELET
Eosinophils Absolute: 0.2 10*3/uL (ref 0.0–0.7)
Hemoglobin: 12.2 g/dL (ref 12.0–15.0)
Lymphocytes Relative: 42 % (ref 12–46)
Lymphs Abs: 2.5 10*3/uL (ref 0.7–4.0)
MCH: 30.3 pg (ref 26.0–34.0)
Monocytes Relative: 8 % (ref 3–12)
Neutrophils Relative %: 47 % (ref 43–77)
Platelets: 336 10*3/uL (ref 150–400)
RBC: 4.02 MIL/uL (ref 3.87–5.11)
WBC: 5.9 10*3/uL (ref 4.0–10.5)

## 2013-04-04 LAB — URINALYSIS, ROUTINE W REFLEX MICROSCOPIC
Bilirubin Urine: NEGATIVE
Hgb urine dipstick: NEGATIVE
Ketones, ur: NEGATIVE mg/dL
Nitrite: NEGATIVE
Protein, ur: NEGATIVE mg/dL
Specific Gravity, Urine: <1.005 — ABNORMAL LOW (ref 1.005–1.030)
Urobilinogen, UA: 0.2 mg/dL (ref 0.0–1.0)

## 2013-04-04 LAB — LIPASE, BLOOD: Lipase: 13 U/L (ref 11–59)

## 2013-04-04 MED ORDER — ONDANSETRON HCL 4 MG/2ML IJ SOLN
4.0000 mg | Freq: Four times a day (QID) | INTRAMUSCULAR | Status: AC | PRN
  Administered 2013-04-04: 4 mg via INTRAVENOUS
  Filled 2013-04-04: qty 2

## 2013-04-04 MED ORDER — HYDROCODONE-ACETAMINOPHEN 5-325 MG PO TABS: 1.0000 | ORAL_TABLET | ORAL | Status: DC | PRN

## 2013-04-04 MED ORDER — HYDROMORPHONE HCL PF 1 MG/ML IJ SOLN
1.0000 mg | Freq: Once | INTRAMUSCULAR | Status: AC
  Administered 2013-04-04: 1 mg via INTRAVENOUS
  Filled 2013-04-04: qty 1

## 2013-04-04 MED ORDER — ONDANSETRON 4 MG PO TBDP: 4.0000 mg | ORAL_TABLET | Freq: Three times a day (TID) | ORAL | Status: DC | PRN

## 2013-04-04 NOTE — ED Notes (Signed)
Patient states she had gallbladder removed in January 2014; states pain feels the same as when she would have gallbladder attacks.  Patient states she had same feelings about a month ago.  States has nausea, but no vomiting.  States has been having diarrhea.

## 2013-04-04 NOTE — ED Provider Notes (Signed)
History     CSN: [id number]  Arrival date & time 04/04/13  0030   First MD Initiated Contact with Patient 04/04/13 0153      Chief Complaint  Patient presents with  . Abdominal Pain    (Consider location/radiation/quality/duration/timing/severity/associated sxs/prior treatment) HPI HPI Comments: Jodi [name] is a 37 y.o. female who presents to the Emergency Department complaining of RUQ abdominal pain. She had her gall bladder removed in January 2014 and yesterday had the first pain similar to gall bladder pain. She had a similar episode a month ago. Today she has had RUQ pain, nausea, no vomiting. The pain radiated to her shoulder. She has been having diarrhea. She has a follow up appointment with Dr. Lodema Hong in August.Denies fever, chills, shortness of breath.   PCP Dr. Lodema Hong GI Dr. Karilyn Cota  Past Medical History  Diagnosis Date  . Asthma   . DVT (deep venous thrombosis)   . Asthma   . Allergic rhinitis   . 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 Hyperlipidemia   . Shortness of breath     SOB even without exertion at times  . GERD (gastroesophageal reflux disease)   . IBS (irritable bowel syndrome)     Past Surgical History  Procedure Laterality Date  . Tubal ligation  1999  . Blood clot removed from lower abdomen  2000  . Cyst removed from ovary and fluid removed from tubes  2000  . Partial hysterectomy  2009  . Abdominal hysterectomy    . Esophagogastroduodenoscopy  10/22/2012    Procedure: ESOPHAGOGASTRODUODENOSCOPY (EGD);  Surgeon: Malissa Hippo, MD;  Location: [facility];  Service: Endoscopy;  Laterality: N/A;  100  . Colonoscopy  11/04/2012    Procedure: COLONOSCOPY;  Surgeon: Malissa Hippo, MD;  [facility]: [facility];  Service: Endoscopy;  Laterality: N/A;  915  . Cholecystectomy  11/12/2012    Procedure: LAPAROSCOPIC CHOLECYSTECTOMY;  Surgeon: Dalia Heading, MD;  Location: [facility];  Service: General;  Laterality: N/A;    Family History   Problem Relation Age of Onset  . Asthma Mother   . Hypertension Mother   . Bronchiolitis Mother   . Hypertension Father   . Hyperlipidemia Father     recurrent rectum polyps   . Diabetes Sister   . Lupus Sister   . Depression Sister     History  Substance Use Topics  . Smoking status: Former Smoker -- 0.25 packs/day for 10 years    Types: Cigarettes  . Smokeless tobacco: Not on file  . Alcohol Use: No    OB History   Grav Para Term Preterm Abortions TAB SAB Ect Mult Living                  Review of Systems  Constitutional: Negative for fever.       10 Systems reviewed and are negative for acute change except as noted in the HPI.  HENT: Negative for congestion.   Eyes: Negative for discharge and redness.  Respiratory: Negative for cough and shortness of breath.   Cardiovascular: Negative for chest pain.  Gastrointestinal: Positive for nausea and abdominal pain. Negative for vomiting.  Musculoskeletal: Negative for back pain.  Skin: Negative for rash.  Neurological: Negative for syncope, numbness and headaches.  Psychiatric/Behavioral:       No behavior change.    Allergies  Compazine and Compazine  Home Medications   Current Outpatient Rx  Name  Route  Sig  Dispense  Refill  . albuterol (PROVENTIL) (2.5 MG/3ML) 0.083% nebulizer solution   Nebulization   Take 3 mLs (2.5 mg total) by nebulization every 4 (four) hours. Use every 4 hours for the next 2 days; then every 4 hours as needed for wheezing, chest tightness, or shortness of breath.   75 mL   3   . albuterol (VENTOLIN HFA) 108 (90 BASE) MCG/ACT inhaler   Inhalation   Inhale 2 puffs into the lungs every 4 (four) hours as needed.   1 Inhaler   3   . dicyclomine (BENTYL) 10 MG capsule   Oral   Take 1 capsule (10 mg total) by mouth 3 (three) times daily before meals.   90 capsule   2   . fluticasone (FLONASE) 50 MCG/ACT nasal spray   Nasal   Place 2 sprays into the nose daily.   16 g   4   .  fluticasone-salmeterol (ADVAIR HFA) 230-21 MCG/ACT inhaler   Inhalation   Inhale 2 puffs into the lungs 2 (two) times daily.   1 Inhaler   12   . levocetirizine (XYZAL) 5 MG tablet   Oral   Take 1 tablet (5 mg total) by mouth every evening.   30 tablet   4   . montelukast (SINGULAIR) 10 MG tablet   Oral   Take 10 mg by mouth every morning.         Marland Kitchen omeprazole (PRILOSEC) 20 MG capsule   Oral   Take 1 capsule (20 mg total) by mouth 2 (two) times daily.   60 capsule   3   . HYDROcodone-acetaminophen (NORCO) 7.5-325 MG per tablet   Oral   Take 1 tablet by mouth every 6 (six) hours as needed for pain.         . naproxen (NAPROSYN) 375 MG tablet   Oral   Take 1 tablet (375 mg total) by mouth 2 (two) times daily with a meal.   60 tablet   2   . oxyCODONE-acetaminophen (PERCOCET) 5-325 MG per tablet   Oral   Take 2 tablets by mouth every 4 (four) hours as needed for pain.   10 tablet   0     BP 135/74  Pulse 92  Temp(Src) 98.3 F (36.8 C) (Oral)  Resp 22  Ht 5\' 6"  (1.676 m)  Wt 167 lb (75.751 kg)  BMI 26.97 kg/m2  SpO2 100%  LMP 05/17/2012  Physical Exam  Nursing note and vitals reviewed. Constitutional: She appears well-developed and well-nourished.  Awake, alert, nontoxic appearance.  HENT:  Head: Normocephalic and atraumatic.  Eyes: EOM are normal. Pupils are equal, round, and reactive to light.  Neck: Normal range of motion. Neck supple.  Cardiovascular: Normal rate and intact distal pulses.   Pulmonary/Chest: Effort normal and breath sounds normal. She exhibits no tenderness.  Abdominal: Soft. Bowel sounds are normal. There is no tenderness. There is no rebound.  Musculoskeletal: She exhibits no tenderness.  Baseline ROM, no obvious new focal weakness.  Neurological:  Mental status and motor strength appears baseline for patient and situation.  Skin: No rash noted.  Psychiatric: She has a normal mood and affect.    ED Course  Procedures  (including critical care time) Results for orders placed during the hospital encounter of 04/04/13  CBC WITH DIFFERENTIAL      Result Value Range   WBC 5.9  4.0 - 10.5 K/uL   RBC 4.02  3.87 - 5.11 MIL/uL  Hemoglobin 12.2  12.0 - 15.0 g/dL   HCT 16.1 (*) 09.6 - 04.5 %   MCV 85.1  78.0 - 100.0 fL   MCH 30.3  26.0 - 34.0 pg   MCHC 35.7  30.0 - 36.0 g/dL   RDW 40.9  81.1 - 91.4 %   Platelets 336  150 - 400 K/uL   Neutrophils Relative % 47  43 - 77 %   Neutro Abs 2.8  1.7 - 7.7 K/uL   Lymphocytes Relative 42  12 - 46 %   Lymphs Abs 2.5  0.7 - 4.0 K/uL   Monocytes Relative 8  3 - 12 %   Monocytes Absolute 0.4  0.1 - 1.0 K/uL   Eosinophils Relative 3  0 - 5 %   Eosinophils Absolute 0.2  0.0 - 0.7 K/uL   Basophils Relative 1  0 - 1 %   Basophils Absolute 0.0  0.0 - 0.1 K/uL  COMPREHENSIVE METABOLIC PANEL      Result Value Range   Sodium 137  135 - 145 mEq/L   Potassium 3.4 (*) 3.5 - 5.1 mEq/L   Chloride 98  96 - 112 mEq/L   CO2 25  19 - 32 mEq/L   Glucose, Bld 93  70 - 99 mg/dL   BUN 8  6 - 23 mg/dL   Creatinine, Ser 7.82  0.50 - 1.10 mg/dL   Calcium 9.6  8.4 - 95.6 mg/dL   Total Protein 7.1  6.0 - 8.3 g/dL   Albumin 4.1  3.5 - 5.2 g/dL   AST 21  0 - 37 U/L   ALT 17  0 - 35 U/L   Alkaline Phosphatase 97  39 - 117 U/L   Total Bilirubin 0.4  0.3 - 1.2 mg/dL   GFR calc non Af Amer 83 (*) >90 mL/min   GFR calc Af Amer >90  >90 mL/min  LIPASE, BLOOD      Result Value Range   Lipase 13  11 - 59 U/L  URINALYSIS, ROUTINE W REFLEX MICROSCOPIC      Result Value Range   Color, Urine YELLOW  YELLOW   APPearance CLEAR  CLEAR   Specific Gravity, Urine <1.005 (*) 1.005 - 1.030   pH 6.5  5.0 - 8.0   Glucose, UA NEGATIVE  NEGATIVE mg/dL   Hgb urine dipstick NEGATIVE  NEGATIVE   Bilirubin Urine NEGATIVE  NEGATIVE   Ketones, ur NEGATIVE  NEGATIVE mg/dL   Protein, ur NEGATIVE  NEGATIVE mg/dL   Urobilinogen, UA 0.2  0.0 - 1.0 mg/dL   Nitrite NEGATIVE  NEGATIVE   Leukocytes, UA NEGATIVE   NEGATIVE  PREGNANCY, URINE      Result Value Range   Preg Test, Ur NEGATIVE  NEGATIVE      0236 Patient states that first dilaudid is wearing off and pain is returning. Will order additional dilaudid. Reviewed labs with the patient. Advised her to follow up with Dr. Karilyn Cota.  MDM  Patient presents with RUQ pain that feels similar to gall bladder pain. She had an ERCP done before her gall bladder surgery and did not have stones. Labs are normal. Responded well to dilaudid and zofran. She will follow up with Dr. Karilyn Cota. Pt stable in ED with no significant deterioration in condition.The patient appears reasonably screened and/or stabilized for discharge and I doubt any other medical condition or other [facility] requiring further screening, evaluation, or treatment in the ED at this time prior to discharge.  MDM Reviewed:  nursing note and vitals Interpretation: labs           Nicoletta Dress. Colon Branch, MD 04/04/13 [phone]

## 2013-04-05 ENCOUNTER — Ambulatory Visit (INDEPENDENT_AMBULATORY_CARE_PROVIDER_SITE_OTHER): Payer: Managed Care, Other (non HMO) | Admitting: Internal Medicine

## 2013-04-05 ENCOUNTER — Encounter (INDEPENDENT_AMBULATORY_CARE_PROVIDER_SITE_OTHER): Payer: Self-pay | Admitting: Internal Medicine

## 2013-04-05 VITALS — BP 110/80 | HR 60 | Ht 66.0 in | Wt 165.1 lb

## 2013-04-05 DIAGNOSIS — R1011 Right upper quadrant pain: Secondary | ICD-10-CM

## 2013-04-05 DIAGNOSIS — R52 Pain, unspecified: Secondary | ICD-10-CM

## 2013-04-05 NOTE — Patient Instructions (Addendum)
Advised if pain returns to call our office and we will get a stat Cmet. May eventually an EUS in the future if pain continues.

## 2013-04-05 NOTE — Progress Notes (Signed)
Subjective:     Patient ID: Jodi [name], female   DOB: [birth year], 36 y.o.   MRN: [id number]  HPI Here today for f/u of recent visit to the ED x 2 for rt upper quadrant pain radiating into her rt shoulder. Same type of pain as in the past.  She tells me the pain occurred after eating pizza. The pain was sudden.  All labs in the ED were WNL. [place] normal. She describes the pain when it occurs as a dull ache.  Hx of rt upper quadrant pain and has had multiple test in the past. Today she feels better.    She has undergone multiple studies in the past for rt upper abdominal pain. She has had [place], HIDA scan, Abdominal/pelvic CT and well as and EGD. All were  negative. Lab studies have also been normal.  Recently underwent a cholecystectomy in January of this year for cholecystitis. No hx of cholelithiasis.  Appetite is good. There has been no weight loss. No fever or chills.    [place] 03/12/2013 Normal CBC 4mm.  10/22/12 EGD: Dr. Karilyn Cota  Impression:  Normal esophagogastroduodenoscopy.  Suspect pain multifactorial. We'll reevaluate patient in one week. If she does not improve with therapy we will consider surgical consultation.    11/04/2012 Colonoscopy for heme positive stool: Dr. Karilyn Cota:  Impression:  Normal terminal ileum.  Few scattered diverticula throughout colon.  No evidence of endoscopic colitis or polyps.  Small external hemorrhoids.   11/12/2012 Cholecystectomy for chronic cholecystitis Dr. Lovell Sheehan  [facility]     Component Value Date/Time   NA 137 04/04/2013 0119   K 3.4* 04/04/2013 0119   CL 98 04/04/2013 0119   CO2 25 04/04/2013 0119   GLUCOSE 93 04/04/2013 0119   BUN 8 04/04/2013 0119   CREATININE 0.87 04/04/2013 0119   CREATININE 0.84 10/05/2012 1110   CALCIUM 9.6 04/04/2013 0119   PROT 7.1 04/04/2013 0119   ALBUMIN 4.1 04/04/2013 0119   AST 21 04/04/2013 0119   ALT 17 04/04/2013 0119   ALKPHOS 97 04/04/2013 0119   BILITOT 0.4 04/04/2013 0119   GFRNONAA 83* 04/04/2013 0119   GFRAA >90  04/04/2013 0119    CBC    Component Value Date/Time   WBC 5.9 04/04/2013 0119   RBC 4.02 04/04/2013 0119   HGB 12.2 04/04/2013 0119   HCT 34.2* 04/04/2013 0119   PLT 336 04/04/2013 0119   MCV 85.1 04/04/2013 0119   MCH 30.3 04/04/2013 0119   MCHC 35.7 04/04/2013 0119   RDW 13.0 04/04/2013 0119   LYMPHSABS 2.5 04/04/2013 0119   MONOABS 0.4 04/04/2013 0119   EOSABS 0.2 04/04/2013 0119   BASOSABS 0.0 04/04/2013 0119       Review of Systems see hpi Current Outpatient Prescriptions  Medication Sig Dispense Refill  . albuterol (PROVENTIL) (2.5 MG/3ML) 0.083% nebulizer solution Take 3 mLs (2.5 mg total) by nebulization every 4 (four) hours. Use every 4 hours for the next 2 days; then every 4 hours as needed for wheezing, chest tightness, or shortness of breath.  75 mL  3  . albuterol (VENTOLIN HFA) 108 (90 BASE) MCG/ACT inhaler Inhale 2 puffs into the lungs every 4 (four) hours as needed.  1 Inhaler  3  . dicyclomine (BENTYL) 10 MG capsule Take 1 capsule (10 mg total) by mouth 3 (three) times daily before meals.  90 capsule  2  . fluticasone (FLONASE) 50 MCG/ACT nasal spray Place 2 sprays into the nose daily.  16 g  4  . fluticasone-salmeterol (ADVAIR HFA) 230-21 MCG/ACT inhaler Inhale 2 puffs into the lungs 2 (two) times daily.  1 Inhaler  12  . HYDROcodone-acetaminophen (NORCO/VICODIN) 5-325 MG per tablet Take 1 tablet by mouth every 4 (four) hours as needed for pain.  15 tablet  0  . levocetirizine (XYZAL) 5 MG tablet Take 1 tablet (5 mg total) by mouth every evening.  30 tablet  4  . montelukast (SINGULAIR) 10 MG tablet Take 10 mg by mouth every morning.      Marland Kitchen omeprazole (PRILOSEC) 20 MG capsule Take 1 capsule (20 mg total) by mouth 2 (two) times daily.  60 capsule  3   No current facility-administered medications for this visit.   Past Medical History  Diagnosis Date  . Asthma   . DVT (deep venous thrombosis)   . Asthma   . Allergic rhinitis   . 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 Hyperlipidemia   . Shortness of breath     SOB even without exertion at times  . GERD (gastroesophageal reflux disease)   . IBS (irritable bowel syndrome)    Past Surgical History  Procedure Laterality Date  . Tubal ligation  1999  . Blood clot removed from lower abdomen  2000  . Cyst removed from ovary and fluid removed from tubes  2000  . Partial hysterectomy  2009  . Abdominal hysterectomy    . Esophagogastroduodenoscopy  10/22/2012    Procedure: ESOPHAGOGASTRODUODENOSCOPY (EGD);  Surgeon: Malissa Hippo, MD;  [facility]: [facility];  Service: Endoscopy;  Laterality: N/A;  100  . Colonoscopy  11/04/2012    Procedure: COLONOSCOPY;  Surgeon: Malissa Hippo, MD;  [facility]: [facility];  Service: Endoscopy;  Laterality: N/A;  915  . Cholecystectomy  11/12/2012    Procedure: LAPAROSCOPIC CHOLECYSTECTOMY;  Surgeon: Dalia Heading, MD;  Location: [facility];  Service: General;  Laterality: N/A;   Allergies  Allergen Reactions  . Compazine Other (See Comments)    Face swells, tongue sticks out  . Compazine (Prochlorperazine Edisylate)         Objective:   Physical Exam  Filed Vitals:   04/05/13 1533  BP: 110/80  Pulse: 60  Height: 5\' 6"  (1.676 m)  Weight: 165 lb 1.6 oz (74.889 kg)   Alert and oriented. Skin warm and dry. Oral mucosa is moist.   . Sclera anicteric, conjunctivae is pink. Thyroid not enlarged. No cervical lymphadenopathy. Lungs clear. Heart regular rate and rhythm.  Abdomen is soft. Bowel sounds are positive. No hepatomegaly. No abdominal masses felt. No tenderness.  No edema to lower extremities.       Assessment:    RT upper quadrant pain ? Etiology. All labs and [place] were normal.    Plan:    She will call when pain reoccurs. Will get a CMet to be sure her transaminases are normal.    Naproxen for pain when pain reoccurs.

## 2013-04-12 ENCOUNTER — Emergency Department (HOSPITAL_COMMUNITY)
Admission: EM | Admit: 2013-04-12 | Discharge: 2013-04-12 | Disposition: A | Discharge status: Home / Self Care | Payer: 59 | Attending: Emergency Medicine | Admitting: Emergency Medicine

## 2013-04-12 ENCOUNTER — Encounter (HOSPITAL_COMMUNITY): Payer: Self-pay | Admitting: Emergency Medicine

## 2013-04-12 DIAGNOSIS — Z9889 Other specified postprocedural states: Secondary | ICD-10-CM | POA: Insufficient documentation

## 2013-04-12 DIAGNOSIS — Z862 Personal history of diseases of the blood and blood-forming organs and certain disorders involving the immune mechanism: Secondary | ICD-10-CM | POA: Insufficient documentation

## 2013-04-12 DIAGNOSIS — Z9089 Acquired absence of other organs: Secondary | ICD-10-CM | POA: Insufficient documentation

## 2013-04-12 DIAGNOSIS — K219 Gastro-esophageal reflux disease without esophagitis: Secondary | ICD-10-CM | POA: Insufficient documentation

## 2013-04-12 DIAGNOSIS — Z9851 Tubal ligation status: Secondary | ICD-10-CM | POA: Insufficient documentation

## 2013-04-12 DIAGNOSIS — R110 Nausea: Secondary | ICD-10-CM | POA: Insufficient documentation

## 2013-04-12 DIAGNOSIS — R1011 Right upper quadrant pain: Secondary | ICD-10-CM | POA: Insufficient documentation

## 2013-04-12 DIAGNOSIS — Z8709 Personal history of other diseases of the respiratory system: Secondary | ICD-10-CM | POA: Insufficient documentation

## 2013-04-12 DIAGNOSIS — Z8639 Personal history of other endocrine, nutritional and metabolic disease: Secondary | ICD-10-CM | POA: Insufficient documentation

## 2013-04-12 DIAGNOSIS — R109 Unspecified abdominal pain: Secondary | ICD-10-CM

## 2013-04-12 DIAGNOSIS — J45909 Unspecified asthma, uncomplicated: Secondary | ICD-10-CM | POA: Insufficient documentation

## 2013-04-12 DIAGNOSIS — Z79899 Other long term (current) drug therapy: Secondary | ICD-10-CM | POA: Insufficient documentation

## 2013-04-12 DIAGNOSIS — Z86718 Personal history of other venous thrombosis and embolism: Secondary | ICD-10-CM | POA: Insufficient documentation

## 2013-04-12 DIAGNOSIS — Z90710 Acquired absence of both cervix and uterus: Secondary | ICD-10-CM | POA: Insufficient documentation

## 2013-04-12 DIAGNOSIS — Z8719 Personal history of other diseases of the digestive system: Secondary | ICD-10-CM | POA: Insufficient documentation

## 2013-04-12 DIAGNOSIS — Z87891 Personal history of nicotine dependence: Secondary | ICD-10-CM | POA: Insufficient documentation

## 2013-04-12 LAB — COMPREHENSIVE METABOLIC PANEL
CO2: 26 mEq/L (ref 19–32)
Calcium: 9.3 mg/dL (ref 8.4–10.5)
Creatinine, Ser: 0.94 mg/dL (ref 0.50–1.10)
GFR calc Af Amer: 88 mL/min — ABNORMAL LOW (ref >90)
GFR calc non Af Amer: 76 mL/min — ABNORMAL LOW (ref >90)
Glucose, Bld: 92 mg/dL (ref 70–99)
Total Protein: 6.8 g/dL (ref 6.0–8.3)

## 2013-04-12 LAB — CBC WITH DIFFERENTIAL/PLATELET
Basophils Absolute: 0 10*3/uL (ref 0.0–0.1)
Eosinophils Absolute: 0.2 10*3/uL (ref 0.0–0.7)
Eosinophils Relative: 2 % (ref 0–5)
HCT: 31.3 % — ABNORMAL LOW (ref 36.0–46.0)
Lymphocytes Relative: 28 % (ref 12–46)
Lymphs Abs: 1.9 10*3/uL (ref 0.7–4.0)
MCH: 30.5 pg (ref 26.0–34.0)
MCV: 86 fL (ref 78.0–100.0)
Monocytes Absolute: 0.6 10*3/uL (ref 0.1–1.0)
Platelets: 276 10*3/uL (ref 150–400)
RDW: 13 % (ref 11.5–15.5)
WBC: 6.7 10*3/uL (ref 4.0–10.5)

## 2013-04-12 LAB — LIPASE, BLOOD: Lipase: 12 U/L (ref 11–59)

## 2013-04-12 LAB — URINALYSIS, ROUTINE W REFLEX MICROSCOPIC
Leukocytes, UA: NEGATIVE
Nitrite: NEGATIVE
Specific Gravity, Urine: 1.021 (ref 1.005–1.030)
Urobilinogen, UA: 0.2 mg/dL (ref 0.0–1.0)
pH: 6 (ref 5.0–8.0)

## 2013-04-12 MED ORDER — ONDANSETRON HCL 4 MG/2ML IJ SOLN
4.0000 mg | Freq: Once | INTRAMUSCULAR | Status: AC
Start: 2013-04-12 — End: 2013-04-12
  Administered 2013-04-12: 4 mg via INTRAVENOUS
  Filled 2013-04-12: qty 2

## 2013-04-12 MED ORDER — HYDROMORPHONE HCL PF 1 MG/ML IJ SOLN
1.0000 mg | Freq: Once | INTRAMUSCULAR | Status: AC
  Administered 2013-04-12: 1 mg via INTRAVENOUS
  Filled 2013-04-12: qty 1

## 2013-04-12 MED ORDER — HYDROCODONE-ACETAMINOPHEN 5-325 MG PO TABS: 1.0000 | ORAL_TABLET | ORAL | Status: DC | PRN

## 2013-04-12 MED ORDER — LORAZEPAM 2 MG/ML IJ SOLN
1.0000 mg | Freq: Once | INTRAMUSCULAR | Status: AC
  Administered 2013-04-12: 1 mg via INTRAVENOUS
  Filled 2013-04-12: qty 1

## 2013-04-12 MED ORDER — ONDANSETRON 8 MG PO TBDP: 8.0000 mg | ORAL_TABLET | Freq: Three times a day (TID) | ORAL | Status: DC | PRN

## 2013-04-12 NOTE — ED Notes (Signed)
Pt aware of need of urine sample. States that she will attempt soon.

## 2013-04-12 NOTE — ED Notes (Addendum)
Pt states she had her gallbladder out in January and has since had several attacks that feel similar to a gallbladder attack. Her GI doc stated that she needed an endoscopy to look for 'liver stones' blocking her bile duct but they could not do it until she had another 'attack' like the one today. R sided pain. Took vicodin 2 hours ago w/ no relief.

## 2013-04-12 NOTE — ED Provider Notes (Signed)
History    CSN: [id number] Arrival date & time 04/12/13  4098  First MD Initiated Contact with Patient 04/12/13 1934     Chief Complaint  Patient presents with  . Abdominal Pain   (Consider location/radiation/quality/duration/timing/severity/associated sxs/prior Treatment) Patient is a 37 y.o. female presenting with abdominal pain. The history is provided by the patient.  Abdominal Pain Associated symptoms include abdominal pain.   patient here complaining of breath quadrant pain which is been persistent times one month. Hasn't seen by her gastroenterologist and there is concern is she may have a retained ductal stone. She recently did have a cholecystectomy. Pain is been persistent and is not worse with eating. She notes nausea but no vomiting. No diarrhea. No fever or chills. Denies any urinary symptoms. Has used over-the-counter medications without relief Past Medical History  Diagnosis Date  . Asthma   . DVT (deep venous thrombosis)   . Asthma   . Allergic rhinitis   . 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 Hyperlipidemia   . Shortness of breath     SOB even without exertion at times  . GERD (gastroesophageal reflux disease)   . IBS (irritable bowel syndrome)    Past Surgical History  Procedure Laterality Date  . Tubal ligation  1999  . Blood clot removed from lower abdomen  2000  . Cyst removed from ovary and fluid removed from tubes  2000  . Partial hysterectomy  2009  . Abdominal hysterectomy    . Esophagogastroduodenoscopy  10/22/2012    Procedure: ESOPHAGOGASTRODUODENOSCOPY (EGD);  Surgeon: Malissa Hippo, MD;  Location: [facility];  Service: Endoscopy;  Laterality: N/A;  100  . Colonoscopy  11/04/2012    Procedure: COLONOSCOPY;  Surgeon: Malissa Hippo, MD;  Location: [facility];  Service: Endoscopy;  Laterality: N/A;  915  . Cholecystectomy  11/12/2012    Procedure: LAPAROSCOPIC CHOLECYSTECTOMY;  Surgeon: Dalia Heading, MD;  Location: [facility];   Service: General;  Laterality: N/A;   Family History  Problem Relation Age of Onset  . Asthma Mother   . Hypertension Mother   . Bronchiolitis Mother   . Hypertension Father   . Hyperlipidemia Father     recurrent rectum polyps   . Diabetes Sister   . Lupus Sister   . Depression Sister    History  Substance Use Topics  . Smoking status: Former Smoker -- 0.25 packs/day for 10 years    Types: Cigarettes  . Smokeless tobacco: Not on file  . Alcohol Use: No   OB History   Grav Para Term Preterm Abortions TAB SAB Ect Mult Living                 Review of Systems  Gastrointestinal: Positive for abdominal pain.  All other systems reviewed and are negative.    Allergies  Compazine and Compazine  Home Medications   Current Outpatient Rx  Name  Route  Sig  Dispense  Refill  . albuterol (PROVENTIL HFA;VENTOLIN HFA) 108 (90 BASE) MCG/ACT inhaler   Inhalation   Inhale 2 puffs into the lungs every 4 (four) hours as needed for wheezing or shortness of breath.         Marland Kitchen albuterol (PROVENTIL) (2.5 MG/3ML) 0.083% nebulizer solution   Nebulization   Take 2.5 mg by nebulization every 4 (four) hours as needed for wheezing or shortness of breath.         . dicyclomine (BENTYL) 10 MG  capsule   Oral   Take 1 capsule (10 mg total) by mouth 3 (three) times daily before meals.   90 capsule   2   . fluticasone (FLONASE) 50 MCG/ACT nasal spray   Nasal   Place 2 sprays into the nose daily.   16 g   4   . fluticasone-salmeterol (ADVAIR HFA) 230-21 MCG/ACT inhaler   Inhalation   Inhale 2 puffs into the lungs 2 (two) times daily.   1 Inhaler   12   . HYDROcodone-acetaminophen (NORCO/VICODIN) 5-325 MG per tablet   Oral   Take 1 tablet by mouth every 4 (four) hours as needed for pain.   15 tablet   0   . levocetirizine (XYZAL) 5 MG tablet   Oral   Take 1 tablet (5 mg total) by mouth every evening.   30 tablet   4   . montelukast (SINGULAIR) 10 MG tablet   Oral   Take  10 mg by mouth every morning.         Marland Kitchen omeprazole (PRILOSEC) 20 MG capsule   Oral   Take 1 capsule (20 mg total) by mouth 2 (two) times daily.   60 capsule   3    BP 140/82  Pulse 83  Temp(Src) 98.9 F (37.2 C)  Resp 16  Ht 5\' 6"  (1.676 m)  Wt 163 lb (73.936 kg)  BMI 26.32 kg/m2  SpO2 100%  LMP 05/17/2012 Physical Exam  Nursing note and vitals reviewed. Constitutional: She is oriented to person, place, and time. She appears well-developed and well-nourished.  Non-toxic appearance. No distress.  HENT:  Head: Normocephalic and atraumatic.  Eyes: Conjunctivae, EOM and lids are normal. Pupils are equal, round, and reactive to light.  Neck: Normal range of motion. Neck supple. No tracheal deviation present. No mass present.  Cardiovascular: Normal rate, regular rhythm and normal heart sounds.  Exam reveals no gallop.   No murmur heard. Pulmonary/Chest: Effort normal and breath sounds normal. No stridor. No respiratory distress. She has no decreased breath sounds. She has no wheezes. She has no rhonchi. She has no rales.  Abdominal: Soft. Normal appearance and bowel sounds are normal. She exhibits no distension. There is tenderness in the right upper quadrant. There is no rigidity, no rebound, no guarding and no CVA tenderness.    Musculoskeletal: Normal range of motion. She exhibits no edema and no tenderness.  Neurological: She is alert and oriented to person, place, and time. She has normal strength. No cranial nerve deficit or sensory deficit. GCS eye subscore is 4. GCS verbal subscore is 5. GCS motor subscore is 6.  Skin: Skin is warm and dry. No abrasion and no rash noted.  Psychiatric: She has a normal mood and affect. Her speech is normal and behavior is normal.    ED Course  Procedures (including critical care time) Labs Reviewed  CBC WITH DIFFERENTIAL  COMPREHENSIVE METABOLIC PANEL  URINALYSIS, ROUTINE W REFLEX MICROSCOPIC  LIPASE, BLOOD   No results found. No  diagnosis found.  MDM  Patient given IV fluids and pain meds here along with antibiotics and she feels better now. Labs reviewed and are without acute findings. She is encouraged to followup with her gastroenterologist  Toy Baker, MD 04/12/13 2151

## 2013-04-13 ENCOUNTER — Telehealth (INDEPENDENT_AMBULATORY_CARE_PROVIDER_SITE_OTHER): Payer: Self-pay | Admitting: *Deleted

## 2013-04-13 NOTE — Telephone Encounter (Signed)
Forwarded to Dr.Rehman. 

## 2013-04-13 NOTE — Telephone Encounter (Signed)
Jodi [name] said she is having another attack and was told by Dorene Ar, NP to call the office then she would get her scheduled for an EGD. Jodi [name] said it feels like she is having a gallbladder attack which has been removed. She has went from Diarrhea to Constipation with a little bit of indigestion. Her return phone number is [phone].

## 2013-04-13 NOTE — Telephone Encounter (Signed)
Patient was seen by Dorene Ar NP in June 2014.  Plan:   She will call when pain reoccurs. Will get a CMet to be sure her transaminases are normal.  Naproxen for pain when pain reoccurs.  Patient was seen in the ED 04/12/13 for Abdominal Pain. This will be addressed with Dr.Rehman. Return telephone number is [phone]. This will be addressed by Dr.Rehman and then the patient will be contacted.

## 2013-04-14 NOTE — Telephone Encounter (Signed)
Per Dr.Rehman the patient needs a EUS  Patient had recent labs at [facility] ED on 04/12/13 Forwarded to [place] to arrange

## 2013-04-14 NOTE — Telephone Encounter (Signed)
Referral faxed to [facility] @ Dr Christella Hartigan office, they will review and call patient with appt -- I left message on patient's voice mail advising her

## 2013-04-14 NOTE — Telephone Encounter (Signed)
Patient had EGD in January 2014. She needs EUS. LFTs at the time of ER visit.

## 2013-04-19 ENCOUNTER — Other Ambulatory Visit: Payer: Self-pay

## 2013-04-19 ENCOUNTER — Telehealth: Payer: Self-pay | Admitting: Gastroenterology

## 2013-04-19 DIAGNOSIS — R109 Unspecified abdominal pain: Secondary | ICD-10-CM

## 2013-04-19 NOTE — Telephone Encounter (Signed)
EUS scheduled, pt instructed and medications reviewed.  Patient instructions mailed to home.  Patient to call with any questions or concerns.  

## 2013-04-19 NOTE — Telephone Encounter (Signed)
Left message on machine to call back  

## 2013-04-20 ENCOUNTER — Encounter (HOSPITAL_COMMUNITY): Payer: Self-pay | Admitting: Pharmacy Technician

## 2013-04-20 ENCOUNTER — Encounter: Payer: Self-pay | Admitting: Gastroenterology

## 2013-04-25 ENCOUNTER — Encounter (HOSPITAL_COMMUNITY): Payer: Self-pay | Admitting: *Deleted

## 2013-04-25 ENCOUNTER — Emergency Department (HOSPITAL_COMMUNITY)
Admission: EM | Admit: 2013-04-25 | Discharge: 2013-04-26 | Disposition: A | Discharge status: Home / Self Care | Payer: 59 | Attending: Emergency Medicine | Admitting: Emergency Medicine

## 2013-04-25 DIAGNOSIS — R1011 Right upper quadrant pain: Secondary | ICD-10-CM | POA: Insufficient documentation

## 2013-04-25 DIAGNOSIS — Z8639 Personal history of other endocrine, nutritional and metabolic disease: Secondary | ICD-10-CM | POA: Insufficient documentation

## 2013-04-25 DIAGNOSIS — Z862 Personal history of diseases of the blood and blood-forming organs and certain disorders involving the immune mechanism: Secondary | ICD-10-CM | POA: Insufficient documentation

## 2013-04-25 DIAGNOSIS — Z86718 Personal history of other venous thrombosis and embolism: Secondary | ICD-10-CM | POA: Insufficient documentation

## 2013-04-25 DIAGNOSIS — J45909 Unspecified asthma, uncomplicated: Secondary | ICD-10-CM | POA: Insufficient documentation

## 2013-04-25 DIAGNOSIS — IMO0002 Reserved for concepts with insufficient information to code with codable children: Secondary | ICD-10-CM | POA: Insufficient documentation

## 2013-04-25 DIAGNOSIS — K219 Gastro-esophageal reflux disease without esophagitis: Secondary | ICD-10-CM | POA: Insufficient documentation

## 2013-04-25 DIAGNOSIS — Z8709 Personal history of other diseases of the respiratory system: Secondary | ICD-10-CM | POA: Insufficient documentation

## 2013-04-25 DIAGNOSIS — R110 Nausea: Secondary | ICD-10-CM | POA: Insufficient documentation

## 2013-04-25 DIAGNOSIS — Z872 Personal history of diseases of the skin and subcutaneous tissue: Secondary | ICD-10-CM | POA: Insufficient documentation

## 2013-04-25 DIAGNOSIS — G8929 Other chronic pain: Secondary | ICD-10-CM | POA: Insufficient documentation

## 2013-04-25 DIAGNOSIS — Z87891 Personal history of nicotine dependence: Secondary | ICD-10-CM | POA: Insufficient documentation

## 2013-04-25 DIAGNOSIS — Z79899 Other long term (current) drug therapy: Secondary | ICD-10-CM | POA: Insufficient documentation

## 2013-04-25 DIAGNOSIS — R109 Unspecified abdominal pain: Secondary | ICD-10-CM

## 2013-04-25 DIAGNOSIS — Z8719 Personal history of other diseases of the digestive system: Secondary | ICD-10-CM | POA: Insufficient documentation

## 2013-04-25 LAB — COMPREHENSIVE METABOLIC PANEL
BUN: 9 mg/dL (ref 6–23)
CO2: 25 mEq/L (ref 19–32)
Chloride: 100 mEq/L (ref 96–112)
Creatinine, Ser: 0.91 mg/dL (ref 0.50–1.10)
GFR calc Af Amer: >90 mL/min (ref >90)
GFR calc non Af Amer: 79 mL/min — ABNORMAL LOW (ref >90)
Total Bilirubin: 0.5 mg/dL (ref 0.3–1.2)

## 2013-04-25 LAB — CBC WITH DIFFERENTIAL/PLATELET
HCT: 34.6 % — ABNORMAL LOW (ref 36.0–46.0)
Hemoglobin: 12.1 g/dL (ref 12.0–15.0)
Lymphocytes Relative: 39 % (ref 12–46)
MCHC: 35 g/dL (ref 30.0–36.0)
Monocytes Absolute: 0.5 10*3/uL (ref 0.1–1.0)
Monocytes Relative: 8 % (ref 3–12)
Neutro Abs: 2.9 10*3/uL (ref 1.7–7.7)

## 2013-04-25 LAB — LIPASE, BLOOD: Lipase: 10 U/L — ABNORMAL LOW (ref 11–59)

## 2013-04-25 MED ORDER — HYDROMORPHONE HCL PF 1 MG/ML IJ SOLN
1.0000 mg | Freq: Once | INTRAMUSCULAR | Status: AC
  Administered 2013-04-25: 1 mg via INTRAVENOUS
  Filled 2013-04-25: qty 1

## 2013-04-25 MED ORDER — SODIUM CHLORIDE 0.9 % IV SOLN
Freq: Once | INTRAVENOUS | Status: AC
  Administered 2013-04-25: via INTRAVENOUS

## 2013-04-25 MED ORDER — ONDANSETRON HCL 4 MG/2ML IJ SOLN
4.0000 mg | Freq: Once | INTRAMUSCULAR | Status: AC
  Administered 2013-04-25: 4 mg via INTRAVENOUS
  Filled 2013-04-25: qty 2

## 2013-04-25 NOTE — ED Provider Notes (Signed)
History    CSN: [id number] Arrival date & time 04/25/13  2227  First MD Initiated Contact with Patient 04/25/13 2243     Chief Complaint  Patient presents with  . Abdominal Pain   (Consider location/radiation/quality/duration/timing/severity/associated sxs/prior Treatment) HPI Jodi [name] is a 37 y.o. female who presents to ED with complaint of right upper quadrant abdominal pain. States pain has been there for 8 months. Pt has had multiple tests done for this with no explanation for her pain. States she has had [place], CTs, hida scan, had cholecystectomy, with no pain improvement. States she is scheduled for endoscopy [place] on 05/05/13. States  Pain worsened about an hour and a half ago. States did not take any medications at that time. States has nausea, no vomiting, no changes in bowels.   Past Medical History  Diagnosis Date  . Asthma   . DVT (deep venous thrombosis) 10 yrs ago  . Asthma   . Allergic rhinitis   . Hyperlipidemia   . Shortness of breath     SOB even without exertion at times  . GERD (gastroesophageal reflux disease)   . IBS (irritable bowel syndrome)   . 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 Anemia   . Eczema    Past Surgical History  Procedure Laterality Date  . Blood clot removed from lower abdomen  2000  . Cyst removed from ovary and fluid removed from tubes  2000  . Partial hysterectomy  2009  . Esophagogastroduodenoscopy  10/22/2012    Procedure: ESOPHAGOGASTRODUODENOSCOPY (EGD);  Surgeon: Malissa Hippo, MD;  Location: [facility];  Service: Endoscopy;  Laterality: N/A;  100  . Colonoscopy  11/04/2012    Procedure: COLONOSCOPY;  Surgeon: Malissa Hippo, MD;  Location: [facility];  Service: Endoscopy;  Laterality: N/A;  915  . Abdominal hysterectomy  4-5 yrs ago  . Tubal ligation  1999  . Cholecystectomy  11/12/2012    Procedure: LAPAROSCOPIC CHOLECYSTECTOMY;  Surgeon: Dalia Heading, MD;  Location: [facility];  Service: General;   Laterality: N/A;   Family History  Problem Relation Age of Onset  . Asthma Mother   . Hypertension Mother   . Bronchiolitis Mother   . Hypertension Father   . Hyperlipidemia Father     recurrent rectum polyps   . Diabetes Sister   . Lupus Sister   . Depression Sister    History  Substance Use Topics  . Smoking status: Former Smoker -- 0.25 packs/day for 10 years    Types: Cigarettes    Quit date: 10/13/2008  . Smokeless tobacco: Never Used  . Alcohol Use: No   OB History   Grav Para Term Preterm Abortions TAB SAB Ect Mult Living                 Review of Systems  Constitutional: Negative for fever and chills.  HENT: Negative for neck pain and neck stiffness.   Respiratory: Negative.   Cardiovascular: Negative.   Gastrointestinal: Positive for nausea and abdominal pain. Negative for vomiting, diarrhea and constipation.  Genitourinary: Negative for dysuria and flank pain.  Musculoskeletal: Negative for myalgias.  Skin: Negative.   Neurological: Negative for weakness, numbness and headaches.    Allergies  Compazine  Home Medications   Current Outpatient Rx  Name  Route  Sig  Dispense  Refill  . albuterol (PROVENTIL HFA;VENTOLIN HFA) 108 (90 BASE) MCG/ACT inhaler   Inhalation   Inhale 2 puffs  into the lungs every 4 (four) hours as needed for wheezing or shortness of breath.         Marland Kitchen albuterol (PROVENTIL) (2.5 MG/3ML) 0.083% nebulizer solution   Nebulization   Take 2.5 mg by nebulization every 4 (four) hours as needed for wheezing or shortness of breath.         . dicyclomine (BENTYL) 10 MG capsule   Oral   Take 1 capsule (10 mg total) by mouth 3 (three) times daily before meals.   90 capsule   2   . fluticasone (FLONASE) 50 MCG/ACT nasal spray   Nasal   Place 2 sprays into the nose daily.   16 g   4   . fluticasone-salmeterol (ADVAIR HFA) 230-21 MCG/ACT inhaler   Inhalation   Inhale 2 puffs into the lungs 2 (two) times daily.   1 Inhaler   12    . HYDROcodone-acetaminophen (NORCO) 7.5-325 MG per tablet   Oral   Take 1 tablet by mouth every 6 (six) hours as needed for pain.         Marland Kitchen levocetirizine (XYZAL) 5 MG tablet   Oral   Take 1 tablet (5 mg total) by mouth every evening.   30 tablet   4   . montelukast (SINGULAIR) 10 MG tablet   Oral   Take 10 mg by mouth every morning.         . naproxen (NAPROSYN) 500 MG tablet   Oral   Take 500 mg by mouth 2 (two) times daily with a meal.         . omeprazole (PRILOSEC) 20 MG capsule   Oral   Take 1 capsule (20 mg total) by mouth 2 (two) times daily.   60 capsule   3   . ondansetron (ZOFRAN ODT) 8 MG disintegrating tablet   Oral   Take 1 tablet (8 mg total) by mouth every 8 (eight) hours as needed for nausea.   20 tablet   0    BP 139/86  Pulse 88  Temp(Src) 98.3 F (36.8 C) (Oral)  Resp 22  SpO2 95%  LMP 05/17/2012 Physical Exam  Nursing note and vitals reviewed. Constitutional: She is oriented to person, place, and time. She appears well-developed and well-nourished. She appears distressed.  Pt is rocking in bed in pain  HENT:  Head: Normocephalic.  Eyes: Conjunctivae are normal.  Neck: Neck supple.  Cardiovascular: Normal rate, regular rhythm and normal heart sounds.   Pulmonary/Chest: Effort normal and breath sounds normal. No respiratory distress. She has no wheezes. She has no rales.  Abdominal: Soft. Bowel sounds are normal. She exhibits no distension. There is tenderness. There is no rebound.  RUQ tenderness  Musculoskeletal: Normal range of motion.  Neurological: She is alert and oriented to person, place, and time.  Skin: Skin is warm and dry.    ED Course  Procedures (including critical care time)  Results for orders placed during the hospital encounter of 04/25/13  CBC WITH DIFFERENTIAL      Result Value Range   WBC 5.7  4.0 - 10.5 K/uL   RBC 4.07  3.87 - 5.11 MIL/uL   Hemoglobin 12.1  12.0 - 15.0 g/dL   HCT 16.1 (*) 09.6 - 04.5 %    MCV 85.0  78.0 - 100.0 fL   MCH 29.7  26.0 - 34.0 pg   MCHC 35.0  30.0 - 36.0 g/dL   RDW 40.9  81.1 - 91.4 %   Platelets  314  150 - 400 K/uL   Neutrophils Relative % 51  43 - 77 %   Neutro Abs 2.9  1.7 - 7.7 K/uL   Lymphocytes Relative 39  12 - 46 %   Lymphs Abs 2.2  0.7 - 4.0 K/uL   Monocytes Relative 8  3 - 12 %   Monocytes Absolute 0.5  0.1 - 1.0 K/uL   Eosinophils Relative 2  0 - 5 %   Eosinophils Absolute 0.1  0.0 - 0.7 K/uL   Basophils Relative 1  0 - 1 %   Basophils Absolute 0.0  0.0 - 0.1 K/uL  COMPREHENSIVE METABOLIC PANEL      Result Value Range   Sodium 135  135 - 145 mEq/L   Potassium 3.6  3.5 - 5.1 mEq/L   Chloride 100  96 - 112 mEq/L   CO2 25  19 - 32 mEq/L   Glucose, Bld 91  70 - 99 mg/dL   BUN 9  6 - 23 mg/dL   Creatinine, Ser 4.54  0.50 - 1.10 mg/dL   Calcium 9.7  8.4 - 09.8 mg/dL   Total Protein 7.0  6.0 - 8.3 g/dL   Albumin 4.2  3.5 - 5.2 g/dL   AST 18  0 - 37 U/L   ALT 16  0 - 35 U/L   Alkaline Phosphatase 83  39 - 117 U/L   Total Bilirubin 0.5  0.3 - 1.2 mg/dL   GFR calc non Af Amer 79 (*) >90 mL/min   GFR calc Af Amer >90  >90 mL/min  LIPASE, BLOOD      Result Value Range   Lipase 10 (*) 11 - 59 U/L  URINALYSIS W MICROSCOPIC + REFLEX CULTURE      Result Value Range   Color, Urine YELLOW  YELLOW   APPearance CLEAR  CLEAR   Specific Gravity, Urine 1.018  1.005 - 1.030   pH 6.0  5.0 - 8.0   Glucose, UA NEGATIVE  NEGATIVE mg/dL   Hgb urine dipstick NEGATIVE  NEGATIVE   Bilirubin Urine NEGATIVE  NEGATIVE   Ketones, ur 40 (*) NEGATIVE mg/dL   Protein, ur NEGATIVE  NEGATIVE mg/dL   Urobilinogen, UA 0.2  0.0 - 1.0 mg/dL   Nitrite NEGATIVE  NEGATIVE   Leukocytes, UA NEGATIVE  NEGATIVE   WBC, UA 0-2  <3 WBC/hpf   Squamous Epithelial / LPF RARE  RARE  URINE RAPID DRUG SCREEN (HOSP PERFORMED)      Result Value Range   Opiates POSITIVE (*) NONE DETECTED   Cocaine NONE DETECTED  NONE DETECTED   Benzodiazepines NONE DETECTED  NONE DETECTED    Amphetamines NONE DETECTED  NONE DETECTED   Tetrahydrocannabinol NONE DETECTED  NONE DETECTED   Barbiturates NONE DETECTED  NONE DETECTED   No results found.   No results found.  1. Abdominal pain     MDM  Pt with chronic right upper quadrant abdominal pain. Already seen by GI and surgery. Has endocopy [place] scheduled. Pt feeling better with pain medications. Abdomen no longer tender, no guarding. Pt agreeable to go home with close follow up with GI. Advised to call tomorrow to see if [place] can be rescheduled for earlier date.   Filed Vitals:   04/25/13 2229 04/25/13 2230 04/26/13 0003 04/26/13 0150  BP:  139/86 120/71 142/72  Pulse:  88 87 90  Temp:  98.3 F (36.8 C) 98.6 F (37 C)   TempSrc:  Oral Oral   Resp:  22 18 16   SpO2: 95%  95% 98%     Lottie Mussel, PA-C 04/26/13 2319

## 2013-04-25 NOTE — ED Notes (Signed)
[id number]<JY> Expected date:<BR> Expected time:<BR> Means of arrival:<BR> Comments:<BR> EMS/36 yo female from work-abdominal pain-severe-radiates to back-nausea/had chole last January

## 2013-04-25 NOTE — ED Notes (Signed)
Per EMS pt c/o severe abdominal pain, RUQ, that started about hour and half ago, reports nausea. Pt has hx of same, sts has endoscopy scheduled for July 24th to check "for liver stones".

## 2013-04-26 ENCOUNTER — Telehealth: Payer: Self-pay | Admitting: Gastroenterology

## 2013-04-26 LAB — RAPID URINE DRUG SCREEN, HOSP PERFORMED
Barbiturates: NOT DETECTED
Cocaine: NOT DETECTED
Tetrahydrocannabinol: NOT DETECTED

## 2013-04-26 LAB — URINALYSIS W MICROSCOPIC + REFLEX CULTURE
Ketones, ur: 40 mg/dL — AB
Leukocytes, UA: NEGATIVE
Nitrite: NEGATIVE
Protein, ur: NEGATIVE mg/dL

## 2013-04-26 MED ORDER — OXYCODONE-ACETAMINOPHEN 5-325 MG PO TABS: 1.0000 | ORAL_TABLET | ORAL | Status: DC | PRN

## 2013-04-26 MED ORDER — HYDROMORPHONE HCL PF 1 MG/ML IJ SOLN
1.0000 mg | Freq: Once | INTRAMUSCULAR | Status: AC
  Administered 2013-04-26: 1 mg via INTRAVENOUS
  Filled 2013-04-26: qty 1

## 2013-04-26 NOTE — ED Notes (Signed)
rx x 1 given for percocet- pt has a ride at bedside 

## 2013-04-26 NOTE — ED Notes (Signed)
PA at bedside.

## 2013-04-27 NOTE — ED Provider Notes (Signed)
Medical screening examination/treatment/procedure(s) were performed by non-physician practitioner and as supervising physician I was immediately available for consultation/collaboration.  Sunnie Nielsen, MD 04/27/13 [id number]

## 2013-04-27 NOTE — Telephone Encounter (Signed)
Left message on machine to call back  

## 2013-04-27 NOTE — Telephone Encounter (Signed)
Pt aware that the 05/12/13 appt is the soonest appt Dr Christella Hartigan has and she should call Dr Karilyn Cota and make him aware of her pain.  Pt agreed and thanked me for calling

## 2013-04-28 ENCOUNTER — Ambulatory Visit (INDEPENDENT_AMBULATORY_CARE_PROVIDER_SITE_OTHER): Payer: 59 | Admitting: Family Medicine

## 2013-04-28 ENCOUNTER — Encounter: Payer: Self-pay | Admitting: Family Medicine

## 2013-04-28 ENCOUNTER — Encounter (HOSPITAL_COMMUNITY): Payer: Self-pay

## 2013-04-28 ENCOUNTER — Telehealth: Payer: Self-pay | Admitting: Gastroenterology

## 2013-04-28 ENCOUNTER — Emergency Department (HOSPITAL_COMMUNITY)
Admission: EM | Admit: 2013-04-28 | Discharge: 2013-04-28 | Disposition: A | Discharge status: Home / Self Care | Payer: 59 | Attending: Emergency Medicine | Admitting: Emergency Medicine

## 2013-04-28 ENCOUNTER — Emergency Department (HOSPITAL_COMMUNITY): Payer: 59

## 2013-04-28 VITALS — BP 130/84 | HR 91 | Resp 16 | Ht 66.0 in | Wt 165.0 lb

## 2013-04-28 DIAGNOSIS — R109 Unspecified abdominal pain: Secondary | ICD-10-CM

## 2013-04-28 DIAGNOSIS — Z139 Encounter for screening, unspecified: Secondary | ICD-10-CM

## 2013-04-28 DIAGNOSIS — K219 Gastro-esophageal reflux disease without esophagitis: Secondary | ICD-10-CM | POA: Insufficient documentation

## 2013-04-28 DIAGNOSIS — Z872 Personal history of diseases of the skin and subcutaneous tissue: Secondary | ICD-10-CM | POA: Insufficient documentation

## 2013-04-28 DIAGNOSIS — Z8719 Personal history of other diseases of the digestive system: Secondary | ICD-10-CM | POA: Insufficient documentation

## 2013-04-28 DIAGNOSIS — Z8709 Personal history of other diseases of the respiratory system: Secondary | ICD-10-CM | POA: Insufficient documentation

## 2013-04-28 DIAGNOSIS — Z79899 Other long term (current) drug therapy: Secondary | ICD-10-CM | POA: Insufficient documentation

## 2013-04-28 DIAGNOSIS — Z862 Personal history of diseases of the blood and blood-forming organs and certain disorders involving the immune mechanism: Secondary | ICD-10-CM | POA: Insufficient documentation

## 2013-04-28 DIAGNOSIS — Z113 Encounter for screening for infections with a predominantly sexual mode of transmission: Secondary | ICD-10-CM

## 2013-04-28 DIAGNOSIS — R1011 Right upper quadrant pain: Secondary | ICD-10-CM

## 2013-04-28 DIAGNOSIS — E785 Hyperlipidemia, unspecified: Secondary | ICD-10-CM

## 2013-04-28 DIAGNOSIS — J45909 Unspecified asthma, uncomplicated: Secondary | ICD-10-CM

## 2013-04-28 DIAGNOSIS — Z87891 Personal history of nicotine dependence: Secondary | ICD-10-CM | POA: Insufficient documentation

## 2013-04-28 DIAGNOSIS — Z86718 Personal history of other venous thrombosis and embolism: Secondary | ICD-10-CM | POA: Insufficient documentation

## 2013-04-28 DIAGNOSIS — R5381 Other malaise: Secondary | ICD-10-CM

## 2013-04-28 DIAGNOSIS — R110 Nausea: Secondary | ICD-10-CM | POA: Insufficient documentation

## 2013-04-28 DIAGNOSIS — Z8639 Personal history of other endocrine, nutritional and metabolic disease: Secondary | ICD-10-CM | POA: Insufficient documentation

## 2013-04-28 LAB — COMPREHENSIVE METABOLIC PANEL
ALT: 15 U/L (ref 0–35)
AST: 16 U/L (ref 0–37)
Albumin: 3.8 g/dL (ref 3.5–5.2)
Alkaline Phosphatase: 85 U/L (ref 39–117)
BUN: 10 mg/dL (ref 6–23)
Chloride: 101 mEq/L (ref 96–112)
Potassium: 3.4 mEq/L — ABNORMAL LOW (ref 3.5–5.1)
Sodium: 135 mEq/L (ref 135–145)
Total Bilirubin: 0.3 mg/dL (ref 0.3–1.2)

## 2013-04-28 LAB — URINALYSIS, ROUTINE W REFLEX MICROSCOPIC
Bilirubin Urine: NEGATIVE
Glucose, UA: NEGATIVE mg/dL
Hgb urine dipstick: NEGATIVE
Ketones, ur: NEGATIVE mg/dL
pH: 5.5 (ref 5.0–8.0)

## 2013-04-28 LAB — CBC WITH DIFFERENTIAL/PLATELET
Basophils Relative: 1 % (ref 0–1)
Hemoglobin: 11.9 g/dL — ABNORMAL LOW (ref 12.0–15.0)
MCHC: 36.4 g/dL — ABNORMAL HIGH (ref 30.0–36.0)
Monocytes Relative: 9 % (ref 3–12)
Neutro Abs: 3.7 10*3/uL (ref 1.7–7.7)
Neutrophils Relative %: 57 % (ref 43–77)
RBC: 3.9 MIL/uL (ref 3.87–5.11)

## 2013-04-28 MED ORDER — OXYCODONE-ACETAMINOPHEN 7.5-325 MG PO TABS: 1.0000 | ORAL_TABLET | ORAL | Status: DC | PRN

## 2013-04-28 MED ORDER — IOHEXOL 300 MG/ML  SOLN
50.0000 mL | Freq: Once | INTRAMUSCULAR | Status: AC | PRN
  Administered 2013-04-28: 50 mL via ORAL

## 2013-04-28 MED ORDER — IOHEXOL 300 MG/ML  SOLN
100.0000 mL | Freq: Once | INTRAMUSCULAR | Status: AC | PRN
  Administered 2013-04-28: 100 mL via INTRAVENOUS

## 2013-04-28 MED ORDER — HYDROMORPHONE HCL PF 1 MG/ML IJ SOLN: 1.0000 mg | Freq: Once | INTRAMUSCULAR | Status: AC

## 2013-04-28 MED ORDER — HYDROMORPHONE HCL PF 1 MG/ML IJ SOLN
INTRAMUSCULAR | Status: AC
  Filled 2013-04-28: qty 1

## 2013-04-28 MED ORDER — HYDROMORPHONE HCL PF 1 MG/ML IJ SOLN
1.0000 mg | Freq: Once | INTRAMUSCULAR | Status: AC
  Administered 2013-04-28: 1 mg via INTRAVENOUS

## 2013-04-28 MED ORDER — SODIUM CHLORIDE 0.9 % IV SOLN
1000.0000 mL | INTRAVENOUS | Status: DC
  Administered 2013-04-28: 1000 mL via INTRAVENOUS

## 2013-04-28 MED ORDER — HYDROMORPHONE HCL PF 1 MG/ML IJ SOLN
INTRAMUSCULAR | Status: AC
  Administered 2013-04-28: 1 mg via INTRAVENOUS
  Filled 2013-04-28: qty 1

## 2013-04-28 MED ORDER — HYDROMORPHONE HCL PF 1 MG/ML IJ SOLN
1.0000 mg | Freq: Once | INTRAMUSCULAR | Status: AC
  Administered 2013-04-28: 1 mg via INTRAVENOUS
  Filled 2013-04-28: qty 1

## 2013-04-28 MED ORDER — SODIUM CHLORIDE 0.9 % IV SOLN
1000.0000 mL | Freq: Once | INTRAVENOUS | Status: AC
  Administered 2013-04-28: 1000 mL via INTRAVENOUS

## 2013-04-28 MED ORDER — ONDANSETRON HCL 4 MG/2ML IJ SOLN
4.0000 mg | Freq: Once | INTRAMUSCULAR | Status: AC
  Administered 2013-04-28: 4 mg via INTRAVENOUS
  Filled 2013-04-28: qty 2

## 2013-04-28 NOTE — Progress Notes (Signed)
  Subjective:    Patient ID: Jodi [name], female    DOB: 1976/03/11, 37 y.o.   MRN: [id number]  HPI  Multiple ED visit since end May for severe uncontrolled recurrent Ed visits. Missing work often, current pain is 5, scheduled for procedure end July but states unable to wait Concerned about her job, has used up all vacation time, will complete an FMLA When pain flares, it is past a 10, also has nausea with the pain, no consistent change in stool, no visible rectal blood or hematemesis  Review of Systems See HPI Denies recent fever or chills. Denies sinus pressure, nasal congestion, ear pain or sore throat. Denies chest congestion, productive cough or wheezing. Denies chest pains, palpitations and leg swelling  Denies dysuria, frequency, hesitancy or incontinence. Denies joint pain, swelling and limitation in mobility. Denies headaches, seizures, numbness, or tingling. Denies depression, anxiety or insomnia. Denies skin break down or rash.        Objective:   Physical Exam  Patient alert and oriented and in no cardiopulmonary distress.  HEENT: No facial asymmetry, EOMI, no sinus tenderness,  oropharynx pink and moist.  Neck supple no adenopathy.  Chest: Clear to auscultation bilaterally.  CVS: S1, S2 no murmurs, no S3.  ABD: Soft non tender,. No guarding or rebound. BS normal Ext: No edema  MS: Adequate ROM spine, shoulders, hips and knees.  Skin: Intact, no ulcerations or rash noted.  Psych: Good eye contact, normal affect. Memory intact not anxious or depressed appearing.  CNS: CN 2-12 intact, power, tone and sensation normal throughout.       Assessment & Plan:

## 2013-04-28 NOTE — ED Provider Notes (Signed)
History    CSN: [id number] Arrival date & time 04/28/13  0302  None    Chief Complaint  Patient presents with  . Flank Pain  . Abdominal Pain   (Consider location/radiation/quality/duration/timing/severity/associated sxs/prior Treatment) The history is provided by the patient.   37 year old female comes in because of severe right upper abdominal pain which has been present for about 6 weeks but got significantly worse about one hour ago. Pain is always present as a dull ache him periodically gets severe. She currently rates pain a 10/10. Nothing makes it better nothing makes it worse. There is associated nausea without vomiting. There's no constipation or diarrhea. She denies fever, chills, sweats. She is scheduled to have an endoscopy done in 2 weeks. Of note, she is 6 months post cholecystectomy. Past Medical History  Diagnosis Date  . Asthma   . DVT (deep venous thrombosis) 10 yrs ago  . Asthma   . Allergic rhinitis   . Hyperlipidemia   . Shortness of breath     SOB even without exertion at times  . GERD (gastroesophageal reflux disease)   . IBS (irritable bowel syndrome)   . 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 Anemia   . Eczema    Past Surgical History  Procedure Laterality Date  . Blood clot removed from lower abdomen  2000  . Cyst removed from ovary and fluid removed from tubes  2000  . Partial hysterectomy  2009  . Esophagogastroduodenoscopy  10/22/2012    Procedure: ESOPHAGOGASTRODUODENOSCOPY (EGD);  Surgeon: Malissa Hippo, MD;  [facility];  Service: Endoscopy;  Laterality: N/A;  100  . Colonoscopy  11/04/2012    Procedure: COLONOSCOPY;  Surgeon: Malissa Hippo, MD;  Location: [facility];  Service: Endoscopy;  Laterality: N/A;  915  . Abdominal hysterectomy  4-5 yrs ago  . Tubal ligation  1999  . Cholecystectomy  11/12/2012    Procedure: LAPAROSCOPIC CHOLECYSTECTOMY;  Surgeon: Dalia Heading, MD;  Location: [facility];  Service: General;   Laterality: N/A;   Family History  Problem Relation Age of Onset  . Asthma Mother   . Hypertension Mother   . Bronchiolitis Mother   . Hypertension Father   . Hyperlipidemia Father     recurrent rectum polyps   . Diabetes Sister   . Lupus Sister   . Depression Sister    History  Substance Use Topics  . Smoking status: Former Smoker -- 0.25 packs/day for 10 years    Types: Cigarettes    Quit date: 10/13/2008  . Smokeless tobacco: Never Used  . Alcohol Use: No   OB History   Grav Para Term Preterm Abortions TAB SAB Ect Mult Living                 Review of Systems  All other systems reviewed and are negative.    Allergies  Compazine  Home Medications   Current Outpatient Rx  Name  Route  Sig  Dispense  Refill  . albuterol (PROVENTIL HFA;VENTOLIN HFA) 108 (90 BASE) MCG/ACT inhaler   Inhalation   Inhale 2 puffs into the lungs every 4 (four) hours as needed for wheezing or shortness of breath.         Marland Kitchen albuterol (PROVENTIL) (2.5 MG/3ML) 0.083% nebulizer solution   Nebulization   Take 2.5 mg by nebulization every 4 (four) hours as needed for wheezing or shortness of breath.         Marland Kitchen  dicyclomine (BENTYL) 10 MG capsule   Oral   Take 1 capsule (10 mg total) by mouth 3 (three) times daily before meals.   90 capsule   2   . fluticasone (FLONASE) 50 MCG/ACT nasal spray   Nasal   Place 2 sprays into the nose daily.   16 g   4   . fluticasone-salmeterol (ADVAIR HFA) 230-21 MCG/ACT inhaler   Inhalation   Inhale 2 puffs into the lungs 2 (two) times daily.   1 Inhaler   12   . HYDROcodone-acetaminophen (NORCO) 7.5-325 MG per tablet   Oral   Take 1 tablet by mouth every 6 (six) hours as needed for pain.         Marland Kitchen levocetirizine (XYZAL) 5 MG tablet   Oral   Take 1 tablet (5 mg total) by mouth every evening.   30 tablet   4   . montelukast (SINGULAIR) 10 MG tablet   Oral   Take 10 mg by mouth every morning.         . naproxen (NAPROSYN) 500 MG  tablet   Oral   Take 500 mg by mouth 2 (two) times daily with a meal.         . omeprazole (PRILOSEC) 20 MG capsule   Oral   Take 1 capsule (20 mg total) by mouth 2 (two) times daily.   60 capsule   3   . ondansetron (ZOFRAN ODT) 8 MG disintegrating tablet   Oral   Take 1 tablet (8 mg total) by mouth every 8 (eight) hours as needed for nausea.   20 tablet   0   . oxyCODONE-acetaminophen (PERCOCET) 5-325 MG per tablet   Oral   Take 1 tablet by mouth every 4 (four) hours as needed for pain.   20 tablet   0    BP 141/116  Pulse 102  Temp(Src) 99 F (37.2 C) (Oral)  Resp 20  Ht 5\' 6"  (1.676 m)  Wt 165 lb (74.844 kg)  BMI 26.64 kg/m2  SpO2 97%  LMP 05/17/2012 Physical Exam  Nursing note and vitals reviewed.  37 year old female, who is doubled over in pain. Vital signs are significant for hypertension with blood pressure 141/116, tachycardia with heart rate of 102. Oxygen saturation is 97%, which is normal. Head is normocephalic and atraumatic. PERRLA, EOMI. Oropharynx is clear. Neck is nontender and supple without adenopathy or JVD. Back is nontender and there is no CVA tenderness. Lungs are clear without rales, wheezes, or rhonchi. Chest is nontender. Heart has regular rate and rhythm without murmur. Abdomen is soft, flat, with marked right upper quadrant tenderness. There is no rebound or guarding. There are no masses or hepatosplenomegaly and peristalsis is hypoactive. Extremities have no cyanosis or edema, full range of motion is present. Skin is warm and dry without rash. Neurologic: Mental status is normal, cranial nerves are intact, there are no motor or sensory deficits.   ED Course  Procedures (including critical care time) Results for orders placed during the hospital encounter of 04/28/13  CBC WITH DIFFERENTIAL      Result Value Range   WBC 6.4  4.0 - 10.5 K/uL   RBC 3.90  3.87 - 5.11 MIL/uL   Hemoglobin 11.9 (*) 12.0 - 15.0 g/dL   HCT 29.5 (*) 62.1 -  46.0 %   MCV 83.8  78.0 - 100.0 fL   MCH 30.5  26.0 - 34.0 pg   MCHC 36.4 (*) 30.0 - 36.0  g/dL   RDW 11.9  14.7 - 82.9 %   Platelets 312  150 - 400 K/uL   Neutrophils Relative % 57  43 - 77 %   Neutro Abs 3.7  1.7 - 7.7 K/uL   Lymphocytes Relative 32  12 - 46 %   Lymphs Abs 2.0  0.7 - 4.0 K/uL   Monocytes Relative 9  3 - 12 %   Monocytes Absolute 0.6  0.1 - 1.0 K/uL   Eosinophils Relative 2  0 - 5 %   Eosinophils Absolute 0.1  0.0 - 0.7 K/uL   Basophils Relative 1  0 - 1 %   Basophils Absolute 0.0  0.0 - 0.1 K/uL  COMPREHENSIVE METABOLIC PANEL      Result Value Range   Sodium 135  135 - 145 mEq/L   Potassium 3.4 (*) 3.5 - 5.1 mEq/L   Chloride 101  96 - 112 mEq/L   CO2 27  19 - 32 mEq/L   Glucose, Bld 120 (*) 70 - 99 mg/dL   BUN 10  6 - 23 mg/dL   Creatinine, Ser 5.62  0.50 - 1.10 mg/dL   Calcium 9.4  8.4 - 13.0 mg/dL   Total Protein 6.7  6.0 - 8.3 g/dL   Albumin 3.8  3.5 - 5.2 g/dL   AST 16  0 - 37 U/L   ALT 15  0 - 35 U/L   Alkaline Phosphatase 85  39 - 117 U/L   Total Bilirubin 0.3  0.3 - 1.2 mg/dL   GFR calc non Af Amer 75 (*) >90 mL/min   GFR calc Af Amer 87 (*) >90 mL/min  LIPASE, BLOOD      Result Value Range   Lipase 11  11 - 59 U/L  URINALYSIS, ROUTINE W REFLEX MICROSCOPIC      Result Value Range   Color, Urine YELLOW  YELLOW   APPearance CLEAR  CLEAR   Specific Gravity, Urine 1.015  1.005 - 1.030   pH 5.5  5.0 - 8.0   Glucose, UA NEGATIVE  NEGATIVE mg/dL   Hgb urine dipstick NEGATIVE  NEGATIVE   Bilirubin Urine NEGATIVE  NEGATIVE   Ketones, ur NEGATIVE  NEGATIVE mg/dL   Protein, ur NEGATIVE  NEGATIVE mg/dL   Urobilinogen, UA 0.2  0.0 - 1.0 mg/dL   Nitrite NEGATIVE  NEGATIVE   Leukocytes, UA NEGATIVE  NEGATIVE   Ct Abdomen Pelvis W Contrast  04/28/2013   *RADIOLOGY REPORT*  Clinical Data: Right upper quadrant abdominal pain and nausea.  CT ABDOMEN AND PELVIS WITH CONTRAST  Technique:  Multidetector CT imaging of the abdomen and pelvis was performed  following the standard protocol during bolus administration of intravenous contrast.  Contrast: 100 mL of Omnipaque 300 IV contrast  Comparison: Abdominal radiograph performed 03/12/2013, and CT of the abdomen and pelvis performed 10/19/2012  Findings: The visualized lung bases are clear.  Scattered hepatic cysts are perhaps slightly more prominent than on the prior study, measuring up to 1.0 cm in size.  The liver is otherwise unremarkable in appearance.  The spleen is within normal limits.  The patient is status post cholecystectomy, with clips noted along the gallbladder fossa.  The pancreas and adrenal glands are unremarkable.  A tiny 0.4 cm cyst is noted at the upper pole of the right kidney. The kidneys are otherwise unremarkable in appearance.  There is no evidence of hydronephrosis.  No renal or ureteral stones are seen. No perinephric stranding is appreciated.  No  free fluid is identified.  The small bowel is unremarkable in appearance.  The stomach is within normal limits.  No acute vascular abnormalities are seen.  The appendix remains normal in caliber and contains minimal air, without evidence for appendicitis.  Minimal diverticulosis is noted along the proximal descending colon, without evidence of diverticulitis.  The bladder is mildly distended and grossly unremarkable in appearance.  The the patient is status post partial hysterectomy; the vaginal cuff is grossly unremarkable in appearance.  Scattered small left ovarian cysts are unchanged in appearance.  No suspicious adnexal masses are seen.  Trace free fluid within the pelvis is likely physiologic in nature.  No inguinal lymphadenopathy is seen.  No acute osseous abnormalities are identified.  IMPRESSION:  1.  No acute abnormality seen to explain the patient's symptoms. Status post cholecystectomy; no evidence of retained stones. 2.  Likely small hepatic cysts and right renal cyst again seen. 3.  Minimal diverticulosis along the proximal  descending colon, without evidence of diverticulitis.   Original Report Authenticated By: Tonia Ghent, M.D.   Images viewed by me.  1. Abdominal pain     MDM  Right upper quadrant pain of uncertain cause. Old records are reviewed and she has had several ED visits with similar pain. Laboratory workup has always been normal she had a normal abdominal ultrasound on May 31. She did have a CT scan done in January but I would've been prior to her cholecystectomy. She'll be given IV fluids and IV hydromorphone and IV ondansetron. CT scan will be obtained.  She had reasonably good pain relief with hydromorphone but would require several additional doses. CT scan and laboratory workup were unremarkable. She was only scheduled for upper endoscopy and she is advised to keep that appointment. I have discussed with her [facility] if diagnosis is not able to be made here.  Dione Booze, MD 04/28/13 [phone]

## 2013-04-28 NOTE — Patient Instructions (Addendum)
F/u in end September, call if you need me before  I will provide you with an FMLA form to cover the next 2 months, till end September  I have contacted Dr Karilyn Cota , and will let you know if he is able to expedite a sooner appt for further testing once we discuss this

## 2013-04-28 NOTE — Assessment & Plan Note (Signed)
Controlled, no change in medication  

## 2013-04-28 NOTE — Assessment & Plan Note (Signed)
Recurrent disabling abdominal pain, neeeds FMLA coverage until sorted out, will return with form I spoke directly with Dr Karilyn Cota who will also try to get a sooner appt for pt to have ERCP done, current is for 2 weeks time, will call her with the info

## 2013-04-28 NOTE — ED Notes (Signed)
Pain in upper right flank and abdomen per pt. Sharp. "They think I have liver stones" per pt. I go for an endoscopic ultrasound on July 31 st. I see Dr. Lodema Hong and have seen Dr. Karilyn Cota and he referred me to [facility].

## 2013-04-28 NOTE — ED Notes (Signed)
Patient walk to restroom without assist, tolerated well.

## 2013-04-29 NOTE — Telephone Encounter (Signed)
Left message on machine to call back  

## 2013-04-29 NOTE — Telephone Encounter (Signed)
I spoke with Jodi [name] and informed her that Dr Christella Hartigan has no other available appts she thanked me for calling

## 2013-05-01 ENCOUNTER — Encounter (HOSPITAL_COMMUNITY): Payer: Self-pay | Admitting: Emergency Medicine

## 2013-05-01 ENCOUNTER — Emergency Department (HOSPITAL_COMMUNITY)
Admission: EM | Admit: 2013-05-01 | Discharge: 2013-05-01 | Disposition: A | Discharge status: Home / Self Care | Payer: 59 | Attending: Emergency Medicine | Admitting: Emergency Medicine

## 2013-05-01 DIAGNOSIS — IMO0002 Reserved for concepts with insufficient information to code with codable children: Secondary | ICD-10-CM | POA: Insufficient documentation

## 2013-05-01 DIAGNOSIS — Z9089 Acquired absence of other organs: Secondary | ICD-10-CM | POA: Insufficient documentation

## 2013-05-01 DIAGNOSIS — Z90710 Acquired absence of both cervix and uterus: Secondary | ICD-10-CM | POA: Insufficient documentation

## 2013-05-01 DIAGNOSIS — J45909 Unspecified asthma, uncomplicated: Secondary | ICD-10-CM | POA: Insufficient documentation

## 2013-05-01 DIAGNOSIS — Z862 Personal history of diseases of the blood and blood-forming organs and certain disorders involving the immune mechanism: Secondary | ICD-10-CM | POA: Insufficient documentation

## 2013-05-01 DIAGNOSIS — G8918 Other acute postprocedural pain: Secondary | ICD-10-CM | POA: Insufficient documentation

## 2013-05-01 DIAGNOSIS — R112 Nausea with vomiting, unspecified: Secondary | ICD-10-CM | POA: Insufficient documentation

## 2013-05-01 DIAGNOSIS — Z872 Personal history of diseases of the skin and subcutaneous tissue: Secondary | ICD-10-CM | POA: Insufficient documentation

## 2013-05-01 DIAGNOSIS — K219 Gastro-esophageal reflux disease without esophagitis: Secondary | ICD-10-CM | POA: Insufficient documentation

## 2013-05-01 DIAGNOSIS — Z8709 Personal history of other diseases of the respiratory system: Secondary | ICD-10-CM | POA: Insufficient documentation

## 2013-05-01 DIAGNOSIS — Z8639 Personal history of other endocrine, nutritional and metabolic disease: Secondary | ICD-10-CM | POA: Insufficient documentation

## 2013-05-01 DIAGNOSIS — Z791 Long term (current) use of non-steroidal anti-inflammatories (NSAID): Secondary | ICD-10-CM | POA: Insufficient documentation

## 2013-05-01 DIAGNOSIS — Z87891 Personal history of nicotine dependence: Secondary | ICD-10-CM | POA: Insufficient documentation

## 2013-05-01 DIAGNOSIS — Z79899 Other long term (current) drug therapy: Secondary | ICD-10-CM | POA: Insufficient documentation

## 2013-05-01 DIAGNOSIS — R1011 Right upper quadrant pain: Secondary | ICD-10-CM | POA: Insufficient documentation

## 2013-05-01 DIAGNOSIS — K589 Irritable bowel syndrome without diarrhea: Secondary | ICD-10-CM | POA: Insufficient documentation

## 2013-05-01 DIAGNOSIS — Z86718 Personal history of other venous thrombosis and embolism: Secondary | ICD-10-CM | POA: Insufficient documentation

## 2013-05-01 LAB — COMPREHENSIVE METABOLIC PANEL
ALT: 14 U/L (ref 0–35)
Albumin: 3.9 g/dL (ref 3.5–5.2)
Alkaline Phosphatase: 76 U/L (ref 39–117)
Chloride: 98 mEq/L (ref 96–112)
GFR calc Af Amer: >90 mL/min (ref >90)
Glucose, Bld: 101 mg/dL — ABNORMAL HIGH (ref 70–99)
Potassium: 3.3 mEq/L — ABNORMAL LOW (ref 3.5–5.1)
Sodium: 133 mEq/L — ABNORMAL LOW (ref 135–145)
Total Bilirubin: 0.3 mg/dL (ref 0.3–1.2)
Total Protein: 6.7 g/dL (ref 6.0–8.3)

## 2013-05-01 LAB — CBC WITH DIFFERENTIAL/PLATELET
Eosinophils Absolute: 0.1 10*3/uL (ref 0.0–0.7)
Lymphs Abs: 2.1 10*3/uL (ref 0.7–4.0)
MCH: 30.8 pg (ref 26.0–34.0)
Neutro Abs: 3 10*3/uL (ref 1.7–7.7)
Neutrophils Relative %: 51 % (ref 43–77)
Platelets: 291 10*3/uL (ref 150–400)
RBC: 3.73 MIL/uL — ABNORMAL LOW (ref 3.87–5.11)
WBC: 5.8 10*3/uL (ref 4.0–10.5)

## 2013-05-01 MED ORDER — HYDROMORPHONE HCL PF 1 MG/ML IJ SOLN
1.0000 mg | Freq: Once | INTRAMUSCULAR | Status: AC
  Administered 2013-05-01: 1 mg via INTRAVENOUS
  Filled 2013-05-01: qty 1

## 2013-05-01 MED ORDER — HYDROMORPHONE HCL 4 MG PO TABS: 2.0000 mg | ORAL_TABLET | ORAL | Status: DC | PRN

## 2013-05-01 MED ORDER — ONDANSETRON HCL 4 MG/2ML IJ SOLN
4.0000 mg | Freq: Once | INTRAMUSCULAR | Status: AC
  Administered 2013-05-01: 4 mg via INTRAVENOUS
  Filled 2013-05-01: qty 2

## 2013-05-01 MED ORDER — FENTANYL CITRATE 0.05 MG/ML IJ SOLN
INTRAMUSCULAR | Status: AC
  Administered 2013-05-01: 50 ug
  Filled 2013-05-01: qty 2

## 2013-05-01 MED ORDER — ONDANSETRON HCL 4 MG/2ML IJ SOLN
INTRAMUSCULAR | Status: AC
  Administered 2013-05-01: 4 mg
  Filled 2013-05-01: qty 2

## 2013-05-01 MED ORDER — SODIUM CHLORIDE 0.9 % IV SOLN
1000.0000 mL | Freq: Once | INTRAVENOUS | Status: AC
  Administered 2013-05-01: 1000 mL via INTRAVENOUS

## 2013-05-01 MED ORDER — ONDANSETRON HCL 4 MG PO TABS: 4.0000 mg | ORAL_TABLET | Freq: Four times a day (QID) | ORAL | Status: DC | PRN

## 2013-05-01 MED ORDER — SODIUM CHLORIDE 0.9 % IV SOLN
1000.0000 mL | INTRAVENOUS | Status: DC
  Administered 2013-05-01: 1000 mL via INTRAVENOUS

## 2013-05-01 NOTE — ED Provider Notes (Signed)
History    CSN: [id number] Arrival date & time 05/01/13  0251  First MD Initiated Contact with Patient 05/01/13 [id number]     Chief Complaint  Patient presents with  . Abdominal Pain  . Nausea  . Emesis   (Consider location/radiation/quality/duration/timing/severity/associated sxs/prior Treatment) Patient is a 37 y.o. female presenting with abdominal pain and vomiting. The history is provided by the patient.  Abdominal Pain Associated symptoms include abdominal pain.  Emesis Associated symptoms: abdominal pain   She is 6 months post cholecystectomy and has been having ongoing problems with right upper quadrant pain with radiation to back. She has had several ED visits with the most recent one being 3 days ago. She is scheduled to have an endoscopic ultrasound in 11 days, and she is working with her gastroenterologist to have it moved up. She is continuing to have right upper quadrant pain which got worse tonight. Pain radiates through to the back and is severe and she rates it at 10/10. There is associated nausea and vomiting. She denies fever or chills. Nothing makes it better nothing makes it worse. She's been taking oxycodone-acetaminophen 7.5-325 with no relief. Past Medical History  Diagnosis Date  . Asthma   . DVT (deep venous thrombosis) 10 yrs ago  . Asthma   . Allergic rhinitis   . Hyperlipidemia   . Shortness of breath     SOB even without exertion at times  . GERD (gastroesophageal reflux disease)   . IBS (irritable bowel syndrome)   . 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 Anemia   . Eczema    Past Surgical History  Procedure Laterality Date  . Blood clot removed from lower abdomen  2000  . Cyst removed from ovary and fluid removed from tubes  2000  . Partial hysterectomy  2009  . Esophagogastroduodenoscopy  10/22/2012    Procedure: ESOPHAGOGASTRODUODENOSCOPY (EGD);  Surgeon: Malissa Hippo, MD;  [facility];  Service: Endoscopy;  Laterality:  N/A;  100  . Colonoscopy  11/04/2012    Procedure: COLONOSCOPY;  Surgeon: Malissa Hippo, MD;  [facility];  Service: Endoscopy;  Laterality: N/A;  915  . Abdominal hysterectomy  4-5 yrs ago  . Tubal ligation  1999  . Cholecystectomy  11/12/2012    Procedure: LAPAROSCOPIC CHOLECYSTECTOMY;  Surgeon: Dalia Heading, MD;  Location: [facility];  Service: General;  Laterality: N/A;   Family History  Problem Relation Age of Onset  . Asthma Mother   . Hypertension Mother   . Bronchiolitis Mother   . Hypertension Father   . Hyperlipidemia Father     recurrent rectum polyps   . Diabetes Sister   . Lupus Sister   . Depression Sister    History  Substance Use Topics  . Smoking status: Former Smoker -- 0.25 packs/day for 10 years    Types: Cigarettes    Quit date: 10/13/2008  . Smokeless tobacco: Never Used  . Alcohol Use: No   OB History   Grav Para Term Preterm Abortions TAB SAB Ect Mult Living                 Review of Systems  Gastrointestinal: Positive for vomiting and abdominal pain.  All other systems reviewed and are negative.    Allergies  Compazine  Home Medications   Current Outpatient Rx  Name  Route  Sig  Dispense  Refill  . albuterol (PROVENTIL HFA;VENTOLIN HFA) 108 (90 BASE)  MCG/ACT inhaler   Inhalation   Inhale 2 puffs into the lungs every 4 (four) hours as needed for wheezing or shortness of breath.         Marland Kitchen albuterol (PROVENTIL) (2.5 MG/3ML) 0.083% nebulizer solution   Nebulization   Take 2.5 mg by nebulization every 4 (four) hours as needed for wheezing or shortness of breath.         . dicyclomine (BENTYL) 10 MG capsule   Oral   Take 1 capsule (10 mg total) by mouth 3 (three) times daily before meals.   90 capsule   2   . fluticasone (FLONASE) 50 MCG/ACT nasal spray   Nasal   Place 2 sprays into the nose daily.   16 g   4   . fluticasone-salmeterol (ADVAIR HFA) 230-21 MCG/ACT inhaler   Inhalation   Inhale 2 puffs into the lungs 2  (two) times daily.   1 Inhaler   12   . HYDROcodone-acetaminophen (NORCO) 7.5-325 MG per tablet   Oral   Take 1 tablet by mouth every 6 (six) hours as needed for pain.         Marland Kitchen levocetirizine (XYZAL) 5 MG tablet   Oral   Take 1 tablet (5 mg total) by mouth every evening.   30 tablet   4   . montelukast (SINGULAIR) 10 MG tablet   Oral   Take 10 mg by mouth every morning.         . naproxen (NAPROSYN) 500 MG tablet   Oral   Take 500 mg by mouth 2 (two) times daily with a meal.         . omeprazole (PRILOSEC) 20 MG capsule   Oral   Take 1 capsule (20 mg total) by mouth 2 (two) times daily.   60 capsule   3   . ondansetron (ZOFRAN ODT) 8 MG disintegrating tablet   Oral   Take 1 tablet (8 mg total) by mouth every 8 (eight) hours as needed for nausea.   20 tablet   0   . oxyCODONE-acetaminophen (PERCOCET) 5-325 MG per tablet   Oral   Take 1 tablet by mouth every 4 (four) hours as needed for pain.   20 tablet   0   . oxyCODONE-acetaminophen (PERCOCET) 7.5-325 MG per tablet   Oral   Take 1 tablet by mouth every 4 (four) hours as needed for pain.   30 tablet   0    BP 154/70  Pulse 103  Temp(Src) 98.3 F (36.8 C) (Oral)  Resp 24  Ht 5\' 6"  (1.676 m)  Wt 165 lb (74.844 kg)  BMI 26.64 kg/m2  SpO2 97%  LMP 05/17/2012 Physical Exam  Nursing note and vitals reviewed.  36 year old female, in obvious pain, but in no acute distress. Vital signs are significant for hypertension with blood pressure 134/70, tachycardia with heart rate 103, and tachypnea with respiratory rate of 24. Oxygen saturation is 97%, which is normal. Head is normocephalic and atraumatic. PERRLA, EOMI. Oropharynx is clear. Neck is nontender and supple without adenopathy or JVD. Back is nontender and there is no CVA tenderness. Lungs are clear without rales, wheezes, or rhonchi. Chest is nontender. Heart has regular rate and rhythm without murmur. Abdomen is soft, flat, with moderate to  severe right upper quadrant tenderness. There is no rebound or guarding. There are no masses or hepatosplenomegaly and peristalsis is hypoactive. Extremities have no cyanosis or edema, full range of motion is present. Skin  is warm and dry without rash. Neurologic: Mental status is normal, cranial nerves are intact, there are no motor or sensory deficits.  ED Course  Procedures (including critical care time) Results for orders placed during the hospital encounter of 05/01/13  CBC WITH DIFFERENTIAL      Result Value Range   WBC 5.8  4.0 - 10.5 K/uL   RBC 3.73 (*) 3.87 - 5.11 MIL/uL   Hemoglobin 11.5 (*) 12.0 - 15.0 g/dL   HCT 91.4 (*) 78.2 - 95.6 %   MCV 86.1  78.0 - 100.0 fL   MCH 30.8  26.0 - 34.0 pg   MCHC 35.8  30.0 - 36.0 g/dL   RDW 21.3  08.6 - 57.8 %   Platelets 291  150 - 400 K/uL   Neutrophils Relative % 51  43 - 77 %   Neutro Abs 3.0  1.7 - 7.7 K/uL   Lymphocytes Relative 37  12 - 46 %   Lymphs Abs 2.1  0.7 - 4.0 K/uL   Monocytes Relative 9  3 - 12 %   Monocytes Absolute 0.5  0.1 - 1.0 K/uL   Eosinophils Relative 2  0 - 5 %   Eosinophils Absolute 0.1  0.0 - 0.7 K/uL   Basophils Relative 1  0 - 1 %   Basophils Absolute 0.0  0.0 - 0.1 K/uL  COMPREHENSIVE METABOLIC PANEL      Result Value Range   Sodium 133 (*) 135 - 145 mEq/L   Potassium 3.3 (*) 3.5 - 5.1 mEq/L   Chloride 98  96 - 112 mEq/L   CO2 27  19 - 32 mEq/L   Glucose, Bld 101 (*) 70 - 99 mg/dL   BUN 12  6 - 23 mg/dL   Creatinine, Ser 4.69  0.50 - 1.10 mg/dL   Calcium 9.0  8.4 - 62.9 mg/dL   Total Protein 6.7  6.0 - 8.3 g/dL   Albumin 3.9  3.5 - 5.2 g/dL   AST 16  0 - 37 U/L   ALT 14  0 - 35 U/L   Alkaline Phosphatase 76  39 - 117 U/L   Total Bilirubin 0.3  0.3 - 1.2 mg/dL   GFR calc non Af Amer 88 (*) >90 mL/min   GFR calc Af Amer >90  >90 mL/min  LIPASE, BLOOD      Result Value Range   Lipase 10 (*) 11 - 59 U/L    1. Abdominal pain, right upper quadrant     MDM  Persistent right upper quadrant  pain following cholecystectomy. Old records are reviewed and she had a negative CT of her abdomen and pelvis 3 days ago. Metabolic panel will be rechecked at but anticipate treatment today will be completely asymptomatic. She has been given 2 doses of fentanyl thus far with no relief. She is given hydromorphone. She has been given ondansetron for nausea with moderate relief.  She got good pain relief with hydromorphone although pain recurred and she did require a second dose. At this point, she will need to have her endoscopic ultrasound done but just needs to keep the pain under control until then. She's discharged with a prescription for hydromorphone tablets 2 to take 1-2 tablets at a time every 4 hours as needed for pain. She is to followup with her PCP and with her gastroenterologist.  Dione Booze, MD 05/01/13 0800

## 2013-05-01 NOTE — ED Notes (Signed)
Pt c/o abdominal pain with N/V. States "it's like I'm having a gallbladder attack but I don't have a gallbladder."

## 2013-05-02 ENCOUNTER — Telehealth: Payer: Self-pay | Admitting: Family Medicine

## 2013-05-02 NOTE — Telephone Encounter (Signed)
Spoke with pt let her knwo she would have to wait till next week for the procedure, she stated she will likely  Go to [facility]. i advised I was unsure whether she would get attention any sooner there, but she should do what was in her best interest as far she can see

## 2013-05-02 NOTE — Telephone Encounter (Signed)
Spoke with Jodi [name] still awaiting call from Dr Karilyn Cota, will attempt to spk directly with him tooday

## 2013-05-03 ENCOUNTER — Telehealth: Payer: Self-pay | Admitting: Family Medicine

## 2013-05-03 NOTE — Telephone Encounter (Signed)
I understand the procedure is being done thios [facility] in [place], so I am uncertain as to what exactly she is wanting, Dr Page Spiro called this morning, pls verify that she still also wants to go to [facility], and is she keeping her Friday appt

## 2013-05-04 NOTE — Telephone Encounter (Signed)
Left message to call back  

## 2013-05-04 NOTE — Telephone Encounter (Signed)
Patient is going to [facility] Friday and have her procedure done she is fine with this everything is good now

## 2013-05-04 NOTE — Telephone Encounter (Signed)
Noted, thanks!

## 2013-05-09 ENCOUNTER — Telehealth (INDEPENDENT_AMBULATORY_CARE_PROVIDER_SITE_OTHER): Payer: Self-pay | Admitting: *Deleted

## 2013-05-09 NOTE — Telephone Encounter (Signed)
EUS is within normal limits. Patient reports having the same pain that she had prior to cholecystectomy which she had in January 2014. Her LFTs have been normal. She's been on Naprosyn daily for about a month cannot tell any improvement. Patient desires to be referred to [facility]. Patient advised to stop Naprosyn. Will change her consultation at [facility].

## 2013-05-09 NOTE — Telephone Encounter (Signed)
Noted , thanks for update and help

## 2013-05-09 NOTE — Telephone Encounter (Signed)
Patient had EUS Friday (7/25), she states they didn't find anything, she is still having the pain, wants referral to [facility], EUS report is on your desk

## 2013-05-09 NOTE — Telephone Encounter (Signed)
Referral & notes faxed to [facility], they will contact patient with appt, patient is aware

## 2013-05-10 ENCOUNTER — Ambulatory Visit (INDEPENDENT_AMBULATORY_CARE_PROVIDER_SITE_OTHER): Payer: 59 | Admitting: Internal Medicine

## 2013-05-10 ENCOUNTER — Telehealth (INDEPENDENT_AMBULATORY_CARE_PROVIDER_SITE_OTHER): Payer: Self-pay | Admitting: *Deleted

## 2013-05-10 NOTE — Telephone Encounter (Signed)
I would like to see her in the office next week and determine next step.

## 2013-05-10 NOTE — Telephone Encounter (Signed)
Patient called in 10:48 and states she has heard from [facility] and they cannot see her until November.  Wants to know can she be sent somewhere else that they may can see sooner.  Please advise.  Do we want to go ahead and make her an appt. For her to discuss these options?

## 2013-05-10 NOTE — Telephone Encounter (Signed)
Called patient 1:54

## 2013-05-10 NOTE — Telephone Encounter (Signed)
Called patient --gave her an appt. As a work in 05/17/2013 @2 

## 2013-05-12 ENCOUNTER — Ambulatory Visit (HOSPITAL_COMMUNITY): Admission: RE | Admit: 2013-05-12 | Payer: Self-pay | Source: Ambulatory Visit | Admitting: Gastroenterology

## 2013-05-12 HISTORY — DX: Dermatitis, unspecified: L30.9

## 2013-05-12 HISTORY — DX: Anemia, unspecified: D64.9

## 2013-05-12 SURGERY — UPPER ENDOSCOPIC ULTRASOUND (EUS) LINEAR
Anesthesia: Monitor Anesthesia Care

## 2013-05-16 ENCOUNTER — Other Ambulatory Visit: Payer: Self-pay

## 2013-05-16 ENCOUNTER — Other Ambulatory Visit: Payer: Self-pay | Admitting: Family Medicine

## 2013-05-16 MED ORDER — MONTELUKAST SODIUM 10 MG PO TABS: 10.0000 mg | ORAL_TABLET | ORAL | Status: DC

## 2013-05-17 ENCOUNTER — Ambulatory Visit (INDEPENDENT_AMBULATORY_CARE_PROVIDER_SITE_OTHER): Payer: 59 | Admitting: Internal Medicine

## 2013-05-17 ENCOUNTER — Encounter (INDEPENDENT_AMBULATORY_CARE_PROVIDER_SITE_OTHER): Payer: Self-pay | Admitting: Internal Medicine

## 2013-05-17 VITALS — BP 118/70 | HR 68 | Temp 99.3°F | Resp 16 | Ht 66.0 in | Wt 154.0 lb

## 2013-05-17 DIAGNOSIS — R1011 Right upper quadrant pain: Secondary | ICD-10-CM

## 2013-05-17 DIAGNOSIS — R112 Nausea with vomiting, unspecified: Secondary | ICD-10-CM

## 2013-05-17 DIAGNOSIS — K219 Gastro-esophageal reflux disease without esophagitis: Secondary | ICD-10-CM

## 2013-05-17 MED ORDER — OMEPRAZOLE 20 MG PO CPDR: 20.0000 mg | DELAYED_RELEASE_CAPSULE | Freq: Every day | ORAL | Status: DC

## 2013-05-17 MED ORDER — DICYCLOMINE HCL 10 MG PO CAPS: 20.0000 mg | ORAL_CAPSULE | Freq: Three times a day (TID) | ORAL | Status: DC

## 2013-05-17 MED ORDER — ONDANSETRON HCL 4 MG PO TABS: 4.0000 mg | ORAL_TABLET | Freq: Two times a day (BID) | ORAL | Status: DC | PRN

## 2013-05-17 NOTE — Patient Instructions (Signed)
Notify if you have any side effects with dicyclomine. Physician will contact you with results of blood work.

## 2013-05-17 NOTE — Progress Notes (Signed)
Presenting complaint;  Persistent right upper quadrant pain and nausea.  Subjective:  Patient is 37 year old African female patient of Dr. Anthony Sar who is here for reevaluation of right upper quadrant abdominal pain. This pain started back in December 2013. She had normal EGD and colonoscopy. Ultrasound was negative for cholelithiasis and HIDA scan with CCK was normal. Abdominal pelvic CT was unremarkable. Her symptoms were felt to be compelling for gallbladder disease and she finally had cholecystectomy on 11/12/2012 with resolution of her pains lasting 3-4 months. Since her pain has been back she's been treated with NSAIDs for 3-4 weeks without symptomatic improvement. She had repeat abdominopelvic CT on 04/28/2013 and was unremarkable. Last month she also had endoscopic ultrasound by Dr. Christella Hartigan and was within normal limits. Consultation was arranged at [facility] but her appointment is not until November 2014. She continues to complain of right upper quadrant pain. She has dull pain which she can live with it she also has sharp pain which occurs couple of times a day and radiates into her right shoulder and associated with nausea and sporadic vomiting. She is getting some relief with pain medication. She denies hematemesis melena or rectal bleeding. She also reports a single episode of right lower quadrant abdominal pain 2 weeks ago. Bowels move once or twice daily. She has lost 13 pounds since her initial visit 7 months ago but she states she has lost close to 20 pounds since her symptoms began. She was also seen in emergency room on 05/01/2013 for the symptoms.  She does not smoke cigarettes or drink alcohol.  Current Medications: Current Outpatient Prescriptions  Medication Sig Dispense Refill  . albuterol (PROVENTIL HFA;VENTOLIN HFA) 108 (90 BASE) MCG/ACT inhaler Inhale 2 puffs into the lungs every 4 (four) hours as needed for wheezing or shortness of breath.      Marland Kitchen albuterol  (PROVENTIL) (2.5 MG/3ML) 0.083% nebulizer solution Take 2.5 mg by nebulization every 4 (four) hours as needed for wheezing or shortness of breath.      . dicyclomine (BENTYL) 10 MG capsule Take 1 capsule (10 mg total) by mouth 3 (three) times daily before meals.  90 capsule  2  . fluticasone (FLONASE) 50 MCG/ACT nasal spray Place 2 sprays into the nose daily.  16 g  4  . fluticasone-salmeterol (ADVAIR HFA) 230-21 MCG/ACT inhaler Inhale 2 puffs into the lungs 2 (two) times daily.  1 Inhaler  12  . HYDROcodone-acetaminophen (NORCO) 7.5-325 MG per tablet Take 1 tablet by mouth every 6 (six) hours as needed for pain.      Marland Kitchen levocetirizine (XYZAL) 5 MG tablet Take 1 tablet (5 mg total) by mouth every evening.  30 tablet  4  . montelukast (SINGULAIR) 10 MG tablet Take 1 tablet (10 mg total) by mouth every morning.  30 tablet  5  . omeprazole (PRILOSEC) 20 MG capsule Take 1 capsule (20 mg total) by mouth 2 (two) times daily.  60 capsule  3   No current facility-administered medications for this visit.     Objective: Blood pressure 118/70, pulse 68, temperature 99.3 F (37.4 C), temperature source Oral, resp. rate 16, height 5\' 6"  (1.676 m), weight 154 lb (69.854 kg), last menstrual period 05/17/2012. Patient is alert and in no acute distress. Conjunctiva is pink. Sclera is nonicteric Oropharyngeal mucosa is normal. No neck masses or thyromegaly noted. Cardiac exam with regular rhythm normal S1 and S2. No murmur or gallop noted. Lungs are clear to auscultation. Abdomen is symmetrical. Bowel  sounds are normal. Abdomen is soft with mild tenderness below right costal margin. No organomegaly or masses.  No LE edema or clubbing noted.  Labs/studies Results: Lab data from 05/01/2013 Serum lipase 10. WBC 5.8, H&H 11.5 and 32.1 and platelet count 291K. AST 16 ALT 14.  Assessment:  #1. Persistent right upper quadrant pain associated with nausea and sporadic vomiting and weight loss which appears  primarily due to diminish intake as meals tend to provoke pain. EGD back in generally 2014 was negative for peptic ulcer disease. Similarly colonoscopy was unremarkable. Cholecystectomy provided relief for 3-4 months. Workup to date includes negative abdominal pelvic CT and negative EUS. Heartburn is well controlled with therapy. Similarly IBS symptoms are well controlled with dicyclomine. She was also given empiric therapy with NSAID without symptomatic relief. Referral made to [facility] for her appointment would be in November 2014. Her symptoms are suggestive of sphincter of OD dysfunction but unfortunately we do not have any biochemical or imaging abnormalities to support this diagnosis.     Plan:  Patient will go to the lab for H. pylori serology and LFTs. If lab studies are normal will try to get her an earlier appointment at [facility]. Increase dicyclomine to 20 mg by mouth 3 times a day.

## 2013-05-18 ENCOUNTER — Encounter: Payer: Self-pay | Admitting: Family Medicine

## 2013-05-18 ENCOUNTER — Ambulatory Visit (INDEPENDENT_AMBULATORY_CARE_PROVIDER_SITE_OTHER): Payer: 59 | Admitting: Family Medicine

## 2013-05-18 VITALS — BP 118/78 | HR 88 | Resp 16 | Ht 66.0 in | Wt 154.8 lb

## 2013-05-18 DIAGNOSIS — R1011 Right upper quadrant pain: Secondary | ICD-10-CM

## 2013-05-18 DIAGNOSIS — G4700 Insomnia, unspecified: Secondary | ICD-10-CM | POA: Insufficient documentation

## 2013-05-18 LAB — HEPATIC FUNCTION PANEL
ALT: 17 U/L (ref 0–35)
Alkaline Phosphatase: 79 U/L (ref 39–117)
Indirect Bilirubin: 0.5 mg/dL (ref 0.0–0.9)
Total Protein: 6.8 g/dL (ref 6.0–8.3)

## 2013-05-18 MED ORDER — ZOLPIDEM TARTRATE ER 6.25 MG PO TBCR: 6.2500 mg | EXTENDED_RELEASE_TABLET | Freq: Every evening | ORAL | Status: DC | PRN

## 2013-05-18 NOTE — Patient Instructions (Addendum)
F/u in 6 weeks, call if you need me before  I am sorry that you continue to suffer from debilitating abdominal pain , We will continue to work on this with you  Work excuse from today to return Monday of 3rd week in October.August 01, 2013  If you get better before this, pls call and come in so you return sooner.  New medication for sleep, zolpidem one at bedtime  Walk every  Day for 30 minutes   Call for counseling through employee health at your job, if unavalable pls call back today so we can refer  No later than Friday

## 2013-05-18 NOTE — Assessment & Plan Note (Signed)
Uncontrolled and debilitating,will need additional time out of work until health improves,estimated return to work date is mid October based on current situation. If she is able to return prior to this she will let me know   FMLA to provide coverage for this time period

## 2013-05-18 NOTE — Assessment & Plan Note (Signed)
Trial of low dose zolpidem Pt to start regular walk, for both mental and physical health

## 2013-05-18 NOTE — Progress Notes (Signed)
  Subjective:    Patient ID: Jodi [name], female    DOB: [birth year], 37 y.o.   MRN: [id number]  HPI Still debilitated with pain Had ERCP non revealing, now en route to [facility] if additional test by local GI is unrevealing, which is an H pylori test. Appetite is poor, due to ongoing pain, chronic constant dull pain, interspersed with sharp pain which is debilitating, primarily in the RUQ radiating to shoulder and associated with nausea and vomiting at times.No change in bowel movements, no rectal  Bleeding,melena, hematemesis, or melena No fever or chills.     Review of Systems See HPI Denies recent fever or chills. Denies sinus pressure, nasal congestion, ear pain or sore throat. Denies chest congestion, productive cough or wheezing. Denies chest pains, palpitations and leg swelling .   Denies dysuria, frequency, hesitancy or incontinence. Denies joint pain, swelling and limitation in mobility. Denies headaches, seizures, numbness, or tingling. Mildly depressed, and suffering from some expected tearfulness, and anxiety due to ongoing health issues. Sleep is poor Denies skin break down or rash.        Objective:   Physical Exam  Patient alert and oriented and in no cardiopulmonary distress.  HEENT: No facial asymmetry, EOMI, no sinus tenderness,  oropharynx pink and moist.  Neck supple no adenopathy.  Chest: Clear to auscultation bilaterally.  CVS: S1, S2 no murmurs, no S3.  ABD: Soft non tender. Bowel sounds normal.  Ext: No edema   Psych: Good eye contact, normal affect. Memory intact anxious, tearful at times, overwhelmed due to poor health and negative effect on her ability to work, and enjoy a good quality of life. PHQ 9 score of 5 CNS: CN 2-12 intact, power, tone and sensation normal throughout.       Assessment & Plan:

## 2013-05-19 ENCOUNTER — Emergency Department (HOSPITAL_COMMUNITY): Payer: 59

## 2013-05-19 ENCOUNTER — Encounter (HOSPITAL_COMMUNITY): Payer: Self-pay | Admitting: *Deleted

## 2013-05-19 ENCOUNTER — Telehealth (INDEPENDENT_AMBULATORY_CARE_PROVIDER_SITE_OTHER): Payer: Self-pay | Admitting: *Deleted

## 2013-05-19 ENCOUNTER — Emergency Department (HOSPITAL_COMMUNITY)
Admission: EM | Admit: 2013-05-19 | Discharge: 2013-05-19 | Disposition: A | Discharge status: Home / Self Care | Payer: 59 | Attending: Emergency Medicine | Admitting: Emergency Medicine

## 2013-05-19 DIAGNOSIS — K589 Irritable bowel syndrome without diarrhea: Secondary | ICD-10-CM | POA: Insufficient documentation

## 2013-05-19 DIAGNOSIS — Z86718 Personal history of other venous thrombosis and embolism: Secondary | ICD-10-CM | POA: Insufficient documentation

## 2013-05-19 DIAGNOSIS — Z8709 Personal history of other diseases of the respiratory system: Secondary | ICD-10-CM | POA: Insufficient documentation

## 2013-05-19 DIAGNOSIS — Z79899 Other long term (current) drug therapy: Secondary | ICD-10-CM | POA: Insufficient documentation

## 2013-05-19 DIAGNOSIS — R1011 Right upper quadrant pain: Secondary | ICD-10-CM | POA: Insufficient documentation

## 2013-05-19 DIAGNOSIS — J45909 Unspecified asthma, uncomplicated: Secondary | ICD-10-CM | POA: Insufficient documentation

## 2013-05-19 DIAGNOSIS — Z872 Personal history of diseases of the skin and subcutaneous tissue: Secondary | ICD-10-CM | POA: Insufficient documentation

## 2013-05-19 DIAGNOSIS — G8929 Other chronic pain: Secondary | ICD-10-CM | POA: Insufficient documentation

## 2013-05-19 DIAGNOSIS — Z87891 Personal history of nicotine dependence: Secondary | ICD-10-CM | POA: Insufficient documentation

## 2013-05-19 DIAGNOSIS — R112 Nausea with vomiting, unspecified: Secondary | ICD-10-CM | POA: Insufficient documentation

## 2013-05-19 DIAGNOSIS — K219 Gastro-esophageal reflux disease without esophagitis: Secondary | ICD-10-CM | POA: Insufficient documentation

## 2013-05-19 DIAGNOSIS — Z862 Personal history of diseases of the blood and blood-forming organs and certain disorders involving the immune mechanism: Secondary | ICD-10-CM | POA: Insufficient documentation

## 2013-05-19 DIAGNOSIS — Z8639 Personal history of other endocrine, nutritional and metabolic disease: Secondary | ICD-10-CM | POA: Insufficient documentation

## 2013-05-19 DIAGNOSIS — Z9851 Tubal ligation status: Secondary | ICD-10-CM | POA: Insufficient documentation

## 2013-05-19 DIAGNOSIS — IMO0002 Reserved for concepts with insufficient information to code with codable children: Secondary | ICD-10-CM | POA: Insufficient documentation

## 2013-05-19 HISTORY — DX: Right upper quadrant pain: R10.11

## 2013-05-19 HISTORY — DX: Other chronic pain: G89.29

## 2013-05-19 LAB — URINE MICROSCOPIC-ADD ON

## 2013-05-19 LAB — URINALYSIS, ROUTINE W REFLEX MICROSCOPIC
Glucose, UA: NEGATIVE mg/dL
Hgb urine dipstick: NEGATIVE
Protein, ur: NEGATIVE mg/dL

## 2013-05-19 LAB — CBC WITH DIFFERENTIAL/PLATELET
Basophils Absolute: 0 10*3/uL (ref 0.0–0.1)
Basophils Relative: 1 % (ref 0–1)
Eosinophils Relative: 2 % (ref 0–5)
HCT: 33.6 % — ABNORMAL LOW (ref 36.0–46.0)
Lymphocytes Relative: 44 % (ref 12–46)
MCH: 30.3 pg (ref 26.0–34.0)
MCHC: 35.1 g/dL (ref 30.0–36.0)
MCV: 86.4 fL (ref 78.0–100.0)
Monocytes Absolute: 0.4 10*3/uL (ref 0.1–1.0)
RDW: 12.7 % (ref 11.5–15.5)

## 2013-05-19 LAB — BASIC METABOLIC PANEL
CO2: 26 mEq/L (ref 19–32)
Calcium: 9.3 mg/dL (ref 8.4–10.5)
Creatinine, Ser: 0.98 mg/dL (ref 0.50–1.10)

## 2013-05-19 LAB — LIPASE, BLOOD: Lipase: 14 U/L (ref 11–59)

## 2013-05-19 MED ORDER — FAMOTIDINE IN NACL 20-0.9 MG/50ML-% IV SOLN
20.0000 mg | Freq: Once | INTRAVENOUS | Status: AC
  Administered 2013-05-19: 20 mg via INTRAVENOUS
  Filled 2013-05-19: qty 50

## 2013-05-19 MED ORDER — PANTOPRAZOLE SODIUM 40 MG IV SOLR
40.0000 mg | Freq: Once | INTRAVENOUS | Status: AC
  Administered 2013-05-19: 40 mg via INTRAVENOUS
  Filled 2013-05-19: qty 40

## 2013-05-19 MED ORDER — OXYCODONE-ACETAMINOPHEN 5-325 MG PO TABS: ORAL_TABLET | ORAL | Status: DC

## 2013-05-19 MED ORDER — ONDANSETRON HCL 4 MG/2ML IJ SOLN
4.0000 mg | Freq: Once | INTRAMUSCULAR | Status: AC
  Administered 2013-05-19: 4 mg via INTRAVENOUS
  Filled 2013-05-19: qty 2

## 2013-05-19 MED ORDER — MORPHINE SULFATE 4 MG/ML IJ SOLN
4.0000 mg | INTRAMUSCULAR | Status: DC | PRN
  Administered 2013-05-19: 4 mg via INTRAVENOUS
  Filled 2013-05-19: qty 1

## 2013-05-19 MED ORDER — FENTANYL CITRATE 0.05 MG/ML IJ SOLN
100.0000 ug | Freq: Once | INTRAMUSCULAR | Status: AC
  Administered 2013-05-19: 100 ug via INTRAMUSCULAR
  Filled 2013-05-19: qty 2

## 2013-05-19 MED ORDER — FENTANYL CITRATE 0.05 MG/ML IJ SOLN
100.0000 ug | INTRAMUSCULAR | Status: DC | PRN
  Administered 2013-05-19: 100 ug via INTRAVENOUS
  Filled 2013-05-19: qty 2

## 2013-05-19 MED ORDER — ONDANSETRON HCL 4 MG PO TABS: 4.0000 mg | ORAL_TABLET | Freq: Three times a day (TID) | ORAL | Status: DC | PRN

## 2013-05-19 MED ORDER — ONDANSETRON HCL 4 MG/2ML IJ SOLN
4.0000 mg | INTRAMUSCULAR | Status: DC | PRN
  Administered 2013-05-19: 4 mg via INTRAVENOUS
  Filled 2013-05-19: qty 2

## 2013-05-19 NOTE — ED Notes (Signed)
Pt alert & oriented x4, stable gait. Patient given discharge instructions, paperwork & prescription(s). Patient  instructed to stop at the registration desk to finish any additional paperwork. Patient verbalized understanding. Pt left department w/ no further questions. 

## 2013-05-19 NOTE — ED Notes (Signed)
Pt reports right upper quadrant pain that started again tonight. Pt states she has been seen by her PCP & a gastrologist. All testing clear. To be seen in [place] in November. Pt states has taken 2 hydrocodone within the last 2 hours.

## 2013-05-19 NOTE — Telephone Encounter (Signed)
Next time she has to go to ER she should go [facility] or [facility]. In the meantime she can continue pain medication.

## 2013-05-19 NOTE — ED Provider Notes (Signed)
CSN: [id number]     Arrival date & time 05/19/13  0053 History     First MD Initiated Contact with Patient 05/19/13 0144     Chief Complaint  Patient presents with  . Abdominal Pain    HPI Pt was seen at 0235.  Per pt, c/o gradual onset and persistence of constant acute flair of her chronic right upper abd "pain" for the past 6+ months, worse over the past several days. Has been associated with multiple intermittent episodes of N/V.  Describes the abd pain as "throbbing."  Denies any change in her usual chronic pain pattern. Denies diarrhea, no fevers, no back pain, no rash, no CP/SOB, no black or blood in stools or emesis. The symptoms have been associated with no other complaints. The patient has a significant history of similar symptoms previously, recently being evaluated for this complaint and multiple prior evals for same. Pt has been extensively evaluated for same without definitive dx. Pt has been evaluated by her PMD yesterday and GI MD 2 days ago for same. States she is to be referred to a specialist at [facility] in November but her PMD "is trying to get me into [facility] sooner."         Past Medical History  Diagnosis Date  . Asthma   . DVT (deep venous thrombosis) 10 yrs ago  . Asthma   . Allergic rhinitis   . Hyperlipidemia   . Shortness of breath     SOB even without exertion at times  . GERD (gastroesophageal reflux disease)   . IBS (irritable bowel syndrome)   . 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 Anemia   . Eczema   . Abdominal pain, chronic, right upper quadrant    Past Surgical History  Procedure Laterality Date  . Blood clot removed from lower abdomen  2000  . Cyst removed from ovary and fluid removed from tubes  2000  . Partial hysterectomy  2009  . Esophagogastroduodenoscopy  10/22/2012    Procedure: ESOPHAGOGASTRODUODENOSCOPY (EGD);  Surgeon: Malissa Hippo, MD;  Location: [facility];  Service: Endoscopy;  Laterality: N/A;  100  .  Colonoscopy  11/04/2012    Procedure: COLONOSCOPY;  Surgeon: Malissa Hippo, MD;  Location: [facility]O [facility];  Service: Endoscopy;  Laterality: N/A;  915  . Abdominal hysterectomy  4-5 yrs ago  . Tubal ligation  1999  . Cholecystectomy  11/12/2012    Procedure: LAPAROSCOPIC CHOLECYSTECTOMY;  Surgeon: Dalia Heading, MD;  Location: [facility] ORS;  Service: General;  Laterality: N/A;   Family History  Problem Relation Age of Onset  . Asthma Mother   . Hypertension Mother   . Bronchiolitis Mother   . Hypertension Father   . Hyperlipidemia Father     recurrent rectum polyps   . Diabetes Sister   . Lupus Sister   . Depression Sister    History  Substance Use Topics  . Smoking status: Former Smoker -- 0.25 packs/day for 10 years    Types: Cigarettes    Quit date: 10/13/2008  . Smokeless tobacco: Never Used  . Alcohol Use: No    Review of Systems ROS: Statement: All systems negative except as marked or noted in the HPI; Constitutional: Negative for fever and chills. ; ; Eyes: Negative for eye pain, redness and discharge. ; ; ENMT: Negative for ear pain, hoarseness, nasal congestion, sinus pressure and sore throat. ; ; Cardiovascular: Negative for chest pain, palpitations, diaphoresis,  dyspnea and peripheral edema. ; ; Respiratory: Negative for cough, wheezing and stridor. ; ; Gastrointestinal: +N/V, abd pain. Negative for diarrhea, blood in stool, hematemesis, jaundice and rectal bleeding. . ; ; Genitourinary: Negative for dysuria, flank pain and hematuria. ; ; Musculoskeletal: Negative for back pain and neck pain. Negative for swelling and trauma.; ; Skin: Negative for pruritus, rash, abrasions, blisters, bruising and skin lesion.; ; Neuro: Negative for headache, lightheadedness and neck stiffness. Negative for weakness, altered level of consciousness , altered mental status, extremity weakness, paresthesias, involuntary movement, seizure and syncope.       Allergies  Compazine  Home Medications    Current Outpatient Rx  Name  Route  Sig  Dispense  Refill  . albuterol (PROVENTIL HFA;VENTOLIN HFA) 108 (90 BASE) MCG/ACT inhaler   Inhalation   Inhale 2 puffs into the lungs every 4 (four) hours as needed for wheezing or shortness of breath.         Marland Kitchen albuterol (PROVENTIL) (2.5 MG/3ML) 0.083% nebulizer solution   Nebulization   Take 2.5 mg by nebulization every 4 (four) hours as needed for wheezing or shortness of breath.         . dicyclomine (BENTYL) 10 MG capsule   Oral   Take 2 capsules (20 mg total) by mouth 3 (three) times daily before meals.   90 capsule   2   . fluticasone (FLONASE) 50 MCG/ACT nasal spray   Nasal   Place 2 sprays into the nose daily.   16 g   4   . fluticasone-salmeterol (ADVAIR HFA) 230-21 MCG/ACT inhaler   Inhalation   Inhale 2 puffs into the lungs 2 (two) times daily.   1 Inhaler   12   . HYDROcodone-acetaminophen (NORCO) 7.5-325 MG per tablet   Oral   Take 1 tablet by mouth every 6 (six) hours as needed for pain.         Marland Kitchen levocetirizine (XYZAL) 5 MG tablet   Oral   Take 1 tablet (5 mg total) by mouth every evening.   30 tablet   4   . montelukast (SINGULAIR) 10 MG tablet   Oral   Take 1 tablet (10 mg total) by mouth every morning.   30 tablet   5   . omeprazole (PRILOSEC) 20 MG capsule   Oral   Take 1 capsule (20 mg total) by mouth daily.   30 capsule   5   . ondansetron (ZOFRAN) 4 MG tablet   Oral   Take 1 tablet (4 mg total) by mouth every 12 (twelve) hours as needed for nausea.   30 tablet   1   . zolpidem (AMBIEN CR) 6.25 MG CR tablet   Oral   Take 1 tablet (6.25 mg total) by mouth at bedtime as needed for sleep.   30 tablet   1    BP 124/68  Pulse 92  Temp(Src) 97.9 F (36.6 C) (Oral)  Resp 20  Ht 5\' 6"  (1.676 m)  Wt 150 lb (68.04 kg)  BMI 24.22 kg/m2  SpO2 100%  LMP 05/17/2012 Physical Exam 0240: Physical examination:  Nursing notes reviewed; Vital signs and O2 SAT reviewed;  Constitutional: Well  developed, Well nourished, Well hydrated, Uncomfortable appearing.; Head:  Normocephalic, atraumatic; Eyes: EOMI, PERRL, No scleral icterus; ENMT: Mouth and pharynx normal, Mucous membranes moist; Neck: Supple, Full range of motion, No lymphadenopathy; Cardiovascular: Regular rate and rhythm, No murmur, rub, or gallop; Respiratory: Breath sounds clear & equal bilaterally, No rales,  rhonchi, wheezes.  Speaking full sentences with ease, Normal respiratory effort/excursion; Chest: Nontender, Movement normal; Abdomen: Soft, +RUQ and mid-epigastric areas tender to palp. Nondistended, Normal bowel sounds; Genitourinary: No CVA tenderness; Extremities: Pulses normal, No tenderness, No edema, No calf edema or asymmetry.; Neuro: AA&Ox3, Major CN grossly intact.  Speech clear. No gross focal motor or sensory deficits in extremities.; Skin: Color normal, Warm, Dry.   ED Course   Procedures     MDM  MDM Reviewed: previous chart, nursing note and vitals Reviewed previous: labs Interpretation: labs and x-ray   Results for orders placed during the hospital encounter of 05/19/13  URINALYSIS, ROUTINE W REFLEX MICROSCOPIC      Result Value Range   Color, Urine YELLOW  YELLOW   APPearance CLEAR  CLEAR   Specific Gravity, Urine 1.015  1.005 - 1.030   pH 5.5  5.0 - 8.0   Glucose, UA NEGATIVE  NEGATIVE mg/dL   Hgb urine dipstick NEGATIVE  NEGATIVE   Bilirubin Urine NEGATIVE  NEGATIVE   Ketones, ur TRACE (*) NEGATIVE mg/dL   Protein, ur NEGATIVE  NEGATIVE mg/dL   Urobilinogen, UA 0.2  0.0 - 1.0 mg/dL   Nitrite NEGATIVE  NEGATIVE   Leukocytes, UA TRACE (*) NEGATIVE  CBC WITH DIFFERENTIAL      Result Value Range   WBC 6.6  4.0 - 10.5 K/uL   RBC 3.89  3.87 - 5.11 MIL/uL   Hemoglobin 11.8 (*) 12.0 - 15.0 g/dL   HCT 16.1 (*) 09.6 - 04.5 %   MCV 86.4  78.0 - 100.0 fL   MCH 30.3  26.0 - 34.0 pg   MCHC 35.1  30.0 - 36.0 g/dL   RDW 40.9  81.1 - 91.4 %   Platelets 355  150 - 400 K/uL   Neutrophils Relative  % 47  43 - 77 %   Neutro Abs 3.1  1.7 - 7.7 K/uL   Lymphocytes Relative 44  12 - 46 %   Lymphs Abs 2.9  0.7 - 4.0 K/uL   Monocytes Relative 6  3 - 12 %   Monocytes Absolute 0.4  0.1 - 1.0 K/uL   Eosinophils Relative 2  0 - 5 %   Eosinophils Absolute 0.2  0.0 - 0.7 K/uL   Basophils Relative 1  0 - 1 %   Basophils Absolute 0.0  0.0 - 0.1 K/uL  BASIC METABOLIC PANEL      Result Value Range   Sodium 135  135 - 145 mEq/L   Potassium 3.7  3.5 - 5.1 mEq/L   Chloride 100  96 - 112 mEq/L   CO2 26  19 - 32 mEq/L   Glucose, Bld 128 (*) 70 - 99 mg/dL   BUN 11  6 - 23 mg/dL   Creatinine, Ser 7.82  0.50 - 1.10 mg/dL   Calcium 9.3  8.4 - 95.6 mg/dL   GFR calc non Af Amer 72 (*) >90 mL/min   GFR calc Af Amer 84 (*) >90 mL/min  LIPASE, BLOOD      Result Value Range   Lipase 14  11 - 59 U/L  URINE MICROSCOPIC-ADD ON      Result Value Range   Squamous Epithelial / LPF FEW (*) RARE   WBC, UA 0-2  <3 WBC/hpf   RBC / HPF 0-2  <3 RBC/hpf   Bacteria, UA FEW (*) RARE   Ct Abdomen Pelvis W Contrast 04/28/2013   *RADIOLOGY REPORT*  Clinical Data: Right upper quadrant abdominal  pain and nausea.  CT ABDOMEN AND PELVIS WITH CONTRAST  Technique:  Multidetector CT imaging of the abdomen and pelvis was performed following the standard protocol during bolus administration of intravenous contrast.  Contrast: 100 mL of Omnipaque 300 IV contrast  Comparison: Abdominal radiograph performed 03/12/2013, and CT of the abdomen and pelvis performed 10/19/2012  Findings: The visualized lung bases are clear.  Scattered hepatic cysts are perhaps slightly more prominent than on the prior study, measuring up to 1.0 cm in size.  The liver is otherwise unremarkable in appearance.  The spleen is within normal limits.  The patient is status post cholecystectomy, with clips noted along the gallbladder fossa.  The pancreas and adrenal glands are unremarkable.  A tiny 0.4 cm cyst is noted at the upper pole of the right kidney. The kidneys  are otherwise unremarkable in appearance.  There is no evidence of hydronephrosis.  No renal or ureteral stones are seen. No perinephric stranding is appreciated.  No free fluid is identified.  The small bowel is unremarkable in appearance.  The stomach is within normal limits.  No acute vascular abnormalities are seen.  The appendix remains normal in caliber and contains minimal air, without evidence for appendicitis.  Minimal diverticulosis is noted along the proximal descending colon, without evidence of diverticulitis.  The bladder is mildly distended and grossly unremarkable in appearance.  The the patient is status post partial hysterectomy; the vaginal cuff is grossly unremarkable in appearance.  Scattered small left ovarian cysts are unchanged in appearance.  No suspicious adnexal masses are seen.  Trace free fluid within the pelvis is likely physiologic in nature.  No inguinal lymphadenopathy is seen.  No acute osseous abnormalities are identified.  IMPRESSION:  1.  No acute abnormality seen to explain the patient's symptoms. Status post cholecystectomy; no evidence of retained stones. 2.  Likely small hepatic cysts and right renal cyst again seen. 3.  Minimal diverticulosis along the proximal descending colon, without evidence of diverticulitis.   Original Report Authenticated By: Tonia Ghent, M.D.   Dg Abd Acute W/chest 05/19/2013   *RADIOLOGY REPORT*  Clinical Data: Right upper quadrant pain.  Nausea.  ACUTE ABDOMEN SERIES (ABDOMEN 2 VIEW & CHEST 1 VIEW)  Comparison: Chest and two views abdomen 03/12/2013 and CT abdomen and pelvis 04/28/2013.  Findings: Single view of the chest demonstrates clear lungs and normal heart size.  No pneumothorax or pleural fluid.  Two views of the abdomen show a normal bowel gas pattern.  No free intraperitoneal air is identified.  Cholecystectomy clips are noted.  IMPRESSION: Negative exam.   Original Report Authenticated By: Holley Dexter, M.D.    [phone]:  Pt  sleeping soundly, NAD. Tol PO well without N/V. Ready to go home now. No acute findings on workup today. Will not repeat CT A/P.  Long hx of chronic pain with multiple ED visits for same.  Pt endorses acute flair of her usual long standing chronic pain today, no change from her usual chronic pain pattern.  Pt encouraged to f/u with her PMD and GI MD for good continuity of care and control of her chronic pain.  Verb understanding. Dx and testing d/w pt and family.  Questions answered.  Verb understanding, agreeable to d/c home with outpt f/u.    Laray Anger, DO 05/21/13 1650

## 2013-05-19 NOTE — Telephone Encounter (Signed)
This will be forwarded to Dr. Karilyn Cota for review.

## 2013-05-19 NOTE — Telephone Encounter (Signed)
Spoke to Ms Yohn advising her I was sending record to [facility] for her appt and they would contact her with appt -- she states she had to go to ED last night for the pain and they sent her home and she's still in pain and wants to know what she can do -- please call [phone]

## 2013-05-19 NOTE — Telephone Encounter (Signed)
Patient was called and given Dr.Rehman's recommendations. She voiced understanding.

## 2013-05-31 ENCOUNTER — Encounter (INDEPENDENT_AMBULATORY_CARE_PROVIDER_SITE_OTHER): Payer: Self-pay

## 2013-06-03 ENCOUNTER — Encounter (HOSPITAL_COMMUNITY): Payer: Self-pay | Admitting: *Deleted

## 2013-06-03 ENCOUNTER — Other Ambulatory Visit (INDEPENDENT_AMBULATORY_CARE_PROVIDER_SITE_OTHER): Payer: Self-pay | Admitting: Internal Medicine

## 2013-06-03 ENCOUNTER — Telehealth (INDEPENDENT_AMBULATORY_CARE_PROVIDER_SITE_OTHER): Payer: Self-pay | Admitting: *Deleted

## 2013-06-03 ENCOUNTER — Telehealth: Payer: Self-pay | Admitting: Family Medicine

## 2013-06-03 ENCOUNTER — Emergency Department (HOSPITAL_COMMUNITY)
Admission: EM | Admit: 2013-06-03 | Discharge: 2013-06-03 | Disposition: A | Discharge status: Home / Self Care | Payer: 59 | Attending: Emergency Medicine | Admitting: Emergency Medicine

## 2013-06-03 DIAGNOSIS — Z872 Personal history of diseases of the skin and subcutaneous tissue: Secondary | ICD-10-CM | POA: Insufficient documentation

## 2013-06-03 DIAGNOSIS — Z9889 Other specified postprocedural states: Secondary | ICD-10-CM | POA: Insufficient documentation

## 2013-06-03 DIAGNOSIS — Z8709 Personal history of other diseases of the respiratory system: Secondary | ICD-10-CM | POA: Insufficient documentation

## 2013-06-03 DIAGNOSIS — Z9089 Acquired absence of other organs: Secondary | ICD-10-CM | POA: Insufficient documentation

## 2013-06-03 DIAGNOSIS — Z8719 Personal history of other diseases of the digestive system: Secondary | ICD-10-CM | POA: Insufficient documentation

## 2013-06-03 DIAGNOSIS — G8929 Other chronic pain: Secondary | ICD-10-CM

## 2013-06-03 DIAGNOSIS — K219 Gastro-esophageal reflux disease without esophagitis: Secondary | ICD-10-CM | POA: Insufficient documentation

## 2013-06-03 DIAGNOSIS — Z9851 Tubal ligation status: Secondary | ICD-10-CM | POA: Insufficient documentation

## 2013-06-03 DIAGNOSIS — R1011 Right upper quadrant pain: Secondary | ICD-10-CM | POA: Insufficient documentation

## 2013-06-03 DIAGNOSIS — Z8639 Personal history of other endocrine, nutritional and metabolic disease: Secondary | ICD-10-CM | POA: Insufficient documentation

## 2013-06-03 DIAGNOSIS — J45909 Unspecified asthma, uncomplicated: Secondary | ICD-10-CM | POA: Insufficient documentation

## 2013-06-03 DIAGNOSIS — R112 Nausea with vomiting, unspecified: Secondary | ICD-10-CM | POA: Insufficient documentation

## 2013-06-03 DIAGNOSIS — Z87891 Personal history of nicotine dependence: Secondary | ICD-10-CM | POA: Insufficient documentation

## 2013-06-03 DIAGNOSIS — IMO0002 Reserved for concepts with insufficient information to code with codable children: Secondary | ICD-10-CM | POA: Insufficient documentation

## 2013-06-03 DIAGNOSIS — Z79899 Other long term (current) drug therapy: Secondary | ICD-10-CM | POA: Insufficient documentation

## 2013-06-03 DIAGNOSIS — Z90710 Acquired absence of both cervix and uterus: Secondary | ICD-10-CM | POA: Insufficient documentation

## 2013-06-03 DIAGNOSIS — Z862 Personal history of diseases of the blood and blood-forming organs and certain disorders involving the immune mechanism: Secondary | ICD-10-CM | POA: Insufficient documentation

## 2013-06-03 DIAGNOSIS — Z86718 Personal history of other venous thrombosis and embolism: Secondary | ICD-10-CM | POA: Insufficient documentation

## 2013-06-03 LAB — URINALYSIS, ROUTINE W REFLEX MICROSCOPIC
Bilirubin Urine: NEGATIVE
Glucose, UA: NEGATIVE mg/dL
Hgb urine dipstick: NEGATIVE
Ketones, ur: NEGATIVE mg/dL
Protein, ur: NEGATIVE mg/dL

## 2013-06-03 MED ORDER — ONDANSETRON HCL 4 MG/2ML IJ SOLN
INTRAMUSCULAR | Status: AC
  Administered 2013-06-03: 4 mg via INTRAVENOUS
  Filled 2013-06-03: qty 2

## 2013-06-03 MED ORDER — ONDANSETRON HCL 4 MG/2ML IJ SOLN: 4.0000 mg | Freq: Once | INTRAMUSCULAR | Status: AC

## 2013-06-03 MED ORDER — HYDROMORPHONE HCL PF 1 MG/ML IJ SOLN
INTRAMUSCULAR | Status: AC
  Filled 2013-06-03: qty 1

## 2013-06-03 MED ORDER — HYDROMORPHONE HCL PF 1 MG/ML IJ SOLN
1.0000 mg | Freq: Once | INTRAMUSCULAR | Status: AC
  Administered 2013-06-03: 1 mg via INTRAMUSCULAR
  Filled 2013-06-03: qty 1

## 2013-06-03 MED ORDER — HYDROMORPHONE HCL PF 1 MG/ML IJ SOLN
1.0000 mg | Freq: Once | INTRAMUSCULAR | Status: AC
  Administered 2013-06-03: 1 mg via INTRAVENOUS

## 2013-06-03 NOTE — ED Provider Notes (Signed)
CSN: [id number]     Arrival date & time 06/03/13  0207 History     First MD Initiated Contact with Patient 06/03/13 0234     Chief Complaint  Patient presents with  . Abdominal Pain  . Nausea   (Consider location/radiation/quality/duration/timing/severity/associated sxs/prior Treatment) HPI History provided by the patient. Recurrent and persistence of her chronic right upper abd "pain" for the past 6 months, worse over the past few hours. Has been associated with multiple intermittent episodes of N/V. Describes the abd pain as "throbbing." Denies any change in her usual chronic pain pattern. Denies diarrhea, no fevers, no back pain, no rash, no CP/SOB, no black or blood in stools or emesis. The symptoms have been associated with no other complaints. The patient has a significant history of similar symptoms previously, recently being evaluated for this complaint and multiple prior evals for same. She is ultrasounds and CT scans. She is followed locally by GI Dr. Renae Fickle. States she is to be referred to a specialist at [facility] in November and is also scheduled to be evaluated by GI at [facility] in September. She declines any labs or imaging at this time and is only requesting IV narcotics. She is taking hydrocodone at home without relief.   Past Medical History  Diagnosis Date  . Asthma   . DVT (deep venous thrombosis) 10 yrs ago  . Asthma   . Allergic rhinitis   . Hyperlipidemia   . Shortness of breath     SOB even without exertion at times  . GERD (gastroesophageal reflux disease)   . IBS (irritable bowel syndrome)   . 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 Anemia   . Eczema   . Abdominal pain, chronic, right upper quadrant    Past Surgical History  Procedure Laterality Date  . Blood clot removed from lower abdomen  2000  . Cyst removed from ovary and fluid removed from tubes  2000  . Partial hysterectomy  2009  . Esophagogastroduodenoscopy  10/22/2012   Procedure: ESOPHAGOGASTRODUODENOSCOPY (EGD);  Surgeon: Malissa Hippo, MD;  Location: [facility];  Service: Endoscopy;  Laterality: N/A;  100  . Colonoscopy  11/04/2012    Procedure: COLONOSCOPY;  Surgeon: Malissa Hippo, MD;  Location: [facility];  Service: Endoscopy;  Laterality: N/A;  915  . Abdominal hysterectomy  4-5 yrs ago  . Tubal ligation  1999  . Cholecystectomy  11/12/2012    Procedure: LAPAROSCOPIC CHOLECYSTECTOMY;  Surgeon: Dalia Heading, MD;  Location: [facility] ORS;  Service: General;  Laterality: N/A;   Family History  Problem Relation Age of Onset  . Asthma Mother   . Hypertension Mother   . Bronchiolitis Mother   . Hypertension Father   . Hyperlipidemia Father     recurrent rectum polyps   . Diabetes Sister   . Lupus Sister   . Depression Sister    History  Substance Use Topics  . Smoking status: Former Smoker -- 0.25 packs/day for 10 years    Types: Cigarettes    Quit date: 10/13/2008  . Smokeless tobacco: Never Used  . Alcohol Use: No   OB History   Grav Para Term Preterm Abortions TAB SAB Ect Mult Living                 Review of Systems  Constitutional: Negative for fever and chills.  HENT: Negative for neck pain and neck stiffness.   Eyes: Negative for pain.  Respiratory: Negative for shortness of breath.   Cardiovascular: Negative for chest pain.  Gastrointestinal: Positive for nausea and abdominal pain. Negative for vomiting.  Genitourinary: Negative for dysuria.  Musculoskeletal: Negative for back pain.  Skin: Negative for rash.  Neurological: Negative for headaches.  All other systems reviewed and are negative.    Allergies  Compazine  Home Medications   Current Outpatient Rx  Name  Route  Sig  Dispense  Refill  . albuterol (PROVENTIL HFA;VENTOLIN HFA) 108 (90 BASE) MCG/ACT inhaler   Inhalation   Inhale 2 puffs into the lungs every 4 (four) hours as needed for wheezing or shortness of breath.         Marland Kitchen albuterol (PROVENTIL) (2.5  MG/3ML) 0.083% nebulizer solution   Nebulization   Take 2.5 mg by nebulization every 4 (four) hours as needed for wheezing or shortness of breath.         . dicyclomine (BENTYL) 10 MG capsule   Oral   Take 2 capsules (20 mg total) by mouth 3 (three) times daily before meals.   90 capsule   2   . fluticasone (FLONASE) 50 MCG/ACT nasal spray   Nasal   Place 2 sprays into the nose daily.   16 g   4   . fluticasone-salmeterol (ADVAIR HFA) 230-21 MCG/ACT inhaler   Inhalation   Inhale 2 puffs into the lungs 2 (two) times daily.   1 Inhaler   12   . HYDROcodone-acetaminophen (NORCO/VICODIN) 5-325 MG per tablet   Oral   Take 1 tablet by mouth every 6 (six) hours as needed for pain.         Marland Kitchen levocetirizine (XYZAL) 5 MG tablet   Oral   Take 1 tablet (5 mg total) by mouth every evening.   30 tablet   4   . montelukast (SINGULAIR) 10 MG tablet   Oral   Take 1 tablet (10 mg total) by mouth every morning.   30 tablet   5   . omeprazole (PRILOSEC) 20 MG capsule   Oral   Take 1 capsule (20 mg total) by mouth daily.   30 capsule   5   . ondansetron (ZOFRAN) 4 MG tablet   Oral   Take 1 tablet (4 mg total) by mouth every 8 (eight) hours as needed for nausea.   8 tablet   0   . zolpidem (AMBIEN CR) 6.25 MG CR tablet   Oral   Take 1 tablet (6.25 mg total) by mouth at bedtime as needed for sleep.   30 tablet   1    BP 135/85  Pulse 99  Temp(Src) 98.6 F (37 C) (Oral)  Resp 20  Ht 5\' 6"  (1.676 m)  Wt 154 lb (69.854 kg)  BMI 24.87 kg/m2  SpO2 96%  LMP 05/17/2012 Physical Exam  Constitutional: She is oriented to person, place, and time. She appears well-developed and well-nourished.  HENT:  Head: Normocephalic and atraumatic.  Eyes: EOM are normal. Pupils are equal, round, and reactive to light.  Neck: Neck supple.  Cardiovascular: Normal rate, regular rhythm and intact distal pulses.   Pulmonary/Chest: Effort normal and breath sounds normal. No respiratory  distress.  Abdominal: Soft. Bowel sounds are normal. She exhibits no distension.  Tender gastric and right upper quadrant without guarding or rebound. No tenderness otherwise  Musculoskeletal: Normal range of motion. She exhibits no edema.  Neurological: She is alert and oriented to person, place, and time.  Skin: Skin is warm and  dry.    ED Course   Procedures (including critical care time)  Results for orders placed during the hospital encounter of 06/03/13  URINALYSIS, ROUTINE W REFLEX MICROSCOPIC      Result Value Range   Color, Urine YELLOW  YELLOW   APPearance CLEAR  CLEAR   Specific Gravity, Urine 1.020  1.005 - 1.030   pH 6.0  5.0 - 8.0   Glucose, UA NEGATIVE  NEGATIVE mg/dL   Hgb urine dipstick NEGATIVE  NEGATIVE   Bilirubin Urine NEGATIVE  NEGATIVE   Ketones, ur NEGATIVE  NEGATIVE mg/dL   Protein, ur NEGATIVE  NEGATIVE mg/dL   Urobilinogen, UA 0.2  0.0 - 1.0 mg/dL   Nitrite NEGATIVE  NEGATIVE   Leukocytes, UA NEGATIVE  NEGATIVE   Dg Abd Acute W/chest  05/19/2013   *RADIOLOGY REPORT*  Clinical Data: Right upper quadrant pain.  Nausea.  ACUTE ABDOMEN SERIES (ABDOMEN 2 VIEW & CHEST 1 VIEW)  Comparison: Chest and two views abdomen 03/12/2013 and CT abdomen and pelvis 04/28/2013.  Findings: Single view of the chest demonstrates clear lungs and normal heart size.  No pneumothorax or pleural fluid.  Two views of the abdomen show a normal bowel gas pattern.  No free intraperitoneal air is identified.  Cholecystectomy clips are noted.  IMPRESSION: Negative exam.   Original Report Authenticated By: Holley Dexter, M.D.   IV dilaudid and Zofran provided with intermittent pain.  Plan discharge home, continue on narcotics as prescribed and followup with primary care physician in the office. Patient agrees to keep her scheduled GI followup at tertiary care center. Return precautions verbalized as understood. MDM  Acute flare of chronic abdominal pain.  Previous records reviewed  including multiple ED visits for the same. Previous x-ray reviewed as above.  UA obtained no UTI  Improved with IV narcotics  Vital signs and nursing notes reviewed and considered  Sunnie Nielsen, MD 06/03/13 [phone]

## 2013-06-03 NOTE — Telephone Encounter (Signed)
Jodi [name] said she went to the ED last night, 06/02/13, and was advised to call Dr. Patty Sermons office for her pain medication. She has been referred to  [facility] by Dr. Karilyn Cota. Her apt's are not until Sep and Nov of this year. I advised Jodi [name] that Dr. Karilyn Cota is out of the office until Monday, 06/06/13, and has she tried her PCP Dr. Lodema Hong? "Yes and was told by Dr. Lodema Hong she would prefer Dr. Karilyn Cota to give her the pain medicine." The return phone number is [phone].

## 2013-06-03 NOTE — ED Notes (Signed)
Pt c/o ruq pain and nausea x 2 months. Pt has an appt at [facility] on Sept. 18th and one in [place] in November.

## 2013-06-03 NOTE — Telephone Encounter (Signed)
Forwarded to Dr.Rehman to address. 

## 2013-06-03 NOTE — Telephone Encounter (Signed)
Patient asking for pain meds, was just in ED a few days ago for chronic abdominal pain and was given #15 tabs on Aug 19. Patient is almost out and still in pain. Please advise

## 2013-06-03 NOTE — Telephone Encounter (Signed)
Patient aware.

## 2013-06-03 NOTE — Telephone Encounter (Signed)
Advise her that I recommend she has GI Doc treat her abdominal pain

## 2013-06-06 ENCOUNTER — Telehealth: Payer: Self-pay | Admitting: Family Medicine

## 2013-06-06 ENCOUNTER — Other Ambulatory Visit: Payer: Self-pay | Admitting: Family Medicine

## 2013-06-06 DIAGNOSIS — R109 Unspecified abdominal pain: Secondary | ICD-10-CM

## 2013-06-06 NOTE — Telephone Encounter (Signed)
Called pharmacy to receive medication pick up list.

## 2013-06-06 NOTE — Telephone Encounter (Signed)
Patient was called and made aware, she states that she is going to call Dr.Simpson and let her know as Dr.Simpson had told her that she would need to call Dr.Rehman for a prescription of the pain medication.

## 2013-06-06 NOTE — Telephone Encounter (Signed)
I would like for her to get pain medications from Dr. Theresa Mulligan. She is also getting pain medications from Dr. Hilda Lias. She needs to be getting pain medication from one physician to that therapy can be tracked. Please call patient.

## 2013-06-07 ENCOUNTER — Telehealth: Payer: Self-pay | Admitting: Family Medicine

## 2013-06-07 NOTE — Telephone Encounter (Signed)
Attempted to contact pt again and had to leave her a message  That i will call back in the am I would like to also speak with Dr Hilda Lias

## 2013-06-07 NOTE — Telephone Encounter (Signed)
Called pt and explained that I would call her back by lunch time to explain the course of action, currently with pts

## 2013-06-07 NOTE — Telephone Encounter (Signed)
Noted  

## 2013-06-08 ENCOUNTER — Emergency Department (HOSPITAL_COMMUNITY)
Admission: EM | Admit: 2013-06-08 | Discharge: 2013-06-08 | Disposition: A | Discharge status: Home / Self Care | Payer: 59 | Attending: Emergency Medicine | Admitting: Emergency Medicine

## 2013-06-08 ENCOUNTER — Ambulatory Visit (INDEPENDENT_AMBULATORY_CARE_PROVIDER_SITE_OTHER): Payer: 59 | Admitting: Family Medicine

## 2013-06-08 ENCOUNTER — Encounter: Payer: Self-pay | Admitting: Family Medicine

## 2013-06-08 ENCOUNTER — Encounter (HOSPITAL_COMMUNITY): Payer: Self-pay | Admitting: Emergency Medicine

## 2013-06-08 ENCOUNTER — Emergency Department (HOSPITAL_COMMUNITY): Payer: 59

## 2013-06-08 VITALS — BP 122/84 | HR 86 | Resp 18 | Wt 160.0 lb

## 2013-06-08 DIAGNOSIS — R1011 Right upper quadrant pain: Secondary | ICD-10-CM | POA: Insufficient documentation

## 2013-06-08 DIAGNOSIS — G894 Chronic pain syndrome: Secondary | ICD-10-CM | POA: Insufficient documentation

## 2013-06-08 DIAGNOSIS — Z8709 Personal history of other diseases of the respiratory system: Secondary | ICD-10-CM | POA: Insufficient documentation

## 2013-06-08 DIAGNOSIS — Z87891 Personal history of nicotine dependence: Secondary | ICD-10-CM | POA: Insufficient documentation

## 2013-06-08 DIAGNOSIS — J45909 Unspecified asthma, uncomplicated: Secondary | ICD-10-CM | POA: Insufficient documentation

## 2013-06-08 DIAGNOSIS — Z86718 Personal history of other venous thrombosis and embolism: Secondary | ICD-10-CM | POA: Insufficient documentation

## 2013-06-08 DIAGNOSIS — K219 Gastro-esophageal reflux disease without esophagitis: Secondary | ICD-10-CM | POA: Insufficient documentation

## 2013-06-08 DIAGNOSIS — Z8639 Personal history of other endocrine, nutritional and metabolic disease: Secondary | ICD-10-CM | POA: Insufficient documentation

## 2013-06-08 DIAGNOSIS — Z8719 Personal history of other diseases of the digestive system: Secondary | ICD-10-CM | POA: Insufficient documentation

## 2013-06-08 DIAGNOSIS — Z872 Personal history of diseases of the skin and subcutaneous tissue: Secondary | ICD-10-CM | POA: Insufficient documentation

## 2013-06-08 DIAGNOSIS — Z862 Personal history of diseases of the blood and blood-forming organs and certain disorders involving the immune mechanism: Secondary | ICD-10-CM | POA: Insufficient documentation

## 2013-06-08 DIAGNOSIS — R109 Unspecified abdominal pain: Secondary | ICD-10-CM

## 2013-06-08 HISTORY — DX: Chronic pain syndrome: G89.4

## 2013-06-08 LAB — CBC WITH DIFFERENTIAL/PLATELET
Eosinophils Absolute: 0.2 10*3/uL (ref 0.0–0.7)
Eosinophils Relative: 3 % (ref 0–5)
HCT: 31.9 % — ABNORMAL LOW (ref 36.0–46.0)
Lymphocytes Relative: 23 % (ref 12–46)
Lymphs Abs: 1.3 10*3/uL (ref 0.7–4.0)
MCH: 30.3 pg (ref 26.0–34.0)
MCV: 87.2 fL (ref 78.0–100.0)
Monocytes Absolute: 0.5 10*3/uL (ref 0.1–1.0)
Platelets: 296 10*3/uL (ref 150–400)
RBC: 3.66 MIL/uL — ABNORMAL LOW (ref 3.87–5.11)
RDW: 13.5 % (ref 11.5–15.5)

## 2013-06-08 LAB — COMPREHENSIVE METABOLIC PANEL
Alkaline Phosphatase: 70 U/L (ref 39–117)
BUN: 7 mg/dL (ref 6–23)
CO2: 28 mEq/L (ref 19–32)
Calcium: 9.1 mg/dL (ref 8.4–10.5)
Creatinine, Ser: 0.97 mg/dL (ref 0.50–1.10)
Glucose, Bld: 82 mg/dL (ref 70–99)
Total Bilirubin: 0.5 mg/dL (ref 0.3–1.2)

## 2013-06-08 LAB — LIPASE, BLOOD: Lipase: 14 U/L (ref 11–59)

## 2013-06-08 MED ORDER — HYDROMORPHONE HCL PF 1 MG/ML IJ SOLN
1.0000 mg | Freq: Once | INTRAMUSCULAR | Status: AC
  Administered 2013-06-08: 1 mg via INTRAVENOUS
  Filled 2013-06-08: qty 1

## 2013-06-08 MED ORDER — IOHEXOL 300 MG/ML  SOLN
100.0000 mL | Freq: Once | INTRAMUSCULAR | Status: AC | PRN
  Administered 2013-06-08: 100 mL via INTRAVENOUS

## 2013-06-08 MED ORDER — HYDROMORPHONE HCL PF 1 MG/ML IJ SOLN: 1.0000 mg | Freq: Once | INTRAMUSCULAR | Status: AC

## 2013-06-08 MED ORDER — IOHEXOL 300 MG/ML  SOLN
50.0000 mL | Freq: Once | INTRAMUSCULAR | Status: AC | PRN
  Administered 2013-06-08: 50 mL via ORAL

## 2013-06-08 MED ORDER — ONDANSETRON HCL 4 MG/2ML IJ SOLN
4.0000 mg | Freq: Once | INTRAMUSCULAR | Status: AC
  Administered 2013-06-08: 4 mg via INTRAVENOUS
  Filled 2013-06-08: qty 2

## 2013-06-08 MED ORDER — HYDROMORPHONE HCL PF 1 MG/ML IJ SOLN
INTRAMUSCULAR | Status: AC
  Administered 2013-06-08: 1 mg via INTRAVENOUS
  Filled 2013-06-08: qty 1

## 2013-06-08 MED ORDER — OXYCODONE-ACETAMINOPHEN 5-325 MG PO TABS: 2.0000 | ORAL_TABLET | ORAL | Status: DC | PRN

## 2013-06-08 MED ORDER — ONDANSETRON HCL 8 MG PO TABS: 8.0000 mg | ORAL_TABLET | ORAL | Status: DC | PRN

## 2013-06-08 MED ORDER — SODIUM CHLORIDE 0.9 % IV BOLUS (SEPSIS)
1000.0000 mL | Freq: Once | INTRAVENOUS | Status: AC
  Administered 2013-06-08: 1000 mL via INTRAVENOUS

## 2013-06-08 NOTE — Assessment & Plan Note (Addendum)
Abdominal pain x 4 months, no pathology identified , with excessive ED visits and narcotic drug use. Referred to pain clinic Script for percocet 5/325 spt to collect 9/8 to last to 9/12  #10  To be colected on 9/8 and a copy will be sent to pain specialist also

## 2013-06-08 NOTE — ED Notes (Addendum)
Pt c/o of RUQ abdominal pain which radiates to back and right shoulder x 1 day. Pt reports gall bladder was removed in may but has been told that she will need a shunt in liver, scheduled for appointment September 18th.

## 2013-06-08 NOTE — Progress Notes (Signed)
  Subjective:    Patient ID: Jodi [name], female    DOB: Oct 29, 1975, 37 y.o.   MRN: [id number]  HPI Pt brought in to address issue of recurrent ED visits with c/o abdominal pain and the fact that when pharmacy was checked, she had received over 140 pain pills in [place] between the Ed and her orthopedic doc. Today when I contacted her to come into the offfice since I was  Aware of the prolem she answered from the ED there again, has had five ED visits at our local hospital since May with RUQ pain and she tells me she also was seen at [facility] ED . Had cholecystectomy in January , initially did well" but has been in uncontrolled pain since May. Also sprained ankle and was receiving pain meds as recently as July from orthopedics. Pt understands that this cannot continue. She is aware that I am not in any way stating she has no pain, but there is a definite concern of over use and abuse  of pain meds, and she did volunteer that her body had become "tolerant " after being on so much medication for so long She volunteered that "the dilaudid" in the Ed took away her pain She has a script for 25 percocet she received today , states that 2 per day will address her pain. I totally explained that I am concerned not only about her abdominal pain , and upcoming consultations have been arranged by her GI doc at tertiary centers for same, but that I am also very concerned about pain medication use/abuse /addiction, and so all pain management will be in hands of a pain specialist , who she has an appt with in September. I will prescribe the percocet at 2 daily she will need until that scheduled appt to start 12 days from today when current script from ED is expected to finish based not on prescribing directions , but on her stated need. Her father is present throughout visit, said nothing, neither he nor the patient were aggressive or defensive   Review of Systems     Objective:   Physical Exam        Assessment  & Plan:

## 2013-06-08 NOTE — ED Notes (Signed)
Pt father reported he had to leave but left his contact information for pt update. Pt gave verbal consent. Pt father,James Broadnax, contact information [phone].

## 2013-06-08 NOTE — Patient Instructions (Addendum)
F/u as before  Per the discussion today, the percocet script that you have will last for 12 days taking one tablet twice daily  After that is finished you may collect one script only of the same dose of percocet  Twice daily to last until the first day that you see Dr Gerilyn Pilgrim for management of chronic pain   If you have unbearable pain , this will need evaluation in the emergency room  You should not get additional pain medication from any provider other than in the emergency room (if you need to go) until you see Dr. Gerilyn Pilgrim

## 2013-06-08 NOTE — Telephone Encounter (Signed)
Faxed over papers to Dr. Harriett Sine and patient is aware of her with with the Dr. Gerilyn Pilgrim

## 2013-06-08 NOTE — ED Provider Notes (Signed)
CSN: [id number]     Arrival date & time 06/08/13  0708 History  This chart was scribed for Donnetta Hutching, MD by Blanchard Kelch, ED Scribe. The patient was seen in room APA03/APA03. Patient's care was started at 7:35 AM.     Chief Complaint  Patient presents with  . Abdominal Pain    Patient is a 37 y.o. female presenting with abdominal pain. The history is provided by the patient. No language interpreter was used.  Abdominal Pain   HPI Comments: Jodi [name] is a 37 y.o. female who presents to the Emergency Department complaining of intermittent right upper quadrant abdominal pain that began a few months ago in May but has recently worsened. Patient has a past surgical history of cholecystectomy in January 2014. She has lost 20 lbs in the past few months due to associated difficulty eating from the pain. She has been using OTC pain medications with mild relief. The patient saw Dr. Karilyn Cota locally and was told she needs a stent in her common bile duct and was referred to a specialist at [facility] for an appointment on 06/30/13.  PCP is Dr. Lodema Hong.    Past Medical History  Diagnosis Date  . Asthma   . DVT (deep venous thrombosis) 10 yrs ago  . Asthma   . Allergic rhinitis   . Hyperlipidemia   . Shortness of breath     SOB even without exertion at times  . GERD (gastroesophageal reflux disease)   . IBS (irritable bowel syndrome)   . 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 Anemia   . Eczema   . Abdominal pain, chronic, right upper quadrant    Past Surgical History  Procedure Laterality Date  . Blood clot removed from lower abdomen  2000  . Cyst removed from ovary and fluid removed from tubes  2000  . Partial hysterectomy  2009  . Esophagogastroduodenoscopy  10/22/2012    Procedure: ESOPHAGOGASTRODUODENOSCOPY (EGD);  Surgeon: Malissa Hippo, MD;  Location: [facility];  Service: Endoscopy;  Laterality: N/A;  100  . Colonoscopy  11/04/2012    Procedure: COLONOSCOPY;   Surgeon: Malissa Hippo, MD;  Location: [facility];  Service: Endoscopy;  Laterality: N/A;  915  . Abdominal hysterectomy  4-5 yrs ago  . Tubal ligation  1999  . Cholecystectomy  11/12/2012    Procedure: LAPAROSCOPIC CHOLECYSTECTOMY;  Surgeon: Dalia Heading, MD;  Location: [facility];  Service: General;  Laterality: N/A;   Family History  Problem Relation Age of Onset  . Asthma Mother   . Hypertension Mother   . Bronchiolitis Mother   . Hypertension Father   . Hyperlipidemia Father     recurrent rectum polyps   . Diabetes Sister   . Lupus Sister   . Depression Sister    History  Substance Use Topics  . Smoking status: Former Smoker -- 0.25 packs/day for 10 years    Types: Cigarettes    Quit date: 10/13/2008  . Smokeless tobacco: Never Used  . Alcohol Use: No   OB History   Grav Para Term Preterm Abortions TAB SAB Ect Mult Living                 Review of Systems: A complete 10 system review of systems was obtained and all systems are negative except as noted in the HPI and PMH.    Allergies  Compazine  Home Medications   Current Outpatient Rx  Name  Route  Sig  Dispense  Refill  . albuterol (PROVENTIL HFA;VENTOLIN HFA) 108 (90 BASE) MCG/ACT inhaler   Inhalation   Inhale 2 puffs into the lungs every 4 (four) hours as needed for wheezing or shortness of breath.         Marland Kitchen albuterol (PROVENTIL) (2.5 MG/3ML) 0.083% nebulizer solution   Nebulization   Take 2.5 mg by nebulization every 4 (four) hours as needed for wheezing or shortness of breath.         . dicyclomine (BENTYL) 10 MG capsule   Oral   Take 2 capsules (20 mg total) by mouth 3 (three) times daily before meals.   90 capsule   2   . fluticasone (FLONASE) 50 MCG/ACT nasal spray   Nasal   Place 2 sprays into the nose daily.   16 g   4   . fluticasone-salmeterol (ADVAIR HFA) 230-21 MCG/ACT inhaler   Inhalation   Inhale 2 puffs into the lungs 2 (two) times daily.   1 Inhaler   12   .  HYDROcodone-acetaminophen (NORCO/VICODIN) 5-325 MG per tablet   Oral   Take 1 tablet by mouth every 6 (six) hours as needed for pain.         Marland Kitchen levocetirizine (XYZAL) 5 MG tablet   Oral   Take 1 tablet (5 mg total) by mouth every evening.   30 tablet   4   . montelukast (SINGULAIR) 10 MG tablet   Oral   Take 1 tablet (10 mg total) by mouth every morning.   30 tablet   5   . omeprazole (PRILOSEC) 20 MG capsule   Oral   Take 1 capsule (20 mg total) by mouth daily.   30 capsule   5   . ondansetron (ZOFRAN) 4 MG tablet   Oral   Take 1 tablet (4 mg total) by mouth every 8 (eight) hours as needed for nausea.   8 tablet   0   . zolpidem (AMBIEN CR) 6.25 MG CR tablet   Oral   Take 1 tablet (6.25 mg total) by mouth at bedtime as needed for sleep.   30 tablet   1    Triage Vitals: BP 158/89  Pulse 99  Temp(Src) 98.2 F (36.8 C) (Oral)  Resp 20  SpO2 100%  LMP 05/17/2012  Physical Exam  Nursing note and vitals reviewed. Constitutional: She is oriented to person, place, and time. She appears well-developed and well-nourished.  HENT:  Head: Normocephalic and atraumatic.  Eyes: Conjunctivae and EOM are normal. Pupils are equal, round, and reactive to light.  Neck: Normal range of motion. Neck supple.  Cardiovascular: Normal rate, regular rhythm and normal heart sounds.   Pulmonary/Chest: Effort normal and breath sounds normal.  Abdominal: Bowel sounds are normal. There is tenderness (right upper quadrant).  Musculoskeletal: Normal range of motion.  Neurological: She is alert and oriented to person, place, and time.  Skin: Skin is warm and dry.  Psychiatric: She has a normal mood and affect.    ED Course  Procedures (including critical care time)  DIAGNOSTIC STUDIES:  Oxygen Saturation is 100% on room air, normal by my interpretation.    COORDINATION OF CARE:  7:35 AM - Will order Dilaudid, Zofran, IV fluids and check labs.  Patient verbalizes understanding and  agrees with treatment plan.  Results for orders placed during the hospital encounter of 06/08/13  COMPREHENSIVE METABOLIC PANEL      Result Value Range   Sodium 138  135 - 145 mEq/L   Potassium 3.8  3.5 - 5.1 mEq/L   Chloride 102  96 - 112 mEq/L   CO2 28  19 - 32 mEq/L   Glucose, Bld 82  70 - 99 mg/dL   BUN 7  6 - 23 mg/dL   Creatinine, Ser 1.61  0.50 - 1.10 mg/dL   Calcium 9.1  8.4 - 09.6 mg/dL   Total Protein 6.5  6.0 - 8.3 g/dL   Albumin 3.7  3.5 - 5.2 g/dL   AST 19  0 - 37 U/L   ALT 14  0 - 35 U/L   Alkaline Phosphatase 70  39 - 117 U/L   Total Bilirubin 0.5  0.3 - 1.2 mg/dL   GFR calc non Af Amer 73 (*) >90 mL/min   GFR calc Af Amer 85 (*) >90 mL/min  CBC WITH DIFFERENTIAL      Result Value Range   WBC 5.5  4.0 - 10.5 K/uL   RBC 3.66 (*) 3.87 - 5.11 MIL/uL   Hemoglobin 11.1 (*) 12.0 - 15.0 g/dL   HCT 04.5 (*) 40.9 - 81.1 %   MCV 87.2  78.0 - 100.0 fL   MCH 30.3  26.0 - 34.0 pg   MCHC 34.8  30.0 - 36.0 g/dL   RDW 91.4  78.2 - 95.6 %   Platelets 296  150 - 400 K/uL   Neutrophils Relative % 64  43 - 77 %   Neutro Abs 3.5  1.7 - 7.7 K/uL   Lymphocytes Relative 23  12 - 46 %   Lymphs Abs 1.3  0.7 - 4.0 K/uL   Monocytes Relative 10  3 - 12 %   Monocytes Absolute 0.5  0.1 - 1.0 K/uL   Eosinophils Relative 3  0 - 5 %   Eosinophils Absolute 0.2  0.0 - 0.7 K/uL   Basophils Relative 0  0 - 1 %   Basophils Absolute 0.0  0.0 - 0.1 K/uL  LIPASE, BLOOD      Result Value Range   Lipase 14  11 - 59 U/L    Results for orders placed during the hospital encounter of 06/03/13  URINALYSIS, ROUTINE W REFLEX MICROSCOPIC      Result Value Range   Color, Urine YELLOW  YELLOW   APPearance CLEAR  CLEAR   Specific Gravity, Urine 1.020  1.005 - 1.030   pH 6.0  5.0 - 8.0   Glucose, UA NEGATIVE  NEGATIVE mg/dL   Hgb urine dipstick NEGATIVE  NEGATIVE   Bilirubin Urine NEGATIVE  NEGATIVE   Ketones, ur NEGATIVE  NEGATIVE mg/dL   Protein, ur NEGATIVE  NEGATIVE mg/dL   Urobilinogen, UA 0.2   0.0 - 1.0 mg/dL   Nitrite NEGATIVE  NEGATIVE   Leukocytes, UA NEGATIVE  NEGATIVE   Results for orders placed during the hospital encounter of 06/03/13  URINALYSIS, ROUTINE W REFLEX MICROSCOPIC      Result Value Range   Color, Urine YELLOW  YELLOW   APPearance CLEAR  CLEAR   Specific Gravity, Urine 1.020  1.005 - 1.030   pH 6.0  5.0 - 8.0   Glucose, UA NEGATIVE  NEGATIVE mg/dL   Hgb urine dipstick NEGATIVE  NEGATIVE   Bilirubin Urine NEGATIVE  NEGATIVE   Ketones, ur NEGATIVE  NEGATIVE mg/dL   Protein, ur NEGATIVE  NEGATIVE mg/dL   Urobilinogen, UA 0.2  0.0 - 1.0 mg/dL   Nitrite NEGATIVE  NEGATIVE   Leukocytes, UA NEGATIVE  NEGATIVE        Labs Review Labs Reviewed  COMPREHENSIVE METABOLIC PANEL - Abnormal; Notable for the following:    GFR calc non Af Amer 73 (*)    GFR calc Af Amer 85 (*)    All other components within normal limits  CBC WITH DIFFERENTIAL - Abnormal; Notable for the following:    RBC 3.66 (*)    Hemoglobin 11.1 (*)    HCT 31.9 (*)    All other components within normal limits  LIPASE, BLOOD    Ct Abdomen Pelvis W Contrast  06/08/2013   *RADIOLOGY REPORT*  Clinical Data: Severe abdominal pain.  CT ABDOMEN AND PELVIS WITH CONTRAST  Technique:  Multidetector CT imaging of the abdomen and pelvis was performed following the standard protocol during bolus administration of intravenous contrast.  Contrast: 50mL OMNIPAQUE IOHEXOL 300 MG/ML  SOLN, OMNIPAQUE IOHEXOL 300 MG/ML  SOLN  Comparison: Chest two views abdomen 05/19/2013 and CT abdomen and pelvis 04/28/2013.  Findings: The lung bases are clear.  No pleural or pericardial effusion.  Tiny low attenuating lesion in the upper pole right kidney compatible with a cyst is unchanged.  The kidneys are otherwise unremarkable.  The spleen, adrenal glands and pancreas appear normal.  The patient is status post cholecystectomy.  Scattered, small hepatic cysts are again identified.  The biliary tree is unremarkable.   The appendix is well visualized and appears normal. The patient is status post hysterectomy.  Small cystic lesions in the ovaries, more prompt on the left, are most consistent with functional cysts. The stomach and small and large bowel appear normal.  No bony abnormality identified.  IMPRESSION: No acute finding or finding to explain the patient's symptoms. Stable compared to prior exam.   Original Report Authenticated By: Holley Dexter, M.D.   Imaging Review No results found.  MDM  No diagnosis found. No acute abdomen. CT scan negative. White count and hepatic functions all normal. Discharge meds Percocet and Phenergan 25 mg. Patient has followup at [facility] on September 18   I personally performed the services described in this documentation, which was scribed in my presence. The recorded information has been reviewed and is accurate.    Donnetta Hutching, MD 06/08/13 [phone]

## 2013-06-08 NOTE — ED Notes (Addendum)
Pt given discharge instructions and verbalized understanding. NAD at this time. Vital signs were WNL.

## 2013-06-08 NOTE — Telephone Encounter (Signed)
Spoke with patient and she is aware  

## 2013-06-08 NOTE — Assessment & Plan Note (Signed)
-   Upcoming appt with GI

## 2013-06-09 ENCOUNTER — Ambulatory Visit: Payer: Managed Care, Other (non HMO) | Admitting: Family Medicine

## 2013-06-09 ENCOUNTER — Other Ambulatory Visit: Payer: Self-pay | Admitting: Family Medicine

## 2013-06-09 MED ORDER — OXYCODONE-ACETAMINOPHEN 5-325 MG PO TABS: ORAL_TABLET | ORAL | Status: AC

## 2013-06-09 NOTE — Telephone Encounter (Signed)
Noted pt will get percocet script to last till appt 09/12, when current from [facility] ED is finished

## 2013-06-09 NOTE — Telephone Encounter (Signed)
pls contact pt and verify her appt time and date with Dr Gerilyn Pilgrim for pain management.Also pls explain that she will collect the script from here on 09/08 to last the 5 days until she sees Dr Gerilyn Pilgrim as we discussed yesterday at the visit t

## 2013-06-09 NOTE — Telephone Encounter (Signed)
Pt seen in office yesterday with her father present and pain management concerns discussed and addressed, documented plan put in place

## 2013-06-09 NOTE — Telephone Encounter (Signed)
Patient aware to collect script on 9/8 and letter faxed to Dr. Gerilyn Pilgrim.

## 2013-06-09 NOTE — Telephone Encounter (Signed)
pls also fax letter to Dr Ronal Fear attention with a copy of script form here and the printoutthat we have

## 2013-06-27 ENCOUNTER — Telehealth: Payer: Self-pay | Admitting: Family Medicine

## 2013-06-27 NOTE — Telephone Encounter (Signed)
noted 

## 2013-06-29 ENCOUNTER — Other Ambulatory Visit: Payer: Self-pay | Admitting: Family Medicine

## 2013-06-29 ENCOUNTER — Telehealth: Payer: Self-pay | Admitting: Family Medicine

## 2013-06-29 NOTE — Telephone Encounter (Signed)
Pls write on letterhead and send in to pt's pharmacy, stamped d/c refill order on zolpidem ER 6.25mg  originally written on 05/24/2013. I am notifying [facility] that this is being refilled in response to info sent to me on the pt Pls let pharmacy no not to refill also

## 2013-07-03 ENCOUNTER — Emergency Department (HOSPITAL_COMMUNITY)
Admission: EM | Admit: 2013-07-03 | Discharge: 2013-07-03 | Disposition: A | Discharge status: Home / Self Care | Payer: 59 | Attending: Emergency Medicine | Admitting: Emergency Medicine

## 2013-07-03 ENCOUNTER — Encounter (HOSPITAL_COMMUNITY): Payer: Self-pay | Admitting: *Deleted

## 2013-07-03 DIAGNOSIS — R112 Nausea with vomiting, unspecified: Secondary | ICD-10-CM | POA: Insufficient documentation

## 2013-07-03 DIAGNOSIS — Z87891 Personal history of nicotine dependence: Secondary | ICD-10-CM | POA: Insufficient documentation

## 2013-07-03 DIAGNOSIS — IMO0002 Reserved for concepts with insufficient information to code with codable children: Secondary | ICD-10-CM | POA: Insufficient documentation

## 2013-07-03 DIAGNOSIS — Z79899 Other long term (current) drug therapy: Secondary | ICD-10-CM | POA: Insufficient documentation

## 2013-07-03 DIAGNOSIS — Z90710 Acquired absence of both cervix and uterus: Secondary | ICD-10-CM | POA: Insufficient documentation

## 2013-07-03 DIAGNOSIS — R1011 Right upper quadrant pain: Secondary | ICD-10-CM | POA: Insufficient documentation

## 2013-07-03 DIAGNOSIS — Z86718 Personal history of other venous thrombosis and embolism: Secondary | ICD-10-CM | POA: Insufficient documentation

## 2013-07-03 DIAGNOSIS — K219 Gastro-esophageal reflux disease without esophagitis: Secondary | ICD-10-CM | POA: Insufficient documentation

## 2013-07-03 DIAGNOSIS — G8929 Other chronic pain: Secondary | ICD-10-CM

## 2013-07-03 DIAGNOSIS — D649 Anemia, unspecified: Secondary | ICD-10-CM | POA: Insufficient documentation

## 2013-07-03 DIAGNOSIS — J45909 Unspecified asthma, uncomplicated: Secondary | ICD-10-CM | POA: Insufficient documentation

## 2013-07-03 DIAGNOSIS — Z872 Personal history of diseases of the skin and subcutaneous tissue: Secondary | ICD-10-CM | POA: Insufficient documentation

## 2013-07-03 DIAGNOSIS — Z862 Personal history of diseases of the blood and blood-forming organs and certain disorders involving the immune mechanism: Secondary | ICD-10-CM | POA: Insufficient documentation

## 2013-07-03 DIAGNOSIS — Z8639 Personal history of other endocrine, nutritional and metabolic disease: Secondary | ICD-10-CM | POA: Insufficient documentation

## 2013-07-03 LAB — URINALYSIS, ROUTINE W REFLEX MICROSCOPIC
Bilirubin Urine: NEGATIVE
Hgb urine dipstick: NEGATIVE
Ketones, ur: 15 mg/dL — AB
Nitrite: NEGATIVE
Specific Gravity, Urine: 1.01 (ref 1.005–1.030)
pH: 6 (ref 5.0–8.0)

## 2013-07-03 LAB — CBC WITH DIFFERENTIAL/PLATELET
Basophils Relative: 1 % (ref 0–1)
Eosinophils Absolute: 0.1 10*3/uL (ref 0.0–0.7)
Eosinophils Relative: 2 % (ref 0–5)
HCT: 32.5 % — ABNORMAL LOW (ref 36.0–46.0)
Hemoglobin: 11.5 g/dL — ABNORMAL LOW (ref 12.0–15.0)
Lymphs Abs: 2.1 10*3/uL (ref 0.7–4.0)
MCH: 30.7 pg (ref 26.0–34.0)
MCHC: 35.4 g/dL (ref 30.0–36.0)
MCV: 86.7 fL (ref 78.0–100.0)
Monocytes Absolute: 0.6 10*3/uL (ref 0.1–1.0)
Monocytes Relative: 9 % (ref 3–12)
Neutrophils Relative %: 54 % (ref 43–77)
RBC: 3.75 MIL/uL — ABNORMAL LOW (ref 3.87–5.11)

## 2013-07-03 LAB — COMPREHENSIVE METABOLIC PANEL
Albumin: 4.2 g/dL (ref 3.5–5.2)
Alkaline Phosphatase: 76 U/L (ref 39–117)
BUN: 7 mg/dL (ref 6–23)
Calcium: 9.5 mg/dL (ref 8.4–10.5)
Creatinine, Ser: 1.09 mg/dL (ref 0.50–1.10)
GFR calc Af Amer: 74 mL/min — ABNORMAL LOW (ref >90)
Glucose, Bld: 85 mg/dL (ref 70–99)
Potassium: 3 mEq/L — ABNORMAL LOW (ref 3.5–5.1)
Total Protein: 6.7 g/dL (ref 6.0–8.3)

## 2013-07-03 LAB — LIPASE, BLOOD: Lipase: 16 U/L (ref 11–59)

## 2013-07-03 MED ORDER — FENTANYL CITRATE 0.05 MG/ML IJ SOLN
50.0000 ug | Freq: Once | INTRAMUSCULAR | Status: AC
  Administered 2013-07-03: 50 ug via INTRAVENOUS
  Filled 2013-07-03: qty 2

## 2013-07-03 MED ORDER — ONDANSETRON HCL 4 MG/2ML IJ SOLN
4.0000 mg | Freq: Once | INTRAMUSCULAR | Status: AC
  Administered 2013-07-03: 4 mg via INTRAVENOUS
  Filled 2013-07-03: qty 2

## 2013-07-03 MED ORDER — FENTANYL CITRATE 0.05 MG/ML IJ SOLN
100.0000 ug | Freq: Once | INTRAMUSCULAR | Status: AC
  Administered 2013-07-03: 100 ug via INTRAVENOUS
  Filled 2013-07-03: qty 2

## 2013-07-03 MED ORDER — SODIUM CHLORIDE 0.9 % IV BOLUS (SEPSIS)
1000.0000 mL | Freq: Once | INTRAVENOUS | Status: AC
  Administered 2013-07-03: 1000 mL via INTRAVENOUS

## 2013-07-03 MED ORDER — POTASSIUM CHLORIDE CRYS ER 20 MEQ PO TBCR
40.0000 meq | EXTENDED_RELEASE_TABLET | Freq: Once | ORAL | Status: AC
  Administered 2013-07-03: 40 meq via ORAL
  Filled 2013-07-03: qty 2

## 2013-07-03 NOTE — ED Notes (Signed)
Pt c/o ruq pain and n/v. Pt has had this problem since May 2014 and has seen numerous doctors. Pt says that she believes the food she ate today has caused her pain.

## 2013-07-03 NOTE — ED Notes (Signed)
Patient complaining of increasing right upper quadrant pain. Dr. Fonnie Jarvis notified. Order placed for medication.

## 2013-07-03 NOTE — ED Provider Notes (Signed)
CSN: [id number]     Arrival date & time 07/03/13  1901 History  This chart was scribed for Jodi Horn, MD by Ardelia Mems, ED Scribe. This patient was seen in room APA05/APA05 and the patient's care was started at 8:38 PM.  Chief Complaint  Patient presents with  . Abdominal Pain  . Nausea  . Emesis    The history is provided by the patient. No language interpreter was used.    HPI Comments: Jodi [name] is a 37 y.o. female who presents to the Emergency Department complaining of exacerbation of her chronic RUQ abdominal pain over the past few days. She states that she had about 10 episodes of associated diarrhea yesterday, and she has had about 2 episodes today, all without blood. She states that having diarrhea, caused a temporary improvement in her pain. She also reports associated nausea with multiple episodes of emesis onset today. She states that she has been eating less than usual the past 5 days. She states that her abdominal pain has been present daily for the past 6-7 months, and that some days the pain acutely and severely worsens. She states that her pain onset a few months after having a cholecystectomy on 11/12/2012. She also has a history of hysterectomy. She has been on narcotics daily for chronic RUQ abdominal pain, but states that she has not been taking Narcotics for the past 5 days, because she wants to stop taking narcotics. She states that she took Ibuprofen only today, without relief. Pt states that she was seen at [facility] 3 days ago for the same, and was she was scheduled for a gastric emptying test on 08/05/13. She has visited the ED multiple times for the same symptoms. She has had Multiple CT scans and [place] test performed to evaluate this chronic pain. She denies fever, confusion, vaginal bleeding, dysuria or any other symptoms.  PCP- Dr. Syliva Overman  Past Medical History  Diagnosis Date  . Asthma   . DVT (deep venous thrombosis) 10 yrs ago  . Asthma   .  Allergic rhinitis   . Hyperlipidemia   . Shortness of breath     SOB even without exertion at times  . GERD (gastroesophageal reflux disease)   . IBS (irritable bowel syndrome)   . 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 Anemia   . Eczema   . Abdominal pain, chronic, right upper quadrant    Past Surgical History  Procedure Laterality Date  . Blood clot removed from lower abdomen  2000  . Cyst removed from ovary and fluid removed from tubes  2000  . Partial hysterectomy  2009  . Esophagogastroduodenoscopy  10/22/2012    Procedure: ESOPHAGOGASTRODUODENOSCOPY (EGD);  Surgeon: Malissa Hippo, MD;  Location: [facility];  Service: Endoscopy;  Laterality: N/A;  100  . Colonoscopy  11/04/2012    Procedure: COLONOSCOPY;  Surgeon: Malissa Hippo, MD;  Location: [facility];  Service: Endoscopy;  Laterality: N/A;  915  . Abdominal hysterectomy  4-5 yrs ago  . Tubal ligation  1999  . Cholecystectomy  11/12/2012    Procedure: LAPAROSCOPIC CHOLECYSTECTOMY;  Surgeon: Dalia Heading, MD;  Location: [facility];  Service: General;  Laterality: N/A;   Family History  Problem Relation Age of Onset  . Asthma Mother   . Hypertension Mother   . Bronchiolitis Mother   . Hypertension Father   . Hyperlipidemia Father     recurrent rectum polyps   .  Diabetes Sister   . Lupus Sister   . Depression Sister    History  Substance Use Topics  . Smoking status: Former Smoker -- 0.25 packs/day for 10 years    Types: Cigarettes    Quit date: 10/13/2008  . Smokeless tobacco: Never Used  . Alcohol Use: No   OB History   Grav Para Term Preterm Abortions TAB SAB Ect Mult Living                 Review of Systems 10 Systems reviewed and are negative for acute change except as noted in the HPI.  Allergies  Compazine  Home Medications   Current Outpatient Rx  Name  Route  Sig  Dispense  Refill  . dicyclomine (BENTYL) 10 MG capsule   Oral   Take 20 mg by mouth 4 (four) times daily -  before  meals and at bedtime.          . fluticasone (FLONASE) 50 MCG/ACT nasal spray   Nasal   Place 2 sprays into the nose daily.   16 g   4   . fluticasone-salmeterol (ADVAIR HFA) 230-21 MCG/ACT inhaler   Inhalation   Inhale 2 puffs into the lungs 2 (two) times daily.   1 Inhaler   12   . ibuprofen (ADVIL,MOTRIN) 400 MG tablet   Oral   Take 400 mg by mouth every 6 (six) hours as needed for pain.         Marland Kitchen levocetirizine (XYZAL) 5 MG tablet   Oral   Take 1 tablet (5 mg total) by mouth every evening.   30 tablet   4   . montelukast (SINGULAIR) 10 MG tablet   Oral   Take 1 tablet (10 mg total) by mouth every morning.   30 tablet   5   . Multiple Vitamins-Minerals (ONE DAILY FOR WOMEN) TABS   Oral   Take 1 tablet by mouth every morning.         Marland Kitchen omeprazole (PRILOSEC) 20 MG capsule   Oral   Take 1 capsule (20 mg total) by mouth daily.   30 capsule   5   . ondansetron (ZOFRAN) 8 MG tablet   Oral   Take 1 tablet (8 mg total) by mouth every 4 (four) hours as needed for nausea.   15 tablet   0   . oxyCODONE-acetaminophen (PERCOCET) 5-325 MG per tablet   Oral   Take 2 tablets by mouth every 4 (four) hours as needed for pain.   25 tablet   0   . zolpidem (AMBIEN CR) 6.25 MG CR tablet   Oral   Take 6.25 mg by mouth at bedtime as needed for sleep.         Marland Kitchen albuterol (PROVENTIL HFA;VENTOLIN HFA) 108 (90 BASE) MCG/ACT inhaler   Inhalation   Inhale 2 puffs into the lungs every 4 (four) hours as needed for wheezing or shortness of breath.         Marland Kitchen albuterol (PROVENTIL) (2.5 MG/3ML) 0.083% nebulizer solution   Nebulization   Take 2.5 mg by nebulization every 4 (four) hours as needed for wheezing or shortness of breath.          Triage Vitals: BP 141/85  Pulse 86  Temp(Src) 98.6 F (37 C) (Oral)  Resp 20  Ht 5\' 6"  (1.676 m)  Wt 154 lb (69.854 kg)  BMI 24.87 kg/m2  SpO2 100%  LMP 05/17/2012  Physical Exam  Nursing note and  vitals  reviewed. Constitutional:  Awake, alert, nontoxic appearance.  HENT:  Head: Atraumatic.  Eyes: Right eye exhibits no discharge. Left eye exhibits no discharge.  Neck: Neck supple.  Cardiovascular: Normal rate, regular rhythm and normal heart sounds.   No murmur heard. Pulmonary/Chest: Effort normal and breath sounds normal. No respiratory distress. She exhibits no tenderness.  Abdominal: Soft. Bowel sounds are normal. She exhibits no mass. There is tenderness (Moderate RUQ only). There is no rebound.  Musculoskeletal: She exhibits no tenderness.  Baseline ROM, no obvious new focal weakness.  Neurological:  Mental status and motor strength appears baseline for patient and situation.  Skin: No rash noted.  Psychiatric: She has a normal mood and affect.    ED Course  Procedures (including critical care time)  DIAGNOSTIC STUDIES: Oxygen Saturation is 100% on RA, normal by my interpretation.    COORDINATION OF CARE: 8:43 PM- Discussed plan for pt to receive diagnostic lab work and medications in the ED. Pt agrees. Pt feels improved and ready for discharge after observation and/or treatment in ED.2305 I doubt any other EMC precluding discharge at this time including, but not necessarily limited to the following:SBI.  Labs Review Labs Reviewed  URINALYSIS, ROUTINE W REFLEX MICROSCOPIC - Abnormal; Notable for the following:    Ketones, ur 15 (*)    All other components within normal limits  CBC WITH DIFFERENTIAL - Abnormal; Notable for the following:    RBC 3.75 (*)    Hemoglobin 11.5 (*)    HCT 32.5 (*)    All other components within normal limits  COMPREHENSIVE METABOLIC PANEL - Abnormal; Notable for the following:    Potassium 3.0 (*)    GFR calc non Af Amer 64 (*)    GFR calc Af Amer 74 (*)    All other components within normal limits  LIPASE, BLOOD   Imaging Review No results found.  MDM   1. Chronic abdominal pain    I personally performed the services described in  this documentation, which was scribed in my presence. The recorded information has been reviewed and is accurate.     Jodi Horn, MD 07/04/13 2242

## 2013-07-07 ENCOUNTER — Other Ambulatory Visit: Payer: Self-pay

## 2013-07-07 NOTE — Telephone Encounter (Signed)
Discontinue notice sent.

## 2013-07-08 ENCOUNTER — Ambulatory Visit: Payer: 59 | Admitting: Family Medicine

## 2013-07-28 ENCOUNTER — Ambulatory Visit (INDEPENDENT_AMBULATORY_CARE_PROVIDER_SITE_OTHER): Payer: 59 | Admitting: Family Medicine

## 2013-07-28 ENCOUNTER — Encounter: Payer: Self-pay | Admitting: Family Medicine

## 2013-07-28 VITALS — BP 120/80 | HR 91 | Resp 16 | Ht 66.0 in | Wt 155.4 lb

## 2013-07-28 DIAGNOSIS — R1011 Right upper quadrant pain: Secondary | ICD-10-CM

## 2013-07-28 DIAGNOSIS — G4700 Insomnia, unspecified: Secondary | ICD-10-CM

## 2013-07-28 DIAGNOSIS — F32A Depression, unspecified: Secondary | ICD-10-CM

## 2013-07-28 DIAGNOSIS — J45909 Unspecified asthma, uncomplicated: Secondary | ICD-10-CM

## 2013-07-28 DIAGNOSIS — Z23 Encounter for immunization: Secondary | ICD-10-CM

## 2013-07-28 DIAGNOSIS — F329 Major depressive disorder, single episode, unspecified: Secondary | ICD-10-CM

## 2013-07-28 DIAGNOSIS — J309 Allergic rhinitis, unspecified: Secondary | ICD-10-CM

## 2013-07-28 MED ORDER — FLUTICASONE PROPIONATE 50 MCG/ACT NA SUSP: 2.0000 | Freq: Every day | NASAL | Status: DC

## 2013-07-28 MED ORDER — MIRTAZAPINE 15 MG PO TABS: 15.0000 mg | ORAL_TABLET | Freq: Every day | ORAL | Status: DC

## 2013-07-28 NOTE — Progress Notes (Signed)
Subjective:    Patient ID: Jodi [name], female    DOB: 10-01-1976, 37 y.o.   MRN: [id number]  HPI Pt here for f/u of abdominal pain. She has her 2nd appointment at [facility] today, gastric emptying study done 2 weeks, ago was normal, Dr Margaretha Glassing is her GI Doc there, Future plan as far as further testing/management will be known when she goes for her visit later today.. First visit at [facility] was Sept 25, study was 07/15/2013 , follow up is tomorrow. Stated that when ` Doc had her in for the visit , he didn't seem to be listening to her history, did an upper endoscopy, and then arranged for the emptying study. So up to this point there is still no answer as to why she continues to experience daily disabling abdominal pain States she has a dull pain all day RUQ rated at a 3, eating, activity, as minimal as cleaning her home even will cause the pain to go  up to a 7 , currently using muscle relaxant for pain relief, her main concern remains addressing  the underlying problem.She states that when reviewing her information in "my chart" there appeared to have been growth in hepatic cysts and directly asked me how significant I though that was. I looked a t the  Written reports , and stated I could not tell her for sure , and suggested she review this with the GI Doc who is seeing her currently. Her [facility] appt Dr Nathanial Millman is scheduled for Sep 01, 2013. She has been able to schedule appts at [facility] Oct 29 and 30 with hepatologist, Dr Selena Batten on the 29th, and GI Dr Chales Abrahams, this was arranged with the help of a cousin who works at the hospital. States she is frustrated as no one is able to find out why she hurts every day as she does. States she wants her life back. States she has been and still continues to be unable to work due to her abdominal pain, states she wants to work, to ride her horses, and live a normal life , and becomes tearful. States her appetite and sleep are poor, and she does admit to being  depressed , because of her chronic pain and loss of her "normal life" States she is only using muscle relaxants through the pain clinic for pain, and though not fully relieved, she can tolerate the pain .Emphasizes the fact that her concern is to deal with the cause of her pain.Denies change in bowel movements.     Review of Systems See HPI Denies recent fever or chills. Denies sinus pressure, nasal congestion, ear pain or sore throat.Recently had clear nasal drainage with sinus pressure which has resolved Denies chest congestion, productive cough or wheezing.Asthma stable, needs flu vaccine Denies chest pains, palpitations and leg swelling    Denies dysuria, frequency, hesitancy or incontinence. Denies joint pain, swelling and limitation in mobility. Denies headaches, seizures, numbness, or tingling.  Denies skin break down or rash.        Objective:   Physical Exam Patient alert and oriented and in no cardiopulmonary distress.  HEENT: No facial asymmetry, EOMI, no sinus tenderness,  oropharynx pink and moist.  Neck supple no adenopathy.  Chest: Clear to auscultation bilaterally.  CVS: S1, S2 no murmurs, no S3.  ABD: Soft, superficial  point tenderness in RUQ , localized by patient as an incision site, no guarding or rebound elicited. No palpable organomegaly or masses. Normal BS Ext: No edema  MS: Adequate ROM spine, shoulders, hips and knees.  Skin: Intact, no ulcerations or rash noted.  Psych: Good eye contact, at times , tearful  affect. Memory intact  anxious at times and mildly  depressed appearing.  CNS: CN 2-12 intact, power, tone and sensation normal throughout.        Assessment & Plan:

## 2013-07-28 NOTE — Patient Instructions (Addendum)
F/u Decmber 5, 2014 call if you need me before   New medication to help with sleep , and depression from chronic illness is remeron 15 mg one at bedtime  I hope the cause of your pain is determined soon  Work excuse until Jan6, 2015

## 2013-07-30 DIAGNOSIS — F329 Major depressive disorder, single episode, unspecified: Secondary | ICD-10-CM | POA: Insufficient documentation

## 2013-07-30 DIAGNOSIS — F32A Depression, unspecified: Secondary | ICD-10-CM | POA: Insufficient documentation

## 2013-07-30 NOTE — Assessment & Plan Note (Addendum)
Ongoing RUQ pain rated at a 7 with minimal activity, in area of cholecystectomy performed in March 2014. Patient has evaluations at 2 tertiary centers in [facility] and also has arranged for out of state evaluation at [facility] , all should be completed by the end of December, hopefully. Evaluation at [facility] has found no new pathology to explain her symptoms per pt report Needs extension to her work note at this time.Will assume and hope that she is able to return to work early in January, note will be signed off to that effect

## 2013-07-30 NOTE — Assessment & Plan Note (Signed)
Pt to start medication to treat depression. Will be offered therapy as well, I believe she will benefit from this

## 2013-07-30 NOTE — Assessment & Plan Note (Signed)
Stable and controlled on current medication Flu vaccine administered

## 2013-08-04 ENCOUNTER — Other Ambulatory Visit: Payer: Self-pay

## 2013-08-04 MED ORDER — LEVOCETIRIZINE DIHYDROCHLORIDE 5 MG PO TABS: 5.0000 mg | ORAL_TABLET | Freq: Every evening | ORAL | Status: DC

## 2013-09-16 ENCOUNTER — Ambulatory Visit: Payer: 59 | Admitting: Family Medicine

## 2013-10-11 ENCOUNTER — Encounter: Payer: Self-pay | Admitting: Family Medicine

## 2013-10-11 ENCOUNTER — Ambulatory Visit (INDEPENDENT_AMBULATORY_CARE_PROVIDER_SITE_OTHER): Payer: 59 | Admitting: Family Medicine

## 2013-10-11 VITALS — BP 124/82 | HR 79 | Resp 16 | Ht 66.0 in | Wt 153.0 lb

## 2013-10-11 DIAGNOSIS — J45909 Unspecified asthma, uncomplicated: Secondary | ICD-10-CM

## 2013-10-11 DIAGNOSIS — G894 Chronic pain syndrome: Secondary | ICD-10-CM

## 2013-10-11 DIAGNOSIS — J309 Allergic rhinitis, unspecified: Secondary | ICD-10-CM

## 2013-10-11 DIAGNOSIS — R1011 Right upper quadrant pain: Secondary | ICD-10-CM

## 2013-10-11 MED ORDER — LEVOCETIRIZINE DIHYDROCHLORIDE 5 MG PO TABS: 5.0000 mg | ORAL_TABLET | Freq: Every evening | ORAL | Status: DC

## 2013-10-11 MED ORDER — MONTELUKAST SODIUM 10 MG PO TABS: 10.0000 mg | ORAL_TABLET | ORAL | Status: DC

## 2013-10-11 MED ORDER — FLUTICASONE-SALMETEROL 230-21 MCG/ACT IN AERO: 2.0000 | INHALATION_SPRAY | Freq: Two times a day (BID) | RESPIRATORY_TRACT | Status: DC

## 2013-10-11 NOTE — Progress Notes (Signed)
   Subjective:    Patient ID: Jodi [name], female    DOB: 1976-04-08, 37 y.o.   MRN: [id number]  HPI Pt in today for f/u chronic RUQ pain which has been disabling for over 6 month  Most recent evaluations were at [facility] and she reports that medication she was placed on in December from GI Docs at [facility] has been the most helpful as far as her pain is concerned. She states that Docs there also told her that the pain was from nerve irritation following her cholecystectomy, she had a GI study there in December , and has had 2 clinical evaluations between November and December 2014 , next due in April 2015. She feels able to return to work with no restrictions. Her appt at [facility] has been delayed into the new year, and she has no intention of keeping the appt if she continues to do as well as she currently is doing Still sees pain specialist locally, last script provided in December was for xanax, she has taken only 2 tablets and does not intend to use them regularly or at all   Review of Systems See HPI Denies recent fever or chills. Slight increase in nasal congestion, drainage is clear, denies sore throat or wheeze, no productive cough Denies chest congestion, productive cough or wheezing. Denies chest pains, palpitations and leg swelling Denies  nausea, vomiting,diarrhea or constipation.   Denies dysuria, frequency, hesitancy or incontinence. Denies joint pain, swelling and limitation in mobility. Denies headaches, seizures, numbness, or tingling. Denies depression, anxiety or insomnia. Denies skin break down or rash.        Objective:   Physical Exam  Patient alert and oriented and in no cardiopulmonary distress.  HEENT: No facial asymmetry, EOMI, no sinus tenderness,  oropharynx pink and moist.  Neck supple no adenopathy.  Chest: Clear to auscultation bilaterally.  CVS: S1, S2 no murmurs, no S3.  ABD: Soft non tender. Bowel sounds normal.  Ext: No  edema  MS: Adequate ROM spine, shoulders, hips and knees.  Skin: Intact, no ulcerations or rash noted.  Psych: Good eye contact, normal affect. Memory intact not anxious or depressed appearing.  CNS: CN 2-12 intact, power, tone and sensation normal throughout.       Assessment & Plan:

## 2013-10-11 NOTE — Patient Instructions (Addendum)
F/u in 5 month, call if you need me before  Happy that you are feeling better and well enough to return to work as was expected  On Oct 18, 2013 with no restriction.  Keep f/u with Dr Gerilyn Pilgrim in February  Keep f/u at [facility] GI clinic, in April, and call GI Docs at [facility] if abdominal pain becomes a problem please  Singulair, advair and xyzal will be refilled for 6 month

## 2013-10-14 ENCOUNTER — Telehealth: Payer: Self-pay | Admitting: Family Medicine

## 2013-10-14 MED ORDER — AZITHROMYCIN 250 MG PO TABS: ORAL_TABLET | ORAL | Status: AC

## 2013-10-14 MED ORDER — PREDNISONE 5 MG PO TABS: 5.0000 mg | ORAL_TABLET | Freq: Two times a day (BID) | ORAL | Status: AC

## 2013-10-14 NOTE — Telephone Encounter (Signed)
Advise pt that  I will send in a z pack and  also pred x 5 days , as she is asthmatic

## 2013-10-14 NOTE — Assessment & Plan Note (Signed)
Currently controlled and stable, 6 month refill sent

## 2013-10-14 NOTE — Telephone Encounter (Signed)
Please advise 

## 2013-10-14 NOTE — Telephone Encounter (Signed)
Patient aware.

## 2013-10-14 NOTE — Assessment & Plan Note (Addendum)
Resolved on current treatment plan [facility]  Return to work unrestricted on 10/18/2013 unrestricted

## 2013-10-14 NOTE — Assessment & Plan Note (Signed)
Slight flare of symptoms in recent times, pt to continue daily medication as before

## 2013-10-14 NOTE — Assessment & Plan Note (Signed)
Still to keep f/u with pain clinic as before, in Feb.2015. Current script is for xanax, and pt states she does not use this on any regular basis, states has taken maybe 2 to date and has no intention of taking regulalrly

## 2014-01-05 ENCOUNTER — Ambulatory Visit (INDEPENDENT_AMBULATORY_CARE_PROVIDER_SITE_OTHER): Payer: 59 | Admitting: Family Medicine

## 2014-01-05 ENCOUNTER — Other Ambulatory Visit (INDEPENDENT_AMBULATORY_CARE_PROVIDER_SITE_OTHER): Payer: Self-pay | Admitting: Internal Medicine

## 2014-01-05 ENCOUNTER — Encounter: Payer: Self-pay | Admitting: Family Medicine

## 2014-01-05 VITALS — BP 132/84 | HR 98 | Resp 16 | Ht 66.0 in | Wt 150.0 lb

## 2014-01-05 DIAGNOSIS — J45991 Cough variant asthma: Secondary | ICD-10-CM

## 2014-01-05 DIAGNOSIS — F32A Depression, unspecified: Secondary | ICD-10-CM

## 2014-01-05 DIAGNOSIS — R1011 Right upper quadrant pain: Secondary | ICD-10-CM

## 2014-01-05 DIAGNOSIS — G894 Chronic pain syndrome: Secondary | ICD-10-CM

## 2014-01-05 DIAGNOSIS — J309 Allergic rhinitis, unspecified: Secondary | ICD-10-CM

## 2014-01-05 DIAGNOSIS — F329 Major depressive disorder, single episode, unspecified: Secondary | ICD-10-CM

## 2014-01-05 DIAGNOSIS — K219 Gastro-esophageal reflux disease without esophagitis: Secondary | ICD-10-CM

## 2014-01-05 DIAGNOSIS — F3289 Other specified depressive episodes: Secondary | ICD-10-CM

## 2014-01-05 DIAGNOSIS — J45901 Unspecified asthma with (acute) exacerbation: Secondary | ICD-10-CM | POA: Insufficient documentation

## 2014-01-05 MED ORDER — PREDNISONE (PAK) 5 MG PO TABS
5.0000 mg | ORAL_TABLET | ORAL | Status: DC
Start: 2014-01-05 — End: 2014-01-09

## 2014-01-05 MED ORDER — ALBUTEROL SULFATE HFA 108 (90 BASE) MCG/ACT IN AERS: 2.0000 | INHALATION_SPRAY | RESPIRATORY_TRACT | Status: DC | PRN

## 2014-01-05 MED ORDER — OMEPRAZOLE 20 MG PO CPDR: 20.0000 mg | DELAYED_RELEASE_CAPSULE | Freq: Every day | ORAL | Status: DC

## 2014-01-05 NOTE — Patient Instructions (Signed)
F/yu in 6 month, call if you need me before  You are prescribed prednisone and will get depo medrol in the office  Today  Pls buy and use a peak flow meter daily to monitor your asthma, and avoid irritants by using a mask  Glad that you are better

## 2014-01-05 NOTE — Progress Notes (Signed)
   Subjective:    Patient ID: Jodi [name], female    DOB: 11/30/1975, 38 y.o.   MRN: [id number]  HPI 2 day h/o increased cough, shortness of breath and chest tightness associated with difficulty breathing. States she exposed herself to hay without protective mask , which was the main trigger. Increased decompensation in breathing with dry cough and scratchy throat since then Denies fever, chills, nasal drainage or sputum production. States abdominal pain has resolved ewith medication prescribed by GI at [facility] , and she has upcoming f/u. Also has xanax for as needed use from local pain specialist, though she rarely uses this   Review of Systems See HPI Denies recent fever or chills. Denies sinus pressure, nasal congestion, ear pain or sore throat. Denies chest congestion or  productive cough . Denies chest pains, palpitations and leg swelling Denies abdominal pain, nausea, vomiting,diarrhea or constipation.   Denies dysuria, frequency, hesitancy or incontinence. Denies joint pain, swelling and limitation in mobility. Denies headaches, seizures, numbness, or tingling. Denies depression, anxiety or insomnia. Denies skin break down or rash.        Objective:   Physical Exam BP 132/84  Pulse 98  Resp 16  Ht 5\' 6"  (1.676 m)  Wt 150 lb (68.04 kg)  BMI 24.22 kg/m2  SpO2 100%  LMP 05/17/2012 Patient alert and oriented and in no cardiopulmonary distress.  HEENT: No facial asymmetry, EOMI, no sinus tenderness,  oropharynx pink and moist.  Neck supple no adenopathy.TM clear bilaterally  Chest: decreased air entry, scattered wheezes, high pitched, no crackles  CVS: S1, S2 no murmurs, no S3.  ABD: Soft non tender. Bowel sounds normal.  Ext: No edema  MS: Adequate ROM spine, shoulders, hips and knees.  Skin: Intact, no ulcerations or rash noted.  Psych: Good eye contact, normal affect. Memory intact not anxious or depressed appearing.  CNS: CN 2-12 intact, power, tone  and sensation normal throughout.        Assessment & Plan:  Acute asthma flare Depo medrol in office followed by short course of steroids. Chronic medication as before  ALLERGIC RHINITIS Continue medication as before  Depression Resolved with resolution of abdominal pain and return to work  Abdominal pain, right upper quadrant Resolved , treated by GI at [facility] , has upcoming appt  Chronic pain syndrome Maintained on lonbg acting xanax , as needed, by pain specialist  Cough variant asthma Maintained on singular and daily advair, geenrally well controlled, but recent unprotected exposure to allergen has caused a flare of symptoms. Pt re educated re the need to avoid provoking situations , she is also to start dailoy use of peak flow meter, script for same provided

## 2014-01-06 MED ORDER — METHYLPREDNISOLONE ACETATE 80 MG/ML IJ SUSP
80.0000 mg | Freq: Once | INTRAMUSCULAR | Status: AC
  Administered 2014-01-05: 80 mg via INTRAMUSCULAR

## 2014-01-08 NOTE — Assessment & Plan Note (Signed)
Depo medrol in office followed by short course of steroids. Chronic medication as before

## 2014-01-08 NOTE — Assessment & Plan Note (Signed)
Resolved , [facility] , has upcoming appt

## 2014-01-08 NOTE — Assessment & Plan Note (Signed)
Continue medication as before 

## 2014-01-08 NOTE — Assessment & Plan Note (Signed)
Resolved with resolution of abdominal pain and return to work

## 2014-01-08 NOTE — Assessment & Plan Note (Addendum)
Maintained on singular and daily advair, geenrally well controlled, but recent unprotected exposure to allergen has caused a flare of symptoms. Pt re educated re the need to avoid provoking situations , she is also to start dailoy use of peak flow meter, script for same provided

## 2014-01-08 NOTE — Assessment & Plan Note (Signed)
Maintained on lonbg acting xanax , as needed, by pain specialist

## 2014-01-09 ENCOUNTER — Encounter: Payer: Self-pay | Admitting: Family Medicine

## 2014-01-09 ENCOUNTER — Ambulatory Visit (INDEPENDENT_AMBULATORY_CARE_PROVIDER_SITE_OTHER): Payer: 59 | Admitting: Family Medicine

## 2014-01-09 ENCOUNTER — Telehealth: Payer: Self-pay

## 2014-01-09 ENCOUNTER — Ambulatory Visit (HOSPITAL_COMMUNITY)
Admission: RE | Admit: 2014-01-09 | Discharge: 2014-01-09 | Disposition: A | Discharge status: Home / Self Care | Payer: 59 | Source: Ambulatory Visit | Attending: Family Medicine | Admitting: Family Medicine

## 2014-01-09 VITALS — BP 138/80 | HR 100 | Resp 20 | Ht 66.0 in | Wt 149.0 lb

## 2014-01-09 DIAGNOSIS — R059 Cough, unspecified: Secondary | ICD-10-CM | POA: Insufficient documentation

## 2014-01-09 DIAGNOSIS — R05 Cough: Secondary | ICD-10-CM | POA: Insufficient documentation

## 2014-01-09 DIAGNOSIS — J209 Acute bronchitis, unspecified: Secondary | ICD-10-CM

## 2014-01-09 DIAGNOSIS — J45991 Cough variant asthma: Secondary | ICD-10-CM

## 2014-01-09 DIAGNOSIS — R6883 Chills (without fever): Secondary | ICD-10-CM | POA: Insufficient documentation

## 2014-01-09 DIAGNOSIS — J45901 Unspecified asthma with (acute) exacerbation: Secondary | ICD-10-CM

## 2014-01-09 MED ORDER — METHYLPREDNISOLONE ACETATE 80 MG/ML IJ SUSP
80.0000 mg | Freq: Once | INTRAMUSCULAR | Status: AC
  Administered 2014-01-09: 80 mg via INTRAMUSCULAR

## 2014-01-09 MED ORDER — FLUCONAZOLE 150 MG PO TABS: ORAL_TABLET | ORAL | Status: DC

## 2014-01-09 MED ORDER — LEVOFLOXACIN 500 MG PO TABS: 500.0000 mg | ORAL_TABLET | Freq: Every day | ORAL | Status: DC

## 2014-01-09 MED ORDER — PREDNISONE 10 MG PO TABS: ORAL_TABLET | ORAL | Status: DC

## 2014-01-09 MED ORDER — RANITIDINE HCL 150 MG PO TABS: 150.0000 mg | ORAL_TABLET | Freq: Two times a day (BID) | ORAL | Status: DC

## 2014-01-09 MED ORDER — IPRATROPIUM BROMIDE 0.02 % IN SOLN
0.5000 mg | Freq: Once | RESPIRATORY_TRACT | Status: AC
  Administered 2014-01-09: 0.5 mg via RESPIRATORY_TRACT

## 2014-01-09 MED ORDER — ALBUTEROL SULFATE (2.5 MG/3ML) 0.083% IN NEBU
2.5000 mg | INHALATION_SOLUTION | Freq: Once | RESPIRATORY_TRACT | Status: AC
  Administered 2014-01-09: 2.5 mg via RESPIRATORY_TRACT

## 2014-01-09 NOTE — Patient Instructions (Signed)
F/u in 8 weeks , call if you need me b before. Neb treatment in office and additional dose of depo medrol 80 mg im Prednisone is prescribed again, and you are referred to [name] also Zantac prescribed to protect stomach due to steroids  Work excuse form today return on 01/12/2014, call if unable  CXR today due to chills, and antibiotic course and fluconazole also prescribed

## 2014-01-09 NOTE — Telephone Encounter (Signed)
Please advise if anything further can be done in office.

## 2014-01-09 NOTE — Telephone Encounter (Signed)
pls work in this pm

## 2014-01-09 NOTE — Assessment & Plan Note (Signed)
Prolonged despite appropriate therapy and initial apparent recovery followed acutely by deterioration in symptoms with reduced steroids and new c/o chills

## 2014-01-09 NOTE — Assessment & Plan Note (Signed)
Antibiotic course prescribed and CXr, c/o chills and refractory asthma

## 2014-01-09 NOTE — Assessment & Plan Note (Signed)
unimporoved despite aggressive treatment started 5 days ago. Neb treatment in office rept depomedrol and short course of higher dose prednisone Refer to pulmonary asap for management, ( based on past h/o prolonged asthma flare in Spring in 2012)work excuse to return 01/12/2014

## 2014-01-09 NOTE — Progress Notes (Signed)
   Subjective:    Patient ID: Jodi [name], female    DOB: 05/31/1976, 38 y.o.   MRN: [id number]  HPI Pt reports that her breathing has not improved c/o shortness of breath and excessive cough as soon as she leaves home, first presented 5 days ago, states the depo medrol injection helped, also high doses of oral prednisone, but notes that as she is tapering off the prednisone her symptoms have worsened. Also notes intermittent chills  Should have returned to work today, but unable to do so. States she has started having loss of voice, which is typical for her ( as well as her mother  she states) when the asthma flares. Two years ago had similar symptoms around this time of the year and was actually hospitalized. Had been seeing pulmonary for her allergy as a result, but no flare last year and has not been there for over 1 year Also in her home she is coughing  More and having chest tightness. Also notes hoarseness and los of voice, most marked this past Saturday Has experienced some chills  In the past 2 days, no documented fever, no sputum or nasal drainage    Review of Systems See HPI . Denies sinus pressure, nasal congestion, ear pain or sore throat. Denies chest pains, palpitations and leg swelling Denies abdominal pain, nausea, vomiting,diarrhea or constipation.   Denies dysuria, frequency, hesitancy or incontinence. Denies joint pain, swelling and limitation in mobility. Denies headaches, seizures, numbness, or tingling. Denies depression or  anxiety poor sleep in the past several days due to excessive cough. Denies skin break down or rash.        Objective:   Physical Exam  BP 138/80  Pulse 100  Resp 20  Ht 5\' 6"  (1.676 m)  Wt 149 lb 0.6 oz (67.604 kg)  BMI 24.07 kg/m2  SpO2 100%  LMP 05/17/2012 Patient alert and oriented and in no cardiopulmonary distress.Hoarse and speaking in high pitched tone with mild anxiety and shallow breathing  HEENT: No facial  asymmetry, EOMI, no sinus tenderness,  oropharynx pink and moist.  Neck supple no adenopathy.  Chest: adequate but decreased air entry, high pitched wheezes, no crackles  CVS: S1, S2 no murmurs, no S3.  ABD: Soft non tender. Bowel sounds normal.  Ext: No edema  MS: Adequate ROM spine, shoulders, hips and knees.  Skin: Intact, no ulcerations or rash noted.  Psych: Good eye contact, . Memory intact  mildy anxious not  depressed appearing.  CNS: CN 2-12 intact, power, tone and sensation normal throughout.       Assessment & Plan:  Cough variant asthma unimporoved despite aggressive treatment started 5 days ago. Neb treatment in office rept depomedrol and short course of higher dose prednisone Refer to pulmonary asap for management, ( based on past h/o prolonged asthma flare in Spring in 2012)work excuse to return 01/12/2014  Acute bronchitis Antibiotic course prescribed and CXr, c/o chills and refractory asthma   Acute asthma flare Prolonged despite appropriate therapy and initial apparent recovery followed acutely by deterioration in symptoms with reduced steroids and new c/o chills

## 2014-01-12 ENCOUNTER — Other Ambulatory Visit: Payer: Self-pay | Admitting: Family Medicine

## 2014-02-20 ENCOUNTER — Emergency Department (HOSPITAL_COMMUNITY)
Admission: EM | Admit: 2014-02-20 | Discharge: 2014-02-20 | Disposition: A | Discharge status: Home / Self Care | Payer: 59 | Attending: Emergency Medicine | Admitting: Emergency Medicine

## 2014-02-20 ENCOUNTER — Emergency Department (HOSPITAL_COMMUNITY): Payer: 59

## 2014-02-20 ENCOUNTER — Telehealth: Payer: Self-pay | Admitting: Family Medicine

## 2014-02-20 ENCOUNTER — Encounter (HOSPITAL_COMMUNITY): Payer: Self-pay | Admitting: Emergency Medicine

## 2014-02-20 DIAGNOSIS — J441 Chronic obstructive pulmonary disease with (acute) exacerbation: Secondary | ICD-10-CM | POA: Insufficient documentation

## 2014-02-20 DIAGNOSIS — IMO0002 Reserved for concepts with insufficient information to code with codable children: Secondary | ICD-10-CM | POA: Insufficient documentation

## 2014-02-20 DIAGNOSIS — S0003XA Contusion of scalp, initial encounter: Secondary | ICD-10-CM | POA: Insufficient documentation

## 2014-02-20 DIAGNOSIS — J45901 Unspecified asthma with (acute) exacerbation: Secondary | ICD-10-CM

## 2014-02-20 DIAGNOSIS — Z79899 Other long term (current) drug therapy: Secondary | ICD-10-CM | POA: Insufficient documentation

## 2014-02-20 DIAGNOSIS — Z87891 Personal history of nicotine dependence: Secondary | ICD-10-CM | POA: Insufficient documentation

## 2014-02-20 DIAGNOSIS — S1093XA Contusion of unspecified part of neck, initial encounter: Principal | ICD-10-CM

## 2014-02-20 DIAGNOSIS — E785 Hyperlipidemia, unspecified: Secondary | ICD-10-CM | POA: Insufficient documentation

## 2014-02-20 DIAGNOSIS — K219 Gastro-esophageal reflux disease without esophagitis: Secondary | ICD-10-CM | POA: Insufficient documentation

## 2014-02-20 DIAGNOSIS — Z86718 Personal history of other venous thrombosis and embolism: Secondary | ICD-10-CM | POA: Insufficient documentation

## 2014-02-20 DIAGNOSIS — Y9352 Activity, horseback riding: Secondary | ICD-10-CM | POA: Insufficient documentation

## 2014-02-20 DIAGNOSIS — G8929 Other chronic pain: Secondary | ICD-10-CM | POA: Insufficient documentation

## 2014-02-20 DIAGNOSIS — Z872 Personal history of diseases of the skin and subcutaneous tissue: Secondary | ICD-10-CM | POA: Insufficient documentation

## 2014-02-20 DIAGNOSIS — S0083XA Contusion of other part of head, initial encounter: Secondary | ICD-10-CM | POA: Insufficient documentation

## 2014-02-20 DIAGNOSIS — Z862 Personal history of diseases of the blood and blood-forming organs and certain disorders involving the immune mechanism: Secondary | ICD-10-CM | POA: Insufficient documentation

## 2014-02-20 DIAGNOSIS — Y9289 Other specified places as the place of occurrence of the external cause: Secondary | ICD-10-CM | POA: Insufficient documentation

## 2014-02-20 MED ORDER — MELOXICAM 7.5 MG PO TABS: ORAL_TABLET | ORAL | Status: DC

## 2014-02-20 MED ORDER — HYDROCODONE-ACETAMINOPHEN 5-325 MG PO TABS: 1.0000 | ORAL_TABLET | ORAL | Status: DC | PRN

## 2014-02-20 NOTE — Discharge Instructions (Signed)
Your CT scans are negative for fracture or dislocation. I suspect you have a sprain of your temporomandibular joint. Please use Mobic 2 times daily with food. Please use Norco every 4 hours if needed for pain. This medication may cause drowsiness, please use with caution. Please see your primary physician for possible maxillofacial surgery referral if not improving. Mandibular Contusion  A mandibular contusion is a deep bruise of your jaw. Contusions happen when an injury causes bleeding under the skin. Signs of bruising include pain, puffiness (swelling), and discolored skin. The contusion may turn blue, purple, or yellow. HOME CARE  Put ice on the injured area.  Put ice in a plastic bag.  Place a towel between your skin and the bag.  Leave the ice on for 15-20 minutes, 03-04 times a day.  Eat soft foods for 1 week. Soft foods include:  Baby food.  Gelatin.  Cooked cereal.  Ice cream.  Applesauce.  Bananas.  Eggs.  Pasta.  Cottage cheese.  Soup.  Yogurt.  Cut food into small pieces. Avoid chewing gum or ice.  Only take medicines as told by your doctor.  Avoid opening your mouth widely. GET HELP RIGHT AWAY IF:  Your puffiness or pain is not helped by medicine.  You are not getting better.  You have cracking or clicking (crepitation) sounds coming from the jaw. MAKE SURE YOU:   Understand these instructions.  Will watch your condition.  Will get help right away if you are not doing well or get worse. Document Released: 09/18/2011 Document Revised: 03/30/2012 Document Reviewed: 09/18/2011 [facility] Patient Information 2014 [employer] [place].

## 2014-02-20 NOTE — Telephone Encounter (Signed)
pls order xray of affected jaw and let her know I will sign off

## 2014-02-20 NOTE — ED Provider Notes (Signed)
CSN: [id number]     Arrival date & time 02/20/14  1802 History   First MD Initiated Contact with Patient 02/20/14 1949     Chief Complaint  Patient presents with  . Jaw Pain     (Consider location/radiation/quality/duration/timing/severity/associated sxs/prior Treatment) HPI Comments: Patient presents to the emergency department with the complaint of pain of the lower jaw. The patient states that she was riding her horse when the horse tripped and she fell off. She landed primarily on her jaw. She denies any loss of consciousness. She complains of pain with attempting to chew or open her mouth fully. The lower jaw is sore on the right and the left and in the Center. The patient now has concerns for possible fracture. The patient also states that she sustained a laceration to the inner aspect of her lip, but states that this is beginning to heal. This incident happened 2 days ago.  The history is provided by the patient.    Past Medical History  Diagnosis Date  . Asthma   . DVT (deep venous thrombosis) 10 yrs ago  . Asthma   . Allergic rhinitis   . Hyperlipidemia   . Shortness of breath     SOB even without exertion at times  . GERD (gastroesophageal reflux disease)   . IBS (irritable bowel syndrome)   . 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 Anemia   . Eczema   . Abdominal pain, chronic, right upper quadrant    Past Surgical History  Procedure Laterality Date  . Blood clot removed from lower abdomen  2000  . Cyst removed from ovary and fluid removed from tubes  2000  . Partial hysterectomy  2009  . Esophagogastroduodenoscopy  10/22/2012    Procedure: ESOPHAGOGASTRODUODENOSCOPY (EGD);  Surgeon: [name], MD;  Location: [facility];  Service: Endoscopy;  Laterality: N/A;  100  . Colonoscopy  11/04/2012    Procedure: COLONOSCOPY;  Surgeon: [name], MD;  Location: [facility];  Service: Endoscopy;  Laterality: N/A;  915  . Abdominal hysterectomy  4-5 yrs  ago  . Tubal ligation  1999  . Cholecystectomy  11/12/2012    Procedure: LAPAROSCOPIC CHOLECYSTECTOMY;  Surgeon: Dalia HeadingMark A Jenkins, MD;  Location: [facility];  Service: General;  Laterality: N/A;   Family History  Problem Relation Age of Onset  . Asthma Mother   . Hypertension Mother   . Bronchiolitis Mother   . Hypertension Father   . Hyperlipidemia Father     recurrent rectum polyps   . Diabetes Sister   . Lupus Sister   . Depression Sister    History  Substance Use Topics  . Smoking status: Former Smoker -- 0.25 packs/day for 10 years    Types: Cigarettes    Quit date: 10/13/2008  . Smokeless tobacco: Never Used  . Alcohol Use: No   OB History   Grav Para Term Preterm Abortions TAB SAB Ect Mult Living                 Review of Systems  Constitutional: Negative for activity change.       All ROS Neg except as noted in HPI  HENT: Positive for facial swelling. Negative for nosebleeds.   Eyes: Negative for photophobia and discharge.  Respiratory: Positive for wheezing. Negative for cough and shortness of breath.   Cardiovascular: Negative for chest pain and palpitations.  Gastrointestinal: Negative for abdominal pain and blood in stool.  Genitourinary: Negative for dysuria, frequency and hematuria.  Musculoskeletal: Negative for arthralgias, back pain and neck pain.  Skin: Positive for rash.  Neurological: Negative for dizziness, seizures and speech difficulty.  Psychiatric/Behavioral: Negative for hallucinations and confusion.      Allergies  Compazine  Home Medications   Prior to Admission medications   Medication Sig Start Date End Date Taking? Authorizing Provider  albuterol (PROVENTIL HFA;VENTOLIN HFA) 108 (90 BASE) MCG/ACT inhaler Inhale 2 puffs into the lungs every 4 (four) hours as needed for wheezing or shortness of breath. 01/05/14  Yes Kerri PerchesMargaret E Simpson, MD  desipramine (NORPRAMIN) 50 MG tablet Take 50 mg by mouth at bedtime.   Yes Historical Provider, MD   fluticasone (FLONASE) 50 MCG/ACT nasal spray Place 2 sprays into the nose daily. 07/28/13 07/28/14 Yes Kerri PerchesMargaret E Simpson, MD  fluticasone-salmeterol (ADVAIR HFA) [phone] MCG/ACT inhaler Inhale 2 puffs into the lungs 2 (two) times daily. 10/11/13  Yes Kerri PerchesMargaret E Simpson, MD  ipratropium-albuterol (DUONEB) 0.5-2.5 (3) MG/3ML SOLN Take 3 mLs by nebulization every 6 (six) hours as needed.   Yes Historical Provider, MD  levocetirizine (XYZAL) 5 MG tablet Take 1 tablet (5 mg total) by mouth every evening. 10/11/13  Yes Kerri PerchesMargaret E Simpson, MD  montelukast (SINGULAIR) 10 MG tablet Take 1 tablet (10 mg total) by mouth every morning. 10/11/13  Yes Kerri PerchesMargaret E Simpson, MD  Multiple Vitamins-Minerals (ONE DAILY FOR WOMEN) TABS Take 1 tablet by mouth every morning.   Yes Historical Provider, MD  omeprazole (PRILOSEC) 20 MG capsule Take 1 capsule (20 mg total) by mouth daily. 01/05/14  Yes Len Blalockerri L Setzer, NP  ranitidine (ZANTAC) 150 MG tablet Take 1 tablet (150 mg total) by mouth 2 (two) times daily. 01/09/14  Yes Kerri PerchesMargaret E Simpson, MD   BP 144/73  Pulse 95  Temp(Src) 98.7 F (37.1 C) (Oral)  Resp 20  Ht 5\' 6"  (1.676 m)  Wt 150 lb (68.04 kg)  BMI 24.22 kg/m2  SpO2 100%  LMP 05/17/2012 Physical Exam  Nursing note and vitals reviewed. Constitutional: She is oriented to person, place, and time. She appears well-developed and well-nourished.  Non-toxic appearance.  HENT:  Head: Normocephalic.  Right Ear: Tympanic membrane and external ear normal.  Left Ear: Tympanic membrane and external ear normal.  There is pain to the entire mandible. There is no temporal mandibular joint deformity appreciated. There no chipped teeth. There is no trauma to the tongue.  Eyes: EOM and lids are normal. Pupils are equal, round, and reactive to light.  Neck: Normal range of motion. Neck supple. Carotid bruit is not present.  There is mild soreness of the neck.  Cardiovascular: Normal rate, regular rhythm, normal heart  sounds, intact distal pulses and normal pulses.   Pulmonary/Chest: Breath sounds normal. No respiratory distress.  Abdominal: Soft. Bowel sounds are normal. There is no tenderness. There is no guarding.  Musculoskeletal: Normal range of motion.  No palpable step off of the cervical spine.  Lymphadenopathy:       Head (right side): No submandibular adenopathy present.       Head (left side): No submandibular adenopathy present.    She has no cervical adenopathy.  Neurological: She is alert and oriented to person, place, and time. She has normal strength. No cranial nerve deficit or sensory deficit. She exhibits normal muscle tone. Coordination normal.  Gait is steady.  Skin: Skin is warm and dry.  Psychiatric: She has a normal mood and affect. Her speech is normal.  ED Course  Procedures (including critical care time) Labs Review Labs Reviewed - No data to display  Imaging Review Ct Cervical Spine Wo Contrast  02/20/2014   CLINICAL DATA:  Recent traumatic injury with pain  EXAM: CT MAXILLOFACIAL WITHOUT CONTRAST  CT CERVICAL SPINE WITHOUT CONTRAST  TECHNIQUE: Multidetector CT imaging of the cervical spine and maxillofacial structures were performed using the standard protocol without intravenous contrast. Multiplanar CT image reconstructions of the cervical spine and maxillofacial structures were also generated.  COMPARISON:  None.  FINDINGS: CT MAXILLOFACIAL FINDINGS  The bony structures of maxillofacial region are within normal limits. No acute fracture is noted. The soft tissues reveal no acute abnormality. The orbits and their contents are unremarkable. The nasal passages are clear.  CT CERVICAL SPINE FINDINGS  Seven cervical segments are well visualized. Vertebral body height is well maintained. No acute fracture or acute facet abnormality is noted. The surrounding soft tissues and visualized lung apices are within normal limits.  IMPRESSION: CT of maxillofacial bones:  No acute  abnormality is noted.  CT of the cervical spine:  No acute abnormality noted.   Electronically Signed   By: Alcide Clever M.D.   On: 02/20/2014 21:10   Ct Maxillofacial Wo Cm  02/20/2014   CLINICAL DATA:  Recent traumatic injury with pain  EXAM: CT MAXILLOFACIAL WITHOUT CONTRAST  CT CERVICAL SPINE WITHOUT CONTRAST  TECHNIQUE: Multidetector CT imaging of the cervical spine and maxillofacial structures were performed using the standard protocol without intravenous contrast. Multiplanar CT image reconstructions of the cervical spine and maxillofacial structures were also generated.  COMPARISON:  None.  FINDINGS: CT MAXILLOFACIAL FINDINGS  The bony structures of maxillofacial region are within normal limits. No acute fracture is noted. The soft tissues reveal no acute abnormality. The orbits and their contents are unremarkable. The nasal passages are clear.  CT CERVICAL SPINE FINDINGS  Seven cervical segments are well visualized. Vertebral body height is well maintained. No acute fracture or acute facet abnormality is noted. The surrounding soft tissues and visualized lung apices are within normal limits.  IMPRESSION: CT of maxillofacial bones:  No acute abnormality is noted.  CT of the cervical spine:  No acute abnormality noted.   Electronically Signed   By: Alcide Clever M.D.   On: 02/20/2014 21:10     EKG Interpretation None      MDM The CT scan of the cervical spine reveals no acute abnormality. CT scan of the maxillofacial bones shows no fracture or dislocations. The wound to the inner aspect of the lower lip is healing nicely. There is no crepitus in the area. There is no evidence of cellulitis. These findings have been discussed with the patient terms which he understands. Prescription for Norco and Mobic given to the patient. Patient advised to return to the emergency department if any changes, problems, or concerns.     Final diagnoses:  None    **I have reviewed nursing notes, vital signs,  and all appropriate lab and imaging results for this patient.Kathie Dike, PA-C 02/21/14 [phone]

## 2014-02-20 NOTE — Telephone Encounter (Signed)
Please advise 

## 2014-02-20 NOTE — ED Notes (Addendum)
Horse tripped and pt fell off, pain and LOM of jaw. Lac to lower  Lip  Denies neck pain

## 2014-02-21 NOTE — ED Provider Notes (Signed)
Medical screening examination/treatment/procedure(s) were performed by non-physician practitioner and as supervising physician I was immediately available for consultation/collaboration.   EKG Interpretation None       Donnetta HutchingBrian Amanda Pote, MD 02/21/14 2344

## 2014-02-21 NOTE — Telephone Encounter (Signed)
Patient states that she was seen in the ED and will call back if she has any further problems.

## 2014-03-09 ENCOUNTER — Ambulatory Visit: Payer: 59 | Admitting: Family Medicine

## 2014-03-17 ENCOUNTER — Other Ambulatory Visit: Payer: Self-pay | Admitting: Family Medicine

## 2014-05-22 ENCOUNTER — Other Ambulatory Visit: Payer: Self-pay | Admitting: Family Medicine

## 2014-06-18 ENCOUNTER — Other Ambulatory Visit: Payer: Self-pay | Admitting: Family Medicine

## 2014-06-26 ENCOUNTER — Encounter: Payer: Self-pay | Admitting: Family Medicine

## 2014-06-26 ENCOUNTER — Ambulatory Visit (INDEPENDENT_AMBULATORY_CARE_PROVIDER_SITE_OTHER): Payer: 59 | Admitting: Family Medicine

## 2014-06-26 ENCOUNTER — Telehealth: Payer: Self-pay

## 2014-06-26 VITALS — BP 124/82 | HR 94 | Resp 16 | Ht 66.0 in | Wt 146.4 lb

## 2014-06-26 DIAGNOSIS — Z23 Encounter for immunization: Secondary | ICD-10-CM

## 2014-06-26 DIAGNOSIS — G894 Chronic pain syndrome: Secondary | ICD-10-CM

## 2014-06-26 DIAGNOSIS — J4541 Moderate persistent asthma with (acute) exacerbation: Secondary | ICD-10-CM

## 2014-06-26 DIAGNOSIS — J45901 Unspecified asthma with (acute) exacerbation: Secondary | ICD-10-CM

## 2014-06-26 DIAGNOSIS — J309 Allergic rhinitis, unspecified: Secondary | ICD-10-CM

## 2014-06-26 MED ORDER — PREDNISONE (PAK) 5 MG PO TABS: 5.0000 mg | ORAL_TABLET | ORAL | Status: DC

## 2014-06-26 MED ORDER — ALBUTEROL SULFATE (2.5 MG/3ML) 0.083% IN NEBU
2.5000 mg | INHALATION_SOLUTION | Freq: Once | RESPIRATORY_TRACT | Status: AC
  Administered 2014-06-26: 2.5 mg via RESPIRATORY_TRACT

## 2014-06-26 MED ORDER — METHYLPREDNISOLONE ACETATE 80 MG/ML IJ SUSP
80.0000 mg | Freq: Once | INTRAMUSCULAR | Status: AC
  Administered 2014-06-26: 80 mg via INTRAMUSCULAR

## 2014-06-26 MED ORDER — IPRATROPIUM BROMIDE 0.02 % IN SOLN
0.5000 mg | Freq: Once | RESPIRATORY_TRACT | Status: AC
  Administered 2014-06-26: 0.5 mg via RESPIRATORY_TRACT

## 2014-06-26 NOTE — Progress Notes (Signed)
   Subjective:    Patient ID: Jodi [name], female    DOB: 03-19-1976, 38 y.o.   MRN: [id number]  HPI  3 day h/o worsening chest tightness with wheezing and cough, no fever or chills noted, no sputum production or nasal drainage, has had her asthma stable and controlled on regular medications , first flare since previous one in March. Will check pulmonary Doc's office for qvar to be used during flare per his recommendations. Called in sick yesterday for today and hopes to be ble to return in 2 days Abdominal pain controlled/resolved with medication from [facility], sees Doc there every 3 to 4 months reportedly. Has been using home albuterol nebs every 4 hrs since sick, per pulmonary recommendation, but has not used any today   Review of Systems See HPI Denies recent fever or chills. Denies sinus pressure, nasal congestion, ear pain or sore throat. Denies chest congestion or  productive cough Denies chest pains, palpitations and leg swelling Denies abdominal pain, nausea, vomiting,diarrhea or constipation.   Denies dysuria, frequency, hesitancy or incontinence. Denies joint pain, swelling and limitation in mobility. Denies headaches, seizures, numbness, or tingling. Denies depression, anxiety or insomnia. Denies skin break down or rash.        Objective:   Physical Exam BP 124/82  Pulse 94  Resp 16  Ht  (1.676 m)  Wt 146 lb 6.4 oz (66.407 kg)  BMI 23.64 kg/m2  SpO2 100%  LMP 05/17/2012 Patient alert and oriented and in mild respiratory  distress.  HEENT: No facial asymmetry, EOMI,   oropharynx pink and moist.  Neck supple no JVD, no mass.  Chest:  Though adequate air entry throughout with bilateral wheezing, no crackles  CVS: S1, S2 no murmurs, no S3.Regular rate.  ABD: Soft non tender.   Ext: No edema  MS: Adequate ROM spine, shoulders, hips and knees.  Skin: Intact, no ulcerations or rash noted.  Psych: Good eye contact, normal affect. Memory intact not  anxious or depressed appearing.  CNS: CN 2-12 intact, power,  normal throughout.no focal deficits noted.        Assessment & Plan:  Acute asthma flare 3 day h/o worsening symptoms, using neb treatments at home , albuterol every 4 hours per direction of pulmonary, needs to collect Qvar from pulmonary. Neb treatment in office, depo medrol followed by dose pack . Work excuse to return on 9.16.2015, if unable she will need to seek furgher care  From her pulmonary specialist or the Ed/urgent care , as I will be out,for a week, she understands and agrees  Need for prophylactic vaccination and inoculation against influenza Vaccine administered at visit.   Chronic pain syndrome Well managed by GI at [facility] with nightly desipramine, abdominal pain started in 2014 post cholecystectomy  ALLERGIC RHINITIS Increased symptoms with season change, but controlled

## 2014-06-26 NOTE — Assessment & Plan Note (Signed)
3 day h/o worsening symptoms, using neb treatments at home , albuterol every 4 hours per direction of pulmonary, needs to collect Qvar from pulmonary. Neb treatment in office, depo medrol followed by dose pack . Work excuse to return on 9.16.2015, if unable she will need to seek furgher care  From her pulmonary specialist or the Ed/urgent care , as I will be out,for a week, she understands and agrees

## 2014-06-26 NOTE — Assessment & Plan Note (Signed)
Vaccine administered at visit.  

## 2014-06-26 NOTE — Assessment & Plan Note (Addendum)
Well managed by GI at [facility] with nightly desipramine, abdominal pain started in 2014 post cholecystectomy

## 2014-06-26 NOTE — Patient Instructions (Signed)
F/u in 6  Months, call if you need me before  You are treated today for an asthma flare. Neb treatment and depo medrol injection in office, also prednisone sent in for 6 days  Work excuse from today to return on 06/28/2014  If you are unable to return on 06/28/2014, you will need to seek additional care either with your lung specialist , Dr Juanetta Gosling or  Through the urgent care or ED , as we discussed, , as I will be unavailable until the end of the month  Flu vaccine today

## 2014-06-26 NOTE — Assessment & Plan Note (Signed)
Increased symptoms with season change, but controlled

## 2014-06-26 NOTE — Telephone Encounter (Signed)
Patient will come in at 1030

## 2014-06-26 NOTE — Telephone Encounter (Signed)
opk work in this am

## 2014-06-28 ENCOUNTER — Telehealth: Payer: Self-pay

## 2014-06-28 NOTE — Telephone Encounter (Signed)
Patient aware.  Will come by to collect letter.

## 2014-06-28 NOTE — Telephone Encounter (Signed)
Pls reminfd pt that DEFINITELY if she cannot return to work tomorrow , she will need re evaluation as we discussed and is wriitten on her d/c papaer either by urgent care, ED  or Dr Juanetta Gosling, her pulmonary specialist, as I am unavailable to be responsible foor  and follow this acute illness after today, I explained this at visit , psl review with her so there is no confusion

## 2014-07-11 ENCOUNTER — Encounter: Payer: 59 | Admitting: Family Medicine

## 2014-07-24 ENCOUNTER — Other Ambulatory Visit: Payer: Self-pay | Admitting: Family Medicine

## 2014-07-31 ENCOUNTER — Ambulatory Visit (INDEPENDENT_AMBULATORY_CARE_PROVIDER_SITE_OTHER): Payer: 59 | Admitting: Family Medicine

## 2014-07-31 ENCOUNTER — Telehealth: Payer: Self-pay

## 2014-07-31 ENCOUNTER — Encounter: Payer: Self-pay | Admitting: Family Medicine

## 2014-07-31 VITALS — BP 128/78 | HR 101 | Temp 98.5°F | Resp 18 | Ht 66.0 in | Wt 151.4 lb

## 2014-07-31 DIAGNOSIS — J45991 Cough variant asthma: Secondary | ICD-10-CM

## 2014-07-31 DIAGNOSIS — J320 Chronic maxillary sinusitis: Secondary | ICD-10-CM

## 2014-07-31 DIAGNOSIS — J302 Other seasonal allergic rhinitis: Secondary | ICD-10-CM

## 2014-07-31 MED ORDER — FLUCONAZOLE 150 MG PO TABS: 150.0000 mg | ORAL_TABLET | Freq: Once | ORAL | Status: DC

## 2014-07-31 MED ORDER — PREDNISONE 5 MG PO KIT: PACK | ORAL | Status: DC

## 2014-07-31 MED ORDER — AZITHROMYCIN 250 MG PO TABS: ORAL_TABLET | ORAL | Status: DC

## 2014-07-31 NOTE — Progress Notes (Signed)
   Subjective:    Patient ID: Jodi [name], female    DOB: 10/21/1975, 38 y.o.   MRN: [id number]  HPI  1 week h/o increased post nasal drainage and scratchy throat , worse by last week Friday, she developed fever  and chills last Friday, no sptum production , had fever up to 1 day ago, undocumented Sputum is clear, she alos has sinus pressure and tender right neck gland. Also has had increased chest tightness since sick    Review of Systems See HPI Denies chest pains, palpitations and leg swelling Denies abdominal pain, nausea, vomiting,diarrhea or constipation.   Denies dysuria, frequency, hesitancy or incontinence. Denies joint pain, swelling and limitation in mobility. Denies headaches, seizures, numbness, or tingling. Denies depression, anxiety or insomnia. Denies skin break down or rash.        Objective:   Physical Exam  BP 128/78  Pulse 101  Temp(Src) 98.5 F (36.9 C) (Oral)  Resp 18  Ht 5\' 6"  (1.676 m)  Wt 151 lb 6.4 oz (68.675 kg)  BMI 24.45 kg/m2  SpO2 100%  LMP 05/17/2012 Patient alert and oriented and in no cardiopulmonary distress.  HEENT: No facial asymmetry, EOMI,   oropharynx pink and moist.No exudate or erythema.  Neck supple no JVD, no mass.Right sub mandibular adenitisLeft tM clear, right TM dull with adequate light reflex Right maxillary sinus is tender Chest: Clear to auscultation bilaterally.decreased though adequate air entry, b no crackles or wheezes  CVS: S1, S2 no murmurs, no S3.Regular rate.  ABD: Soft non tender.   Ext: No edema  MS: Adequate ROM spine, shoulders, hips and knees.  Skin: Intact, no ulcerations or rash noted.  Psych: Good eye contact, normal affect. Memory intact not anxious or depressed appearing.  CNS: CN 2-12 intact, power,  normal throughout.no focal deficits noted.       Assessment & Plan:  Right maxillary sinusitis Acute onset, z pack x 1  Allergic rhinitis Uncontrolled, add saline flushes , sudafed  as needed and prednisone dose pack  Cough variant asthma Increased symptoms due to uncontrolled allergies ,but not in status

## 2014-07-31 NOTE — Telephone Encounter (Signed)
Will be seen at 1pm today

## 2014-07-31 NOTE — Assessment & Plan Note (Signed)
Acute onset, z pack x 1

## 2014-07-31 NOTE — Patient Instructions (Signed)
F/u as before  You are treated for acute right maxill;ary sinusitis and uncontrolled allergies  Prednioone 5 mg dose pack is prescribed also fluconazole and z pack  Pls start DAILY saline flushes of nostrils/sinuses to reduce change of sinusitis, continue all allergy and asthma meds as you are currently doing  When you experience increased watery nasal drainage , OK to take sudafed one tablet once or twice daily for 2 to 4 days to reduce this symptom and reduce chance of bacterial super infection.

## 2014-07-31 NOTE — Telephone Encounter (Signed)
Patient will call to be seen at Dr. Juanetta GoslingHawkins.

## 2014-07-31 NOTE — Telephone Encounter (Signed)
Sees [name] for her asthma she recently had asthma flare, I recommend she call him for an appt today, he has work ins today ( as far as I know) and she is already his established pt, pls let her know  Get back to me if probs

## 2014-07-31 NOTE — Assessment & Plan Note (Signed)
Increased symptoms due to uncontrolled allergies ,but not in status

## 2014-07-31 NOTE — Assessment & Plan Note (Signed)
Uncontrolled, add saline flushes , sudafed as needed and prednisone dose pack

## 2014-07-31 NOTE — Telephone Encounter (Signed)
Please advise 

## 2014-08-26 ENCOUNTER — Other Ambulatory Visit: Payer: Self-pay | Admitting: Family Medicine

## 2014-08-28 ENCOUNTER — Encounter: Payer: Self-pay | Admitting: Family Medicine

## 2014-08-28 ENCOUNTER — Ambulatory Visit (INDEPENDENT_AMBULATORY_CARE_PROVIDER_SITE_OTHER): Payer: 59 | Admitting: Family Medicine

## 2014-08-28 VITALS — BP 118/66 | HR 96 | Resp 18 | Ht 66.0 in | Wt 154.0 lb

## 2014-08-28 DIAGNOSIS — J45991 Cough variant asthma: Secondary | ICD-10-CM

## 2014-08-28 DIAGNOSIS — M79641 Pain in right hand: Secondary | ICD-10-CM

## 2014-08-28 MED ORDER — IBUPROFEN 800 MG PO TABS: 800.0000 mg | ORAL_TABLET | Freq: Three times a day (TID) | ORAL | Status: DC

## 2014-08-28 MED ORDER — PREDNISONE 5 MG PO TABS: 5.0000 mg | ORAL_TABLET | Freq: Two times a day (BID) | ORAL | Status: AC

## 2014-08-28 NOTE — Progress Notes (Signed)
   Subjective:    Patient ID: Jodi [name], female    DOB: 01/29/1976, 38 y.o.   MRN: [id number]  HPI 3 day h/o acute right hand pain limiting power, no trauma to explain symptom, states she had tendinitis in the past Denies chest tightness or wheeze, no fever , choills or sinus pressure, has otherwise been doing well   Review of Systems See HPI = Denies chest pains, palpitations and leg swelling Denies abdominal pain, nausea, vomiting,diarrhea or constipation.   Denies dysuria, frequency, hesitancy or incontinence.  Denies headaches, seizures, numbness, or tingling. Denies depression, anxiety or insomnia. Denies skin break down or rash.        Objective:   Physical Exam BP 118/66 mmHg  Pulse 96  Resp 18  Ht 5\' 6"  (1.676 m)  Wt 154 lb (69.854 kg)  BMI 24.87 kg/m2  SpO2 99%  LMP 05/17/2012  Patient alert and oriented and in no cardiopulmonary distress.  HEENT: No facial asymmetry, EOMI,   oropharynx pink and moist.  Neck supple no JVD, no mass.  Chest: Clear to auscultation bilaterally.  .   Ext: No edema  MS: Adequate ROM spine, shoulders, hips and knees. Decreased grip in right hand with tenderness along ulnar aspect  .        Assessment & Plan:  Hand pain, right Acute right hand pain with reduced power in past 3 days. Anti inflammatories and refer to dr Hilda LiasKeeling  Cough variant asthma controlled

## 2014-08-28 NOTE — Assessment & Plan Note (Signed)
controlled 

## 2014-08-28 NOTE — Assessment & Plan Note (Signed)
Acute right hand pain with reduced power in past 3 days. Anti inflammatories and refer to dr Hilda LiasKeeling

## 2014-08-28 NOTE — Patient Instructions (Signed)
F/u as before  You are referred to Dr Hilda LiasKeeling for further evaluation and management of acute right hand pain. Ibuprofen and prednisone are prescribed and you are given a script for a hand brace also to use in the interim  Call early afternoon tomorrow about your appt with Dr Hilda LiasKeeling if you do not get a call from the office  By that time please

## 2014-09-04 ENCOUNTER — Telehealth: Payer: Self-pay

## 2014-09-04 ENCOUNTER — Ambulatory Visit (INDEPENDENT_AMBULATORY_CARE_PROVIDER_SITE_OTHER): Payer: 59 | Admitting: Internal Medicine

## 2014-09-04 VITALS — BP 116/62 | HR 109 | Temp 98.1°F | Resp 16 | Ht 65.0 in | Wt 154.0 lb

## 2014-09-04 DIAGNOSIS — R319 Hematuria, unspecified: Secondary | ICD-10-CM

## 2014-09-04 DIAGNOSIS — N390 Urinary tract infection, site not specified: Secondary | ICD-10-CM

## 2014-09-04 DIAGNOSIS — R350 Frequency of micturition: Secondary | ICD-10-CM

## 2014-09-04 LAB — POCT URINALYSIS DIPSTICK
Bilirubin, UA: NEGATIVE
Blood, UA: NEGATIVE
Glucose, UA: NEGATIVE
Nitrite, UA: NEGATIVE
PROTEIN UA: NEGATIVE
Spec Grav, UA: 1.02
UROBILINOGEN UA: 1
pH, UA: 5.5

## 2014-09-04 LAB — POCT UA - MICROSCOPIC ONLY
Casts, Ur, LPF, POC: NEGATIVE
Crystals, Ur, HPF, POC: NEGATIVE
Mucus, UA: NEGATIVE
YEAST UA: NEGATIVE

## 2014-09-04 MED ORDER — CIPROFLOXACIN HCL 500 MG PO TABS: 500.0000 mg | ORAL_TABLET | Freq: Two times a day (BID) | ORAL | Status: AC

## 2014-09-04 NOTE — Progress Notes (Signed)
MRN: [id number] DOB: 10/12/1976  Subjective:   Jodi [name] is a 38 y.o. female presenting for 1 day history of urinary frequency and cloudy urine. Some mild right side flank pain started today and has been constant. Has been drinking cranberry juice with some relief. Has had multiple UTI's in the past, previously used Septra for UTI's but patient feels like this may not be working anymore. Denies fevers, dysuria, hematuria, abdominal/pelvic pain, n/v, diarrhea. Reports good hygiene and generally does not hold urine. Denies any other aggravating or relieving factors, no other questions or concerns.   Prior to Admission medications   Medication Sig Start Date End Date Taking? Authorizing Provider  ADVAIR HFA 230-21 MCG/ACT inhaler INHALE TWO PUFFS BY MOUTH TWICE DAILY 07/25/14  Yes Kerri PerchesMargaret E Simpson, MD  albuterol (PROVENTIL HFA;VENTOLIN HFA) 108 (90 BASE) MCG/ACT inhaler Inhale 2 puffs into the lungs every 4 (four) hours as needed for wheezing or shortness of breath. 01/05/14  Yes Kerri PerchesMargaret E Simpson, MD  desipramine (NORPRAMIN) 50 MG tablet Take 50 mg by mouth at bedtime.   Yes Historical Provider, MD  fluticasone (FLONASE) 50 MCG/ACT nasal spray USE TWO SPRAY(S) IN EACH NOSTRIL ONCE DAILY 06/20/14  Yes Kerri PerchesMargaret E Simpson, MD  ibuprofen (ADVIL,MOTRIN) 800 MG tablet Take 1 tablet (800 mg total) by mouth 3 (three) times daily. 08/28/14  Yes Kerri PerchesMargaret E Simpson, MD  ipratropium-albuterol (DUONEB) 0.5-2.5 (3) MG/3ML SOLN Take 3 mLs by nebulization every 6 (six) hours as needed.   Yes Historical Provider, MD  levocetirizine (XYZAL) 5 MG tablet TAKE ONE TABLET BY MOUTH ONCE DAILY IN THE EVENING 07/25/14  Yes Kerri PerchesMargaret E Simpson, MD  montelukast (SINGULAIR) 10 MG tablet Take 1 tablet (10 mg total) by mouth every morning. 10/11/13  Yes Kerri PerchesMargaret E Simpson, MD  Multiple Vitamins-Minerals (ONE DAILY FOR WOMEN) TABS Take 1 tablet by mouth every morning.   Yes Historical Provider, MD  omeprazole (PRILOSEC)  20 MG capsule Take 1 capsule (20 mg total) by mouth daily. 01/05/14  Yes Len Blalockerri L Setzer, NP  ranitidine (ZANTAC) 150 MG tablet TAKE ONE TABLET BY MOUTH TWICE DAILY 08/28/14  Yes Kerri PerchesMargaret E Simpson, MD    Allergies  Allergen Reactions  . Compazine Other (See Comments)    Face swells, tongue sticks out    Past Medical History  Diagnosis Date  . Asthma   . DVT (deep venous thrombosis) 10 yrs ago  . Asthma   . Allergic rhinitis   . Hyperlipidemia   . Shortness of breath     SOB even without exertion at times  . GERD (gastroesophageal reflux disease)   . IBS (irritable bowel syndrome)   . 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 Anemia   . Eczema   . Abdominal pain, chronic, right upper quadrant     Past Surgical History  Procedure Laterality Date  . Blood clot removed from lower abdomen  2000  . Cyst removed from ovary and fluid removed from tubes  2000  . Partial hysterectomy  2009  . Esophagogastroduodenoscopy  10/22/2012    Procedure: ESOPHAGOGASTRODUODENOSCOPY (EGD);  Surgeon: [name], MD;  Location: [facility];  Service: Endoscopy;  Laterality: N/A;  100  . Colonoscopy  11/04/2012    Procedure: COLONOSCOPY;  Surgeon: [name], MD;  Location: [facility];  Service: Endoscopy;  Laterality: N/A;  915  . Abdominal hysterectomy  4-5 yrs ago  . Tubal ligation  1999  .  Cholecystectomy  11/12/2012    Procedure: LAPAROSCOPIC CHOLECYSTECTOMY;  Surgeon: Dalia HeadingMark A Jenkins, MD;  Location: [facility];  Service: General;  Laterality: N/A;    ROS As in subjective.   Objective:   Vitals: BP 116/62 mmHg  Pulse 109  Temp(Src) 98.1 F (36.7 C)  Resp 16  Ht 5\' 5"  (1.651 m)  Wt 154 lb (69.854 kg)  BMI 25.63 kg/m2  SpO2 100%  LMP 05/17/2012  Physical Exam  Constitutional: She is oriented to person, place, and time and well-developed, well-nourished, and in no distress. No distress.  Cardiovascular: Regular rhythm, normal heart sounds and intact distal pulses.   Tachycardia present.  Exam reveals no gallop and no friction rub.   No murmur heard. Pulmonary/Chest: Effort normal and breath sounds normal. No respiratory distress. She has no wheezes. She has no rales. She exhibits no tenderness.  Abdominal: Soft. Bowel sounds are normal. She exhibits no distension and no mass. There is no tenderness (no CVA tenderness, no pelvic tenderness). There is no guarding.  Neurological: She is alert and oriented to person, place, and time.  Skin: Skin is warm and dry. She is not diaphoretic.    Results for orders placed or performed in visit on 09/04/14 (from the past 24 hour(s))  POCT urinalysis dipstick     Status: None   Collection Time: 09/04/14  8:20 PM  Result Value Ref Range   Color, UA yellow    Clarity, UA cloudy    Glucose, UA neg    Bilirubin, UA neg    Ketones, UA trace    Spec Grav, UA 1.020    Blood, UA neg    pH, UA 5.5    Protein, UA neg    Urobilinogen, UA 1.0    Nitrite, UA neg    Leukocytes, UA moderate (2+)   POCT UA - Microscopic Only     Status: None   Collection Time: 09/04/14  8:20 PM  Result Value Ref Range   WBC, Ur, HPF, POC TNTC    RBC, urine, microscopic 1-4    Bacteria, U Microscopic 1+    Mucus, UA neg    Epithelial cells, urine per micros 3-6    Crystals, Ur, HPF, POC neg    Casts, Ur, LPF, POC neg    Yeast, UA neg      Assessment and Plan :   1. Urine frequency 2. Urinary tract infection with hematuria, site unspecified - POCT urinalysis dipstick - POCT UA - Microscopic Only - Urine culture pending - ciprofloxacin (CIPRO) 500 MG tablet; Take 1 tablet (500 mg total) by mouth 2 (two) times daily.  Dispense: 10 tablet; Refill: 0 - if UTI's persist in the future, consider referral to Urology for further evaluation    Wallis BambergMario Noble Bodie, PA-C [facility] [phone] 09/04/2014 8:51 PM I have participated in the care of this patient with the Advanced Practice Provider  and agree with Diagnosis and Plan as documented. Robert P. Merla Richesoolittle, M.D.

## 2014-09-04 NOTE — Telephone Encounter (Signed)
Patient states that she will try to get med from her specialist

## 2014-09-04 NOTE — Telephone Encounter (Signed)
Called and left message for patient to return call.  She will need to come in for nurse visit.

## 2014-09-04 NOTE — Patient Instructions (Addendum)
Continue hydration, cranberry juice, take Azo over-the-counter if you have burning sensation with urination. If UTI's continue to happen with you, we will consider a referral to Urology for further evaluation.    Urinary Tract Infection Urinary tract infections (UTIs) can develop anywhere along your urinary tract. Your urinary tract is your body's drainage system for removing wastes and extra water. Your urinary tract includes two kidneys, two ureters, a bladder, and a urethra. Your kidneys are a pair of bean-shaped organs. Each kidney is about the size of your fist. They are located below your ribs, one on each side of your spine. CAUSES Infections are caused by microbes, which are microscopic organisms, including fungi, viruses, and bacteria. These organisms are so small that they can only be seen through a microscope. Bacteria are the microbes that most commonly cause UTIs. SYMPTOMS  Symptoms of UTIs may vary by age and gender of the patient and by the location of the infection. Symptoms in young women typically include a frequent and intense urge to urinate and a painful, burning feeling in the bladder or urethra during urination. Older women and men are more likely to be tired, shaky, and weak and have muscle aches and abdominal pain. A fever may mean the infection is in your kidneys. Other symptoms of a kidney infection include pain in your back or sides below the ribs, nausea, and vomiting. DIAGNOSIS To diagnose a UTI, your caregiver will ask you about your symptoms. Your caregiver also will ask to provide a urine sample. The urine sample will be tested for bacteria and white blood cells. White blood cells are made by your body to help fight infection. TREATMENT  Typically, UTIs can be treated with medication. Because most UTIs are caused by a bacterial infection, they usually can be treated with the use of antibiotics. The choice of antibiotic and length of treatment depend on your symptoms and  the type of bacteria causing your infection. HOME CARE INSTRUCTIONS  If you were prescribed antibiotics, take them exactly as your caregiver instructs you. Finish the medication even if you feel better after you have only taken some of the medication.  Drink enough water and fluids to keep your urine clear or pale yellow.  Avoid caffeine, tea, and carbonated beverages. They tend to irritate your bladder.  Empty your bladder often. Avoid holding urine for long periods of time.  Empty your bladder before and after sexual intercourse.  After a bowel movement, women should cleanse from front to back. Use each tissue only once. SEEK MEDICAL CARE IF:   You have back pain.  You develop a fever.  Your symptoms do not begin to resolve within 3 days. SEEK IMMEDIATE MEDICAL CARE IF:   You have severe back pain or lower abdominal pain.  You develop chills.  You have nausea or vomiting.  You have continued burning or discomfort with urination. MAKE SURE YOU:   Understand these instructions.  Will watch your condition.  Will get help right away if you are not doing well or get worse. Document Released: 07/09/2005 Document Revised: 03/30/2012 Document Reviewed: 11/07/2011 [facility] Patient Information 2015 [employer] [place]. This information is not intended to replace advice given to you by your health care provider. Make sure you discuss any questions you have with your health care provider.

## 2014-09-04 NOTE — Telephone Encounter (Signed)
Let her submit Ua at the lab and reflex c/s, no meds until I at lweast see the Ua

## 2014-09-04 NOTE — Telephone Encounter (Signed)
Patient states that she is unable to come in to the office due to her work schedule.  She is having odor to urine and cloudy urine. Please advise.

## 2014-09-08 LAB — URINE CULTURE: Colony Count: >=100000

## 2014-09-28 ENCOUNTER — Other Ambulatory Visit: Payer: Self-pay

## 2014-09-28 ENCOUNTER — Telehealth: Payer: Self-pay | Admitting: *Deleted

## 2014-09-28 MED ORDER — PREDNISONE (PAK) 5 MG PO TABS: ORAL_TABLET | ORAL | Status: DC

## 2014-09-28 NOTE — Telephone Encounter (Signed)
Pt called requesting to be seen because she has a cold pt said it is messing with her asthma and I scheduled pt for 10/02/14 pt would like to be seen sooner or would liek to know something she can do until appt. Please advise

## 2014-09-28 NOTE — Telephone Encounter (Signed)
Pt aware.

## 2014-09-28 NOTE — Telephone Encounter (Signed)
pls send in pred 5 mg dose pack x #21 only let her know

## 2014-09-28 NOTE — Telephone Encounter (Signed)
Clear sinus drainage (using sudafed) and dry cough (using robitussin) but wants to know if she can have some prednisone because its causing her asthma to flare- chest feels tight and having some SOB.  HAS APPT SCHEDULED FOR Monday. [facility] but [facility] had no appts

## 2014-09-29 ENCOUNTER — Other Ambulatory Visit: Payer: Self-pay | Admitting: Family Medicine

## 2014-10-02 ENCOUNTER — Ambulatory Visit (INDEPENDENT_AMBULATORY_CARE_PROVIDER_SITE_OTHER): Payer: 59 | Admitting: Family Medicine

## 2014-10-02 ENCOUNTER — Encounter: Payer: Self-pay | Admitting: Family Medicine

## 2014-10-02 VITALS — BP 130/82 | HR 96 | Temp 98.7°F | Resp 17 | Ht 66.0 in | Wt 154.4 lb

## 2014-10-02 DIAGNOSIS — J4541 Moderate persistent asthma with (acute) exacerbation: Secondary | ICD-10-CM

## 2014-10-02 DIAGNOSIS — J3089 Other allergic rhinitis: Secondary | ICD-10-CM

## 2014-10-02 DIAGNOSIS — J45991 Cough variant asthma: Secondary | ICD-10-CM

## 2014-10-02 DIAGNOSIS — J0100 Acute maxillary sinusitis, unspecified: Secondary | ICD-10-CM | POA: Insufficient documentation

## 2014-10-02 MED ORDER — PREDNISONE 20 MG PO TABS: ORAL_TABLET | ORAL | Status: DC

## 2014-10-02 MED ORDER — METHYLPREDNISOLONE ACETATE 80 MG/ML IJ SUSP
80.0000 mg | Freq: Once | INTRAMUSCULAR | Status: AC
  Administered 2014-10-02: 80 mg via INTRAMUSCULAR

## 2014-10-02 MED ORDER — AZITHROMYCIN 250 MG PO TABS: ORAL_TABLET | ORAL | Status: DC

## 2014-10-02 MED ORDER — HYDROCOD POLST-CHLORPHEN POLST 10-8 MG/5ML PO LQCR: ORAL | Status: DC

## 2014-10-02 NOTE — Assessment & Plan Note (Signed)
Antibiotic and work excuse

## 2014-10-02 NOTE — Assessment & Plan Note (Addendum)
Still uncontrolled, depo medrol in office, prednisone at higher doses and withdraw over next 1 week ,and allergist to help with management, pt seldom able to see her pulmonologoist Work excuse for 3 days

## 2014-10-02 NOTE — Progress Notes (Signed)
   Subjective:    Patient ID: Jodi [name], female    DOB: 04/11/1976, 38 y.o.   MRN: [id number]  HPI The PT is here with a 1 week h/o increased cough and wheeze. Condition worsened in past 4 days, has had a lot exposure to people ill with respiratory disease, notes intermittent chills and fever these past 4 days, temp  99.5 reportedly, increased left facial pressure with yellow nasal drainage. Increased  Wheeze and dry cough especially disturbing her sleep at night      Review of Systems See HPI  Denies chest pains, palpitations and leg swelling Denies abdominal pain, nausea, vomiting,diarrhea or constipation.   Denies dysuria, frequency, hesitancy or incontinence. Denies joint pain, swelling and limitation in mobility. Denies headaches, seizures, numbness, or tingling. Denies depression, anxiety excess cough is dusturbing sleep. Denies skin break down or rash.         Objective:   Physical Exam BP 130/82 mmHg  Pulse 96  Temp(Src) 98.7 F (37.1 C) (Oral)  Resp 17  Ht 5\' 6"  (1.676 m)  Wt 154 lb 6.4 oz (70.035 kg)  BMI 24.93 kg/m2  SpO2 100%  LMP 05/17/2012 Patient alert and oriented and in mild cardiopulmonary distress.  HEENT: No facial asymmetry, EOMI,   oropharynx pink and moist.  Neck supple no JVD, no mass. Left maxillary sinus tender, TM clear Chest: Decreased air entry, bilateral high pitched wheezes no crackles  CVS: S1, S2 no murmurs, no S3.Regular rate.  ABD: Soft non tender.   Ext: No edema  MS: Adequate ROM spine, shoulders, hips and knees.  Skin: Intact, no ulcerations or rash noted.  Psych: Good eye contact, normal affect. Memory intact not anxious or depressed appearing.  CNS: CN 2-12 intact, power,  normal throughout.no focal deficits noted.        Assessment & Plan:  Maxillary sinusitis, acute Antibiotic and work excuse  Allergic rhinitis Uncontrolled, ensure compliance with meds prescribe , and eval by allergist  Acute  asthma flare Still uncontrolled, depo medrol in office, prednisone at higher doses and withdraw over next 1 week ,and allergist to help with management, pt seldom able to see her pulmonologoist Work excuse for 3 days

## 2014-10-02 NOTE — Patient Instructions (Signed)
F/u as before  You are treatted for acute sinusitis and uncontrolled allergies   Depo medrol 80 mg Im in office, and 3 medications are also prescribed  Work excuse from today return 10/05/2014  You are referred to allergist  Start the 20 mg prednisone tablet, as prescribed, today also please

## 2014-10-02 NOTE — Assessment & Plan Note (Signed)
Uncontrolled, ensure compliance with meds prescribe , and eval by allergist

## 2014-10-09 ENCOUNTER — Encounter: Payer: 59 | Admitting: Family Medicine

## 2014-10-26 ENCOUNTER — Telehealth: Payer: Self-pay | Admitting: Family Medicine

## 2014-10-31 ENCOUNTER — Encounter: Payer: Self-pay | Admitting: Family Medicine

## 2014-10-31 ENCOUNTER — Ambulatory Visit (INDEPENDENT_AMBULATORY_CARE_PROVIDER_SITE_OTHER): Payer: 59 | Admitting: Family Medicine

## 2014-10-31 ENCOUNTER — Ambulatory Visit (HOSPITAL_COMMUNITY)
Admission: RE | Admit: 2014-10-31 | Discharge: 2014-10-31 | Disposition: A | Discharge status: Home / Self Care | Payer: 59 | Source: Ambulatory Visit | Attending: Family Medicine | Admitting: Family Medicine

## 2014-10-31 VITALS — BP 110/76 | HR 99 | Resp 18 | Ht 66.0 in | Wt 159.1 lb

## 2014-10-31 DIAGNOSIS — R05 Cough: Secondary | ICD-10-CM | POA: Diagnosis not present

## 2014-10-31 DIAGNOSIS — G894 Chronic pain syndrome: Secondary | ICD-10-CM

## 2014-10-31 DIAGNOSIS — J0101 Acute recurrent maxillary sinusitis: Secondary | ICD-10-CM

## 2014-10-31 DIAGNOSIS — E559 Vitamin D deficiency, unspecified: Secondary | ICD-10-CM

## 2014-10-31 DIAGNOSIS — R6883 Chills (without fever): Secondary | ICD-10-CM | POA: Diagnosis not present

## 2014-10-31 DIAGNOSIS — J42 Unspecified chronic bronchitis: Secondary | ICD-10-CM

## 2014-10-31 DIAGNOSIS — J45991 Cough variant asthma: Secondary | ICD-10-CM

## 2014-10-31 DIAGNOSIS — J3089 Other allergic rhinitis: Secondary | ICD-10-CM

## 2014-10-31 DIAGNOSIS — J4541 Moderate persistent asthma with (acute) exacerbation: Secondary | ICD-10-CM

## 2014-10-31 DIAGNOSIS — E785 Hyperlipidemia, unspecified: Secondary | ICD-10-CM

## 2014-10-31 LAB — CBC WITH DIFFERENTIAL/PLATELET
Basophils Absolute: 0 10*3/uL (ref 0.0–0.1)
Basophils Relative: 0 % (ref 0–1)
Eosinophils Absolute: 0.1 10*3/uL (ref 0.0–0.7)
Eosinophils Relative: 1 % (ref 0–5)
HCT: 37.4 % (ref 36.0–46.0)
Hemoglobin: 12.8 g/dL (ref 12.0–15.0)
Lymphocytes Relative: 27 % (ref 12–46)
Lymphs Abs: 1.7 10*3/uL (ref 0.7–4.0)
MCH: 31.4 pg (ref 26.0–34.0)
MCHC: 34.2 g/dL (ref 30.0–36.0)
MCV: 91.7 fL (ref 78.0–100.0)
MONO ABS: 0.6 10*3/uL (ref 0.1–1.0)
MONOS PCT: 9 % (ref 3–12)
MPV: 10.6 fL (ref 8.6–12.4)
NEUTROS ABS: 3.9 10*3/uL (ref 1.7–7.7)
Neutrophils Relative %: 63 % (ref 43–77)
PLATELETS: 348 10*3/uL (ref 150–400)
RBC: 4.08 MIL/uL (ref 3.87–5.11)
RDW: 13.9 % (ref 11.5–15.5)
WBC: 6.2 10*3/uL (ref 4.0–10.5)

## 2014-10-31 LAB — COMPREHENSIVE METABOLIC PANEL
ALK PHOS: 76 U/L (ref 39–117)
ALT: 16 U/L (ref 0–35)
AST: 20 U/L (ref 0–37)
Albumin: 3.9 g/dL (ref 3.5–5.2)
BUN: 14 mg/dL (ref 6–23)
CO2: 29 mEq/L (ref 19–32)
Calcium: 9.3 mg/dL (ref 8.4–10.5)
Chloride: 101 mEq/L (ref 96–112)
Creat: 0.8 mg/dL (ref 0.50–1.10)
Glucose, Bld: 62 mg/dL — ABNORMAL LOW (ref 70–99)
Potassium: 3.8 mEq/L (ref 3.5–5.3)
SODIUM: 137 meq/L (ref 135–145)
TOTAL PROTEIN: 6.5 g/dL (ref 6.0–8.3)
Total Bilirubin: 0.5 mg/dL (ref 0.2–1.2)

## 2014-10-31 LAB — LIPID PANEL
Cholesterol: 209 mg/dL — ABNORMAL HIGH (ref 0–200)
HDL: 73 mg/dL (ref >39)
LDL Cholesterol: 124 mg/dL — ABNORMAL HIGH (ref 0–99)
Total CHOL/HDL Ratio: 2.9 Ratio
Triglycerides: 61 mg/dL (ref <150)
VLDL: 12 mg/dL (ref 0–40)

## 2014-10-31 MED ORDER — IPRATROPIUM BROMIDE 0.02 % IN SOLN
0.5000 mg | Freq: Once | RESPIRATORY_TRACT | Status: AC
  Administered 2014-10-31: 0.5 mg via RESPIRATORY_TRACT

## 2014-10-31 MED ORDER — ALBUTEROL SULFATE (2.5 MG/3ML) 0.083% IN NEBU
2.5000 mg | INHALATION_SOLUTION | Freq: Once | RESPIRATORY_TRACT | Status: AC
  Administered 2014-10-31: 2.5 mg via RESPIRATORY_TRACT

## 2014-10-31 MED ORDER — PREDNISONE (PAK) 5 MG PO TABS: 5.0000 mg | ORAL_TABLET | ORAL | Status: DC

## 2014-10-31 MED ORDER — PROMETHAZINE-DM 6.25-15 MG/5ML PO SYRP: 5.0000 mL | ORAL_SOLUTION | Freq: Every evening | ORAL | Status: DC | PRN

## 2014-10-31 MED ORDER — METHYLPREDNISOLONE ACETATE 80 MG/ML IJ SUSP
80.0000 mg | Freq: Once | INTRAMUSCULAR | Status: AC
  Administered 2014-10-31: 80 mg via INTRAMUSCULAR

## 2014-10-31 NOTE — Progress Notes (Signed)
   Subjective:    Patient ID: Jodi [name], female    DOB: 06/06/1976, 39 y.o.   MRN: [id number]  HPI 3 week h/o of ongoing cough, dyspnea on exertion , ongoing post nasal drainage,, intermittent chills, not much sputum production, cough is worse at nigth and disturbing sleep, she has seen allergist and has a f/u appt C/o facial pressure  Not improved despite treatment over cheeks  Review of Systems See HPI  Denies chest pains, palpitations and leg swelling Denies abdominal pain, nausea, vomiting,diarrhea or constipation.   Denies dysuria, frequency, hesitancy or incontinence. Denies joint pain, swelling and limitation in mobility. Denies headaches, seizures, numbness, or tingling. Denies depression, anxiety or insomnia. Denies skin break down or rash.        Objective:   Physical Exam BP 110/76 mmHg  Pulse 99  Resp 18  Ht 5\' 6"  (1.676 m)  Wt 159 lb 1.9 oz (72.176 kg)  BMI 25.69 kg/m2  SpO2 100%  LMP 05/17/2012  Patient alert and oriented and in no cardiopulmonary distress.  HEENT: No facial asymmetry, EOMI,   oropharynx pink and moist.  Neck supple no JVD, no mass.TM clear Bilaterally, maxillary  sinuses  tender, nasal mucosa erythematous and edematous  Chest: bilateral wheezes  And reduced air entry, no crackles.  CVS: S1, S2 no murmurs, no S3.Regular rate.  ABD: Soft non tender.   Ext: No edema  MS: Adequate ROM spine, shoulders, hips and knees.  Skin: Intact, no ulcerations or rash noted.  Psych: Good eye contact, normal affect. Memory intact not anxious or depressed appearing.  CNS: CN 2-12 intact, power,  normal throughout.no focal deficits noted.        Assessment & Plan:  Acute asthma flare Uncontrolled , neb treatment and depo medrol in office , followed by prednisone dose pack. Pt also referred to [name] who is her pulmonologist for improved management    Allergic rhinitis Uncontrolled with excess cough, prednisone dose pack and  phenergan DM   Chronic pain syndrome Controlled, no change in medication [facility]   Chronic bronchitis CXR today to ensure no infection, I do believe this is primarily because of uncontrolled allergies   Vitamin D deficiency Weekly vit d to be continued   Cough variant asthma Uncontrolled pt is referred to Dr. Juanetta GoslingHawkins   Acute recurrent maxillary sinusitis Symptomatic howvere, I do believe this is allergy mediated , will obtain sinus films

## 2014-10-31 NOTE — Patient Instructions (Addendum)
F/u as before  You are referred to [name]  Keep f/u with Dr. Willa RoughHicks.  Neb treatment in office and depo medrol , Xrays have been ordered , need this today also blood work  Prednisone and phenergan DM are prescribed  Hope you feel better soon

## 2014-10-31 NOTE — Telephone Encounter (Signed)
Patient will be seen 1/19

## 2014-11-01 LAB — TSH: TSH: 1.335 u[IU]/mL (ref 0.350–4.500)

## 2014-11-01 LAB — VITAMIN D 25 HYDROXY (VIT D DEFICIENCY, FRACTURES): Vit D, 25-Hydroxy: 17 ng/mL — ABNORMAL LOW (ref 30–100)

## 2014-11-01 MED ORDER — VITAMIN D (ERGOCALCIFEROL) 1.25 MG (50000 UNIT) PO CAPS
50000.0000 [IU] | ORAL_CAPSULE | ORAL | Status: DC
Start: 2014-11-01 — End: 2016-02-20

## 2014-11-04 DIAGNOSIS — J0101 Acute recurrent maxillary sinusitis: Secondary | ICD-10-CM

## 2014-11-04 DIAGNOSIS — E559 Vitamin D deficiency, unspecified: Secondary | ICD-10-CM | POA: Insufficient documentation

## 2014-11-04 HISTORY — DX: Acute recurrent maxillary sinusitis: J01.01

## 2014-11-04 NOTE — Assessment & Plan Note (Addendum)
CXR today to ensure no infection, I do believe this is primarily because of uncontrolled allergies

## 2014-11-04 NOTE — Assessment & Plan Note (Signed)
Uncontrolled with excess cough, prednisone dose pack and phenergan DM

## 2014-11-04 NOTE — Assessment & Plan Note (Signed)
Uncontrolled pt is referred to Dr. Juanetta GoslingHawkins

## 2014-11-04 NOTE — Assessment & Plan Note (Signed)
Controlled, no change in medication [facility]

## 2014-11-04 NOTE — Assessment & Plan Note (Signed)
Symptomatic howvere, I do believe this is allergy mediated , will obtain sinus films

## 2014-11-04 NOTE — Assessment & Plan Note (Addendum)
Uncontrolled , neb treatment and depo medrol in office , followed by prednisone dose pack. Pt also referred to [name] who is her pulmonologist for improved management

## 2014-11-04 NOTE — Assessment & Plan Note (Signed)
Weekly vit d to be continued

## 2014-11-06 ENCOUNTER — Encounter (HOSPITAL_COMMUNITY): Payer: Self-pay | Admitting: *Deleted

## 2014-11-06 ENCOUNTER — Emergency Department (HOSPITAL_COMMUNITY): Payer: 59

## 2014-11-06 ENCOUNTER — Emergency Department (HOSPITAL_COMMUNITY)
Admission: EM | Admit: 2014-11-06 | Discharge: 2014-11-07 | Disposition: A | Discharge status: Home / Self Care | Payer: 59 | Attending: Emergency Medicine | Admitting: Emergency Medicine

## 2014-11-06 DIAGNOSIS — K219 Gastro-esophageal reflux disease without esophagitis: Secondary | ICD-10-CM | POA: Insufficient documentation

## 2014-11-06 DIAGNOSIS — Z862 Personal history of diseases of the blood and blood-forming organs and certain disorders involving the immune mechanism: Secondary | ICD-10-CM | POA: Insufficient documentation

## 2014-11-06 DIAGNOSIS — Z86718 Personal history of other venous thrombosis and embolism: Secondary | ICD-10-CM | POA: Insufficient documentation

## 2014-11-06 DIAGNOSIS — R0602 Shortness of breath: Secondary | ICD-10-CM

## 2014-11-06 DIAGNOSIS — Z792 Long term (current) use of antibiotics: Secondary | ICD-10-CM | POA: Diagnosis not present

## 2014-11-06 DIAGNOSIS — Z872 Personal history of diseases of the skin and subcutaneous tissue: Secondary | ICD-10-CM | POA: Diagnosis not present

## 2014-11-06 DIAGNOSIS — J45901 Unspecified asthma with (acute) exacerbation: Secondary | ICD-10-CM | POA: Diagnosis not present

## 2014-11-06 DIAGNOSIS — E785 Hyperlipidemia, unspecified: Secondary | ICD-10-CM | POA: Diagnosis not present

## 2014-11-06 DIAGNOSIS — Z87891 Personal history of nicotine dependence: Secondary | ICD-10-CM | POA: Insufficient documentation

## 2014-11-06 DIAGNOSIS — Z7952 Long term (current) use of systemic steroids: Secondary | ICD-10-CM | POA: Insufficient documentation

## 2014-11-06 LAB — COMPREHENSIVE METABOLIC PANEL
ALK PHOS: 94 U/L (ref 39–117)
ALT: <5 U/L (ref 0–35)
ANION GAP: 9 (ref 5–15)
AST: 22 U/L (ref 0–37)
Albumin: 4.1 g/dL (ref 3.5–5.2)
BUN: 19 mg/dL (ref 6–23)
CALCIUM: 9.1 mg/dL (ref 8.4–10.5)
CO2: 20 mmol/L (ref 19–32)
CREATININE: 1.16 mg/dL — AB (ref 0.50–1.10)
Chloride: 105 mmol/L (ref 96–112)
GFR calc non Af Amer: 58 mL/min — ABNORMAL LOW (ref >90)
GFR, EST AFRICAN AMERICAN: 67 mL/min — AB (ref >90)
GLUCOSE: 219 mg/dL — AB (ref 70–99)
Potassium: 4.1 mmol/L (ref 3.5–5.1)
SODIUM: 134 mmol/L — AB (ref 135–145)
TOTAL PROTEIN: 7.2 g/dL (ref 6.0–8.3)
Total Bilirubin: 0.5 mg/dL (ref 0.3–1.2)

## 2014-11-06 LAB — CBC WITH DIFFERENTIAL/PLATELET
Basophils Absolute: 0 10*3/uL (ref 0.0–0.1)
Basophils Relative: 0 % (ref 0–1)
EOS ABS: 0 10*3/uL (ref 0.0–0.7)
Eosinophils Relative: 0 % (ref 0–5)
HCT: 35.4 % — ABNORMAL LOW (ref 36.0–46.0)
HEMOGLOBIN: 12.3 g/dL (ref 12.0–15.0)
LYMPHS PCT: 6 % — AB (ref 12–46)
Lymphs Abs: 0.7 10*3/uL (ref 0.7–4.0)
MCH: 31.4 pg (ref 26.0–34.0)
MCHC: 34.7 g/dL (ref 30.0–36.0)
MCV: 90.3 fL (ref 78.0–100.0)
MONO ABS: 0.2 10*3/uL (ref 0.1–1.0)
Monocytes Relative: 1 % — ABNORMAL LOW (ref 3–12)
Neutro Abs: 11.6 10*3/uL — ABNORMAL HIGH (ref 1.7–7.7)
Neutrophils Relative %: 93 % — ABNORMAL HIGH (ref 43–77)
Platelets: 412 10*3/uL — ABNORMAL HIGH (ref 150–400)
RBC: 3.92 MIL/uL (ref 3.87–5.11)
RDW: 13.1 % (ref 11.5–15.5)
WBC: 12.5 10*3/uL — AB (ref 4.0–10.5)

## 2014-11-06 MED ORDER — IPRATROPIUM-ALBUTEROL 0.5-2.5 (3) MG/3ML IN SOLN
3.0000 mL | Freq: Once | RESPIRATORY_TRACT | Status: AC
  Administered 2014-11-06: 3 mL via RESPIRATORY_TRACT

## 2014-11-06 MED ORDER — IPRATROPIUM-ALBUTEROL 0.5-2.5 (3) MG/3ML IN SOLN
3.0000 mL | Freq: Once | RESPIRATORY_TRACT | Status: DC
  Filled 2014-11-06: qty 3

## 2014-11-06 MED ORDER — ALBUTEROL SULFATE (2.5 MG/3ML) 0.083% IN NEBU
2.5000 mg | INHALATION_SOLUTION | Freq: Once | RESPIRATORY_TRACT | Status: DC
Start: 2014-11-06 — End: 2014-11-06
  Filled 2014-11-06: qty 3

## 2014-11-06 MED ORDER — ALBUTEROL SULFATE (2.5 MG/3ML) 0.083% IN NEBU
2.5000 mg | INHALATION_SOLUTION | Freq: Once | RESPIRATORY_TRACT | Status: AC
  Administered 2014-11-06: 2.5 mg via RESPIRATORY_TRACT

## 2014-11-06 MED ORDER — IPRATROPIUM BROMIDE 0.02 % IN SOLN
0.5000 mg | Freq: Once | RESPIRATORY_TRACT | Status: DC
Start: 2014-11-06 — End: 2014-11-06

## 2014-11-06 MED ORDER — ALBUTEROL SULFATE (2.5 MG/3ML) 0.083% IN NEBU: 5.0000 mg | INHALATION_SOLUTION | Freq: Once | RESPIRATORY_TRACT | Status: DC

## 2014-11-06 NOTE — ED Provider Notes (Signed)
CSN: [id number]     Arrival date & time 11/06/14  2138 History  This chart was scribed for Ward GivensIva L Miri [name], MD by [name], ED Scribe. This patient was seen in room [facility]19[facility]19 and the patient's care was started at 12:15 AM.    Chief Complaint  Patient presents with  . Shortness of Breath   The history is provided by the patient. No language interpreter was used.    HPI Comments: Jodi [name] is a 39 y.o. female with a history of asthma who presents to the Emergency Department complaining of gradually worsening, intermittent shortness of breath that started 2 weeks ago. Pt states unproductive cough and fever of 100.4 that occurred 3-4 days ago as associated symptoms. She has tried Albuterol and breathing treatments at home PTA with no relief tonight, although they had been helping before. Pt saw Dr. Juanetta GoslingHawkins today and was given a steroid injection and started on a prednisone dose pack and she took 40 mg today. She states that she has a history of asthma and was hospitalized for similar symptoms in the past. She normally takes Advair and Singulair, but did not take Singulair today because she is scheduled for allergy testing in the morning. Pt denies chest pain, sore throat, sneezing, vomiting and diarrhea as associated symptoms. She has hoarseness which she attributes to her asthma flare.   PCP Dr [name] Pulmonologist [name]  Past Medical History  Diagnosis Date  . Asthma   . DVT (deep venous thrombosis) 10 yrs ago  . Asthma   . Allergic rhinitis   . Hyperlipidemia   . Shortness of breath     SOB even without exertion at times  . GERD (gastroesophageal reflux disease)   . IBS (irritable bowel syndrome)   . 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 Anemia   . Eczema   . Abdominal pain, chronic, right upper quadrant    Past Surgical History  Procedure Laterality Date  . Blood clot removed from lower abdomen  2000  . Cyst removed from ovary and fluid removed from  tubes  2000  . Partial hysterectomy  2009  . Esophagogastroduodenoscopy  10/22/2012    Procedure: ESOPHAGOGASTRODUODENOSCOPY (EGD);  Surgeon: [name], MD;  Location: [facility];  Service: Endoscopy;  Laterality: N/A;  100  . Colonoscopy  11/04/2012    Procedure: COLONOSCOPY;  Surgeon: [name], MD;  Location: [facility];  Service: Endoscopy;  Laterality: N/A;  915  . Abdominal hysterectomy  4-5 yrs ago  . Tubal ligation  1999  . Cholecystectomy  11/12/2012    Procedure: LAPAROSCOPIC CHOLECYSTECTOMY;  Surgeon: Dalia HeadingMark A Jenkins, MD;  Location: [facility];  Service: General;  Laterality: N/A;   Family History  Problem Relation Age of Onset  . Asthma Mother   . Hypertension Mother   . Bronchiolitis Mother   . Hypertension Father   . Hyperlipidemia Father     recurrent rectum polyps   . Diabetes Sister   . Lupus Sister   . Depression Sister    History  Substance Use Topics  . Smoking status: Former Smoker -- 0.25 packs/day for 10 years    Types: Cigarettes    Quit date: 10/13/2008  . Smokeless tobacco: Never Used  . Alcohol Use: No     OB History    No data available     Review of Systems  Constitutional: Positive for fever.  HENT: Negative for sore throat.  Respiratory: Positive for cough and shortness of breath.   Gastrointestinal: Negative for vomiting and diarrhea.  All other systems reviewed and are negative.   Allergies  Compazine  Home Medications   Prior to Admission medications   Medication Sig Start Date End Date Taking? Authorizing Provider  ADVAIR HFA 230-21 MCG/ACT inhaler INHALE TWO PUFFS BY MOUTH TWICE DAILY 07/25/14  Yes Kerri Perches, MD  albuterol (PROVENTIL HFA;VENTOLIN HFA) 108 (90 BASE) MCG/ACT inhaler Inhale 2 puffs into the lungs every 4 (four) hours as needed for wheezing or shortness of breath. 01/05/14  Yes Kerri Perches, MD  desipramine (NORPRAMIN) 50 MG tablet Take 50 mg by mouth at bedtime.   Yes Historical Provider,  MD  fluticasone (FLONASE) 50 MCG/ACT nasal spray USE TWO SPRAY(S) IN EACH NOSTRIL ONCE DAILY 06/20/14  Yes Kerri Perches, MD  ipratropium-albuterol (DUONEB) 0.5-2.5 (3) MG/3ML SOLN Take 3 mLs by nebulization every 6 (six) hours as needed.   Yes Historical Provider, MD  levocetirizine (XYZAL) 5 MG tablet TAKE ONE TABLET BY MOUTH ONCE DAILY IN THE EVENING 07/25/14  Yes Kerri Perches, MD  montelukast (SINGULAIR) 10 MG tablet TAKE ONE TABLET BY MOUTH ONCE DAILY IN THE MORNING 09/29/14  Yes Kerri Perches, MD  Multiple Vitamins-Minerals (ONE DAILY FOR WOMEN) TABS Take 1 tablet by mouth every morning.   Yes Historical Provider, MD  omeprazole (PRILOSEC) 20 MG capsule Take 1 capsule (20 mg total) by mouth daily. 01/05/14  Yes Len Blalock, NP  predniSONE (DELTASONE) 10 MG tablet Take 10-40 mg by mouth See admin instructions. Starting on 11/06/2014 Take 40mg  for 3 days, then 30mg  for 3 days, then 20mg , etc until complete   Yes Historical Provider, MD  promethazine-dextromethorphan (PROMETHAZINE-DM) 6.25-15 MG/5ML syrup Take 5 mLs by mouth at bedtime as needed for cough. 10/31/14  Yes Kerri Perches, MD  ranitidine (ZANTAC) 150 MG tablet TAKE ONE TABLET BY MOUTH TWICE DAILY 08/28/14  Yes Kerri Perches, MD  azithromycin (ZITHROMAX) 250 MG tablet Take 2 po the first day then once a day for the next 4 days. 11/07/14   Ward [name], MD  predniSONE (STERAPRED UNI-PAK) 5 MG TABS tablet Take 1 tablet (5 mg total) by mouth as directed. Patient not taking: Reported on 11/06/2014 10/31/14   Kerri Perches, MD  Vitamin D, Ergocalciferol, (DRISDOL) 50000 UNITS CAPS capsule Take 1 capsule (50,000 Units total) by mouth every 7 (seven) days. 11/01/14   Kerri Perches, MD   BP 141/91 mmHg  Pulse 105  Temp(Src) 98.4 F (36.9 C) (Oral)  Resp 20  Wt 159 lb (72.122 kg)  SpO2 100%  LMP 05/17/2012  Vital signs normal except tachycardia  Physical Exam  Constitutional: She is oriented to person,  place, and time. She appears well-developed and well-nourished.  Non-toxic appearance. She does not appear ill. No distress.  Hoarse voice  HENT:  Head: Normocephalic and atraumatic.  Right Ear: External ear normal.  Left Ear: External ear normal.  Nose: Nose normal. No mucosal edema or rhinorrhea.  Mouth/Throat: Oropharynx is clear and moist and mucous membranes are normal. No dental abscesses or uvula swelling.  Eyes: Conjunctivae and EOM are normal. Pupils are equal, round, and reactive to light.  Neck: Normal range of motion and full passive range of motion without pain. Neck supple.  Cardiovascular: Normal rate, regular rhythm and normal heart sounds.  Exam reveals no gallop and no friction rub.   No murmur heard. Pulmonary/Chest: Effort normal  and breath sounds normal. No respiratory distress. She has no wheezes. She has no rhonchi. She has no rales. She exhibits no tenderness and no crepitus.  SOB with talking; can only say a few words at a time before she has to gasp for breath; breath sounds greatly diminished  Abdominal: Soft. Normal appearance and bowel sounds are normal. She exhibits no distension. There is no tenderness. There is no rebound and no guarding.  Musculoskeletal: Normal range of motion. She exhibits no edema or tenderness.  Moves all extremities well.   Neurological: She is alert and oriented to person, place, and time. She has normal strength. No cranial nerve deficit.  Skin: Skin is warm, dry and intact. No rash noted. No erythema. No pallor.  Psychiatric: She has a normal mood and affect. Her speech is normal and behavior is normal. Her mood appears not anxious.  Nursing note and vitals reviewed.   ED Course  Procedures (including critical care time)  Medications  ipratropium-albuterol (DUONEB) 0.5-2.5 (3) MG/3ML nebulizer solution 3 mL (3 mLs Nebulization Given 11/06/14 2222)  albuterol (PROVENTIL) (2.5 MG/3ML) 0.083% nebulizer solution 2.5 mg (2.5 mg  Nebulization Given 11/06/14 2222)  albuterol (PROVENTIL,VENTOLIN) solution continuous neb (15 mg/hr Nebulization Given 11/07/14 0034)  ipratropium (ATROVENT) nebulizer solution 0.5 mg (0.5 mg Nebulization Given 11/07/14 0034)    DIAGNOSTIC STUDIES: Oxygen Saturation is 100% on RA, normal by my interpretation.    COORDINATION OF CARE: 12:24 AM Discussed treatment plan with pt which includes lab work and chest x-ray. Pt agreed to plan.  02:10 patient has almost finished her continuous nebulizer. When I reexamined her she has mildly improved air movement however her air movement is very diminished.  03:30 pt states she is feeling better, still has diffusely diminished breath sounds, but better than initially. Patient was ambulated by nursing staff with her pulse ox remaining 96-98% on room air. She tolerated it very well.  Labs Review Results for orders placed or performed during the hospital encounter of 11/06/14  CBC with Differential/Platelet  Result Value Ref Range   WBC 12.5 (H) 4.0 - 10.5 K/uL   RBC 3.92 3.87 - 5.11 MIL/uL   Hemoglobin 12.3 12.0 - 15.0 g/dL   HCT 16.1 (L) 09.6 - 04.5 %   MCV 90.3 78.0 - 100.0 fL   MCH 31.4 26.0 - 34.0 pg   MCHC 34.7 30.0 - 36.0 g/dL   RDW 40.9 81.1 - 91.4 %   Platelets 412 (H) 150 - 400 K/uL   Neutrophils Relative % 93 (H) 43 - 77 %   Neutro Abs 11.6 (H) 1.7 - 7.7 K/uL   Lymphocytes Relative 6 (L) 12 - 46 %   Lymphs Abs 0.7 0.7 - 4.0 K/uL   Monocytes Relative 1 (L) 3 - 12 %   Monocytes Absolute 0.2 0.1 - 1.0 K/uL   Eosinophils Relative 0 0 - 5 %   Eosinophils Absolute 0.0 0.0 - 0.7 K/uL   Basophils Relative 0 0 - 1 %   Basophils Absolute 0.0 0.0 - 0.1 K/uL  Comprehensive metabolic panel  Result Value Ref Range   Sodium 134 (L) 135 - 145 mmol/L   Potassium 4.1 3.5 - 5.1 mmol/L   Chloride 105 96 - 112 mmol/L   CO2 20 19 - 32 mmol/L   Glucose, Bld 219 (H) 70 - 99 mg/dL   BUN 19 6 - 23 mg/dL   Creatinine, Ser 7.82 (H) 0.50 - 1.10 mg/dL    Calcium 9.1 8.4 -  10.5 mg/dL   Total Protein 7.2 6.0 - 8.3 g/dL   Albumin 4.1 3.5 - 5.2 g/dL   AST 22 0 - 37 U/L   ALT <5 0 - 35 U/L   Alkaline Phosphatase 94 39 - 117 U/L   Total Bilirubin 0.5 0.3 - 1.2 mg/dL   GFR calc non Af Amer 58 (L) >90 mL/min   GFR calc Af Amer 67 (L) >90 mL/min   Anion gap 9 5 - 15  D-dimer, quantitative  Result Value Ref Range   D-Dimer, Quant 0.82 (H) 0.00 - 0.48 ug/mL-FEU   Laboratory interpretation all normal except leukocytosis, elevated Ddimer (does not fit symptoms     Imaging Review Dg Chest Port 1 View  11/06/2014   CLINICAL DATA:  Asthma flare beginning 20 min prior to arrival in the emergency department.  EXAM: PORTABLE CHEST - 1 VIEW  COMPARISON:  Chest radiograph October 31, 2014  FINDINGS: Cardiomediastinal silhouette is unremarkable. The lungs are clear without pleural effusions or focal consolidations. Trachea projects midline and there is no pneumothorax. Soft tissue planes and included osseous structures are non-suspicious.  IMPRESSION: No acute cardiopulmonary process ; stable chest radiograph from October 31, 2014.   Electronically Signed   By: Awilda Metro   On: 11/06/2014 22:58     EKG Interpretation None      MDM   Final diagnoses:  Asthma exacerbation    New Prescriptions   AZITHROMYCIN (ZITHROMAX) 250 MG TABLET    Take 2 po the first day then once a day for the next 4 days.    Plan discharge  Devoria Albe, MD, FACEP   I personally performed the services described in this documentation, which was scribed in my presence. The recorded information has been reviewed and considered.  Devoria Albe, MD, FACEP   Ward [name], MD 11/07/14 [phone]

## 2014-11-06 NOTE — ED Notes (Signed)
Asthma flare, onset 20 min pta.  Alert, no fever.

## 2014-11-06 NOTE — ED Notes (Signed)
Seen by MD todays and given steroid injection

## 2014-11-06 NOTE — ED Notes (Signed)
Pt with a smile stats feeling a little better.

## 2014-11-07 LAB — D-DIMER, QUANTITATIVE (NOT AT ARMC): D-Dimer, Quant: 0.82 ug/mL-FEU — ABNORMAL HIGH (ref 0.00–0.48)

## 2014-11-07 MED ORDER — AZITHROMYCIN 250 MG PO TABS: ORAL_TABLET | ORAL | Status: DC

## 2014-11-07 MED ORDER — ALBUTEROL (5 MG/ML) CONTINUOUS INHALATION SOLN
15.0000 mg/h | INHALATION_SOLUTION | Freq: Once | RESPIRATORY_TRACT | Status: AC
  Administered 2014-11-07: 15 mg/h via RESPIRATORY_TRACT
  Filled 2014-11-07: qty 20

## 2014-11-07 MED ORDER — IPRATROPIUM BROMIDE 0.02 % IN SOLN
0.5000 mg | Freq: Once | RESPIRATORY_TRACT | Status: AC
  Administered 2014-11-07: 0.5 mg via RESPIRATORY_TRACT
  Filled 2014-11-07: qty 2.5

## 2014-11-07 NOTE — ED Notes (Signed)
Pt ambulated around the nurses station. O2 SATS remained 96 to 98%. Pt does have a dry cough, advises she has had it for awhile. Pt returned to room w/ no problems.

## 2014-11-07 NOTE — Discharge Instructions (Signed)
Continue using her inhaler and your nebulizer for your wheezing and shortness of breath. Continue your prednisone taper that he started today. Take the antibiotic until gone. Please return if you feel worse in any way. Asthma Attack Prevention Although there is no way to prevent asthma from starting, you can take steps to control the disease and reduce its symptoms. Learn about your asthma and how to control it. Take an active role to control your asthma by working with your health care provider to create and follow an asthma action plan. An asthma action plan guides you in:  Taking your medicines properly.  Avoiding things that set off your asthma or make your asthma worse (asthma triggers).  Tracking your level of asthma control.  Responding to worsening asthma.  Seeking emergency care when needed. To track your asthma, keep records of your symptoms, check your peak flow number using a handheld device that shows how well air moves out of your lungs (peak flow meter), and get regular asthma checkups.  WHAT ARE SOME WAYS TO PREVENT AN ASTHMA ATTACK?  Take medicines as directed by your health care provider.  Keep track of your asthma symptoms and level of control.  With your health care provider, write a detailed plan for taking medicines and managing an asthma attack. Then be sure to follow your action plan. Asthma is an ongoing condition that needs regular monitoring and treatment.  Identify and avoid asthma triggers. Many outdoor allergens and irritants (such as pollen, mold, cold air, and air pollution) can trigger asthma attacks. Find out what your asthma triggers are and take steps to avoid them.  Monitor your breathing. Learn to recognize warning signs of an attack, such as coughing, wheezing, or shortness of breath. Your lung function may decrease before you notice any signs or symptoms, so regularly measure and record your peak airflow with a home peak flow meter.  Identify and  treat attacks early. If you act quickly, you are less likely to have a severe attack. You will also need less medicine to control your symptoms. When your peak flow measurements decrease and alert you to an upcoming attack, take your medicine as instructed and immediately stop any activity that may have triggered the attack. If your symptoms do not improve, get medical help.  Pay attention to increasing quick-relief inhaler use. If you find yourself relying on your quick-relief inhaler, your asthma is not under control. See your health care provider about adjusting your treatment. WHAT CAN MAKE MY SYMPTOMS WORSE? A number of common things can set off or make your asthma symptoms worse and cause temporary increased inflammation of your airways. Keep track of your asthma symptoms for several weeks, detailing all the environmental and emotional factors that are linked with your asthma. When you have an asthma attack, go back to your asthma diary to see which factor, or combination of factors, might have contributed to it. Once you know what these factors are, you can take steps to control many of them. If you have allergies and asthma, it is important to take asthma prevention steps at home. Minimizing contact with the substance to which you are allergic will help prevent an asthma attack. Some triggers and ways to avoid these triggers are: Animal Dander:  Some people are allergic to the flakes of skin or dried saliva from animals with fur or feathers.   There is no such thing as a hypoallergenic dog or cat breed. All dogs or cats can cause allergies, even  if they don't shed.  Keep these pets out of your home.  If you are not able to keep a pet outdoors, keep the pet out of your bedroom and other sleeping areas at all times, and keep the door closed.  Remove carpets and furniture covered with cloth from your home. If that is not possible, keep the pet away from fabric-covered furniture and carpets. Dust  Mites: Many people with asthma are allergic to dust mites. Dust mites are tiny bugs that are found in every home in mattresses, pillows, carpets, fabric-covered furniture, bedcovers, clothes, stuffed toys, and other fabric-covered items.   Cover your mattress in a special dust-proof cover.  Cover your pillow in a special dust-proof cover, or wash the pillow each week in hot water. Water must be hotter than 130 F (54.4 C) to kill dust mites. Cold or warm water used with detergent and bleach can also be effective.  Wash the sheets and blankets on your bed each week in hot water.  Try not to sleep or lie on cloth-covered cushions.  Call ahead when traveling and ask for a smoke-free hotel room. Bring your own bedding and pillows in case the hotel only supplies feather pillows and down comforters, which may contain dust mites and cause asthma symptoms.  Remove carpets from your bedroom and those laid on concrete, if you can.  Keep stuffed toys out of the bed, or wash the toys weekly in hot water or cooler water with detergent and bleach. Cockroaches: Many people with asthma are allergic to the droppings and remains of cockroaches.   Keep food and garbage in closed containers. Never leave food out.  Use poison baits, traps, powders, gels, or paste (for example, boric acid).  If a spray is used to kill cockroaches, stay out of the room until the odor goes away. Indoor Mold:  Fix leaky faucets, pipes, or other sources of water that have mold around them.  Clean floors and moldy surfaces with a fungicide or diluted bleach.  Avoid using humidifiers, vaporizers, or swamp coolers. These can spread molds through the air. Pollen and Outdoor Mold:  When pollen or mold spore counts are high, try to keep your windows closed.  Stay indoors with windows closed from late morning to afternoon. Pollen and some mold spore counts are highest at that time.  Ask your health care provider whether you  need to take anti-inflammatory medicine or increase your dose of the medicine before your allergy season starts. Other Irritants to Avoid:  Tobacco smoke is an irritant. If you smoke, ask your health care provider how you can quit. Ask family members to quit smoking, too. Do not allow smoking in your home or car.  If possible, do not use a wood-burning stove, kerosene heater, or fireplace. Minimize exposure to all sources of smoke, including incense, candles, fires, and fireworks.  Try to stay away from strong odors and sprays, such as perfume, talcum powder, hair spray, and paints.  Decrease humidity in your home and use an indoor air cleaning device. Reduce indoor humidity to below 60%. Dehumidifiers or central air conditioners can do this.  Decrease house dust exposure by changing furnace and air cooler filters frequently.  Try to have someone else vacuum for you once or twice a week. Stay out of rooms while they are being vacuumed and for a short while afterward.  If you vacuum, use a dust mask from a hardware store, a double-layered or microfilter vacuum cleaner bag, or a  vacuum cleaner with a HEPA filter.  Sulfites in foods and beverages can be irritants. Do not drink beer or wine or eat dried fruit, processed potatoes, or shrimp if they cause asthma symptoms.  Cold air can trigger an asthma attack. Cover your nose and mouth with a scarf on cold or windy days.  Several health conditions can make asthma more difficult to manage, including a runny nose, sinus infections, reflux disease, psychological stress, and sleep apnea. Work with your health care provider to manage these conditions.  Avoid close contact with people who have a respiratory infection such as a cold or the flu, since your asthma symptoms may get worse if you catch the infection. Wash your hands thoroughly after touching items that may have been handled by people with a respiratory infection.  Get a flu shot every year  to protect against the flu virus, which often makes asthma worse for days or weeks. Also get a pneumonia shot if you have not previously had one. Unlike the flu shot, the pneumonia shot does not need to be given yearly. Medicines:  Talk to your health care provider about whether it is safe for you to take aspirin or non-steroidal anti-inflammatory medicines (NSAIDs). In a small number of people with asthma, aspirin and NSAIDs can cause asthma attacks. These medicines must be avoided by people who have known aspirin-sensitive asthma. It is important that people with aspirin-sensitive asthma read labels of all over-the-counter medicines used to treat pain, colds, coughs, and fever.  Beta-blockers and ACE inhibitors are other medicines you should discuss with your health care provider. HOW CAN I FIND OUT WHAT I AM ALLERGIC TO? Ask your asthma health care provider about allergy skin testing or blood testing (the RAST test) to identify the allergens to which you are sensitive. If you are found to have allergies, the most important thing to do is to try to avoid exposure to any allergens that you are sensitive to as much as possible. Other treatments for allergies, such as medicines and allergy shots (immunotherapy) are available.  CAN I EXERCISE? Follow your health care provider's advice regarding asthma treatment before exercising. It is important to maintain a regular exercise program, but vigorous exercise or exercise in cold, humid, or dry environments can cause asthma attacks, especially for those people who have exercise-induced asthma. Document Released: 09/17/2009 Document Revised: 10/04/2013 Document Reviewed: 04/06/2013 [employer] Patient Information 2015 [place], [place]. This information is not intended to replace advice given to you by your health care provider. Make sure you discuss any questions you have with your health care provider.

## 2014-11-07 NOTE — ED Notes (Signed)
Breathing tx still in progress. Pt states feeling better. No needs voiced at this time.

## 2014-11-07 NOTE — ED Notes (Signed)
Pt alert & oriented x4, stable gait. Patient  given discharge instructions, paperwork & prescription(s). Registration met pt in room to finish additional paperwork. Patient verbalized understanding. Pt left department w/ no further questions.

## 2014-11-10 ENCOUNTER — Encounter: Payer: 59 | Admitting: Family Medicine

## 2014-11-17 ENCOUNTER — Encounter (HOSPITAL_COMMUNITY): Payer: Self-pay | Admitting: Emergency Medicine

## 2014-11-17 ENCOUNTER — Observation Stay (HOSPITAL_COMMUNITY)
Admission: EM | Admit: 2014-11-17 | Discharge: 2014-11-18 | Disposition: A | Discharge status: Home / Self Care | Payer: 59 | Attending: Internal Medicine | Admitting: Internal Medicine

## 2014-11-17 ENCOUNTER — Observation Stay (HOSPITAL_COMMUNITY): Payer: 59

## 2014-11-17 ENCOUNTER — Emergency Department (HOSPITAL_COMMUNITY): Payer: 59

## 2014-11-17 DIAGNOSIS — J849 Interstitial pulmonary disease, unspecified: Secondary | ICD-10-CM | POA: Diagnosis not present

## 2014-11-17 DIAGNOSIS — Z79899 Other long term (current) drug therapy: Secondary | ICD-10-CM | POA: Insufficient documentation

## 2014-11-17 DIAGNOSIS — Z872 Personal history of diseases of the skin and subcutaneous tissue: Secondary | ICD-10-CM | POA: Diagnosis not present

## 2014-11-17 DIAGNOSIS — R739 Hyperglycemia, unspecified: Secondary | ICD-10-CM | POA: Diagnosis present

## 2014-11-17 DIAGNOSIS — R0602 Shortness of breath: Secondary | ICD-10-CM

## 2014-11-17 DIAGNOSIS — M25561 Pain in right knee: Secondary | ICD-10-CM | POA: Diagnosis present

## 2014-11-17 DIAGNOSIS — M1990 Unspecified osteoarthritis, unspecified site: Secondary | ICD-10-CM | POA: Diagnosis not present

## 2014-11-17 DIAGNOSIS — K219 Gastro-esophageal reflux disease without esophagitis: Secondary | ICD-10-CM | POA: Diagnosis not present

## 2014-11-17 DIAGNOSIS — K589 Irritable bowel syndrome without diarrhea: Secondary | ICD-10-CM | POA: Diagnosis not present

## 2014-11-17 DIAGNOSIS — M79606 Pain in leg, unspecified: Secondary | ICD-10-CM

## 2014-11-17 DIAGNOSIS — Z87891 Personal history of nicotine dependence: Secondary | ICD-10-CM | POA: Diagnosis not present

## 2014-11-17 DIAGNOSIS — E785 Hyperlipidemia, unspecified: Secondary | ICD-10-CM | POA: Insufficient documentation

## 2014-11-17 DIAGNOSIS — Z86718 Personal history of other venous thrombosis and embolism: Secondary | ICD-10-CM | POA: Diagnosis not present

## 2014-11-17 DIAGNOSIS — M25562 Pain in left knee: Secondary | ICD-10-CM

## 2014-11-17 DIAGNOSIS — J45901 Unspecified asthma with (acute) exacerbation: Secondary | ICD-10-CM | POA: Diagnosis not present

## 2014-11-17 DIAGNOSIS — J4541 Moderate persistent asthma with (acute) exacerbation: Secondary | ICD-10-CM

## 2014-11-17 DIAGNOSIS — J0101 Acute recurrent maxillary sinusitis: Secondary | ICD-10-CM | POA: Insufficient documentation

## 2014-11-17 DIAGNOSIS — D649 Anemia, unspecified: Secondary | ICD-10-CM | POA: Insufficient documentation

## 2014-11-17 DIAGNOSIS — J45909 Unspecified asthma, uncomplicated: Secondary | ICD-10-CM | POA: Insufficient documentation

## 2014-11-17 DIAGNOSIS — G8929 Other chronic pain: Secondary | ICD-10-CM | POA: Insufficient documentation

## 2014-11-17 DIAGNOSIS — J45991 Cough variant asthma: Secondary | ICD-10-CM | POA: Diagnosis present

## 2014-11-17 DIAGNOSIS — R0789 Other chest pain: Secondary | ICD-10-CM | POA: Diagnosis not present

## 2014-11-17 DIAGNOSIS — T50905A Adverse effect of unspecified drugs, medicaments and biological substances, initial encounter: Secondary | ICD-10-CM

## 2014-11-17 HISTORY — DX: Acute recurrent maxillary sinusitis: J01.01

## 2014-11-17 HISTORY — DX: Hyperlipidemia, unspecified: E78.5

## 2014-11-17 HISTORY — DX: Chronic pain syndrome: G89.4

## 2014-11-17 LAB — CBC WITH DIFFERENTIAL/PLATELET
Basophils Absolute: 0 10*3/uL (ref 0.0–0.1)
Basophils Relative: 0 % (ref 0–1)
EOS ABS: 0 10*3/uL (ref 0.0–0.7)
Eosinophils Relative: 0 % (ref 0–5)
HEMATOCRIT: 35.8 % — AB (ref 36.0–46.0)
HEMOGLOBIN: 12.2 g/dL (ref 12.0–15.0)
LYMPHS PCT: 9 % — AB (ref 12–46)
Lymphs Abs: 1.3 10*3/uL (ref 0.7–4.0)
MCH: 31.1 pg (ref 26.0–34.0)
MCHC: 34.1 g/dL (ref 30.0–36.0)
MCV: 91.3 fL (ref 78.0–100.0)
Monocytes Absolute: 0.5 10*3/uL (ref 0.1–1.0)
Monocytes Relative: 3 % (ref 3–12)
NEUTROS ABS: 13 10*3/uL — AB (ref 1.7–7.7)
Neutrophils Relative %: 88 % — ABNORMAL HIGH (ref 43–77)
PLATELETS: 368 10*3/uL (ref 150–400)
RBC: 3.92 MIL/uL (ref 3.87–5.11)
RDW: 13.9 % (ref 11.5–15.5)
WBC: 14.8 10*3/uL — ABNORMAL HIGH (ref 4.0–10.5)

## 2014-11-17 LAB — BLOOD GAS, ARTERIAL
Acid-Base Excess: 3.7 mmol/L — ABNORMAL HIGH (ref 0.0–2.0)
Bicarbonate: 27.8 mEq/L — ABNORMAL HIGH (ref 20.0–24.0)
FIO2: 0.21 %
O2 Saturation: 96.1 %
Patient temperature: 37
TCO2: 24.8 mmol/L (ref 0–100)
pCO2 arterial: 43.2 mmHg (ref 35.0–45.0)
pH, Arterial: 7.425 (ref 7.350–7.450)
pO2, Arterial: 87.7 mmHg (ref 80.0–100.0)

## 2014-11-17 LAB — GLUCOSE, CAPILLARY
GLUCOSE-CAPILLARY: 201 mg/dL — AB (ref 70–99)
Glucose-Capillary: 104 mg/dL — ABNORMAL HIGH (ref 70–99)

## 2014-11-17 LAB — BASIC METABOLIC PANEL
ANION GAP: 6 (ref 5–15)
BUN: 17 mg/dL (ref 6–23)
CALCIUM: 9 mg/dL (ref 8.4–10.5)
CO2: 28 mmol/L (ref 19–32)
Chloride: 104 mmol/L (ref 96–112)
Creatinine, Ser: 0.97 mg/dL (ref 0.50–1.10)
GFR calc Af Amer: 84 mL/min — ABNORMAL LOW (ref >90)
GFR calc non Af Amer: 72 mL/min — ABNORMAL LOW (ref >90)
GLUCOSE: 137 mg/dL — AB (ref 70–99)
POTASSIUM: 4.2 mmol/L (ref 3.5–5.1)
Sodium: 138 mmol/L (ref 135–145)

## 2014-11-17 MED ORDER — IPRATROPIUM-ALBUTEROL 0.5-2.5 (3) MG/3ML IN SOLN
3.0000 mL | Freq: Once | RESPIRATORY_TRACT | Status: AC
  Administered 2014-11-17: 3 mL via RESPIRATORY_TRACT
  Filled 2014-11-17: qty 3

## 2014-11-17 MED ORDER — METHYLPREDNISOLONE SODIUM SUCC 125 MG IJ SOLR
60.0000 mg | Freq: Two times a day (BID) | INTRAMUSCULAR | Status: DC
  Administered 2014-11-17 – 2014-11-18 (×2): 60 mg via INTRAVENOUS
  Filled 2014-11-17 (×2): qty 2

## 2014-11-17 MED ORDER — LEVOFLOXACIN 500 MG PO TABS
500.0000 mg | ORAL_TABLET | Freq: Every day | ORAL | Status: DC
  Administered 2014-11-17 – 2014-11-18 (×2): 500 mg via ORAL
  Filled 2014-11-17 (×2): qty 1

## 2014-11-17 MED ORDER — FAMOTIDINE 20 MG PO TABS
20.0000 mg | ORAL_TABLET | Freq: Every day | ORAL | Status: DC
  Administered 2014-11-17 – 2014-11-18 (×2): 20 mg via ORAL
  Filled 2014-11-17 (×2): qty 1

## 2014-11-17 MED ORDER — ACETAMINOPHEN 325 MG PO TABS
650.0000 mg | ORAL_TABLET | Freq: Four times a day (QID) | ORAL | Status: DC | PRN
  Administered 2014-11-18: 650 mg via ORAL
  Filled 2014-11-17: qty 2

## 2014-11-17 MED ORDER — IOHEXOL 350 MG/ML SOLN
100.0000 mL | Freq: Once | INTRAVENOUS | Status: AC | PRN
  Administered 2014-11-17: 100 mL via INTRAVENOUS

## 2014-11-17 MED ORDER — LEVALBUTEROL HCL 0.63 MG/3ML IN NEBU: 0.6300 mg | INHALATION_SOLUTION | RESPIRATORY_TRACT | Status: DC | PRN

## 2014-11-17 MED ORDER — LORATADINE 10 MG PO TABS
10.0000 mg | ORAL_TABLET | Freq: Every evening | ORAL | Status: DC
  Administered 2014-11-17: 10 mg via ORAL
  Filled 2014-11-17: qty 1

## 2014-11-17 MED ORDER — GUAIFENESIN-DM 100-10 MG/5ML PO SYRP: 5.0000 mL | ORAL_SOLUTION | ORAL | Status: DC | PRN

## 2014-11-17 MED ORDER — OXYCODONE HCL 5 MG PO TABS: 5.0000 mg | ORAL_TABLET | ORAL | Status: DC | PRN

## 2014-11-17 MED ORDER — PANTOPRAZOLE SODIUM 40 MG PO TBEC
40.0000 mg | DELAYED_RELEASE_TABLET | Freq: Every day | ORAL | Status: DC
  Administered 2014-11-17 – 2014-11-18 (×2): 40 mg via ORAL
  Filled 2014-11-17 (×2): qty 1

## 2014-11-17 MED ORDER — LEVALBUTEROL HCL 0.63 MG/3ML IN NEBU
0.6300 mg | INHALATION_SOLUTION | RESPIRATORY_TRACT | Status: DC
  Administered 2014-11-17 – 2014-11-18 (×5): 0.63 mg via RESPIRATORY_TRACT
  Filled 2014-11-17 (×6): qty 3

## 2014-11-17 MED ORDER — HYDROCODONE-ACETAMINOPHEN 5-325 MG PO TABS
1.0000 | ORAL_TABLET | Freq: Once | ORAL | Status: AC
  Administered 2014-11-17: 1 via ORAL
  Filled 2014-11-17: qty 1

## 2014-11-17 MED ORDER — DICLOFENAC SODIUM 1 % TD GEL
4.0000 g | Freq: Three times a day (TID) | TRANSDERMAL | Status: DC
  Administered 2014-11-17 – 2014-11-18 (×3): 4 g via TOPICAL
  Filled 2014-11-17: qty 100

## 2014-11-17 MED ORDER — DESIPRAMINE HCL 25 MG PO TABS
50.0000 mg | ORAL_TABLET | Freq: Every day | ORAL | Status: DC
  Administered 2014-11-17: 50 mg via ORAL
  Filled 2014-11-17 (×3): qty 2

## 2014-11-17 MED ORDER — PREDNISONE 50 MG PO TABS
60.0000 mg | ORAL_TABLET | Freq: Once | ORAL | Status: AC
  Administered 2014-11-17: 60 mg via ORAL
  Filled 2014-11-17 (×2): qty 1

## 2014-11-17 MED ORDER — ONDANSETRON HCL 4 MG/2ML IJ SOLN: 4.0000 mg | Freq: Four times a day (QID) | INTRAMUSCULAR | Status: DC | PRN

## 2014-11-17 MED ORDER — MONTELUKAST SODIUM 10 MG PO TABS
10.0000 mg | ORAL_TABLET | Freq: Every day | ORAL | Status: DC
  Administered 2014-11-17 – 2014-11-18 (×2): 10 mg via ORAL
  Filled 2014-11-17 (×2): qty 1

## 2014-11-17 MED ORDER — ACETAMINOPHEN 650 MG RE SUPP: 650.0000 mg | Freq: Four times a day (QID) | RECTAL | Status: DC | PRN

## 2014-11-17 MED ORDER — INSULIN ASPART 100 UNIT/ML ~~LOC~~ SOLN
0.0000 [IU] | Freq: Three times a day (TID) | SUBCUTANEOUS | Status: DC
  Administered 2014-11-18: 2 [IU] via SUBCUTANEOUS

## 2014-11-17 MED ORDER — ADULT MULTIVITAMIN W/MINERALS CH
1.0000 | ORAL_TABLET | Freq: Every morning | ORAL | Status: DC
  Administered 2014-11-17 – 2014-11-18 (×2): 1 via ORAL
  Filled 2014-11-17 (×2): qty 1

## 2014-11-17 MED ORDER — FLUTICASONE PROPIONATE 50 MCG/ACT NA SUSP
2.0000 | Freq: Every day | NASAL | Status: DC
  Administered 2014-11-17 – 2014-11-18 (×2): 2 via NASAL
  Filled 2014-11-17: qty 16

## 2014-11-17 MED ORDER — BENZONATATE 100 MG PO CAPS
100.0000 mg | ORAL_CAPSULE | Freq: Three times a day (TID) | ORAL | Status: DC
  Administered 2014-11-17 – 2014-11-18 (×3): 100 mg via ORAL
  Filled 2014-11-17 (×3): qty 1

## 2014-11-17 MED ORDER — POTASSIUM CHLORIDE IN NACL 20-0.9 MEQ/L-% IV SOLN
INTRAVENOUS | Status: DC
Start: 2014-11-17 — End: 2014-11-18
  Administered 2014-11-17 – 2014-11-18 (×2): via INTRAVENOUS

## 2014-11-17 MED ORDER — VITAMIN D (ERGOCALCIFEROL) 1.25 MG (50000 UNIT) PO CAPS: 50000.0000 [IU] | ORAL_CAPSULE | ORAL | Status: DC

## 2014-11-17 MED ORDER — ENOXAPARIN SODIUM 40 MG/0.4ML ~~LOC~~ SOLN
40.0000 mg | SUBCUTANEOUS | Status: DC
  Administered 2014-11-17: 40 mg via SUBCUTANEOUS
  Filled 2014-11-17: qty 0.4

## 2014-11-17 MED ORDER — MOMETASONE FURO-FORMOTEROL FUM 200-5 MCG/ACT IN AERO
2.0000 | INHALATION_SPRAY | Freq: Two times a day (BID) | RESPIRATORY_TRACT | Status: DC
  Administered 2014-11-17 – 2014-11-18 (×2): 2 via RESPIRATORY_TRACT
  Filled 2014-11-17: qty 8.8

## 2014-11-17 MED ORDER — ALUM & MAG HYDROXIDE-SIMETH 200-200-20 MG/5ML PO SUSP: 30.0000 mL | Freq: Four times a day (QID) | ORAL | Status: DC | PRN

## 2014-11-17 MED ORDER — ONDANSETRON HCL 4 MG PO TABS: 4.0000 mg | ORAL_TABLET | Freq: Four times a day (QID) | ORAL | Status: DC | PRN

## 2014-11-17 NOTE — ED Notes (Signed)
Patient c/o bilateral knee pain x 2 days; states right hurts worse than. Patient states she has been told she has arthritis in her legs.

## 2014-11-17 NOTE — ED Notes (Signed)
Called RT for breathing

## 2014-11-17 NOTE — ED Notes (Signed)
Called Bed control and changed bed type to obs/med-surg per Dr. Sherrie MustacheFisher

## 2014-11-17 NOTE — ED Notes (Signed)
Pt continues to experience pain in knees bilateral and have expiratory wheezes. Breathing treatment ordered per protocol.

## 2014-11-17 NOTE — H&P (Signed)
[facility] History and Physical  Jodi [name] ZOX:[id number] DOB: 1976/02/22 DOA: 11/17/2014  Referring physician: ED physician, Dr. Deretha Emory PCP: Syliva Overman, MD  Pulmonologist, Shaune Pollack, M.D.  Chief Complaint: Shortness of breath, cough, bilateral knee pain.  HPI: Jodi [name] is a 39 y.o. female with a history of cough variant asthma, allergic rhinitis, gastroesophageal reflux disease, who presents to the emergency department with a chief complaint of shortness of breath, cough, and bilateral knee pain. The patient had been recently started on treatment for acute asthma exacerbation by both her primary care physician and her pulmonologist, Dr. Juanetta Gosling. She had recently completed a Z-Pak and is currently on a prednisone taper which was started last week. Last week, she had postnasal drainage, a cough with minimal sputum, and some facial pressure. Most of the symptoms have subsided with exception of cough and dyspnea on exertion. She says that she has been using her inhalers as prescribed. She had subjective fever and chills last week but none over the past couple of days. She denies pleurisy, pain in her calves, lower extremity edema, abdominal pain, nausea, or vomiting. She has had some intermittent pain in her knees which she has had for several months. She was told that she had early arthritis. She has had some puffiness in both knees and warmth, but no acute hot red joints.  In the emergency department, she is afebrile and tachycardic with a heart rate from 105-122. Her blood pressures within normal limits. She is oxygenating 99% on supplemental oxygen. Her chest x-ray reveals no active cardiopulmonary disease. Her W BC is 14.8, d-dimer 0.82, and glucose is 137. Her ABG reveals a pH of 7.4, PCO2 of 43, and PO2 of 87.7. She is being admitted for further evaluation and management.    Review of Systems:  As above in history present illness; otherwise negative;  specifically she has had no recent travel or edema in her lower extremities.   Past Medical History  Diagnosis Date  . Asthma     Cough variant  . DVT (deep venous thrombosis) 10 yrs ago  . Asthma   . Allergic rhinitis   . Hyperlipidemia   . Shortness of breath     SOB even without exertion at times  . GERD (gastroesophageal reflux disease)   . IBS (irritable bowel syndrome)   . 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 Anemia   . Eczema   . Abdominal pain, chronic, right upper quadrant   . Chronic pain syndrome 06/08/2013  . Dyslipidemia 08/11/2012  . Acute recurrent maxillary sinusitis 11/04/2014   Past Surgical History  Procedure Laterality Date  . Blood clot removed from lower abdomen  2000  . Cyst removed from ovary and fluid removed from tubes  2000  . Partial hysterectomy  2009  . Esophagogastroduodenoscopy  10/22/2012    Procedure: ESOPHAGOGASTRODUODENOSCOPY (EGD);  Surgeon: Malissa Hippo, MD;  Location: [facility];  Service: Endoscopy;  Laterality: N/A;  100  . Colonoscopy  11/04/2012    Procedure: COLONOSCOPY;  Surgeon: Malissa Hippo, MD;  Location: [facility];  Service: Endoscopy;  Laterality: N/A;  915  . Abdominal hysterectomy  4-5 yrs ago  . Tubal ligation  1999  . Cholecystectomy  11/12/2012    Procedure: LAPAROSCOPIC CHOLECYSTECTOMY;  Surgeon: Dalia Heading, MD;  Location: [facility];  Service: General;  Laterality: N/A;   Social History: The patient is married. She has 3 children. She works at  [employer]. She stopped smoking 6 years ago. She denies illicit drug use and alcohol use.    Allergies  Allergen Reactions  . Compazine Other (See Comments)    Face swells, tongue sticks out    Family History  Problem Relation Age of Onset  . Asthma Mother   . Hypertension Mother   . Bronchiolitis Mother   . Hypertension Father   . Hyperlipidemia Father     recurrent rectum polyps   . Diabetes Sister   . Lupus Sister   . Depression Sister      Prior to Admission medications   Medication Sig Start Date End Date Taking? Authorizing Provider  albuterol (PROVENTIL HFA;VENTOLIN HFA) 108 (90 BASE) MCG/ACT inhaler Inhale 2 puffs into the lungs every 4 (four) hours as needed for wheezing or shortness of breath. 01/05/14  Yes Kerri Perches, MD  CALCIUM PO Take 1 tablet by mouth daily.   Yes Historical Provider, MD  desipramine (NORPRAMIN) 50 MG tablet Take 50 mg by mouth at bedtime.   Yes Historical Provider, MD  fluticasone (FLONASE) 50 MCG/ACT nasal spray USE TWO SPRAY(S) IN EACH NOSTRIL ONCE DAILY 06/20/14  Yes Kerri Perches, MD  ipratropium-albuterol (DUONEB) 0.5-2.5 (3) MG/3ML SOLN Take 3 mLs by nebulization every 6 (six) hours as needed (shortness of breath).    Yes Historical Provider, MD  levocetirizine (XYZAL) 5 MG tablet TAKE ONE TABLET BY MOUTH ONCE DAILY IN THE EVENING 07/25/14  Yes Kerri Perches, MD  montelukast (SINGULAIR) 10 MG tablet TAKE ONE TABLET BY MOUTH ONCE DAILY IN THE MORNING 09/29/14  Yes Kerri Perches, MD  Multiple Vitamins-Minerals (ONE DAILY FOR WOMEN) TABS Take 1 tablet by mouth every morning.   Yes Historical Provider, MD  omeprazole (PRILOSEC) 20 MG capsule Take 1 capsule (20 mg total) by mouth daily. 01/05/14  Yes Len Blalock, NP  predniSONE (DELTASONE) 10 MG tablet Take 10-40 mg by mouth See admin instructions. Starting on 11/06/2014 Take  for 3 days, then  for 3 days, then , etc until complete   Yes Historical Provider, MD  promethazine-dextromethorphan (PROMETHAZINE-DM) 6.25-15 MG/5ML syrup Take 5 mLs by mouth at bedtime as needed for cough. 10/31/14  Yes Kerri Perches, MD  ranitidine (ZANTAC) 150 MG tablet TAKE ONE TABLET BY MOUTH TWICE DAILY 08/28/14  Yes Kerri Perches, MD  Vitamin D, Ergocalciferol, (DRISDOL) 50000 UNITS CAPS capsule Take 1 capsule (50,000 Units total) by mouth every 7 (seven) days. Patient taking differently: Take 50,000 Units by mouth every 7  (seven) days. Take on Thursday. 11/01/14  Yes Kerri Perches, MD  ADVAIR [facility] [phone] MCG/ACT inhaler INHALE TWO PUFFS BY MOUTH TWICE DAILY Patient not taking: Reported on 11/17/2014 07/25/14   Kerri Perches, MD  azithromycin (ZITHROMAX) 250 MG tablet Take 2 po the first day then once a day for the next 4 days. Patient not taking: Reported on 11/17/2014 11/07/14   Ward [name], MD  predniSONE (STERAPRED UNI-PAK) 5 MG TABS tablet Take 1 tablet (5 mg total) by mouth as directed. Patient not taking: Reported on 11/17/2014 10/31/14   Kerri Perches, MD   Physical Exam: Filed Vitals:   11/17/14 6045 11/17/14 1121 11/17/14 1210 11/17/14 1324  BP:  134/86  120/73  Pulse: 117 121  122  Temp:      TempSrc:      Resp:  Height:      Weight:      SpO2:  100% 100% 100% 99%    Wt Readings from Last 3 Encounters:  11/17/14 72.576 kg (160 lb)  11/06/14 72.122 kg (159 lb)  10/31/14 72.176 kg (159 lb 1.9 oz)    General:  Appears calm and comfortable; 39 year old African-American woman in no acute distress. Eyes: PERRL, normal lids, irises & conjunctiva; conjunctivae are clear and sclerae are white. ENT: grossly normal hearing, lips & tongue; oropharynx because membranes mildly dry. Neck: no LAD, masses or thyromegaly Cardiovascular: S1, S2, with tachycardia. No LE edema. Telemetry: Not applicable.  Respiratory: Breathing at rest nonlabored, but she is dyspneic when she speaks. No accessory respiratory muscle use. Global decrease in breath sounds, but otherwise clear without any audible wheezes or crackles. Abdomen: soft, ntnd, positive bowel sounds, no hepatosplenomegaly. Skin: no rash or induration seen on limited exam Musculoskeletal: Both knees without edema or effusion; no erythema; mild tenderness of both knee joints; no significant crepitus; no acute hot red joints. No calf tenderness or cords palpated. Psychiatric: grossly normal mood and affect, speech fluent and  appropriate Neurologic: grossly non-focal; cranial nerves II through XII are intact.           Labs on Admission:  Basic Metabolic Panel:  Recent Labs Lab 11/17/14 1128  NA 138  K 4.2  CL 104  CO2 28  GLUCOSE 137*  BUN 17  CREATININE 0.97  CALCIUM 9.0   Liver Function Tests: No results for input(s): AST, ALT, ALKPHOS, BILITOT, PROT, ALBUMIN in the last 168 hours. No results for input(s): LIPASE, AMYLASE in the last 168 hours. No results for input(s): AMMONIA in the last 168 hours. CBC:  Recent Labs Lab 11/17/14 1128  WBC 14.8*  NEUTROABS 13.0*  HGB 12.2  HCT 35.8*  MCV 91.3  PLT 368   Cardiac Enzymes: No results for input(s): CKTOTAL, CKMB, CKMBINDEX, TROPONINI in the last 168 hours.  BNP (last 3 results) No results for input(s): BNP in the last 8760 hours.  ProBNP (last 3 results) No results for input(s): PROBNP in the last 8760 hours.  CBG: No results for input(s): GLUCAP in the last 168 hours.  Radiological Exams on Admission: Dg Chest 2 View  11/17/2014   CLINICAL DATA:  Slight nonproductive cough, shortness of breath on and off for 3 weeks  EXAM: CHEST  2 VIEW  COMPARISON:  11/06/2014  FINDINGS: The heart size and mediastinal contours are within normal limits. Both lungs are clear. The visualized skeletal structures are unremarkable.  IMPRESSION: No active cardiopulmonary disease.   Electronically Signed   By: Elige Ko   On: 11/17/2014 11:56    EKG: Independently reviewed. Pending  Assessment/Plan Principal Problem:   Acute asthma flare Active Problems:   Cough variant asthma   Bilateral knee pain   GERD (gastroesophageal reflux disease)   Hyperglycemia, drug-induced   1. Acute asthma flare in a patient with chronic cough variant asthma. The patient had been started on treatment for acute asthma exacerbation coupled with allergic rhinitis and GERD with Z-Pak and a prednisone taper. She also is chronically on duo nebulizer and Singulair as well  as Advair. Her symptoms started getting worse as her prednisone was tapered down yesterday. She has no significant wheezes on exam as she does have cough variant asthma. She is oxygenating well. Her ABG is reassuring. The patient will be admitted for 24-hour observation. She will be started on IV Solu-Medrol, Levaquin empirically, Xopenex nebulizer; and restarted on Advair and Singulair. We'll oxygen as needed for calm for. We'll  add Tessalon Perles for cough. 2. Elevated d-dimer. Will order a CT angiogram for further evaluation. Of note, the patient reports that Dr. Juanetta GoslingHawkins was planning on ordering a CT of her chest to rule out other diagnoses that could be causing her worsening asthma. 3. Bilateral knee pain. The patient was told that she may have early onset arthritis. Her knee pain is generally worse in the morning and decreases with activity and later in the day. There is no significant effusion or warmth per exam. Will treat her with analgesics as needed. Will add topical Voltaren 3 times daily. 4. Hyperglycemia. Likely steroid-induced. Will order a hemoglobin A1c and cover her elevated blood glucose with sliding-scale NovoLog. 5. GERD. We'll continue PPI and H2 blocker. 6. Leukocytosis, likely secondary to steroids. Levaquin started empirically.    Code Status: Full code DVT Prophylaxis: Lovenox Family Communication: Not available Disposition Plan: Anticipate discharge to home in the next 24-48 hours.  Time spent: One hour  [facility] At OaklandFISHER,Eshaan Titzer [facility] Pager [phone]

## 2014-11-17 NOTE — ED Notes (Signed)
MD at the bedside  

## 2014-11-17 NOTE — ED Provider Notes (Signed)
CSN: [id number]     Arrival date & time 11/17/14  0230 History  This chart was scribed for Vanetta MuldersScott Tarris Delbene, MD by Luisa DagoPriscilla Tutu, ED Scribe. This patient was seen in room [facility] and the patient's care was started at 7:32 AM.    Chief Complaint  Patient presents with  . Knee Pain   The history is provided by the patient and medical records. No language interpreter was used.   HPI Comments: Jodi [name] is a 39 y.o. female with PM h/o arthritis and asthma presents to the Emergency Department complaining of sudden onset gradually worsening bilateral knee pain that started 3 days ago after waking. She states that after waking up she could not walk or bear weight on her knees. Reports taking Tylenol without any adequate relief. Pt states that she normally doesn't take any medication for her arthritis. She currently rates her pain as a "7/10". Denies any recent injuries or falls. she is also experiencing an exacerbation of her asthma. She was given prednisone taper.   Past Medical History  Diagnosis Date  . Asthma   . DVT (deep venous thrombosis) 10 yrs ago  . Asthma   . Allergic rhinitis   . Hyperlipidemia   . Shortness of breath     SOB even without exertion at times  . GERD (gastroesophageal reflux disease)   . IBS (irritable bowel syndrome)   . 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 Anemia   . Eczema   . Abdominal pain, chronic, right upper quadrant    Past Surgical History  Procedure Laterality Date  . Blood clot removed from lower abdomen  2000  . Cyst removed from ovary and fluid removed from tubes  2000  . Partial hysterectomy  2009  . Esophagogastroduodenoscopy  10/22/2012    Procedure: ESOPHAGOGASTRODUODENOSCOPY (EGD);  Surgeon: [name], MD;  Location: [facility];  Service: Endoscopy;  Laterality: N/A;  100  . Colonoscopy  11/04/2012    Procedure: COLONOSCOPY;  Surgeon: [name], MD;  Location: [facility];  Service: Endoscopy;  Laterality:  N/A;  915  . Abdominal hysterectomy  4-5 yrs ago  . Tubal ligation  1999  . Cholecystectomy  11/12/2012    Procedure: LAPAROSCOPIC CHOLECYSTECTOMY;  Surgeon: Dalia HeadingMark A Jenkins, MD;  Location: [facility];  Service: General;  Laterality: N/A;   Family History  Problem Relation Age of Onset  . Asthma Mother   . Hypertension Mother   . Bronchiolitis Mother   . Hypertension Father   . Hyperlipidemia Father     recurrent rectum polyps   . Diabetes Sister   . Lupus Sister   . Depression Sister    History  Substance Use Topics  . Smoking status: Former Smoker -- 0.25 packs/day for 10 years    Types: Cigarettes    Quit date: 10/13/2008  . Smokeless tobacco: Never Used  . Alcohol Use: No   OB History    No data available     Review of Systems  Constitutional: Negative for fever and chills.  HENT: Negative for rhinorrhea, sinus pressure and sore throat.   Eyes: Negative for visual disturbance.  Respiratory: Positive for cough, chest tightness, shortness of breath and wheezing.   Cardiovascular: Negative for chest pain and leg swelling.  Gastrointestinal: Negative for nausea, vomiting, abdominal pain and diarrhea.  Genitourinary: Negative for dysuria.  Musculoskeletal: Positive for arthralgias. Negative for joint swelling.  Skin: Negative for rash.  Neurological: Negative  for headaches.  Psychiatric/Behavioral: Negative for confusion.      Allergies  Compazine  Home Medications   Prior to Admission medications   Medication Sig Start Date End Date Taking? Authorizing Provider  albuterol (PROVENTIL HFA;VENTOLIN HFA) 108 (90 BASE) MCG/ACT inhaler Inhale 2 puffs into the lungs every 4 (four) hours as needed for wheezing or shortness of breath. 01/05/14  Yes Kerri Perches, MD  CALCIUM PO Take 1 tablet by mouth daily.   Yes Historical Provider, MD  desipramine (NORPRAMIN) 50 MG tablet Take 50 mg by mouth at bedtime.   Yes Historical Provider, MD  fluticasone (FLONASE) 50 MCG/ACT  nasal spray USE TWO SPRAY(S) IN EACH NOSTRIL ONCE DAILY 06/20/14  Yes Kerri Perches, MD  ipratropium-albuterol (DUONEB) 0.5-2.5 (3) MG/3ML SOLN Take 3 mLs by nebulization every 6 (six) hours as needed (shortness of breath).    Yes Historical Provider, MD  levocetirizine (XYZAL) 5 MG tablet TAKE ONE TABLET BY MOUTH ONCE DAILY IN THE EVENING 07/25/14  Yes Kerri Perches, MD  montelukast (SINGULAIR) 10 MG tablet TAKE ONE TABLET BY MOUTH ONCE DAILY IN THE MORNING 09/29/14  Yes Kerri Perches, MD  Multiple Vitamins-Minerals (ONE DAILY FOR WOMEN) TABS Take 1 tablet by mouth every morning.   Yes Historical Provider, MD  omeprazole (PRILOSEC) 20 MG capsule Take 1 capsule (20 mg total) by mouth daily. 01/05/14  Yes Len Blalock, NP  predniSONE (DELTASONE) 10 MG tablet Take 10-40 mg by mouth See admin instructions. Starting on 11/06/2014 Take 40mg  for 3 days, then 30mg  for 3 days, then 20mg , etc until complete   Yes Historical Provider, MD  promethazine-dextromethorphan (PROMETHAZINE-DM) 6.25-15 MG/5ML syrup Take 5 mLs by mouth at bedtime as needed for cough. 10/31/14  Yes Kerri Perches, MD  ranitidine (ZANTAC) 150 MG tablet TAKE ONE TABLET BY MOUTH TWICE DAILY 08/28/14  Yes Kerri Perches, MD  Vitamin D, Ergocalciferol, (DRISDOL) 50000 UNITS CAPS capsule Take 1 capsule (50,000 Units total) by mouth every 7 (seven) days. Patient taking differently: Take 50,000 Units by mouth every 7 (seven) days. Take on Thursday. 11/01/14  Yes Kerri Perches, MD  ADVAIR [facility] [phone] MCG/ACT inhaler INHALE TWO PUFFS BY MOUTH TWICE DAILY Patient not taking: Reported on 11/17/2014 07/25/14   Kerri Perches, MD  azithromycin (ZITHROMAX) 250 MG tablet Take 2 po the first day then once a day for the next 4 days. Patient not taking: Reported on 11/17/2014 11/07/14   Ward [name], MD  predniSONE (STERAPRED UNI-PAK) 5 MG TABS tablet Take 1 tablet (5 mg total) by mouth as directed. Patient not taking: Reported on  11/17/2014 10/31/14   Kerri Perches, MD   BP 140/123 mmHg  Pulse 114  Temp(Src) 98.4 F (36.9 C) (Oral)  Resp 20  Ht 5\' 6"  (1.676 m)  Wt 160 lb (72.576 kg)  BMI 25.84 kg/m2  SpO2 97%  LMP 05/17/2012  Physical Exam  Constitutional: She is oriented to person, place, and time. She appears well-developed and well-nourished. No distress.  HENT:  Head: Normocephalic and atraumatic.  Eyes: Conjunctivae and EOM are normal. Pupils are equal, round, and reactive to light.  Neck: Neck supple.  Cardiovascular: Normal rate, regular rhythm and normal heart sounds.  Exam reveals no gallop and no friction rub.   No murmur heard. Pulmonary/Chest: Effort normal. No respiratory distress. She has no wheezes.  Decreased air movement bilaterally.  Abdominal: Soft. Bowel sounds are normal. She exhibits no distension. There is no  tenderness.  Musculoskeletal: Normal range of motion.  No swelling of bilateral ankles. No swelling of bilateral knees. Bilateral knees warm to the touch.  Neurological: She is alert and oriented to person, place, and time.  Skin: Skin is warm and dry.  Psychiatric: She has a normal mood and affect. Her behavior is normal.  Nursing note and vitals reviewed.   ED Course  Procedures (including critical care time)  DIAGNOSTIC STUDIES: Oxygen Saturation is 97% on RA, normal by my interpretation.    COORDINATION OF CARE: 7:41 AM- Pt advised of plan for treatment and pt agrees.  Labs Review Labs Reviewed  CBC WITH DIFFERENTIAL/PLATELET - Abnormal; Notable for the following:    WBC 14.8 (*)    HCT 35.8 (*)    Neutrophils Relative % 88 (*)    Neutro Abs 13.0 (*)    Lymphocytes Relative 9 (*)    All other components within normal limits  BASIC METABOLIC PANEL - Abnormal; Notable for the following:    Glucose, Bld 137 (*)    GFR calc non Af Amer 72 (*)    GFR calc Af Amer 84 (*)    All other components within normal limits  BLOOD GAS, ARTERIAL   Results for orders  placed or performed during the hospital encounter of 11/17/14  CBC with Differential/Platelet  Result Value Ref Range   WBC 14.8 (H) 4.0 - 10.5 K/uL   RBC 3.92 3.87 - 5.11 MIL/uL   Hemoglobin 12.2 12.0 - 15.0 g/dL   HCT 57.8 (L) 46.9 - 62.9 %   MCV 91.3 78.0 - 100.0 fL   MCH 31.1 26.0 - 34.0 pg   MCHC 34.1 30.0 - 36.0 g/dL   RDW 52.8 41.3 - 24.4 %   Platelets 368 150 - 400 K/uL   Neutrophils Relative % 88 (H) 43 - 77 %   Neutro Abs 13.0 (H) 1.7 - 7.7 K/uL   Lymphocytes Relative 9 (L) 12 - 46 %   Lymphs Abs 1.3 0.7 - 4.0 K/uL   Monocytes Relative 3 3 - 12 %   Monocytes Absolute 0.5 0.1 - 1.0 K/uL   Eosinophils Relative 0 0 - 5 %   Eosinophils Absolute 0.0 0.0 - 0.7 K/uL   Basophils Relative 0 0 - 1 %   Basophils Absolute 0.0 0.0 - 0.1 K/uL  Basic metabolic panel  Result Value Ref Range   Sodium 138 135 - 145 mmol/L   Potassium 4.2 3.5 - 5.1 mmol/L   Chloride 104 96 - 112 mmol/L   CO2 28 19 - 32 mmol/L   Glucose, Bld 137 (H) 70 - 99 mg/dL   BUN 17 6 - 23 mg/dL   Creatinine, Ser 0.10 0.50 - 1.10 mg/dL   Calcium 9.0 8.4 - 27.2 mg/dL   GFR calc non Af Amer 72 (L) >90 mL/min   GFR calc Af Amer 84 (L) >90 mL/min   Anion gap 6 5 - 15     Imaging Review Dg Chest 2 View  11/17/2014   CLINICAL DATA:  Slight nonproductive cough, shortness of breath on and off for 3 weeks  EXAM: CHEST  2 VIEW  COMPARISON:  11/06/2014  FINDINGS: The heart size and mediastinal contours are within normal limits. Both lungs are clear. The visualized skeletal structures are unremarkable.  IMPRESSION: No active cardiopulmonary disease.   Electronically Signed   By: Elige Ko   On: 11/17/2014 11:56     EKG Interpretation None  MDM   Final diagnoses:  Asthma  Arthritis    Patient with known history of asthma however presented to the emergency department for bilateral knee pain that's consistent with her arthritis pain. Patient seen January 25 in the emergency department for exacerbation of  asthma. Patient started on a steroid taper at that time. Now down to 10 mg of prednisone a day. But breathing is been getting worse over the last several days. Here in the emergency department patient had trouble getting full sentences out. However pulse ox is been 100%. No wheezing heard but decreased air movement bilaterally chest x-ray negative for pneumonia or pneumothorax.  Patient treated with nebulizer treatments 2 given oral prednisone early on 60 mg. No significant improvement. Patient states a little bit worse than usual but she's been struggling with this now for a week or so. No hypoxia. Some tachycardia has persisted.  Discussed with hospitalist they will admit. Patient's knee pain treated with hydrocodone. Some improvement. Examination the knees without any swelling no redness. Maybe some slight increased warmth.  I personally performed the services described in this documentation, which was scribed in my presence. The recorded information has been reviewed and is accurate.     Vanetta Mulders, MD 11/17/14 1304

## 2014-11-18 DIAGNOSIS — R739 Hyperglycemia, unspecified: Secondary | ICD-10-CM

## 2014-11-18 LAB — HEMOGLOBIN A1C
HEMOGLOBIN A1C: 5.5 % (ref 4.8–5.6)
MEAN PLASMA GLUCOSE: 111 mg/dL

## 2014-11-18 LAB — CBC
HEMATOCRIT: 36.5 % (ref 36.0–46.0)
Hemoglobin: 12.4 g/dL (ref 12.0–15.0)
MCH: 31.2 pg (ref 26.0–34.0)
MCHC: 34 g/dL (ref 30.0–36.0)
MCV: 91.7 fL (ref 78.0–100.0)
Platelets: 371 10*3/uL (ref 150–400)
RBC: 3.98 MIL/uL (ref 3.87–5.11)
RDW: 13.8 % (ref 11.5–15.5)
WBC: 24.8 10*3/uL — ABNORMAL HIGH (ref 4.0–10.5)

## 2014-11-18 LAB — BASIC METABOLIC PANEL
ANION GAP: 8 (ref 5–15)
BUN: 15 mg/dL (ref 6–23)
CALCIUM: 9.3 mg/dL (ref 8.4–10.5)
CHLORIDE: 104 mmol/L (ref 96–112)
CO2: 26 mmol/L (ref 19–32)
Creatinine, Ser: 0.85 mg/dL (ref 0.50–1.10)
GFR, EST NON AFRICAN AMERICAN: 85 mL/min — AB (ref >90)
GLUCOSE: 147 mg/dL — AB (ref 70–99)
Potassium: 4 mmol/L (ref 3.5–5.1)
Sodium: 138 mmol/L (ref 135–145)

## 2014-11-18 LAB — GLUCOSE, CAPILLARY: GLUCOSE-CAPILLARY: 121 mg/dL — AB (ref 70–99)

## 2014-11-18 MED ORDER — PREDNISONE 10 MG PO TABS: ORAL_TABLET | ORAL | Status: DC

## 2014-11-18 MED ORDER — FLUTICASONE FUROATE-VILANTEROL 100-25 MCG/INH IN AEPB
1.0000 | INHALATION_SPRAY | Freq: Every day | RESPIRATORY_TRACT | Status: DC
Start: 2014-11-18 — End: 2017-09-24

## 2014-11-18 MED ORDER — BENZONATATE 100 MG PO CAPS: 100.0000 mg | ORAL_CAPSULE | Freq: Three times a day (TID) | ORAL | Status: DC

## 2014-11-18 MED ORDER — LEVOFLOXACIN 500 MG PO TABS: 500.0000 mg | ORAL_TABLET | Freq: Every day | ORAL | Status: DC

## 2014-11-18 MED ORDER — DICLOFENAC SODIUM 1 % TD GEL: 4.0000 g | Freq: Three times a day (TID) | TRANSDERMAL | Status: DC | PRN

## 2014-11-18 NOTE — Discharge Summary (Signed)
Physician Discharge Summary  Jodi [name] ZOX:[id number] DOB: [birth year] DOA: 11/17/2014  PCP: Syliva Overman, MD  Admit date: 11/17/2014 Discharge date: 11/18/2014  Time spent: 30  minutes  Recommendations for Outpatient Follow-up:  1.    Discharge Diagnoses:  1. Acute asthma exacerbation. 2. Cough variant asthma. 3. Mildly elevated d-dimer. CT angiogram was negative for PE. 4. Bilateral knee pain. 5. Steroid-induced hyperglycemia. The patient's hemoglobin A1c was 5.5. 6. Leukocytosis, steroid-induced. 7. Chronic allergic rhinitis. 8. GERD. 9. Sinus tachycardia, likely secondary to bronchodilators. Outpatient TSH was within normal limits.   Discharge Condition: Improved.  Diet recommendation: Regular as tolerated.  Filed Weights   11/17/14 0242  Weight: 72.576 kg (160 lb)    History of present illness:  The patient is a 39 year old woman with a history of cough. Asthma, allergic rhinitis, and gastroesophageal reflux disease, who presented to the emergency department with a chief complaint of shortness of breath, cough, and bilateral knee pain. In the emergency department, she was afebrile and tachycardic with a heart rate from 105-122 bpm. Her blood pressures were within normal limits. She was oxygenating 99% on supplemental oxygen. Her chest x-ray revealed no active cardiopulmonary disease. Her white blood cell count was 14.8, d-dimer 0.82, and glucose was 137. Her ABG revealed a pH of 7.4, PCO2 of 43, and PO2 of 87.7. She was admitted for further evaluation and management.  Hospital Course:  The patient was started on all of her chronic medications. Xopenex nebulizer was given instead of albuterol because of her tachycardia. She was started on IV Levaquin, IV Solu-Medrol, oxygen as needed, and Tessalon Perles was added as needed for cough. For her knee pain, as needed opiate analgesics were ordered as well as topical Voltaren. These measures did help. She was noted to have  mild leukocytosis and hyperglycemia, but this was thought to be secondary to previous steroid treatment prior to the hospital admission. Her hemoglobin A1c was found to be 5.5. With the IV steroids, her white blood cell count did increase, but there was no active sign of infection and she had no fever. The cause of the elevated d-dimer, CT angiogram was ordered. It revealed no evidence of PE.  The patient improved clinically and symptomatically. She was advised to follow-up with Dr. Juanetta Gosling in a couple of weeks. Otherwise, she was discharged home in improved condition on an ongoing steroid taper, Levaquin for a few more days, and her chronic bronchodilators.  Procedures:  None  Consultations:  None  Discharge Exam: Filed Vitals:   11/18/14 0637  BP: 136/87  Pulse: 115  Temp: 98.4 F (36.9 C)  Resp: 18   oxygen saturation 98% on room air  General: Pleasant 39 year old F-American woman sitting up in bed, in no acute distress. Cardiovascular: S1, S2, with mild tachycardia. Respiratory: Breathing nonlabored; resolved dyspnea; lungs clear to auscultation bilaterally.  Discharge Instructions   Discharge Instructions    Diet general    Complete by:  As directed      Increase activity slowly    Complete by:  As directed           Current Discharge Medication List    START taking these medications   Details  benzonatate (TESSALON) 100 MG capsule Take 1 capsule (100 mg total) by mouth 3 (three) times daily. Qty: 20 capsule, Refills: 0    diclofenac sodium (VOLTAREN) 1 % GEL Apply 4 g topically 3 (three) times daily as needed (For knee pain). Qty: 1 Tube, Refills: 1  Fluticasone Furoate-Vilanterol 100-25 MCG/INH AEPB Inhale 1 puff into the lungs daily.    levofloxacin (LEVAQUIN) 500 MG tablet Take 1 tablet (500 mg total) by mouth daily. Antibiotic. Qty: 5 tablet, Refills: 0      CONTINUE these medications which have CHANGED   Details  predniSONE (DELTASONE) 10 MG tablet  Starting tomorrow, take 6 tablets daily for 1 day; then 5 tablets the next day for 1 day; then 4 tablets the next day for 1 day; then 3 tablets the next day for 1 day; then 2 tablets the next day for 1 day; then 1 tablet the next day, then stop. Qty: 21 tablet, Refills: 0      CONTINUE these medications which have NOT CHANGED   Details  albuterol (PROVENTIL HFA;VENTOLIN HFA) 108 (90 BASE) MCG/ACT inhaler Inhale 2 puffs into the lungs every 4 (four) hours as needed for wheezing or shortness of breath. Qty: 6.7 g, Refills: 3    CALCIUM PO Take 1 tablet by mouth daily.    desipramine (NORPRAMIN) 50 MG tablet Take 50 mg by mouth at bedtime.    fluticasone (FLONASE) 50 MCG/ACT nasal spray USE TWO SPRAY(S) IN EACH NOSTRIL ONCE DAILY Qty: 16 g, Refills: 0    ipratropium-albuterol (DUONEB) 0.5-2.5 (3) MG/3ML SOLN Take 3 mLs by nebulization every 6 (six) hours as needed (shortness of breath).     levocetirizine (XYZAL) 5 MG tablet TAKE ONE TABLET BY MOUTH ONCE DAILY IN THE EVENING Qty: 30 tablet, Refills: 3    montelukast (SINGULAIR) 10 MG tablet TAKE ONE TABLET BY MOUTH ONCE DAILY IN THE MORNING Qty: 30 tablet, Refills: 2    Multiple Vitamins-Minerals (ONE DAILY FOR WOMEN) TABS Take 1 tablet by mouth every morning.    omeprazole (PRILOSEC) 20 MG capsule Take 1 capsule (20 mg total) by mouth daily. Qty: 30 capsule, Refills: 5   Associated Diagnoses: GERD (gastroesophageal reflux disease)    promethazine-dextromethorphan (PROMETHAZINE-DM) 6.25-15 MG/5ML syrup Take 5 mLs by mouth at bedtime as needed for cough. Qty: 118 mL, Refills: 0    ranitidine (ZANTAC) 150 MG tablet TAKE ONE TABLET BY MOUTH TWICE DAILY Qty: 60 tablet, Refills: 3    Vitamin D, Ergocalciferol, (DRISDOL) 50000 UNITS CAPS capsule Take 1 capsule (50,000 Units total) by mouth every 7 (seven) days. Qty: 4 capsule, Refills: 5      STOP taking these medications     ADVAIR HFA 230-21 MCG/ACT inhaler      azithromycin  (ZITHROMAX) 250 MG tablet      predniSONE (STERAPRED UNI-PAK) 5 MG TABS tablet        Allergies  Allergen Reactions  . Compazine Other (See Comments)    Face swells, tongue sticks out   Follow-up Information    Follow up with Syliva Overman, MD.   Specialty:  Family Medicine   Why:  Follow-up as scheduled.   Contact information:   [address] [place] [address] [phone]       Follow up with HAWKINS,EDWARD L, MD. Schedule an appointment as soon as possible for a visit in 2 weeks.   Specialty:  Pulmonary Disease   Contact information:   [address] [place] [address] [phone]        The results of significant diagnostics from this hospitalization (including imaging, microbiology, ancillary and laboratory) are listed below for reference.    Significant Diagnostic Studies: Dg Sinuses Complete  10/31/2014   CLINICAL DATA:  Maxillary sinus pressure.  Cough.  EXAM: PARANASAL SINUSES - COMPLETE 3 + VIEW  COMPARISON:  None.  FINDINGS: The maxillary and ethmoid sinuses are aerated and show no evidence of air-fluid levels or significant mucosal thickening. Frontal sinuses appear nondeveloped.  IMPRESSION: No radiographic evidence of sinusitis.   Electronically Signed   By: Myles Rosenthal M.D.   On: 10/31/2014 18:27   Dg Chest 2 View  11/17/2014   CLINICAL DATA:  Slight nonproductive cough, shortness of breath on and off for 3 weeks  EXAM: CHEST  2 VIEW  COMPARISON:  11/06/2014  FINDINGS: The heart size and mediastinal contours are within normal limits. Both lungs are clear. The visualized skeletal structures are unremarkable.  IMPRESSION: No active cardiopulmonary disease.   Electronically Signed   By: Elige Ko   On: 11/17/2014 11:56   Dg Chest 2 View  10/31/2014   CLINICAL DATA:  Cough and chills.  EXAM: CHEST - 2 VIEW  COMPARISON:  01/09/2014  FINDINGS: The heart size and mediastinal contours are within normal limits. There is no  evidence of pulmonary edema, consolidation, pneumothorax, nodule or pleural fluid. The visualized skeletal structures are unremarkable.  IMPRESSION: No active disease.   Electronically Signed   By: Irish Lack M.D.   On: 10/31/2014 18:25   Ct Angio Chest Pe W/cm &/or Wo Cm  11/17/2014   CLINICAL DATA:  Shortness of breath, cough, hx of asthma and DVT  EXAM: CT ANGIOGRAPHY CHEST WITH CONTRAST  TECHNIQUE: Multidetector CT imaging of the chest was performed using the standard protocol during bolus administration of intravenous contrast. Multiplanar CT image reconstructions and MIPs were obtained to evaluate the vascular anatomy.  CONTRAST:  OMNIPAQUE IOHEXOL 350 MG/ML SOLN  COMPARISON:  03/24/2012  FINDINGS: SVC patent. Right ventricle nondilated. Satisfactory opacification of pulmonary arteries noted, and there is no evidence of pulmonary emboli. Superior and inferior pulmonary veins patent bilaterally. Adequate contrast opacification of the thoracic aorta with no evidence of dissection, aneurysm, or stenosis. There is classic 3-vessel brachiocephalic arch anatomy without proximal stenosis.  No pleural or pericardial effusion. No hilar or mediastinal adenopathy. Partially calcified granuloma in the right middle lobe. Lungs otherwise clear. Thoracic spine and sternum intact. Stable sub centimeter hepatic cyst. Remainder visualized upper abdomen unremarkable.  Review of the MIP images confirms the above findings.  IMPRESSION: 1. Negative.  No pulmonary embolus or aortic dissection.   Electronically Signed   By: Oley Balm M.D.   On: 11/17/2014 16:22   Dg Chest Port 1 View  11/06/2014   CLINICAL DATA:  Asthma flare beginning 20 min prior to arrival in the emergency department.  EXAM: PORTABLE CHEST - 1 VIEW  COMPARISON:  Chest radiograph October 31, 2014  FINDINGS: Cardiomediastinal silhouette is unremarkable. The lungs are clear without pleural effusions or focal consolidations. Trachea projects  midline and there is no pneumothorax. Soft tissue planes and included osseous structures are non-suspicious.  IMPRESSION: No acute cardiopulmonary process ; stable chest radiograph from October 31, 2014.   Electronically Signed   By: Awilda Metro   On: 11/06/2014 22:58    Microbiology: No results found for this or any previous visit (from the past 240 hour(s)).   Labs: Basic Metabolic Panel:  Recent Labs Lab 11/17/14 1128 11/18/14 0551  NA 138 138  K 4.2 4.0  CL 104 104  CO2 28 26  GLUCOSE 137* 147*  BUN 17 15  CREATININE 0.97 0.85  CALCIUM 9.0 9.3   Liver Function Tests: No results for  input(s): AST, ALT, ALKPHOS, BILITOT, PROT, ALBUMIN in the last 168 hours. No results for input(s): LIPASE, AMYLASE in the last 168 hours. No results for input(s): AMMONIA in the last 168 hours. CBC:  Recent Labs Lab 11/17/14 1128 11/18/14 0551  WBC 14.8* 24.8*  NEUTROABS 13.0*  --   HGB 12.2 12.4  HCT 35.8* 36.5  MCV 91.3 91.7  PLT 368 371   Cardiac Enzymes: No results for input(s): CKTOTAL, CKMB, CKMBINDEX, TROPONINI in the last 168 hours. BNP: BNP (last 3 results) No results for input(s): BNP in the last 8760 hours.  ProBNP (last 3 results) No results for input(s): PROBNP in the last 8760 hours.  CBG:  Recent Labs Lab 11/17/14 1638 11/17/14 2117 11/18/14 0749  GLUCAP 104* 201* 121*       Signed:  Kaicee Scarpino  [facility] 11/18/2014, 10:32 AM

## 2014-11-18 NOTE — Progress Notes (Signed)
Reviewed discharge information with pt, answered questions.  Will continue to monitor pt until leaves floor.

## 2014-11-18 NOTE — Progress Notes (Signed)
Utilization Review completed.  

## 2014-12-14 ENCOUNTER — Ambulatory Visit (HOSPITAL_COMMUNITY)
Admission: RE | Admit: 2014-12-14 | Discharge: 2014-12-14 | Disposition: A | Discharge status: Home / Self Care | Payer: 59 | Source: Ambulatory Visit | Attending: Cardiology | Admitting: Cardiology

## 2014-12-14 DIAGNOSIS — I348 Other nonrheumatic mitral valve disorders: Secondary | ICD-10-CM

## 2014-12-14 DIAGNOSIS — I071 Rheumatic tricuspid insufficiency: Secondary | ICD-10-CM | POA: Diagnosis not present

## 2014-12-14 DIAGNOSIS — Z87891 Personal history of nicotine dependence: Secondary | ICD-10-CM | POA: Insufficient documentation

## 2014-12-14 DIAGNOSIS — E784 Other hyperlipidemia: Secondary | ICD-10-CM | POA: Diagnosis not present

## 2014-12-14 DIAGNOSIS — R0600 Dyspnea, unspecified: Secondary | ICD-10-CM | POA: Diagnosis not present

## 2014-12-14 NOTE — Progress Notes (Signed)
*  PRELIMINARY RESULTS* Echocardiogram 2D Echocardiogram has been performed.  Jeryl ColumbiaLLIOTT, Seleni Meller 12/14/2014, 10:36 AM

## 2014-12-28 ENCOUNTER — Other Ambulatory Visit: Payer: Self-pay | Admitting: Family Medicine

## 2015-01-23 ENCOUNTER — Other Ambulatory Visit (HOSPITAL_COMMUNITY): Payer: Self-pay | Admitting: Orthopaedic Surgery

## 2015-01-23 DIAGNOSIS — M25561 Pain in right knee: Secondary | ICD-10-CM

## 2015-01-29 ENCOUNTER — Other Ambulatory Visit (HOSPITAL_COMMUNITY): Payer: 59

## 2015-01-30 ENCOUNTER — Ambulatory Visit (HOSPITAL_COMMUNITY)
Admission: RE | Admit: 2015-01-30 | Discharge: 2015-01-30 | Disposition: A | Discharge status: Home / Self Care | Payer: 59 | Source: Ambulatory Visit | Attending: Orthopaedic Surgery | Admitting: Orthopaedic Surgery

## 2015-01-30 DIAGNOSIS — M25561 Pain in right knee: Secondary | ICD-10-CM | POA: Insufficient documentation

## 2015-02-04 ENCOUNTER — Other Ambulatory Visit: Payer: Self-pay | Admitting: Family Medicine

## 2015-03-06 ENCOUNTER — Encounter (HOSPITAL_COMMUNITY): Payer: Self-pay | Admitting: *Deleted

## 2015-03-06 ENCOUNTER — Emergency Department (HOSPITAL_COMMUNITY)
Admission: EM | Admit: 2015-03-06 | Discharge: 2015-03-06 | Disposition: A | Discharge status: Home / Self Care | Payer: 59 | Attending: Emergency Medicine | Admitting: Emergency Medicine

## 2015-03-06 ENCOUNTER — Emergency Department (HOSPITAL_COMMUNITY): Payer: 59

## 2015-03-06 DIAGNOSIS — Y998 Other external cause status: Secondary | ICD-10-CM | POA: Insufficient documentation

## 2015-03-06 DIAGNOSIS — Z7952 Long term (current) use of systemic steroids: Secondary | ICD-10-CM | POA: Insufficient documentation

## 2015-03-06 DIAGNOSIS — Z87891 Personal history of nicotine dependence: Secondary | ICD-10-CM | POA: Insufficient documentation

## 2015-03-06 DIAGNOSIS — J45909 Unspecified asthma, uncomplicated: Secondary | ICD-10-CM | POA: Insufficient documentation

## 2015-03-06 DIAGNOSIS — Z79899 Other long term (current) drug therapy: Secondary | ICD-10-CM | POA: Diagnosis not present

## 2015-03-06 DIAGNOSIS — X58XXXA Exposure to other specified factors, initial encounter: Secondary | ICD-10-CM | POA: Diagnosis not present

## 2015-03-06 DIAGNOSIS — S93401A Sprain of unspecified ligament of right ankle, initial encounter: Secondary | ICD-10-CM

## 2015-03-06 DIAGNOSIS — Z86718 Personal history of other venous thrombosis and embolism: Secondary | ICD-10-CM | POA: Insufficient documentation

## 2015-03-06 DIAGNOSIS — Z872 Personal history of diseases of the skin and subcutaneous tissue: Secondary | ICD-10-CM | POA: Insufficient documentation

## 2015-03-06 DIAGNOSIS — Z8639 Personal history of other endocrine, nutritional and metabolic disease: Secondary | ICD-10-CM | POA: Insufficient documentation

## 2015-03-06 DIAGNOSIS — Z862 Personal history of diseases of the blood and blood-forming organs and certain disorders involving the immune mechanism: Secondary | ICD-10-CM | POA: Insufficient documentation

## 2015-03-06 DIAGNOSIS — K219 Gastro-esophageal reflux disease without esophagitis: Secondary | ICD-10-CM | POA: Insufficient documentation

## 2015-03-06 DIAGNOSIS — M25571 Pain in right ankle and joints of right foot: Secondary | ICD-10-CM | POA: Diagnosis present

## 2015-03-06 DIAGNOSIS — Y9289 Other specified places as the place of occurrence of the external cause: Secondary | ICD-10-CM | POA: Diagnosis not present

## 2015-03-06 DIAGNOSIS — Y9389 Activity, other specified: Secondary | ICD-10-CM | POA: Insufficient documentation

## 2015-03-06 DIAGNOSIS — G894 Chronic pain syndrome: Secondary | ICD-10-CM | POA: Diagnosis not present

## 2015-03-06 MED ORDER — IBUPROFEN 800 MG PO TABS: 800.0000 mg | ORAL_TABLET | Freq: Three times a day (TID) | ORAL | Status: DC | PRN

## 2015-03-06 MED ORDER — HYDROCODONE-ACETAMINOPHEN 5-325 MG PO TABS
2.0000 | ORAL_TABLET | Freq: Once | ORAL | Status: AC
  Administered 2015-03-06: 2 via ORAL
  Filled 2015-03-06: qty 2

## 2015-03-06 MED ORDER — HYDROCODONE-ACETAMINOPHEN 5-325 MG PO TABS: 1.0000 | ORAL_TABLET | ORAL | Status: DC | PRN

## 2015-03-06 MED ORDER — IBUPROFEN 800 MG PO TABS
800.0000 mg | ORAL_TABLET | Freq: Once | ORAL | Status: AC
  Administered 2015-03-06: 800 mg via ORAL
  Filled 2015-03-06: qty 1

## 2015-03-06 NOTE — ED Notes (Signed)
MD at bedside. 

## 2015-03-06 NOTE — ED Provider Notes (Signed)
TIME SEEN: 6:00 AM  CHIEF COMPLAINT: Right ankle injury  HPI: Pt is a 39 y.o. female with history of asthma, previous DVT no longer on anticoagulation who presents emergency department with right ankle and foot pain after she twisted her ankle yesterday. She did not fall to the ground when this happened. Has been able to ambulate but is doing so with a limp. Denies numbness, tingling or focal weakness. No other injury.  ROS: See HPI Constitutional: no fever  Eyes: no drainage  ENT: no runny nose   Cardiovascular:  no chest pain  Resp: no SOB  GI: no vomiting GU: no dysuria Integumentary: no rash  Allergy: no hives  Musculoskeletal: no leg swelling  Neurological: no slurred speech ROS otherwise negative  PAST MEDICAL HISTORY/PAST SURGICAL HISTORY:  Past Medical History  Diagnosis Date  . Asthma     Cough variant  . DVT (deep venous thrombosis) 10 yrs ago  . Asthma   . Allergic rhinitis   . Hyperlipidemia   . Shortness of breath     SOB even without exertion at times  . GERD (gastroesophageal reflux disease)   . IBS (irritable bowel syndrome)   . 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 Anemia   . Eczema   . Abdominal pain, chronic, right upper quadrant   . Chronic pain syndrome 06/08/2013  . Dyslipidemia 08/11/2012  . Acute recurrent maxillary sinusitis 11/04/2014    MEDICATIONS:  Prior to Admission medications   Medication Sig Start Date End Date Taking? Authorizing Provider  albuterol (PROVENTIL HFA;VENTOLIN HFA) 108 (90 BASE) MCG/ACT inhaler Inhale 2 puffs into the lungs every 4 (four) hours as needed for wheezing or shortness of breath. 01/05/14  Yes Kerri Perches, MD  benzonatate (TESSALON) 100 MG capsule Take 1 capsule (100 mg total) by mouth 3 (three) times daily. 11/18/14  Yes Elliot Cousin, MD  CALCIUM PO Take 1 tablet by mouth daily.   Yes Historical Provider, MD  desipramine (NORPRAMIN) 50 MG tablet Take 50 mg by mouth at bedtime.   Yes Historical  Provider, MD  diclofenac sodium (VOLTAREN) 1 % GEL Apply 4 g topically 3 (three) times daily as needed (For knee pain). 11/18/14  Yes Elliot Cousin, MD  fluticasone [facility]) 50 MCG/ACT nasal spray USE TWO SPRAY(S) IN EACH NOSTRIL ONCE DAILY 06/20/14  Yes Kerri Perches, MD  Fluticasone Furoate-Vilanterol 100-25 MCG/INH AEPB Inhale 1 puff into the lungs daily. 11/18/14  Yes Elliot Cousin, MD  ipratropium-albuterol (DUONEB) 0.5-2.5 (3) MG/3ML SOLN Take 3 mLs by nebulization every 6 (six) hours as needed (shortness of breath).    Yes Historical Provider, MD  levocetirizine (XYZAL) 5 MG tablet TAKE ONE TABLET BY MOUTH ONCE DAILY IN THE EVENING 12/28/14  Yes Kerri Perches, MD  levofloxacin (LEVAQUIN) 500 MG tablet Take 1 tablet (500 mg total) by mouth daily. Antibiotic. 11/18/14  Yes Elliot Cousin, MD  montelukast (SINGULAIR) 10 MG tablet TAKE ONE TABLET BY MOUTH ONCE DAILY IN THE MORNING 02/05/15  Yes Kerri Perches, MD  Multiple Vitamins-Minerals (ONE DAILY FOR WOMEN) TABS Take 1 tablet by mouth every morning.   Yes Historical Provider, MD  omeprazole (PRILOSEC) 20 MG capsule Take 1 capsule (20 mg total) by mouth daily. 01/05/14  Yes Len Blalock, NP  predniSONE (DELTASONE) 10 MG tablet Starting tomorrow, take 6 tablets daily for 1 day; then 5 tablets the next day for 1 day; then 4 tablets the next day for 1  day; then 3 tablets the next day for 1 day; then 2 tablets the next day for 1 day; then 1 tablet the next day, then stop. 11/18/14  Yes Elliot Cousin, MD  promethazine-dextromethorphan (PROMETHAZINE-DM) 6.25-15 MG/5ML syrup Take 5 mLs by mouth at bedtime as needed for cough. 10/31/14  Yes Kerri Perches, MD  ranitidine (ZANTAC) 150 MG tablet TAKE ONE TABLET BY MOUTH TWICE DAILY 08/28/14  Yes Kerri Perches, MD  Vitamin D, Ergocalciferol, (DRISDOL) 50000 UNITS CAPS capsule Take 1 capsule (50,000 Units total) by mouth every 7 (seven) days. Patient taking differently: Take 50,000 Units by mouth  every 7 (seven) days. Take on Thursday. 11/01/14  Yes Kerri Perches, MD    ALLERGIES:  Allergies  Allergen Reactions  . Compazine Other (See Comments)    Face swells, tongue sticks out    SOCIAL HISTORY:  History  Substance Use Topics  . Smoking status: Former Smoker -- 0.25 packs/day for 10 years    Types: Cigarettes    Quit date: 10/13/2008  . Smokeless tobacco: Never Used  . Alcohol Use: No    FAMILY HISTORY: Family History  Problem Relation Age of Onset  . Asthma Mother   . Hypertension Mother   . Bronchiolitis Mother   . Hypertension Father   . Hyperlipidemia Father     recurrent rectum polyps   . Diabetes Sister   . Lupus Sister   . Depression Sister     EXAM: BP 139/93 mmHg  Pulse 88  Temp(Src) 98.3 F (36.8 C) (Oral)  Resp 18  Ht  (1.676 m)  Wt 170 lb (77.111 kg)  BMI 27.45 kg/m2  SpO2 97%  LMP 05/17/2012 CONSTITUTIONAL: Alert and oriented and responds appropriately to questions. Well-appearing; well-nourished HEAD: Normocephalic EYES: Conjunctivae clear, PERRL ENT: normal nose; no rhinorrhea; moist mucous membranes; pharynx without lesions noted NECK: Supple, no meningismus, no LAD  CARD: RRR; S1 and S2 appreciated; no murmurs, no clicks, no rubs, no gallops RESP: Normal chest excursion without splinting or tachypnea; breath sounds clear and equal bilaterally; no wheezes, no rhonchi, no rales, no hypoxia or respiratory distress, speaking full sentences ABD/GI: Normal bowel sounds; non-distended; soft, non-tender, no rebound, no guarding, no peritoneal signs BACK:  The back appears normal and is non-tender to palpation, there is no CVA tenderness EXT: Normal ROM in all joints; tender to palpation over the right foot, right lateral and medial malleolus and right mid fibula without bony deformity or ecchymosis or swelling; no edema; normal capillary refill; no cyanosis, no calf tenderness or swelling; 2+ DP pulses bilaterally, sensation to light  touch intact diffusely, compartments are soft; no ligamentous laxity of the right knee or ankle SKIN: Normal color for age and race; warm NEURO: Moves all extremities equally, sensation to light touch intact diffusely, cranial nerves II through XII intact PSYCH: The patient's mood and manner are appropriate. Grooming and personal hygiene are appropriate.  MEDICAL DECISION MAKING: Patient here with right foot and ankle injury. X-rays show chronic appearing lateral malleolus avulsion fracture which patient states she had several years ago. She is aware of this. I do not think this is acute. There is no other acute injury seen on x-rays. Have offered crutches but she states she has some at home. Have advised her to rest, elevate and apply ice several times a day. I feel she is safe to walk on her foot as she feels comfortable. We'll discharge with pain medication. Will give orthopedic follow-up information of  symptoms do not improve over the next week. Discussed return precautions. She verbalized understanding and is comfortable with this plan.       Layla MawKristen N Theone Bowell, DO 03/06/15 [id number]

## 2015-03-06 NOTE — ED Notes (Signed)
Pt states she twisted her right ankle yesterday.

## 2015-03-06 NOTE — Discharge Instructions (Signed)
Ankle Sprain °An ankle sprain is an injury to the strong, fibrous tissues (ligaments) that hold the bones of your ankle joint together.  °CAUSES °An ankle sprain is usually caused by a fall or by twisting your ankle. Ankle sprains most commonly occur when you step on the outer edge of your foot, and your ankle turns inward. People who participate in sports are more prone to these types of injuries.  °SYMPTOMS  °· Pain in your ankle. The pain may be present at rest or only when you are trying to stand or walk. °· Swelling. °· Bruising. Bruising may develop immediately or within 1 to 2 days after your injury. °· Difficulty standing or walking, particularly when turning corners or changing directions. °DIAGNOSIS  °Your caregiver will ask you details about your injury and perform a physical exam of your ankle to determine if you have an ankle sprain. During the physical exam, your caregiver will press on and apply pressure to specific areas of your foot and ankle. Your caregiver will try to move your ankle in certain ways. An X-ray exam may be done to be sure a bone was not broken or a ligament did not separate from one of the bones in your ankle (avulsion fracture).  °TREATMENT  °Certain types of braces can help stabilize your ankle. Your caregiver can make a recommendation for this. Your caregiver may recommend the use of medicine for pain. If your sprain is severe, your caregiver may refer you to a surgeon who helps to restore function to parts of your skeletal system (orthopedist) or a physical therapist. °HOME CARE INSTRUCTIONS  °· Apply ice to your injury for 1-2 days or as directed by your caregiver. Applying ice helps to reduce inflammation and pain. °· Put ice in a plastic bag. °· Place a towel between your skin and the bag. °· Leave the ice on for 15-20 minutes at a time, every 2 hours while you are awake. °· Only take over-the-counter or prescription medicines for pain, discomfort, or fever as directed by  your caregiver. °· Elevate your injured ankle above the level of your heart as much as possible for 2-3 days. °· If your caregiver recommends crutches, use them as instructed. Gradually put weight on the affected ankle. Continue to use crutches or a cane until you can walk without feeling pain in your ankle. °· If you have a plaster splint, wear the splint as directed by your caregiver. Do not rest it on anything harder than a pillow for the first 24 hours. Do not put weight on it. Do not get it wet. You may take it off to take a shower or bath. °· You may have been given an elastic bandage to wear around your ankle to provide support. If the elastic bandage is too tight (you have numbness or tingling in your foot or your foot becomes cold and blue), adjust the bandage to make it comfortable. °· If you have an air splint, you may blow more air into it or let air out to make it more comfortable. You may take your splint off at night and before taking a shower or bath. Wiggle your toes in the splint several times per day to decrease swelling. °SEEK MEDICAL CARE IF:  °· You have rapidly increasing bruising or swelling. °· Your toes feel extremely cold or you lose feeling in your foot. °· Your pain is not relieved with medicine. °SEEK IMMEDIATE MEDICAL CARE IF: °· Your toes are numb or blue. °·   You have severe pain that is increasing. °MAKE SURE YOU:  °· Understand these instructions. °· Will watch your condition. °· Will get help right away if you are not doing well or get worse. °Document Released: 09/29/2005 Document Revised: 06/23/2012 Document Reviewed: 10/11/2011 °ExitCare® Patient Information ©2015 [employer]. This information is not intended to replace advice given to you by your health care provider. Make sure you discuss any questions you have with your health care provider. ° ° ° °RICE: Routine Care for Injuries °The routine care of many injuries includes Rest, Ice, Compression, and Elevation (RICE). °HOME  CARE INSTRUCTIONS °· Rest is needed to allow your body to heal. Routine activities can usually be resumed when comfortable. Injured tendons and bones can take up to 6 weeks to heal. Tendons are the cord-like structures that attach muscle to bone. °· Ice following an injury helps keep the swelling down and reduces pain. °¨ Put ice in a plastic bag. °¨ Place a towel between your skin and the bag. °¨ Leave the ice on for 15-20 minutes, 3-4 times a day, or as directed by your health care provider. Do this while awake, for the first 24 to 48 hours. After that, continue as directed by your caregiver. °· Compression helps keep swelling down. It also gives support and helps with discomfort. If an elastic bandage has been applied, it should be removed and reapplied every 3 to 4 hours. It should not be applied tightly, but firmly enough to keep swelling down. Watch fingers or toes for swelling, bluish discoloration, coldness, numbness, or excessive pain. If any of these problems occur, remove the bandage and reapply loosely. Contact your caregiver if these problems continue. °· Elevation helps reduce swelling and decreases pain. With extremities, such as the arms, hands, legs, and feet, the injured area should be placed near or above the level of the heart, if possible. °SEEK IMMEDIATE MEDICAL CARE IF: °· You have persistent pain and swelling. °· You develop redness, numbness, or unexpected weakness. °· Your symptoms are getting worse rather than improving after several days. °These symptoms may indicate that further evaluation or further X-rays are needed. Sometimes, X-rays may not show a small broken bone (fracture) until 1 week or 10 days later. Make a follow-up appointment with your caregiver. Ask when your X-ray results will be ready. Make sure you get your X-ray results. °Document Released: 01/11/2001 Document Revised: 10/04/2013 Document Reviewed: 02/28/2011 °ExitCare® Patient Information ©2015 [employer]. This  information is not intended to replace advice given to you by your health care provider. Make sure you discuss any questions you have with your health care provider. ° °

## 2015-03-13 ENCOUNTER — Ambulatory Visit (INDEPENDENT_AMBULATORY_CARE_PROVIDER_SITE_OTHER): Payer: 59 | Admitting: Family Medicine

## 2015-03-13 ENCOUNTER — Encounter: Payer: Self-pay | Admitting: Family Medicine

## 2015-03-13 VITALS — BP 120/78 | HR 83 | Resp 16 | Ht 66.0 in | Wt 171.0 lb

## 2015-03-13 DIAGNOSIS — Z1211 Encounter for screening for malignant neoplasm of colon: Secondary | ICD-10-CM

## 2015-03-13 DIAGNOSIS — E559 Vitamin D deficiency, unspecified: Secondary | ICD-10-CM

## 2015-03-13 DIAGNOSIS — Z0000 Encounter for general adult medical examination without abnormal findings: Secondary | ICD-10-CM | POA: Diagnosis not present

## 2015-03-13 DIAGNOSIS — M150 Primary generalized (osteo)arthritis: Secondary | ICD-10-CM

## 2015-03-13 DIAGNOSIS — Z1159 Encounter for screening for other viral diseases: Secondary | ICD-10-CM

## 2015-03-13 DIAGNOSIS — Z1231 Encounter for screening mammogram for malignant neoplasm of breast: Secondary | ICD-10-CM

## 2015-03-13 DIAGNOSIS — E785 Hyperlipidemia, unspecified: Secondary | ICD-10-CM

## 2015-03-13 LAB — POC HEMOCCULT BLD/STL (OFFICE/1-CARD/DIAGNOSTIC): FECAL OCCULT BLD: NEGATIVE

## 2015-03-13 NOTE — Assessment & Plan Note (Signed)

## 2015-03-13 NOTE — Assessment & Plan Note (Signed)
1 month h/o generalized joint pains involving elbows and wrists, is currently being treated by orhto for knee and ankle pain, refer to rheumatology for furhter eval and management as deemed necesssary

## 2015-03-13 NOTE — Patient Instructions (Signed)
F/u in 5 month, call if you need me before  Reduce fried and fatty foods also sugar, keep active and ensure good rest  Weight loss goal of 8 to 10 pounds  You are referred for a mammogram and to see rheumatologist  Fasting lipid, Vit D and hIV in 5 month  ,Please work on good  health habits so that your health will improve. 1. Commitment to daily physical activity for 30 to 60  minutes, if you are able to do this.  2. Commitment to wise food choices. Aim for half of your  food intake to be vegetable and fruit, one quarter starchy foods, and one quarter protein. Try to eat on a regular schedule  3 meals per day, snacking between meals should be limited to vegetables or fruits or small portions of nuts. 64 ounces of water per day is generally recommended, unless you have specific health conditions, like heart failure or kidney failure where you will need to limit fluid intake.  3. Commitment to sufficient and a  good quality of physical and mental rest daily, generally between 6 to 8 hours per day.  WITH PERSISTANCE AND PERSEVERANCE, THE IMPOSSIBLE , BECOMES THE NORM!  Thanks [facility], we consider it a privelige to serve you.

## 2015-03-13 NOTE — Progress Notes (Signed)
   Subjective:    Patient ID: Jodi [name], female    DOB: 06/27/1976, 39 y.o.   MRN: [id number]  HPI Patient is in for annual physical exam. C/o generalized joint pain increased in last 1 month, elbows, wrists are new, is  being followed by orthopedics for knee and ankle pain. No other complaints or concerns   Review of Systems See HPI     Objective:   Physical Exam BP 120/78 mmHg  Pulse 83  Resp 16  Ht 5\' 6"  (1.676 m)  Wt 171 lb (77.565 kg)  BMI 27.61 kg/m2  SpO2 99%  LMP 05/17/2012  Pleasant well nourished female, alert and oriented x 3, in no cardio-pulmonary distress. Afebrile. HEENT No facial trauma or asymetry. Sinuses non tender.  Extra occullar muscles intact, External ears normal, tympanic membranes clear. Oropharynx moist, no exudate, good dentition. Neck: supple, no adenopathy,JVD or thyromegaly.No bruits.  Chest: Clear to ascultation bilaterally.No crackles or wheezes. Non tender to palpation  Breast: No asymetry,no masses or lumps. No tenderness. No nipple discharge or inversion. No axillary or supraclavicular adenopathy  Cardiovascular system; Heart sounds normal,  S1 and  S2 ,no S3.  No murmur, or thrill. Apical beat not displaced Peripheral pulses normal.  Abdomen: Soft, non tender, no organomegaly or masses. No bruits. Bowel sounds normal. No guarding, tenderness or rebound.  Rectal:  Normal sphincter tone. No mass.No rectal masses.  Guaiac negative stool.  GU: Deferred, pt sees gynecology  Musculoskeletal exam: Full ROM of spine, hips , shoulders and knees.No tenderness or limitation of movement in wrists or elbows on exam, no warmth or tenderness No deformity ,swelling or crepitus noted. No muscle wasting or atrophy.   Neurologic: Cranial nerves 2 to 12 intact. Power, tone ,sensation and reflexes normal throughout. No disturbance in gait. No tremor.  Skin: Intact, no ulceration, erythema , scaling or rash  noted. Pigmentation normal throughout  Psych; Normal mood and affect. Judgement and concentration normal        Assessment & Plan:  Annual physical exam Annual exam as documented. Counseling done  re healthy lifestyle involving commitment to 150 minutes exercise per week, heart healthy diet, and attaining healthy weight.The importance of adequate sleep also discussed. Regular seat belt use and home safety, is also discussed. Changes in health habits are decided on by the patient with goals and time frames  set for achieving them. Immunization and cancer screening needs are specifically addressed at this visit.    Primary generalized (osteo)arthritis 1 month h/o generalized joint pains involving elbows and wrists, is currently being treated by orhto for knee and ankle pain, refer to rheumatology for furhter eval and management as deemed necesssary

## 2015-03-28 ENCOUNTER — Ambulatory Visit (HOSPITAL_COMMUNITY): Payer: 59

## 2015-04-06 ENCOUNTER — Ambulatory Visit (HOSPITAL_COMMUNITY)
Admission: RE | Admit: 2015-04-06 | Discharge: 2015-04-06 | Disposition: A | Discharge status: Home / Self Care | Payer: 59 | Source: Ambulatory Visit | Attending: Family Medicine | Admitting: Family Medicine

## 2015-04-06 DIAGNOSIS — Z1231 Encounter for screening mammogram for malignant neoplasm of breast: Secondary | ICD-10-CM | POA: Diagnosis not present

## 2015-04-09 ENCOUNTER — Other Ambulatory Visit: Payer: Self-pay | Admitting: Family Medicine

## 2015-04-25 ENCOUNTER — Other Ambulatory Visit (HOSPITAL_COMMUNITY): Payer: Self-pay | Admitting: Orthopaedic Surgery

## 2015-04-25 DIAGNOSIS — M25562 Pain in left knee: Secondary | ICD-10-CM

## 2015-05-07 ENCOUNTER — Ambulatory Visit (HOSPITAL_COMMUNITY)
Admission: RE | Admit: 2015-05-07 | Discharge: 2015-05-07 | Disposition: A | Discharge status: Home / Self Care | Payer: 59 | Source: Ambulatory Visit | Attending: Orthopaedic Surgery | Admitting: Orthopaedic Surgery

## 2015-05-07 ENCOUNTER — Other Ambulatory Visit (HOSPITAL_COMMUNITY): Payer: 59

## 2015-05-07 DIAGNOSIS — M25562 Pain in left knee: Secondary | ICD-10-CM | POA: Insufficient documentation

## 2015-05-08 ENCOUNTER — Encounter: Payer: Self-pay | Admitting: Family Medicine

## 2015-05-08 ENCOUNTER — Ambulatory Visit (INDEPENDENT_AMBULATORY_CARE_PROVIDER_SITE_OTHER): Payer: 59 | Admitting: Family Medicine

## 2015-05-08 ENCOUNTER — Other Ambulatory Visit (HOSPITAL_COMMUNITY): Payer: 59

## 2015-05-08 ENCOUNTER — Other Ambulatory Visit: Payer: Self-pay | Admitting: Family Medicine

## 2015-05-08 VITALS — BP 124/78 | HR 96 | Temp 98.5°F | Resp 18 | Ht 66.0 in | Wt 166.1 lb

## 2015-05-08 DIAGNOSIS — J45901 Unspecified asthma with (acute) exacerbation: Secondary | ICD-10-CM | POA: Diagnosis not present

## 2015-05-08 MED ORDER — ALBUTEROL SULFATE HFA 108 (90 BASE) MCG/ACT IN AERS: 2.0000 | INHALATION_SPRAY | Freq: Four times a day (QID) | RESPIRATORY_TRACT | Status: DC | PRN

## 2015-05-08 MED ORDER — METHYLPREDNISOLONE ACETATE 80 MG/ML IJ SUSP
80.0000 mg | Freq: Once | INTRAMUSCULAR | Status: AC
  Administered 2015-05-08: 80 mg via INTRAMUSCULAR

## 2015-05-08 MED ORDER — PREDNISONE 5 MG (21) PO TBPK: 5.0000 mg | ORAL_TABLET | ORAL | Status: DC

## 2015-05-08 NOTE — Progress Notes (Signed)
   Subjective:    Patient ID: Jodi [name], female    DOB: 26-Oct-1975, 39 y.o.   MRN: [id number]  HPI  C/o increased cough and wheeze x 5 days, had sick exposure last week. Denies chills and has no documented fever.States that hersymptoms have progressively worsened ,a dn she needs to be out of work today as a result of this Denies sinus pressure or nasal drainage Needs some of her asthma medications, reports having no neb solution Otherwise well   Review of Systems See HPI     Objective:   Physical Exam BP 124/78 mmHg  Pulse 96  Temp(Src) 98.5 F (36.9 C) (Oral)  Resp 18  Ht  (1.676 m)  Wt 166 lb 1.9 oz (75.352 kg)  BMI 26.83 kg/m2  SpO2 98%  LMP 05/17/2012 Patient alert and oriented and in no cardiopulmonary distress.  HEENT: No facial asymmetry, EOMI,   oropharynx pink and moist.  Neck supple no JVD, no mass. No sinus tenderness, TM clear Chest: decreased air entry, with bilateral wheezes, no crackles CVS: S1, S2 no murmurs, no S3.Regular rate.  ABD: Soft non tender.   Ext: No edema          Assessment & Plan:  Asthma Acute flare, uncontrolled currently, work excuse x 1 day Neb treatment , depo medrol and short course of prednisone Neb solution for home use prescribed also Continue maintenance therapy as before

## 2015-05-08 NOTE — Patient Instructions (Signed)
F/u as before,  Call if need me sooner   You are treated for asthma flare, no sign of infection Neb treatment , depo medrol, in office , prednisone dose pack as well as albuterol MDI and neb solutions  Nurse will discuss home equipment

## 2015-05-09 DIAGNOSIS — J45901 Unspecified asthma with (acute) exacerbation: Secondary | ICD-10-CM | POA: Diagnosis not present

## 2015-05-09 MED ORDER — IPRATROPIUM BROMIDE 0.02 % IN SOLN
0.5000 mg | Freq: Once | RESPIRATORY_TRACT | Status: AC
  Administered 2015-05-09: 0.5 mg via RESPIRATORY_TRACT

## 2015-05-09 MED ORDER — ALBUTEROL SULFATE (2.5 MG/3ML) 0.083% IN NEBU
2.5000 mg | INHALATION_SOLUTION | Freq: Once | RESPIRATORY_TRACT | Status: AC
  Administered 2015-05-09: 2.5 mg via RESPIRATORY_TRACT

## 2015-05-09 NOTE — Assessment & Plan Note (Signed)
Acute flare, uncontrolled currently, work excuse x 1 day Neb treatment , depo medrol and short course of prednisone Neb solution for home use prescribed also Continue maintenance therapy as before

## 2015-06-04 ENCOUNTER — Telehealth: Payer: Self-pay

## 2015-06-04 NOTE — Telephone Encounter (Signed)
I

## 2015-06-04 NOTE — Telephone Encounter (Signed)
pls let pt know that I spoke with Dr Juanetta Gosling explaied that she is having uncontrolled symptoms for the 2nd time in less than 1 month, asked if there was an access issue, (he actually said that she had missed her last appt,) and that he would work her in today

## 2015-06-04 NOTE — Telephone Encounter (Signed)
Patient confirmed that she has an appt to see Dr. Juanetta Gosling on 8/23

## 2015-07-10 ENCOUNTER — Ambulatory Visit: Payer: Self-pay | Admitting: Allergy and Immunology

## 2015-07-15 ENCOUNTER — Other Ambulatory Visit: Payer: Self-pay | Admitting: Family Medicine

## 2015-07-28 ENCOUNTER — Emergency Department (HOSPITAL_COMMUNITY): Payer: 59

## 2015-07-28 ENCOUNTER — Inpatient Hospital Stay (HOSPITAL_COMMUNITY)
Admission: EM | Admit: 2015-07-28 | Discharge: 2015-08-07 | DRG: 908 | Disposition: A | Discharge status: Home Health | Payer: 59 | Attending: General Surgery | Admitting: General Surgery

## 2015-07-28 ENCOUNTER — Encounter (HOSPITAL_COMMUNITY): Payer: Self-pay | Admitting: Emergency Medicine

## 2015-07-28 DIAGNOSIS — J45909 Unspecified asthma, uncomplicated: Secondary | ICD-10-CM | POA: Diagnosis present

## 2015-07-28 DIAGNOSIS — S3210XA Unspecified fracture of sacrum, initial encounter for closed fracture: Secondary | ICD-10-CM | POA: Diagnosis present

## 2015-07-28 DIAGNOSIS — S32049A Unspecified fracture of fourth lumbar vertebra, initial encounter for closed fracture: Secondary | ICD-10-CM | POA: Diagnosis present

## 2015-07-28 DIAGNOSIS — Y9352 Activity, horseback riding: Secondary | ICD-10-CM

## 2015-07-28 DIAGNOSIS — S32039A Unspecified fracture of third lumbar vertebra, initial encounter for closed fracture: Secondary | ICD-10-CM | POA: Diagnosis present

## 2015-07-28 DIAGNOSIS — S93401A Sprain of unspecified ligament of right ankle, initial encounter: Secondary | ICD-10-CM | POA: Diagnosis present

## 2015-07-28 DIAGNOSIS — S32009A Unspecified fracture of unspecified lumbar vertebra, initial encounter for closed fracture: Secondary | ICD-10-CM | POA: Diagnosis present

## 2015-07-28 DIAGNOSIS — S8261XA Displaced fracture of lateral malleolus of right fibula, initial encounter for closed fracture: Secondary | ICD-10-CM | POA: Diagnosis present

## 2015-07-28 DIAGNOSIS — Z7951 Long term (current) use of inhaled steroids: Secondary | ICD-10-CM

## 2015-07-28 DIAGNOSIS — D573 Sickle-cell trait: Secondary | ICD-10-CM | POA: Diagnosis present

## 2015-07-28 DIAGNOSIS — S381XXA Crushing injury of abdomen, lower back, and pelvis, initial encounter: Secondary | ICD-10-CM | POA: Diagnosis not present

## 2015-07-28 DIAGNOSIS — S32029A Unspecified fracture of second lumbar vertebra, initial encounter for closed fracture: Secondary | ICD-10-CM | POA: Diagnosis present

## 2015-07-28 DIAGNOSIS — S93409A Sprain of unspecified ligament of unspecified ankle, initial encounter: Secondary | ICD-10-CM | POA: Diagnosis present

## 2015-07-28 DIAGNOSIS — S32810A Multiple fractures of pelvis with stable disruption of pelvic ring, initial encounter for closed fracture: Secondary | ICD-10-CM | POA: Diagnosis present

## 2015-07-28 DIAGNOSIS — K567 Ileus, unspecified: Secondary | ICD-10-CM | POA: Diagnosis not present

## 2015-07-28 DIAGNOSIS — Z87891 Personal history of nicotine dependence: Secondary | ICD-10-CM | POA: Diagnosis not present

## 2015-07-28 DIAGNOSIS — D62 Acute posthemorrhagic anemia: Secondary | ICD-10-CM | POA: Diagnosis not present

## 2015-07-28 DIAGNOSIS — R140 Abdominal distension (gaseous): Secondary | ICD-10-CM

## 2015-07-28 DIAGNOSIS — M549 Dorsalgia, unspecified: Secondary | ICD-10-CM | POA: Diagnosis present

## 2015-07-28 DIAGNOSIS — T1490XA Injury, unspecified, initial encounter: Secondary | ICD-10-CM

## 2015-07-28 DIAGNOSIS — Z79899 Other long term (current) drug therapy: Secondary | ICD-10-CM | POA: Diagnosis not present

## 2015-07-28 DIAGNOSIS — T148XXA Other injury of unspecified body region, initial encounter: Secondary | ICD-10-CM

## 2015-07-28 HISTORY — DX: Sickle-cell trait: D57.3

## 2015-07-28 HISTORY — DX: Dorsalgia, unspecified: M54.9

## 2015-07-28 LAB — CBC WITH DIFFERENTIAL/PLATELET
BASOS ABS: 0 10*3/uL (ref 0.0–0.1)
BASOS PCT: 0 %
EOS ABS: 0.1 10*3/uL (ref 0.0–0.7)
EOS PCT: 1 %
HCT: 33.2 % — ABNORMAL LOW (ref 36.0–46.0)
HEMOGLOBIN: 11.4 g/dL — AB (ref 12.0–15.0)
LYMPHS ABS: 2 10*3/uL (ref 0.7–4.0)
Lymphocytes Relative: 27 %
MCH: 30.2 pg (ref 26.0–34.0)
MCHC: 34.3 g/dL (ref 30.0–36.0)
MCV: 88.1 fL (ref 78.0–100.0)
Monocytes Absolute: 0.5 10*3/uL (ref 0.1–1.0)
Monocytes Relative: 7 %
NEUTROS PCT: 65 %
Neutro Abs: 4.8 10*3/uL (ref 1.7–7.7)
PLATELETS: 293 10*3/uL (ref 150–400)
RBC: 3.77 MIL/uL — AB (ref 3.87–5.11)
RDW: 13.2 % (ref 11.5–15.5)
WBC: 7.3 10*3/uL (ref 4.0–10.5)

## 2015-07-28 LAB — COMPREHENSIVE METABOLIC PANEL
ALT: 16 U/L (ref 14–54)
AST: 26 U/L (ref 15–41)
Albumin: 3.6 g/dL (ref 3.5–5.0)
Alkaline Phosphatase: 74 U/L (ref 38–126)
Anion gap: 7 (ref 5–15)
BILIRUBIN TOTAL: 0.6 mg/dL (ref 0.3–1.2)
BUN: 7 mg/dL (ref 6–20)
CALCIUM: 8.9 mg/dL (ref 8.9–10.3)
CHLORIDE: 105 mmol/L (ref 101–111)
CO2: 24 mmol/L (ref 22–32)
CREATININE: 0.92 mg/dL (ref 0.44–1.00)
Glucose, Bld: 118 mg/dL — ABNORMAL HIGH (ref 65–99)
Potassium: 3.7 mmol/L (ref 3.5–5.1)
Sodium: 136 mmol/L (ref 135–145)
TOTAL PROTEIN: 6.2 g/dL — AB (ref 6.5–8.1)

## 2015-07-28 LAB — I-STAT BETA HCG BLOOD, ED (MC, WL, AP ONLY)

## 2015-07-28 MED ORDER — MORPHINE SULFATE (PF) 4 MG/ML IV SOLN
4.0000 mg | Freq: Once | INTRAVENOUS | Status: AC
  Administered 2015-07-28: 4 mg via INTRAVENOUS
  Filled 2015-07-28: qty 1

## 2015-07-28 MED ORDER — OXYCODONE HCL 5 MG PO TABS
5.0000 mg | ORAL_TABLET | ORAL | Status: DC | PRN
  Administered 2015-07-29: 5 mg via ORAL
  Filled 2015-07-28: qty 1

## 2015-07-28 MED ORDER — DOCUSATE SODIUM 100 MG PO CAPS
100.0000 mg | ORAL_CAPSULE | Freq: Two times a day (BID) | ORAL | Status: DC
  Administered 2015-07-28 – 2015-08-06 (×16): 100 mg via ORAL
  Filled 2015-07-28 (×18): qty 1

## 2015-07-28 MED ORDER — OXYCODONE HCL 5 MG PO TABS
10.0000 mg | ORAL_TABLET | ORAL | Status: DC | PRN
  Administered 2015-07-28 – 2015-07-30 (×8): 10 mg via ORAL
  Filled 2015-07-28 (×9): qty 2

## 2015-07-28 MED ORDER — ONDANSETRON HCL 4 MG PO TABS
4.0000 mg | ORAL_TABLET | Freq: Four times a day (QID) | ORAL | Status: DC | PRN
  Administered 2015-08-01: 4 mg via ORAL
  Filled 2015-07-28: qty 1

## 2015-07-28 MED ORDER — ACETAMINOPHEN 325 MG PO TABS
650.0000 mg | ORAL_TABLET | ORAL | Status: DC | PRN
  Administered 2015-07-29 – 2015-08-05 (×6): 650 mg via ORAL
  Filled 2015-07-28 (×6): qty 2

## 2015-07-28 MED ORDER — ONDANSETRON HCL 4 MG/2ML IJ SOLN
4.0000 mg | Freq: Four times a day (QID) | INTRAMUSCULAR | Status: DC | PRN
  Administered 2015-07-28: 4 mg via INTRAVENOUS
  Filled 2015-07-28 (×2): qty 2

## 2015-07-28 MED ORDER — HYDROMORPHONE HCL 1 MG/ML IJ SOLN
0.5000 mg | INTRAMUSCULAR | Status: DC | PRN
  Administered 2015-07-28 – 2015-07-30 (×9): 1 mg via INTRAVENOUS
  Filled 2015-07-28 (×9): qty 1

## 2015-07-28 MED ORDER — KCL IN DEXTROSE-NACL 20-5-0.45 MEQ/L-%-% IV SOLN
INTRAVENOUS | Status: DC
  Administered 2015-07-28 – 2015-07-30 (×3): via INTRAVENOUS
  Filled 2015-07-28 (×3): qty 1000

## 2015-07-28 MED ORDER — IPRATROPIUM-ALBUTEROL 0.5-2.5 (3) MG/3ML IN SOLN: 3.0000 mL | RESPIRATORY_TRACT | Status: DC | PRN

## 2015-07-28 MED ORDER — IOHEXOL 300 MG/ML  SOLN
100.0000 mL | Freq: Once | INTRAMUSCULAR | Status: AC | PRN
  Administered 2015-07-28: 100 mL via INTRAVENOUS

## 2015-07-28 NOTE — H&P (Signed)
Jodi [name] is an 39 y.o. female.   Chief Complaint: Back pain, pelvic pain, right ankle pain HPI: Korey was riding a horse on a trail with a group of people. Her horse reared up and she fell off. The horse then fell on top of her. It laid across her pelvis area and her right ankle. She did not lose consciousness. Other members of the group assisted getting the horse off of her. She was not able to get up. She was transported by EMS as a level II trauma. Her workup has revealed pelvic fractures including right sacral fracture and AP compression pattern and left lumbar 2 through 4 transverse process fractures. I was asked to see her for admission to the trauma service. She was also noted to have a very small amount of pelvic free fluid.  Past Medical History  Diagnosis Date  . Asthma   . Back pain   . Sickle-cell trait [facility])     Past Surgical History  Procedure Laterality Date  . Cholecystectomy    . Abdominal hysterectomy      No family history on file. Social History:  reports that she has quit smoking. She does not have any smokeless tobacco history on file. She reports that she does not drink alcohol or use illicit drugs.  Allergies:  Allergies  Allergen Reactions  . Compazine [Prochlorperazine]      (Not in a hospital admission)  Results for orders placed or performed during the hospital encounter of 07/28/15 (from the past 48 hour(s))  Comprehensive metabolic panel     Status: Abnormal   Collection Time: 07/28/15 12:40 PM  Result Value Ref Range   Sodium 136 135 - 145 mmol/L   Potassium 3.7 3.5 - 5.1 mmol/L   Chloride 105 101 - 111 mmol/L   CO2 24 22 - 32 mmol/L   Glucose, Bld 118 (H) 65 - 99 mg/dL   BUN 7 6 - 20 mg/dL   Creatinine, Ser 0.92 0.44 - 1.00 mg/dL   Calcium 8.9 8.9 - 10.3 mg/dL   Total Protein 6.2 (L) 6.5 - 8.1 g/dL   Albumin 3.6 3.5 - 5.0 g/dL   AST 26 15 - 41 U/L   ALT 16 14 - 54 U/L   Alkaline Phosphatase 74 38 - 126 U/L   Total Bilirubin 0.6  0.3 - 1.2 mg/dL   GFR calc non Af Amer >60 >60 mL/min   GFR calc Af Amer >60 >60 mL/min    Comment: (NOTE) The eGFR has been calculated using the CKD EPI equation. This calculation has not been validated in all clinical situations. eGFR's persistently <60 mL/min signify possible Chronic Kidney Disease.    Anion gap 7 5 - 15  CBC with Differential     Status: Abnormal   Collection Time: 07/28/15 12:40 PM  Result Value Ref Range   WBC 7.3 4.0 - 10.5 K/uL   RBC 3.77 (L) 3.87 - 5.11 MIL/uL   Hemoglobin 11.4 (L) 12.0 - 15.0 g/dL   HCT 33.2 (L) 36.0 - 46.0 %   MCV 88.1 78.0 - 100.0 fL   MCH 30.2 26.0 - 34.0 pg   MCHC 34.3 30.0 - 36.0 g/dL   RDW 13.2 11.5 - 15.5 %   Platelets 293 150 - 400 K/uL   Neutrophils Relative % 65 %   Neutro Abs 4.8 1.7 - 7.7 K/uL   Lymphocytes Relative 27 %   Lymphs Abs 2.0 0.7 - 4.0 K/uL   Monocytes Relative 7 %  Monocytes Absolute 0.5 0.1 - 1.0 K/uL   Eosinophils Relative 1 %   Eosinophils Absolute 0.1 0.0 - 0.7 K/uL   Basophils Relative 0 %   Basophils Absolute 0.0 0.0 - 0.1 K/uL  I-Stat Beta hCG blood, ED ([facility], [facility], [facility] only)     Status: None   Collection Time: 07/28/15 12:58 PM  Result Value Ref Range   I-stat hCG, quantitative <5.0 <5 mIU/mL   Comment 3            Comment:   GEST. AGE      CONC.  (mIU/mL)   <=1 WEEK        5 - 50     2 WEEKS       50 - 500     3 WEEKS       100 - 10,000     4 WEEKS     1,000 - 30,000        FEMALE AND NON-PREGNANT FEMALE:     LESS THAN 5 mIU/mL    Dg Ankle Complete Right  07/28/2015  CLINICAL DATA:  Pt fell from horse today. Right ankle pain. Could not move right leg from other injuries. EXAM: RIGHT ANKLE - COMPLETE 3+ VIEW COMPARISON:  None. FINDINGS: Lateral view suboptimal secondary to cross table technique. There is an os ossific fragment distal to the lateral malleolus which is felt to be corticated, likely related to remote trauma. Otherwise, no fracture is seen. There is apparent irregularity about the  lateral midfoot on the suboptimal lateral view. This could be projectional. The posterior aspect of the fibula also appears irregular on the attempted lateral view, indeterminate. IMPRESSION: Suboptimal lateral view secondary to patient positioning and cross-table technique. Given this factor, no definite acute osseous finding. Probable remote lateral malleolar fracture. Apparent osseous irregularity about the lateral midfoot and posterior fibula favored to be projectional. Depending on clinical symptoms, consider repeat radiographs when patient is able to be placed in a standard lateral position. Electronically Signed   By: Abigail Miyamoto M.D.   On: 07/28/2015 15:26   Ct Abdomen Pelvis W Contrast  07/28/2015  CLINICAL DATA:  Injury sustained while horseback riding today when a horse fell on top of the patient. Initial encounter. EXAM: CT ABDOMEN AND PELVIS WITH CONTRAST TECHNIQUE: Multidetector CT imaging of the abdomen and pelvis was performed using the standard protocol following bolus administration of intravenous contrast. CONTRAST:  100 mL OMNIPAQUE IOHEXOL 300 MG/ML  SOLN COMPARISON:  None. FINDINGS: The lung bases are clear.  No pleural or pericardial effusion. A few tiny cysts are identified in the liver. The liver is otherwise unremarkable. The gallbladder has been removed. The spleen, adrenal glands pancreas appear normal. Two tiny low attenuating lesions in the right kidney cannot be definitively characterized but are likely cysts. The left kidney is unremarkable. The patient is status post hysterectomy. Adnexa are unremarkable. The stomach, small and large bowel and appendix appear normal. Major vascular structures are unremarkable. There is no lymphadenopathy. A small amount of fluid is seen in the anterior right lower quadrant and right hemipelvis. No lymphadenopathy is identified. The patient has a mildly comminuted right sacral fracture. Transverse process fractures on the left at L2, L3 and L4  are identified. The right pubic bone is slightly elevated compared to the left but the pubic symphysis is not widened. The sacroiliac joints are unremarkable. IMPRESSION: Right sacral fracture and left transverse process fractures L2-L4. Small volume of free pelvic fluid could be due  to physiologic change or a mild venous bleed. No injury to the solid visceral is identified. Electronically Signed   By: Inge Rise M.D.   On: 07/28/2015 15:17   Dg Pelvis Portable  07/28/2015  CLINICAL DATA:  Fall off horse, severe pelvic pain EXAM: PORTABLE PELVIS 1-2 VIEWS COMPARISON:  None. FINDINGS: Diastases of the pubic symphysis. No generally, there is associated disruption of an SI joint or a sacral fracture. In this case, the right SI joint may be slightly widened, although this is equivocal. IMPRESSION: Diastases of the pubic symphysis. Possible associated widening of the right SI joint. Electronically Signed   By: Julian Hy M.D.   On: 07/28/2015 13:36   Ct L-spine No Charge  07/28/2015  CLINICAL DATA:  Injury sustained while horseback riding today. A horse fell on top of the patient. Back pain. Initial encounter. EXAM: CT LUMBAR SPINE WITHOUT CONTRAST TECHNIQUE: Multidetector CT imaging of the lumbar spine was performed without intravenous contrast administration. Multiplanar CT image reconstructions were also generated. COMPARISON:  None. FINDINGS: The patient has transverse process fractures on the left at L2, L3 and L4. Vertebral body height and alignment are normal. The facet joints are intact and unremarkable in appearance. Right sacral fracture is noted. Shallow disc bulge is seen at L4-5 but the central canal and foramina appear open at all imaged levels. IMPRESSION: Left transverse process fractures L2-L4 and right sacral fracture. Electronically Signed   By: Inge Rise M.D.   On: 07/28/2015 15:23   Dg Chest Port 1 View  07/28/2015  CLINICAL DATA:  Fall off horse EXAM: PORTABLE CHEST 1  VIEW COMPARISON:  None. FINDINGS: Lungs are clear.  No pleural effusion or pneumothorax. The heart is normal in size. IMPRESSION: No evidence of acute cardiopulmonary disease. Electronically Signed   By: Julian Hy M.D.   On: 07/28/2015 13:57    Review of Systems  Constitutional: Negative for fever and chills.  HENT: Negative.   Eyes: Negative for blurred vision and double vision.  Respiratory: Negative for cough and shortness of breath.   Cardiovascular: Negative for chest pain.  Gastrointestinal: Negative for nausea, vomiting and abdominal pain.  Genitourinary: Negative.   Musculoskeletal:       See history of present illness, includes right ankle pain  Skin: Negative.   Neurological: Negative for sensory change and loss of consciousness.  Endo/Heme/Allergies:       Sickle-cell trait  Psychiatric/Behavioral: Negative.     Blood pressure 113/86, pulse 96, temperature 97.7 F (36.5 C), temperature source Oral, resp. rate 13, height _0  (1.676 m), weight 79.379 kg (175 lb), SpO2 97 %. Physical Exam  Constitutional: She is oriented to person, place, and time. She appears well-developed and well-nourished. No distress.  HENT:  Head: Head is without abrasion and without contusion.  Right Ear: Hearing, tympanic membrane, external ear and ear canal normal. No hemotympanum.  Left Ear: No hemotympanum.  Nose: No sinus tenderness or nasal deformity.  Mouth/Throat: Uvula is midline, oropharynx is clear and moist and mucous membranes are normal.  Eyes: EOM are normal. Pupils are equal, round, and reactive to light. Right eye exhibits no discharge. Left eye exhibits no discharge. No scleral icterus.  Neck: Neck supple. No tracheal deviation present.  No posterior midline tenderness, no pain on active range of motion  Cardiovascular: Normal rate, regular rhythm, normal heart sounds and intact distal pulses.   Respiratory: No stridor. No respiratory distress. She has no wheezes. She has  no rales. She  exhibits no tenderness.  GI: Soft. She exhibits no distension. There is no tenderness. There is no rebound and no guarding.  Tenderness is present directly over her symphysis pubis  Musculoskeletal:  Mild tenderness right ankle without significant deformity, tenderness left lower back and right lower back  Neurological: She is alert and oriented to person, place, and time. She displays no atrophy and no tremor. No cranial nerve deficit or sensory deficit. She exhibits normal muscle tone. She displays no seizure activity. GCS eye subscore is 4. GCS verbal subscore is 5. GCS motor subscore is 6.  Lower extremity motor exam limited due to pain  Skin: Skin is warm and dry.  Psychiatric: She has a normal mood and affect.     Assessment/Plan Fall from horse and subsequently sat on by horse Pelvic ring fracture (?AP2) including symphysis disruption and right sacral FX Transverse process fractures L2-4 Right ankle sprain Asthma  Admit to trauma. Clear liquids until follow-up exam of her abdomen in the morning. Orthopedics consult. Dr. Lorin Mercy has been consulted by the emergency department. Will keep on bedrest pending orthopedic evaluation of her pelvic fractures and her right ankle. Duonebs PRN for her asthma. Will place on home meds once obtained by pharmacy.  Analycia Khokhar E 07/28/2015, 4:18 PM

## 2015-07-28 NOTE — ED Notes (Signed)
Received pt via EMS with c/o while horse riding horse raised up causing saddle to fall back. Pt fell off horse and horse sat on pt. When horse when to get up, horse kicked pt in the face.

## 2015-07-28 NOTE — ED Notes (Signed)
Patient transported to X-ray 

## 2015-07-28 NOTE — ED Notes (Signed)
Trauma MD at bedside.

## 2015-07-28 NOTE — Progress Notes (Signed)
   07/28/15 1300  Clinical Encounter Type  Visited With Patient  Visit Type Spiritual support  Referral From Nurse  Spiritual Encounters  Spiritual Needs Prayer  Stress Factors  Patient Stress Factors Lack of knowledge;Health changes  Patient brought in to trauma after horse fell on her. Had adopted niece with her, no family. Spoke to patient, who requested prayer, and chaplain prayed with her.

## 2015-07-28 NOTE — ED Provider Notes (Signed)
CSN: [id number]     Arrival date & time 07/28/15  1240 History   First MD Initiated Contact with Patient 07/28/15 1246     No chief complaint on file.    (Consider location/radiation/quality/duration/timing/severity/associated sxs/prior Treatment) HPI Comments: Patient is a 39 year old female with past medical history of asthma and low back surgery. She presents for evaluation after a fall from a horse. She [place] became spooked, she fell backward and the horse then sat on top of her. She is complaining of pain in her pelvis and low back. She denies any loss of consciousness, neck pain, chest pain, difficulty breathing. She was unable to stand and ambulate and EMS had to transport her through the [place] to the vehicle.  The history is provided by the patient.    No past medical history on file. No past surgical history on file. No family history on file. Social History  Substance Use Topics  . Smoking status: Not on file  . Smokeless tobacco: Not on file  . Alcohol Use: Not on file   OB History    No data available     Review of Systems  All other systems reviewed and are negative.     Allergies  Review of patient's allergies indicates not on file.  Home Medications   Prior to Admission medications   Not on File   There were no vitals taken for this visit. Physical Exam  Constitutional: She is oriented to person, place, and time. She appears well-developed and well-nourished. No distress.  HENT:  Head: Normocephalic and atraumatic.  Mouth/Throat: Oropharynx is clear and moist.  Eyes: EOM are normal. Pupils are equal, round, and reactive to light.  Neck: Normal range of motion. Neck supple.  There is no cervical spine tenderness or step-off. She has painless range of motion in all directions.  Cardiovascular: Normal rate and regular rhythm.  Exam reveals no gallop and no friction rub.   No murmur heard. Pulmonary/Chest:  Effort normal and breath sounds normal. No respiratory distress. She has no wheezes.  Abdominal: Soft. Bowel sounds are normal. She exhibits no distension. There is no tenderness.  Musculoskeletal: Normal range of motion.  The extremities are all seemingly atraumatic. There is no deformity. There is no shortening or external rotation of the legs. DP and PT pulses are easily palpable and motor and sensation are intact to both feet.  There is tenderness to palpation in the pelvis, however there appears to be no gross instability.  There is tenderness to palpation in the lumbar region. There is no bony tenderness or step-off.  Neurological: She is alert and oriented to person, place, and time. No cranial nerve deficit. She exhibits normal muscle tone. Coordination normal.  Skin: Skin is warm and dry. She is not diaphoretic.  Nursing note and vitals reviewed.   ED Course  Procedures (including critical care time) Labs Review Labs Reviewed  COMPREHENSIVE METABOLIC PANEL  CBC WITH DIFFERENTIAL/PLATELET  I-STAT BETA HCG BLOOD, ED ([facility], [facility], [facility] ONLY)    Imaging Review No results found. I have personally reviewed and evaluated these images and lab results as part of my medical decision-making.   EKG Interpretation None      MDM   Final diagnoses:  None    Patient presents by EMS after a fall from a horse. She is complaining of pain in her pelvis and low back. She denies any neck pain, headache, loss of consciousness, chest  pain, did clear breathing, or abdominal pain. Her cervical spine was cleared clinically in the collar removed. She then went for imaging studies of her abdomen, pelvis, lumbar spine, and right ankle. This revealed several transverse process fractures, a sacral fracture, and the question of a pubic symphysis injury.  I discussed these findings with Dr. Janee Morn from trauma surgery who will admit the patient to the trauma service. He is asked that I speak with  orthopedics. I've also spoken with Dr. Ophelia Charter from orthopedics who will evaluate the patient in the morning.    Geoffery Lyons, MD 07/28/15 [id number]

## 2015-07-29 ENCOUNTER — Inpatient Hospital Stay (HOSPITAL_COMMUNITY): Payer: 59

## 2015-07-29 LAB — CBC
HCT: 30.6 % — ABNORMAL LOW (ref 36.0–46.0)
Hemoglobin: 10.3 g/dL — ABNORMAL LOW (ref 12.0–15.0)
MCH: 29.9 pg (ref 26.0–34.0)
MCHC: 33.7 g/dL (ref 30.0–36.0)
MCV: 89 fL (ref 78.0–100.0)
PLATELETS: 273 10*3/uL (ref 150–400)
RBC: 3.44 MIL/uL — AB (ref 3.87–5.11)
RDW: 13.4 % (ref 11.5–15.5)
WBC: 7.9 10*3/uL (ref 4.0–10.5)

## 2015-07-29 LAB — BASIC METABOLIC PANEL
Anion gap: 9 (ref 5–15)
BUN: 6 mg/dL (ref 6–20)
CALCIUM: 8.8 mg/dL — AB (ref 8.9–10.3)
CO2: 27 mmol/L (ref 22–32)
CREATININE: 0.84 mg/dL (ref 0.44–1.00)
Chloride: 100 mmol/L — ABNORMAL LOW (ref 101–111)
GFR calc Af Amer: >60 mL/min (ref >60)
Glucose, Bld: 116 mg/dL — ABNORMAL HIGH (ref 65–99)
Potassium: 3.5 mmol/L (ref 3.5–5.1)
SODIUM: 136 mmol/L (ref 135–145)

## 2015-07-29 MED ORDER — DIPHENHYDRAMINE HCL 25 MG PO CAPS
25.0000 mg | ORAL_CAPSULE | ORAL | Status: DC | PRN
  Administered 2015-07-29 – 2015-08-03 (×6): 25 mg via ORAL
  Filled 2015-07-29 (×6): qty 1

## 2015-07-29 MED ORDER — PANTOPRAZOLE SODIUM 40 MG PO TBEC
40.0000 mg | DELAYED_RELEASE_TABLET | Freq: Every day | ORAL | Status: DC
Start: 2015-07-29 — End: 2015-08-07
  Administered 2015-07-29 – 2015-08-07 (×10): 40 mg via ORAL
  Filled 2015-07-29 (×10): qty 1

## 2015-07-29 MED ORDER — MONTELUKAST SODIUM 10 MG PO TABS
10.0000 mg | ORAL_TABLET | Freq: Every day | ORAL | Status: DC
  Administered 2015-07-29: 10 mg via ORAL
  Filled 2015-07-29: qty 1

## 2015-07-29 NOTE — Consult Note (Signed)
Reason for Consult: Crush injury  by horse with the pelvic fracture, closed. Referring Physician: Dr. Lavone Neri, trauma service  Jodi [name] is an 39 y.o. female.  HPI: 39 year old female was riding her horse she has several horses rides year-round. Horse was being a difficult started trying to rule out up and saddle slipped she fell off on the horse sat back down on her injuring her pelvis as well as her right ankle. [facility] x-ray showed the 1-2 mm shift at the pubic symphysis. CT scan showed comminuted sacral fracture. Patient also had the transverse processes on the left L2, L3 and L4. Right pubic bone slightly elevated in comparison to the left. Right sacral fracture.  Past Medical History  Diagnosis Date  . Asthma   . Back pain   . Sickle-cell trait [facility])     Past Surgical History  Procedure Laterality Date  . Cholecystectomy    . Abdominal hysterectomy      No family history on file.  Social History:  reports that she has quit smoking. She does not have any smokeless tobacco history on file. She reports that she does not drink alcohol or use illicit drugs.  Allergies:  Allergies  Allergen Reactions  . Compazine [Prochlorperazine]     Can not control facial muscles    Medications: I have reviewed the patient's current medications.  Results for orders placed or performed during the hospital encounter of 07/28/15 (from the past 48 hour(s))  Comprehensive metabolic panel     Status: Abnormal   Collection Time: 07/28/15 12:40 PM  Result Value Ref Range   Sodium 136 135 - 145 mmol/L   Potassium 3.7 3.5 - 5.1 mmol/L   Chloride 105 101 - 111 mmol/L   CO2 24 22 - 32 mmol/L   Glucose, Bld 118 (H) 65 - 99 mg/dL   BUN 7 6 - 20 mg/dL   Creatinine, Ser 0.92 0.44 - 1.00 mg/dL   Calcium 8.9 8.9 - 10.3 mg/dL   Total Protein 6.2 (L) 6.5 - 8.1 g/dL   Albumin 3.6 3.5 - 5.0 g/dL   AST 26 15 - 41 U/L   ALT 16 14 - 54 U/L   Alkaline Phosphatase 74 38 - 126 U/L   Total Bilirubin 0.6 0.3 -  1.2 mg/dL   GFR calc non Af Amer >60 >60 mL/min   GFR calc Af Amer >60 >60 mL/min    Comment: (NOTE) The eGFR has been calculated using the CKD EPI equation. This calculation has not been validated in all clinical situations. eGFR's persistently <60 mL/min signify possible Chronic Kidney Disease.    Anion gap 7 5 - 15  CBC with Differential     Status: Abnormal   Collection Time: 07/28/15 12:40 PM  Result Value Ref Range   WBC 7.3 4.0 - 10.5 K/uL   RBC 3.77 (L) 3.87 - 5.11 MIL/uL   Hemoglobin 11.4 (L) 12.0 - 15.0 g/dL   HCT 33.2 (L) 36.0 - 46.0 %   MCV 88.1 78.0 - 100.0 fL   MCH 30.2 26.0 - 34.0 pg   MCHC 34.3 30.0 - 36.0 g/dL   RDW 13.2 11.5 - 15.5 %   Platelets 293 150 - 400 K/uL   Neutrophils Relative % 65 %   Neutro Abs 4.8 1.7 - 7.7 K/uL   Lymphocytes Relative 27 %   Lymphs Abs 2.0 0.7 - 4.0 K/uL   Monocytes Relative 7 %   Monocytes Absolute 0.5 0.1 - 1.0 K/uL  Eosinophils Relative 1 %   Eosinophils Absolute 0.1 0.0 - 0.7 K/uL   Basophils Relative 0 %   Basophils Absolute 0.0 0.0 - 0.1 K/uL  I-Stat Beta hCG blood, ED ([facility], [facility], [facility] only)     Status: None   Collection Time: 07/28/15 12:58 PM  Result Value Ref Range   I-stat hCG, quantitative <5.0 <5 mIU/mL   Comment 3            Comment:   GEST. AGE      CONC.  (mIU/mL)   <=1 WEEK        5 - 50     2 WEEKS       50 - 500     3 WEEKS       100 - 10,000     4 WEEKS     1,000 - 30,000        FEMALE AND NON-PREGNANT FEMALE:     LESS THAN 5 mIU/mL   CBC     Status: Abnormal   Collection Time: 07/29/15  4:47 AM  Result Value Ref Range   WBC 7.9 4.0 - 10.5 K/uL   RBC 3.44 (L) 3.87 - 5.11 MIL/uL   Hemoglobin 10.3 (L) 12.0 - 15.0 g/dL   HCT 30.6 (L) 36.0 - 46.0 %   MCV 89.0 78.0 - 100.0 fL   MCH 29.9 26.0 - 34.0 pg   MCHC 33.7 30.0 - 36.0 g/dL   RDW 13.4 11.5 - 15.5 %   Platelets 273 150 - 400 K/uL  Basic metabolic panel     Status: Abnormal   Collection Time: 07/29/15  4:47 AM  Result Value Ref Range   Sodium 136  135 - 145 mmol/L   Potassium 3.5 3.5 - 5.1 mmol/L   Chloride 100 (L) 101 - 111 mmol/L   CO2 27 22 - 32 mmol/L   Glucose, Bld 116 (H) 65 - 99 mg/dL   BUN 6 6 - 20 mg/dL   Creatinine, Ser 0.84 0.44 - 1.00 mg/dL   Calcium 8.8 (L) 8.9 - 10.3 mg/dL   GFR calc non Af Amer >60 >60 mL/min   GFR calc Af Amer >60 >60 mL/min    Comment: (NOTE) The eGFR has been calculated using the CKD EPI equation. This calculation has not been validated in all clinical situations. eGFR's persistently <60 mL/min signify possible Chronic Kidney Disease.    Anion gap 9 5 - 15    Dg Ankle Complete Right  07/28/2015  CLINICAL DATA:  Pt fell from horse today. Right ankle pain. Could not move right leg from other injuries. EXAM: RIGHT ANKLE - COMPLETE 3+ VIEW COMPARISON:  None. FINDINGS: Lateral view suboptimal secondary to cross table technique. There is an os ossific fragment distal to the lateral malleolus which is felt to be corticated, likely related to remote trauma. Otherwise, no fracture is seen. There is apparent irregularity about the lateral midfoot on the suboptimal lateral view. This could be projectional. The posterior aspect of the fibula also appears irregular on the attempted lateral view, indeterminate. IMPRESSION: Suboptimal lateral view secondary to patient positioning and cross-table technique. Given this factor, no definite acute osseous finding. Probable remote lateral malleolar fracture. Apparent osseous irregularity about the lateral midfoot and posterior fibula favored to be projectional. Depending on clinical symptoms, consider repeat radiographs when patient is able to be placed in a standard lateral position. Electronically Signed   By: Abigail Miyamoto M.D.   On: 07/28/2015 15:26  Ct Abdomen Pelvis W Contrast  07/28/2015  CLINICAL DATA:  Injury sustained while horseback riding today when a horse fell on top of the patient. Initial encounter. EXAM: CT ABDOMEN AND PELVIS WITH CONTRAST TECHNIQUE:  Multidetector CT imaging of the abdomen and pelvis was performed using the standard protocol following bolus administration of intravenous contrast. CONTRAST:  100 mL OMNIPAQUE IOHEXOL 300 MG/ML  SOLN COMPARISON:  None. FINDINGS: The lung bases are clear.  No pleural or pericardial effusion. A few tiny cysts are identified in the liver. The liver is otherwise unremarkable. The gallbladder has been removed. The spleen, adrenal glands pancreas appear normal. Two tiny low attenuating lesions in the right kidney cannot be definitively characterized but are likely cysts. The left kidney is unremarkable. The patient is status post hysterectomy. Adnexa are unremarkable. The stomach, small and large bowel and appendix appear normal. Major vascular structures are unremarkable. There is no lymphadenopathy. A small amount of fluid is seen in the anterior right lower quadrant and right hemipelvis. No lymphadenopathy is identified. The patient has a mildly comminuted right sacral fracture. Transverse process fractures on the left at L2, L3 and L4 are identified. The right pubic bone is slightly elevated compared to the left but the pubic symphysis is not widened. The sacroiliac joints are unremarkable. IMPRESSION: Right sacral fracture and left transverse process fractures L2-L4. Small volume of free pelvic fluid could be due to physiologic change or a mild venous bleed. No injury to the solid visceral is identified. Electronically Signed   By: Inge Rise M.D.   On: 07/28/2015 15:17   Dg Pelvis Portable  07/28/2015  CLINICAL DATA:  Fall off horse, severe pelvic pain EXAM: PORTABLE PELVIS 1-2 VIEWS COMPARISON:  None. FINDINGS: Diastases of the pubic symphysis. No generally, there is associated disruption of an SI joint or a sacral fracture. In this case, the right SI joint may be slightly widened, although this is equivocal. IMPRESSION: Diastases of the pubic symphysis. Possible associated widening of the right SI  joint. Electronically Signed   By: Julian Hy M.D.   On: 07/28/2015 13:36   Ct L-spine No Charge  07/28/2015  CLINICAL DATA:  Injury sustained while horseback riding today. A horse fell on top of the patient. Back pain. Initial encounter. EXAM: CT LUMBAR SPINE WITHOUT CONTRAST TECHNIQUE: Multidetector CT imaging of the lumbar spine was performed without intravenous contrast administration. Multiplanar CT image reconstructions were also generated. COMPARISON:  None. FINDINGS: The patient has transverse process fractures on the left at L2, L3 and L4. Vertebral body height and alignment are normal. The facet joints are intact and unremarkable in appearance. Right sacral fracture is noted. Shallow disc bulge is seen at L4-5 but the central canal and foramina appear open at all imaged levels. IMPRESSION: Left transverse process fractures L2-L4 and right sacral fracture. Electronically Signed   By: Inge Rise M.D.   On: 07/28/2015 15:23   Dg Chest Port 1 View  07/28/2015  CLINICAL DATA:  Fall off horse EXAM: PORTABLE CHEST 1 VIEW COMPARISON:  None. FINDINGS: Lungs are clear.  No pleural effusion or pneumothorax. The heart is normal in size. IMPRESSION: No evidence of acute cardiopulmonary disease. Electronically Signed   By: Julian Hy M.D.   On: 07/28/2015 13:57    Review of Systems  Constitutional: Negative for fever and weight loss.  HENT: Negative for ear discharge.   Eyes: Negative for blurred vision and photophobia.  Respiratory: Negative for cough.   Cardiovascular: Negative for  chest pain.  Neurological: Negative for dizziness, tremors and headaches.  Psychiatric/Behavioral: Negative for hallucinations and memory loss.   Blood pressure 106/64, pulse 92, temperature 98.4 F (36.9 C), temperature source Oral, resp. rate 17, height _0  (1.676 m), weight 79.379 kg (175 lb), SpO2 96 %. Physical Exam  Constitutional: She is oriented to person, place, and time. She appears  well-developed and well-nourished.  HENT:  Head: Normocephalic.  Eyes: Pupils are equal, round, and reactive to light.  Neck: Normal range of motion.  Cardiovascular: Normal rate.   Respiratory: Effort normal.  GI: Soft. There is tenderness.  Tender over the symphysis pubis anteriorly.  Genitourinary:  Tender over the syndesmosis, patient palpated perineal region and states she has normal sensation  Musculoskeletal:  Anterior tib EHL gastrocsoleus is intact mild tenderness distal tibia. Lateral ankle ligament and medial deltoid ligament of the right ankle is nontender no swelling no ecchymosis. Upper extremity show full range of motion no limitation of wrist range of motion no swelling no deformity skin is intact normal pulses  Neurological: She is alert and oriented to person, place, and time.  Skin: Skin is warm and dry.    Assessment/Plan: Right sacral fracture with pubic symphysis ligament disruption. I called Dr. Marcelino Scot and discussed the case with him. He will review x-rays. He requested up place the patient nothing by mouth after midnight. I discussed with the patient the potential for surgery on Monday. She understands and can discuss it with him. Lumbar spine transverse process fractures will not require specific treatment. My phone [phone]  Marybelle Killings 07/29/2015, 9:40 AM

## 2015-07-29 NOTE — Progress Notes (Signed)
Utilization Review Completed.Jodi [name] T10/16/2016  

## 2015-07-29 NOTE — Progress Notes (Signed)
Patient states she has asthma and uses several routine and rescue medications. Currently w/o complaints or concerns - asymptomatic. Call pharmacy, requested a tech visit to obtain an accurate Rx history apophylactically.

## 2015-07-29 NOTE — Progress Notes (Signed)
Patient c/o mild diffuse itching, "all over, especially my back, legs and arms." Without evidence rash or raised areas. States, "I think it is probably from the pain medicine. It's happened before." Telephone contact with Dr. Sheliah HatchKinsinger - order received for Benadryl 25 mg PO Q 4 hours PRN as needed for itching.

## 2015-07-29 NOTE — Progress Notes (Signed)
Trauma Service Note  Subjective: No new pains or symptoms  Objective: Vital signs in last 24 hours: Temp:  [97.7 F (36.5 C)-98.4 F (36.9 C)] 98.4 F (36.9 C) (10/16 0456) Pulse Rate:  [85-116] 92 (10/16 0456) Resp:  [13-20] 17 (10/16 0456) BP: (106-134)/(64-90) 106/64 mmHg (10/16 0456) SpO2:  [96 %-100 %] 96 % (10/16 0456) Last BM Date: 07/28/15  Intake/Output from previous day: 10/15 0701 - 10/16 0700 In: 60 [I.V.:60] Out: 600 [Urine:600] Intake/Output this shift: Total I/O In: 240 [P.O.:240] Out: 500 [Urine:500]  General: nad  Lungs: ctab  Abd: soft, ND, NT  Extremities: 2+ b/l DPs, 5/5 f/e b/l LE strength  Neuro: AOx4, +sensations  Lab Results: CBC   Recent Labs  07/28/15 1240 07/29/15 0447  WBC 7.3 7.9  HGB 11.4* 10.3*  HCT 33.2* 30.6*  PLT 293 273   BMET  Recent Labs  07/28/15 1240 07/29/15 0447  NA 136 136  K 3.7 3.5  CL 105 100*  CO2 24 27  GLUCOSE 118* 116*  BUN 7 6  CREATININE 0.92 0.84  CALCIUM 8.9 8.8*   PT/INR No results for input(s): LABPROT, INR in the last 72 hours. ABG No results for input(s): PHART, HCO3 in the last 72 hours.  Invalid input(s): PCO2, PO2  Studies/Results: Dg Ankle Complete Right  07/28/2015  CLINICAL DATA:  Pt fell from horse today. Right ankle pain. Could not move right leg from other injuries. EXAM: RIGHT ANKLE - COMPLETE 3+ VIEW COMPARISON:  None. FINDINGS: Lateral view suboptimal secondary to cross table technique. There is an os ossific fragment distal to the lateral malleolus which is felt to be corticated, likely related to remote trauma. Otherwise, no fracture is seen. There is apparent irregularity about the lateral midfoot on the suboptimal lateral view. This could be projectional. The posterior aspect of the fibula also appears irregular on the attempted lateral view, indeterminate. IMPRESSION: Suboptimal lateral view secondary to patient positioning and cross-table technique. Given this factor, no  definite acute osseous finding. Probable remote lateral malleolar fracture. Apparent osseous irregularity about the lateral midfoot and posterior fibula favored to be projectional. Depending on clinical symptoms, consider repeat radiographs when patient is able to be placed in a standard lateral position. Electronically Signed   By: Jeronimo GreavesKyle  Talbot M.D.   On: 07/28/2015 15:26   Ct Abdomen Pelvis W Contrast  07/28/2015  CLINICAL DATA:  Injury sustained while horseback riding today when a horse fell on top of the patient. Initial encounter. EXAM: CT ABDOMEN AND PELVIS WITH CONTRAST TECHNIQUE: Multidetector CT imaging of the abdomen and pelvis was performed using the standard protocol following bolus administration of intravenous contrast. CONTRAST:  100 mL OMNIPAQUE IOHEXOL 300 MG/ML  SOLN COMPARISON:  None. FINDINGS: The lung bases are clear.  No pleural or pericardial effusion. A few tiny cysts are identified in the liver. The liver is otherwise unremarkable. The gallbladder has been removed. The spleen, adrenal glands pancreas appear normal. Two tiny low attenuating lesions in the right kidney cannot be definitively characterized but are likely cysts. The left kidney is unremarkable. The patient is status post hysterectomy. Adnexa are unremarkable. The stomach, small and large bowel and appendix appear normal. Major vascular structures are unremarkable. There is no lymphadenopathy. A small amount of fluid is seen in the anterior right lower quadrant and right hemipelvis. No lymphadenopathy is identified. The patient has a mildly comminuted right sacral fracture. Transverse process fractures on the left at L2, L3 and L4 are identified. The  right pubic bone is slightly elevated compared to the left but the pubic symphysis is not widened. The sacroiliac joints are unremarkable. IMPRESSION: Right sacral fracture and left transverse process fractures L2-L4. Small volume of free pelvic fluid could be due to physiologic  change or a mild venous bleed. No injury to the solid visceral is identified. Electronically Signed   By: Drusilla Kanner M.D.   On: 07/28/2015 15:17   Ct L-spine No Charge  07/28/2015  CLINICAL DATA:  Injury sustained while horseback riding today. A horse fell on top of the patient. Back pain. Initial encounter. EXAM: CT LUMBAR SPINE WITHOUT CONTRAST TECHNIQUE: Multidetector CT imaging of the lumbar spine was performed without intravenous contrast administration. Multiplanar CT image reconstructions were also generated. COMPARISON:  None. FINDINGS: The patient has transverse process fractures on the left at L2, L3 and L4. Vertebral body height and alignment are normal. The facet joints are intact and unremarkable in appearance. Right sacral fracture is noted. Shallow disc bulge is seen at L4-5 but the central canal and foramina appear open at all imaged levels. IMPRESSION: Left transverse process fractures L2-L4 and right sacral fracture. Electronically Signed   By: Drusilla Kanner M.D.   On: 07/28/2015 15:23    Anti-infectives: Anti-infectives    None      Medications Scheduled Meds: . docusate sodium  100 mg Oral BID  . montelukast  10 mg Oral QHS  . pantoprazole  40 mg Oral Daily   Continuous Infusions: . dextrose 5 % and 0.45 % NaCl with KCl 20 mEq/L 50 mL/hr at 07/28/15 1803   PRN Meds:.acetaminophen, HYDROmorphone (DILAUDID) injection, ipratropium-albuterol, ondansetron **OR** ondansetron (ZOFRAN) IV, oxyCODONE, oxyCODONE  Assessment/Plan: Horse accident, pelvic fx, Lumbar TP fxs -continue bed rest -NPO after midnight -restart home PPI -Handy to assess and possible operation tomorrow   LOS: 1 day   De Blanch Marthella [place] Trauma Surgeon [phone] Surgery 07/29/2015

## 2015-07-30 ENCOUNTER — Inpatient Hospital Stay (HOSPITAL_COMMUNITY): Payer: 59 | Admitting: Certified Registered Nurse Anesthetist

## 2015-07-30 ENCOUNTER — Encounter (HOSPITAL_COMMUNITY): Payer: Self-pay | Admitting: Certified Registered Nurse Anesthetist

## 2015-07-30 ENCOUNTER — Inpatient Hospital Stay (HOSPITAL_COMMUNITY): Payer: 59

## 2015-07-30 ENCOUNTER — Encounter (HOSPITAL_COMMUNITY): Admission: EM | Disposition: A | Discharge status: Home Health | Payer: Self-pay | Source: Home / Self Care

## 2015-07-30 DIAGNOSIS — S93409A Sprain of unspecified ligament of unspecified ankle, initial encounter: Secondary | ICD-10-CM | POA: Diagnosis present

## 2015-07-30 DIAGNOSIS — S32009A Unspecified fracture of unspecified lumbar vertebra, initial encounter for closed fracture: Secondary | ICD-10-CM | POA: Diagnosis present

## 2015-07-30 DIAGNOSIS — D62 Acute posthemorrhagic anemia: Secondary | ICD-10-CM | POA: Diagnosis not present

## 2015-07-30 HISTORY — PX: ORIF PELVIC FRACTURE: SHX2128

## 2015-07-30 HISTORY — PX: SACRO-ILIAC PINNING: SHX5050

## 2015-07-30 LAB — SURGICAL PCR SCREEN
MRSA, PCR: NEGATIVE
STAPHYLOCOCCUS AUREUS: NEGATIVE

## 2015-07-30 SURGERY — PINNING, SACROILIAC JOINT, PERCUTANEOUS
Anesthesia: General | Laterality: Right

## 2015-07-30 MED ORDER — MONTELUKAST SODIUM 10 MG PO TABS
10.0000 mg | ORAL_TABLET | Freq: Every day | ORAL | Status: DC
  Administered 2015-07-30 – 2015-08-07 (×9): 10 mg via ORAL
  Filled 2015-07-30 (×9): qty 1

## 2015-07-30 MED ORDER — MIDAZOLAM HCL 5 MG/5ML IJ SOLN
INTRAMUSCULAR | Status: DC | PRN
  Administered 2015-07-30: 2 mg via INTRAVENOUS

## 2015-07-30 MED ORDER — PROPOFOL 10 MG/ML IV BOLUS
INTRAVENOUS | Status: AC
  Filled 2015-07-30: qty 20

## 2015-07-30 MED ORDER — METHOCARBAMOL 500 MG PO TABS
500.0000 mg | ORAL_TABLET | Freq: Four times a day (QID) | ORAL | Status: DC | PRN
  Administered 2015-07-31 – 2015-08-03 (×8): 1000 mg via ORAL
  Filled 2015-07-30 (×9): qty 2

## 2015-07-30 MED ORDER — SUCCINYLCHOLINE CHLORIDE 20 MG/ML IJ SOLN
INTRAMUSCULAR | Status: AC
  Filled 2015-07-30: qty 2

## 2015-07-30 MED ORDER — GLYCOPYRROLATE 0.2 MG/ML IJ SOLN
INTRAMUSCULAR | Status: AC
  Filled 2015-07-30: qty 1

## 2015-07-30 MED ORDER — LACTATED RINGERS IV SOLN
INTRAVENOUS | Status: DC | PRN
  Administered 2015-07-30 (×2): via INTRAVENOUS

## 2015-07-30 MED ORDER — ONDANSETRON HCL 4 MG/2ML IJ SOLN: 4.0000 mg | Freq: Once | INTRAMUSCULAR | Status: DC | PRN

## 2015-07-30 MED ORDER — POLYETHYLENE GLYCOL 3350 17 G PO PACK
17.0000 g | PACK | Freq: Every day | ORAL | Status: DC
  Administered 2015-07-31 – 2015-08-06 (×4): 17 g via ORAL
  Filled 2015-07-30 (×6): qty 1

## 2015-07-30 MED ORDER — IPRATROPIUM-ALBUTEROL 0.5-2.5 (3) MG/3ML IN SOLN
3.0000 mL | RESPIRATORY_TRACT | Status: DC | PRN
Start: 2015-07-30 — End: 2015-08-07

## 2015-07-30 MED ORDER — FLUTICASONE PROPIONATE 50 MCG/ACT NA SUSP
1.0000 | Freq: Every day | NASAL | Status: DC
  Administered 2015-07-31 – 2015-08-07 (×6): 1 via NASAL
  Filled 2015-07-30 (×2): qty 16

## 2015-07-30 MED ORDER — LIDOCAINE HCL (CARDIAC) 20 MG/ML IV SOLN
INTRAVENOUS | Status: AC
  Filled 2015-07-30: qty 5

## 2015-07-30 MED ORDER — ONDANSETRON HCL 4 MG/2ML IJ SOLN
INTRAMUSCULAR | Status: AC
  Filled 2015-07-30: qty 2

## 2015-07-30 MED ORDER — OXYCODONE HCL 5 MG PO TABS: 5.0000 mg | ORAL_TABLET | Freq: Once | ORAL | Status: AC | PRN

## 2015-07-30 MED ORDER — MIDAZOLAM HCL 2 MG/2ML IJ SOLN
INTRAMUSCULAR | Status: AC
  Filled 2015-07-30: qty 4

## 2015-07-30 MED ORDER — CETIRIZINE HCL 10 MG PO TABS
10.0000 mg | ORAL_TABLET | Freq: Every evening | ORAL | Status: DC
  Administered 2015-08-01 – 2015-08-06 (×6): 10 mg via ORAL
  Filled 2015-07-30 (×9): qty 1

## 2015-07-30 MED ORDER — HYDROMORPHONE HCL 1 MG/ML IJ SOLN
INTRAMUSCULAR | Status: AC
  Filled 2015-07-30: qty 1

## 2015-07-30 MED ORDER — PROPOFOL 10 MG/ML IV BOLUS
INTRAVENOUS | Status: DC | PRN
  Administered 2015-07-30: 200 mg via INTRAVENOUS

## 2015-07-30 MED ORDER — ENOXAPARIN SODIUM 40 MG/0.4ML ~~LOC~~ SOLN: 40.0000 mg | SUBCUTANEOUS | Status: DC

## 2015-07-30 MED ORDER — HYDROMORPHONE HCL 1 MG/ML IJ SOLN
0.2500 mg | INTRAMUSCULAR | Status: DC | PRN
  Administered 2015-07-30 (×4): 0.5 mg via INTRAVENOUS

## 2015-07-30 MED ORDER — FENTANYL CITRATE (PF) 250 MCG/5ML IJ SOLN
INTRAMUSCULAR | Status: AC
  Filled 2015-07-30: qty 5

## 2015-07-30 MED ORDER — GLYCOPYRROLATE 0.2 MG/ML IJ SOLN
INTRAMUSCULAR | Status: DC | PRN
  Administered 2015-07-30: 0.2 mg via INTRAVENOUS

## 2015-07-30 MED ORDER — OXYCODONE HCL 5 MG/5ML PO SOLN
ORAL | Status: AC
  Filled 2015-07-30: qty 5

## 2015-07-30 MED ORDER — DESIPRAMINE HCL 50 MG PO TABS
50.0000 mg | ORAL_TABLET | Freq: Every day | ORAL | Status: DC
  Administered 2015-07-31 – 2015-08-06 (×7): 50 mg via ORAL
  Filled 2015-07-30 (×9): qty 1

## 2015-07-30 MED ORDER — OXYCODONE HCL 5 MG/5ML PO SOLN
5.0000 mg | Freq: Once | ORAL | Status: AC | PRN
  Administered 2015-07-30: 5 mg via ORAL

## 2015-07-30 MED ORDER — OXYCODONE HCL 5 MG PO TABS
5.0000 mg | ORAL_TABLET | ORAL | Status: DC | PRN
  Administered 2015-07-30 – 2015-07-31 (×3): 15 mg via ORAL
  Filled 2015-07-30 (×3): qty 3

## 2015-07-30 MED ORDER — NALOXONE HCL 0.4 MG/ML IJ SOLN
INTRAMUSCULAR | Status: AC
  Filled 2015-07-30: qty 1

## 2015-07-30 MED ORDER — HYDROMORPHONE HCL 1 MG/ML IJ SOLN
0.2500 mg | INTRAMUSCULAR | Status: AC | PRN
  Administered 2015-07-30 (×4): 0.5 mg via INTRAVENOUS

## 2015-07-30 MED ORDER — CEFAZOLIN SODIUM-DEXTROSE 2-3 GM-% IV SOLR
INTRAVENOUS | Status: DC | PRN
  Administered 2015-07-30: 2 g via INTRAVENOUS

## 2015-07-30 MED ORDER — FENTANYL CITRATE (PF) 100 MCG/2ML IJ SOLN
INTRAMUSCULAR | Status: DC | PRN
  Administered 2015-07-30: 50 ug via INTRAVENOUS
  Administered 2015-07-30 (×2): 100 ug via INTRAVENOUS
  Administered 2015-07-30: 50 ug via INTRAVENOUS
  Administered 2015-07-30 (×2): 100 ug via INTRAVENOUS

## 2015-07-30 MED ORDER — METHOCARBAMOL 500 MG PO TABS: 1000.0000 mg | ORAL_TABLET | Freq: Four times a day (QID) | ORAL | Status: DC | PRN

## 2015-07-30 MED ORDER — SUCCINYLCHOLINE CHLORIDE 20 MG/ML IJ SOLN
INTRAMUSCULAR | Status: DC | PRN
Start: 2015-07-30 — End: 2015-07-30
  Administered 2015-07-30: 120 mg via INTRAVENOUS

## 2015-07-30 MED ORDER — FAMOTIDINE 20 MG PO TABS
20.0000 mg | ORAL_TABLET | Freq: Every day | ORAL | Status: DC
  Administered 2015-07-30 – 2015-08-07 (×9): 20 mg via ORAL
  Filled 2015-07-30 (×9): qty 1

## 2015-07-30 MED ORDER — ALBUTEROL SULFATE (2.5 MG/3ML) 0.083% IN NEBU: 2.5000 mg | INHALATION_SOLUTION | Freq: Four times a day (QID) | RESPIRATORY_TRACT | Status: DC | PRN

## 2015-07-30 MED ORDER — DEXAMETHASONE SODIUM PHOSPHATE 4 MG/ML IJ SOLN
INTRAMUSCULAR | Status: DC | PRN
  Administered 2015-07-30: 4 mg via INTRAVENOUS

## 2015-07-30 MED ORDER — FLUTICASONE FUROATE-VILANTEROL 100-25 MCG/INH IN AEPB: 1.0000 | INHALATION_SPRAY | Freq: Every day | RESPIRATORY_TRACT | Status: DC

## 2015-07-30 MED ORDER — ONDANSETRON HCL 4 MG/2ML IJ SOLN
INTRAMUSCULAR | Status: DC | PRN
  Administered 2015-07-30: 4 mg via INTRAVENOUS

## 2015-07-30 MED ORDER — ARFORMOTEROL TARTRATE 15 MCG/2ML IN NEBU: 15.0000 ug | INHALATION_SOLUTION | Freq: Two times a day (BID) | RESPIRATORY_TRACT | Status: DC | PRN

## 2015-07-30 MED ORDER — OXYCODONE-ACETAMINOPHEN 5-325 MG PO TABS: 1.0000 | ORAL_TABLET | Freq: Four times a day (QID) | ORAL | Status: DC | PRN

## 2015-07-30 MED ORDER — HYDROMORPHONE HCL 1 MG/ML IJ SOLN
0.5000 mg | INTRAMUSCULAR | Status: DC | PRN
  Administered 2015-07-30 – 2015-08-06 (×13): 0.5 mg via INTRAVENOUS
  Filled 2015-07-30 (×15): qty 1

## 2015-07-30 MED ORDER — LACTATED RINGERS IV SOLN
INTRAVENOUS | Status: DC
  Administered 2015-07-30: 16:00:00 via INTRAVENOUS

## 2015-07-30 SURGICAL SUPPLY — 63 items
BIT DRILL AO MATTA 2.5MX230M (BIT) IMPLANT
BLADE SURG 15 STRL LF DISP TIS (BLADE) ×2 IMPLANT
BLADE SURG 15 STRL SS (BLADE) ×3
BLADE SURG ROTATE 9660 (MISCELLANEOUS) IMPLANT
BRUSH SCRUB DISP (MISCELLANEOUS) ×6 IMPLANT
COVER SURGICAL LIGHT HANDLE (MISCELLANEOUS) ×6 IMPLANT
DRAIN CHANNEL 15F RND FF W/TCR (WOUND CARE) ×3 IMPLANT
DRAPE C-ARM 42X72 X-RAY (DRAPES) ×3 IMPLANT
DRAPE C-ARMOR (DRAPES) ×3 IMPLANT
DRAPE IMP U-DRAPE 54X76 (DRAPES) ×3 IMPLANT
DRAPE INCISE IOBAN 66X45 STRL (DRAPES) ×6 IMPLANT
DRAPE LAPAROTOMY TRNSV 102X78 (DRAPE) ×6 IMPLANT
DRAPE U-SHAPE 47X51 STRL (DRAPES) ×6 IMPLANT
DRILL BIT AO MATTA 2.5MX230M (BIT) ×3
DRSG MEPILEX BORDER 4X4 (GAUZE/BANDAGES/DRESSINGS) ×1 IMPLANT
DRSG MEPILEX BORDER 4X8 (GAUZE/BANDAGES/DRESSINGS) ×1 IMPLANT
ELECT BLADE 4.0 EZ CLEAN MEGAD (MISCELLANEOUS) ×3
ELECT REM PT RETURN 9FT ADLT (ELECTROSURGICAL) ×3
ELECTRODE BLDE 4.0 EZ CLN MEGD (MISCELLANEOUS) IMPLANT
ELECTRODE REM PT RTRN 9FT ADLT (ELECTROSURGICAL) ×2 IMPLANT
EVACUATOR SILICONE 100CC (DRAIN) ×3 IMPLANT
GLOVE BIO SURGEON STRL SZ7.5 (GLOVE) ×3 IMPLANT
GLOVE BIO SURGEON STRL SZ8 (GLOVE) ×3 IMPLANT
GLOVE BIOGEL PI IND STRL 7.5 (GLOVE) ×2 IMPLANT
GLOVE BIOGEL PI IND STRL 8 (GLOVE) ×2 IMPLANT
GLOVE BIOGEL PI INDICATOR 7.5 (GLOVE) ×1
GLOVE BIOGEL PI INDICATOR 8 (GLOVE) ×1
GOWN STRL REUS W/ TWL LRG LVL3 (GOWN DISPOSABLE) ×4 IMPLANT
GOWN STRL REUS W/ TWL XL LVL3 (GOWN DISPOSABLE) ×2 IMPLANT
GOWN STRL REUS W/TWL LRG LVL3 (GOWN DISPOSABLE) ×6
GOWN STRL REUS W/TWL XL LVL3 (GOWN DISPOSABLE) ×3
KIT BASIN OR (CUSTOM PROCEDURE TRAY) ×3 IMPLANT
KIT ROOM TURNOVER OR (KITS) ×3 IMPLANT
MANIFOLD NEPTUNE II (INSTRUMENTS) ×3 IMPLANT
NS IRRIG 1000ML POUR BTL (IV SOLUTION) ×6 IMPLANT
PACK GENERAL/GYN (CUSTOM PROCEDURE TRAY) ×3 IMPLANT
PACK TOTAL JOINT (CUSTOM PROCEDURE TRAY) ×3 IMPLANT
PACK UNIVERSAL I (CUSTOM PROCEDURE TRAY) ×3 IMPLANT
PAD ARMBOARD 7.5X6 YLW CONV (MISCELLANEOUS) ×6 IMPLANT
PLATE SYMPHYSIS 92.5M 6H (Plate) ×1 IMPLANT
SCREW CORT 2.5XFT 44X3.5XST (Screw) IMPLANT
SCREW CORTEX ST MATTA 3.5X18MM (Screw) ×3 IMPLANT
SCREW CORTEX ST MATTA 3.5X34MM (Screw) ×1 IMPLANT
SCREW CORTEX ST MATTA 3.5X45MM (Screw) ×1 IMPLANT
SCREW CORTICAL 3.5X44MM (Screw) ×3 IMPLANT
SPONGE LAP 18X18 X RAY DECT (DISPOSABLE) IMPLANT
STAPLER VISISTAT 35W (STAPLE) ×3 IMPLANT
SUCTION FRAZIER TIP 10 FR DISP (SUCTIONS) ×3 IMPLANT
SUT ETHILON 3 0 FSL (SUTURE) ×1 IMPLANT
SUT ETHILON 3 0 PS 1 (SUTURE) ×3 IMPLANT
SUT VIC AB 0 CT1 27 (SUTURE) ×6
SUT VIC AB 0 CT1 27XBRD ANBCTR (SUTURE) ×8 IMPLANT
SUT VIC AB 1 CT1 18XCR BRD 8 (SUTURE) ×4 IMPLANT
SUT VIC AB 1 CT1 8-18 (SUTURE) ×3
SUT VIC AB 2-0 CT1 27 (SUTURE) ×3
SUT VIC AB 2-0 CT1 TAPERPNT 27 (SUTURE) ×8 IMPLANT
SUT VIC AB 2-0 FS1 27 (SUTURE) ×3 IMPLANT
TOWEL OR 17X24 6PK STRL BLUE (TOWEL DISPOSABLE) ×3 IMPLANT
TOWEL OR 17X26 10 PK STRL BLUE (TOWEL DISPOSABLE) ×6 IMPLANT
TRAY FOLEY CATH 16FRSI W/METER (SET/KITS/TRAYS/PACK) ×1 IMPLANT
UNDERPAD 30X30 INCONTINENT (UNDERPADS AND DIAPERS) ×3 IMPLANT
WASHER OIC 13MM 6 PACK (Screw) ×1 IMPLANT
WATER STERILE IRR 1000ML POUR (IV SOLUTION) ×12 IMPLANT

## 2015-07-30 NOTE — Anesthesia Preprocedure Evaluation (Addendum)
Anesthesia Evaluation  Patient identified by MRN, date of birth, ID band Patient awake    Reviewed: Allergy & Precautions, NPO status , Patient's Chart, lab work & pertinent test results  Airway Mallampati: II  TM Distance: >3 FB Neck ROM: Full    Dental  (+) Teeth Intact, Dental Advisory Given   Pulmonary asthma , former smoker,    breath sounds clear to auscultation       Cardiovascular negative cardio ROS   Rhythm:Regular Rate:Normal     Neuro/Psych negative neurological ROS     GI/Hepatic negative GI ROS, Neg liver ROS,   Endo/Other  negative endocrine ROS  Renal/GU negative Renal ROS     Musculoskeletal   Abdominal   Peds  Hematology  (+) anemia ,   Anesthesia Other Findings   Reproductive/Obstetrics                           Anesthesia Physical Anesthesia Plan  ASA: II  Anesthesia Plan: General   Post-op Pain Management:    Induction: Intravenous  Airway Management Planned: Oral ETT  Additional Equipment:   Intra-op Plan:   Post-operative Plan: Extubation in OR  Informed Consent: I have reviewed the patients History and Physical, chart, labs and discussed the procedure including the risks, benefits and alternatives for the proposed anesthesia with the patient or authorized representative who has indicated his/her understanding and acceptance.   Dental advisory given  Plan Discussed with: CRNA  Anesthesia Plan Comments:         Anesthesia Quick Evaluation

## 2015-07-30 NOTE — Brief Op Note (Signed)
07/28/2015 - 07/30/2015  8:46 PM  PATIENT:  Jodi [name]  39 y.o. female  PRE-OPERATIVE DIAGNOSIS:  PELVIC RING DISRUPTION, ANTERIOR AND POSTERIOR WITH RIGHT L5 SACRAL FRACTURE  POST-OPERATIVE DIAGNOSIS:  PELVIC RING DISRUPTION, ANTERIOR AND POSTERIOR WITH RIGHT L5 SACRAL FRACTURE  PROCEDURE:  Procedure(s): 1. SACRO-ILIAC PINNING (Right) 2. OPEN REDUCTION INTERNAL FIXATION (ORIF) ANTERIOR PUBIC SYMPHYSIS   SURGEON:  Surgeon(s) and Role:    * Myrene GalasMichael Aprile Dickenson, MD - Primary  PHYSICIAN ASSISTANT: 1. Montez MoritaKeith Paul, PA-C, 2. PA Student  ANESTHESIA:   general  I/O:  Total I/O In: 1000 [I.V.:1000] Out: 330 [Urine:330]  SPECIMEN:  No Specimen  TOURNIQUET:  * No tourniquets in log *  DICTATION: .Other Dictation: Dictation Number [id number]

## 2015-07-30 NOTE — Progress Notes (Signed)
Report called to RN in Short Stay

## 2015-07-30 NOTE — Anesthesia Procedure Notes (Signed)
Procedure Name: Intubation Date/Time: 07/30/2015 6:27 PM Performed by: Sarita HaverFLOWERS, Jodi [name]: Patient identified, Timeout performed, Emergency Drugs available, Suction available and Patient being monitored Patient Re-evaluated:Patient Re-evaluated prior to inductionOxygen Delivery Method: Circle system utilized and Simple face mask Preoxygenation: Pre-oxygenation with 100% oxygen Intubation Type: IV induction Ventilation: Mask ventilation without difficulty Laryngoscope Size: Mac and 3 Grade View: Grade II Tube type: Oral Tube size: 7.5 mm Number of attempts: 1 Airway Equipment and Method: Patient positioned with wedge pillow and Stylet Placement Confirmation: ETT inserted through vocal cords under direct vision,  positive ETCO2 and breath sounds checked- equal and bilateral Secured at: 22 cm Tube secured with: Tape Dental Injury: Teeth and Oropharynx as per pre-operative assessment

## 2015-07-30 NOTE — Anesthesia Postprocedure Evaluation (Signed)
Anesthesia Post Note  Patient: Jodi FellersJaime M Mcfaul  Procedure(s) Performed: Procedure(s) (LRB): SACRO-ILIAC PINNING (Right) OPEN REDUCTION INTERNAL FIXATION (ORIF) PELVIC FRACTURE (N/A)  Anesthesia type: general  Patient location: PACU  Post pain: Pain level controlled  Post assessment: Patient's Cardiovascular Status Stable  Last Vitals:  Filed Vitals:   07/30/15 2200  BP: 153/94  Pulse: 106  Temp:   Resp: 14    Post vital signs: Reviewed and stable  Level of consciousness: sedated  Complications: No apparent anesthesia complications

## 2015-07-30 NOTE — Transfer of Care (Signed)
Immediate Anesthesia Transfer of Care Note  Patient: Jodi [name]  Procedure(s) Performed: Procedure(s): SACRO-ILIAC PINNING (Right) OPEN REDUCTION INTERNAL FIXATION (ORIF) PELVIC FRACTURE (N/A)  Patient Location: PACU  Anesthesia Type:General  Level of Consciousness: oriented, sedated, patient cooperative and responds to stimulation  Airway & Oxygen Therapy: Patient Spontanous Breathing and Patient connected to nasal cannula oxygen  Post-op Assessment: Report given to RN, Post -op Vital signs reviewed and stable and Patient moving all extremities X 4  Post vital signs: Reviewed and stable  Last Vitals:  Filed Vitals:   07/30/15 1430  BP: 129/52  Pulse: 89  Temp: 36.7 C  Resp: 18    Complications: No apparent anesthesia complications

## 2015-07-30 NOTE — Progress Notes (Signed)
Patient ID: Jodi [name], female   DOB: 01/18/1976, 39 y.o.   MRN: [id number]   LOS: 2 days   Subjective: Doing well. Eager to have surgery and get on with therapies. Denies N/V.   Objective: Vital signs in last 24 hours: Temp:  [98.3 F (36.8 C)-98.7 F (37.1 C)] 98.5 F (36.9 C) (10/17 0433) Pulse Rate:  [90-105] 105 (10/17 0433) Resp:  [17-19] 19 (10/17 0433) BP: (108-130)/(62-73) 108/62 mmHg (10/17 0433) SpO2:  [100 %] 100 % (10/17 0433) Last BM Date: 07/28/15   Physical Exam General appearance: alert and no distress Resp: clear to auscultation bilaterally Cardio: regular rate and rhythm GI: normal findings: bowel sounds normal and soft, non-tender Extremities: NVI   Assessment/Plan: Fall from horse and subsequently sat on by horse Pelvic ring fracture including symphysis disruption and right sacral FX -- OR today by [facility] Transverse process fractures L2-4 Right ankle sprain ABL anemia -- Check tomorrow Multiple medical problems -- Home meds FEN -- No issues VTE -- SCD's, Lovenox Dispo -- OR    Freeman CaldronMichael J. Azia Toutant, PA-C Pager: [phone] General Trauma PA Pager: [phone]  07/30/2015

## 2015-07-30 NOTE — Progress Notes (Signed)
Orthopaedic Trauma Service (OTS)  Reason for Consult: Pelvic ring fracture s/p horse riding accident Referring Physician:  Olga Millers, MD (ortho)   HPI: Jodi [name] is an 39 y.o. black female who was injured while riding her horse on 07/28/2015. Pt was riding some trails in [place] when her horse reared back on her. She fell off and the horse landed on top of her. Pt had immediate onset of pain. Pt brought to [facility] for workup.  She was found to have a pelvic ring fracture as well as some Left sided TVP lumbar fxs. Pt seen and evaluated by Dr. Lorin Mercy of orthopaedics but due to the complexity of the fracture consult was requested from the orthopaedic trauma service. Pt has been on Bed rest since admission. She was also found to have an avulsion fx off the tip of her R distal fibula as well.   Pt reports significant pain with even minor movements in bed and finding it hard to get comfortable. She also reports some tingling in her big toe as well but no noticeable weakness.    Pt works at [employer], stands 8-12 hours per shift  She has hx notable for severe asthma but has not had and acute attack in several months (approx 3-4)  Does not smoke or do other drugs   Past Medical History  Diagnosis Date  . Asthma   . Back pain   . Sickle-cell trait [facility])     Past Surgical History  Procedure Laterality Date  . Cholecystectomy    . Abdominal hysterectomy      No family history on file.  Social History:  reports that she has quit smoking. She does not have any smokeless tobacco history on file. She reports that she does not drink alcohol or use illicit drugs.  Allergies:  Allergies  Allergen Reactions  . Compazine [Prochlorperazine]     Can not control facial muscles    Medications:  I have reviewed the patient's current medications. Prior to Admission:  Prescriptions prior to admission  Medication Sig Dispense Refill Last Dose  . albuterol (PROVENTIL  HFA;VENTOLIN HFA) 108 (90 BASE) MCG/ACT inhaler Inhale 1-2 puffs into the lungs every 6 (six) hours as needed for wheezing or shortness of breath.   over 30 days  . arformoterol (BROVANA) 15 MCG/2ML NEBU Take 15 mcg by nebulization 2 (two) times daily as needed (shortness of breath).   over 30 days  . celecoxib (CELEBREX) 200 MG capsule Take 200 mg by mouth daily.   07/28/2015 at Unknown time  . desipramine (NORPRAMIN) 50 MG tablet Take 50 mg by mouth at bedtime.   07/27/2015  . fluticasone (FLONASE) 50 MCG/ACT nasal spray Place 1 spray into both nostrils daily.   07/28/2015 at Unknown time  . Fluticasone Furoate-Vilanterol (BREO ELLIPTA) 100-25 MCG/INH AEPB Inhale 1 puff into the lungs daily.   07/28/2015 at Unknown time  . ipratropium-albuterol (DUONEB) 0.5-2.5 (3) MG/3ML SOLN Take 3 mLs by nebulization every 4 (four) hours as needed (shortness of breath).   over 30 days  . levocetirizine (XYZAL) 5 MG tablet Take 5 mg by mouth every evening.   07/27/2015  . montelukast (SINGULAIR) 10 MG tablet Take 10 mg by mouth daily.   07/28/2015 at Unknown time  . omeprazole (PRILOSEC) 20 MG capsule Take 20 mg by mouth 2 (two) times daily before a meal.   07/28/2015 at Unknown time  . ranitidine (ZANTAC) 150 MG tablet Take 150 mg by mouth daily.  07/28/2015 at Unknown time   Scheduled: . desipramine  50 mg Oral QHS  . docusate sodium  100 mg Oral BID  . [START ON 07/31/2015] enoxaparin (LOVENOX) injection  40 mg Subcutaneous Q24H  . famotidine  20 mg Oral Daily  . fluticasone  1 spray Each Nare Daily  . Fluticasone Furoate-Vilanterol  1 puff Inhalation Daily  . levocetirizine  5 mg Oral QPM  . montelukast  10 mg Oral Daily  . pantoprazole  40 mg Oral Daily  . polyethylene glycol  17 g Oral Daily    Results for orders placed or performed during the hospital encounter of 07/28/15 (from the past 48 hour(s))  Comprehensive metabolic panel     Status: Abnormal   Collection Time: 07/28/15 12:40 PM   Result Value Ref Range   Sodium 136 135 - 145 mmol/L   Potassium 3.7 3.5 - 5.1 mmol/L   Chloride 105 101 - 111 mmol/L   CO2 24 22 - 32 mmol/L   Glucose, Bld 118 (H) 65 - 99 mg/dL   BUN 7 6 - 20 mg/dL   Creatinine, Ser 0.92 0.44 - 1.00 mg/dL   Calcium 8.9 8.9 - 10.3 mg/dL   Total Protein 6.2 (L) 6.5 - 8.1 g/dL   Albumin 3.6 3.5 - 5.0 g/dL   AST 26 15 - 41 U/L   ALT 16 14 - 54 U/L   Alkaline Phosphatase 74 38 - 126 U/L   Total Bilirubin 0.6 0.3 - 1.2 mg/dL   GFR calc non Af Amer >60 >60 mL/min   GFR calc Af Amer >60 >60 mL/min    Comment: (NOTE) The eGFR has been calculated using the CKD EPI equation. This calculation has not been validated in all clinical situations. eGFR's persistently <60 mL/min signify possible Chronic Kidney Disease.    Anion gap 7 5 - 15  CBC with Differential     Status: Abnormal   Collection Time: 07/28/15 12:40 PM  Result Value Ref Range   WBC 7.3 4.0 - 10.5 K/uL   RBC 3.77 (L) 3.87 - 5.11 MIL/uL   Hemoglobin 11.4 (L) 12.0 - 15.0 g/dL   HCT 33.2 (L) 36.0 - 46.0 %   MCV 88.1 78.0 - 100.0 fL   MCH 30.2 26.0 - 34.0 pg   MCHC 34.3 30.0 - 36.0 g/dL   RDW 13.2 11.5 - 15.5 %   Platelets 293 150 - 400 K/uL   Neutrophils Relative % 65 %   Neutro Abs 4.8 1.7 - 7.7 K/uL   Lymphocytes Relative 27 %   Lymphs Abs 2.0 0.7 - 4.0 K/uL   Monocytes Relative 7 %   Monocytes Absolute 0.5 0.1 - 1.0 K/uL   Eosinophils Relative 1 %   Eosinophils Absolute 0.1 0.0 - 0.7 K/uL   Basophils Relative 0 %   Basophils Absolute 0.0 0.0 - 0.1 K/uL  I-Stat Beta hCG blood, ED ([facility], [facility], [facility] only)     Status: None   Collection Time: 07/28/15 12:58 PM  Result Value Ref Range   I-stat hCG, quantitative <5.0 <5 mIU/mL   Comment 3            Comment:   GEST. AGE      CONC.  (mIU/mL)   <=1 WEEK        5 - 50     2 WEEKS       50 - 500     3 WEEKS       100 - 10,000  4 WEEKS     1,000 - 30,000        FEMALE AND NON-PREGNANT FEMALE:     LESS THAN 5 mIU/mL   CBC     Status:  Abnormal   Collection Time: 07/29/15  4:47 AM  Result Value Ref Range   WBC 7.9 4.0 - 10.5 K/uL   RBC 3.44 (L) 3.87 - 5.11 MIL/uL   Hemoglobin 10.3 (L) 12.0 - 15.0 g/dL   HCT 30.6 (L) 36.0 - 46.0 %   MCV 89.0 78.0 - 100.0 fL   MCH 29.9 26.0 - 34.0 pg   MCHC 33.7 30.0 - 36.0 g/dL   RDW 13.4 11.5 - 15.5 %   Platelets 273 150 - 400 K/uL  Basic metabolic panel     Status: Abnormal   Collection Time: 07/29/15  4:47 AM  Result Value Ref Range   Sodium 136 135 - 145 mmol/L   Potassium 3.5 3.5 - 5.1 mmol/L   Chloride 100 (L) 101 - 111 mmol/L   CO2 27 22 - 32 mmol/L   Glucose, Bld 116 (H) 65 - 99 mg/dL   BUN 6 6 - 20 mg/dL   Creatinine, Ser 0.84 0.44 - 1.00 mg/dL   Calcium 8.8 (L) 8.9 - 10.3 mg/dL   GFR calc non Af Amer >60 >60 mL/min   GFR calc Af Amer >60 >60 mL/min    Comment: (NOTE) The eGFR has been calculated using the CKD EPI equation. This calculation has not been validated in all clinical situations. eGFR's persistently <60 mL/min signify possible Chronic Kidney Disease.    Anion gap 9 5 - 15    Dg Ankle Complete Right  07/28/2015  CLINICAL DATA:  Pt fell from horse today. Right ankle pain. Could not move right leg from other injuries. EXAM: RIGHT ANKLE - COMPLETE 3+ VIEW COMPARISON:  None. FINDINGS: Lateral view suboptimal secondary to cross table technique. There is an os ossific fragment distal to the lateral malleolus which is felt to be corticated, likely related to remote trauma. Otherwise, no fracture is seen. There is apparent irregularity about the lateral midfoot on the suboptimal lateral view. This could be projectional. The posterior aspect of the fibula also appears irregular on the attempted lateral view, indeterminate. IMPRESSION: Suboptimal lateral view secondary to patient positioning and cross-table technique. Given this factor, no definite acute osseous finding. Probable remote lateral malleolar fracture. Apparent osseous irregularity about the lateral midfoot  and posterior fibula favored to be projectional. Depending on clinical symptoms, consider repeat radiographs when patient is able to be placed in a standard lateral position. Electronically Signed   By: Abigail Miyamoto M.D.   On: 07/28/2015 15:26   Ct Abdomen Pelvis W Contrast  07/28/2015  CLINICAL DATA:  Injury sustained while horseback riding today when a horse fell on top of the patient. Initial encounter. EXAM: CT ABDOMEN AND PELVIS WITH CONTRAST TECHNIQUE: Multidetector CT imaging of the abdomen and pelvis was performed using the standard protocol following bolus administration of intravenous contrast. CONTRAST:  100 mL OMNIPAQUE IOHEXOL 300 MG/ML  SOLN COMPARISON:  None. FINDINGS: The lung bases are clear.  No pleural or pericardial effusion. A few tiny cysts are identified in the liver. The liver is otherwise unremarkable. The gallbladder has been removed. The spleen, adrenal glands pancreas appear normal. Two tiny low attenuating lesions in the right kidney cannot be definitively characterized but are likely cysts. The left kidney is unremarkable. The patient is status post hysterectomy. Adnexa are unremarkable. The  stomach, small and large bowel and appendix appear normal. Major vascular structures are unremarkable. There is no lymphadenopathy. A small amount of fluid is seen in the anterior right lower quadrant and right hemipelvis. No lymphadenopathy is identified. The patient has a mildly comminuted right sacral fracture. Transverse process fractures on the left at L2, L3 and L4 are identified. The right pubic bone is slightly elevated compared to the left but the pubic symphysis is not widened. The sacroiliac joints are unremarkable. IMPRESSION: Right sacral fracture and left transverse process fractures L2-L4. Small volume of free pelvic fluid could be due to physiologic change or a mild venous bleed. No injury to the solid visceral is identified. Electronically Signed   By: Inge Rise M.D.    On: 07/28/2015 15:17   Dg Pelvis Portable  07/28/2015  CLINICAL DATA:  Fall off horse, severe pelvic pain EXAM: PORTABLE PELVIS 1-2 VIEWS COMPARISON:  None. FINDINGS: Diastases of the pubic symphysis. No generally, there is associated disruption of an SI joint or a sacral fracture. In this case, the right SI joint may be slightly widened, although this is equivocal. IMPRESSION: Diastases of the pubic symphysis. Possible associated widening of the right SI joint. Electronically Signed   By: Julian Hy M.D.   On: 07/28/2015 13:36   Dg Pelvis Comp Min 3v  07/30/2015  CLINICAL DATA:  Preoperative chest radiograph for pelvic fracture. Initial encounter. EXAM: JUDET PELVIS - 3+ VIEW COMPARISON:  None. FINDINGS: The patient's right sacral fracture is partially characterized on radiograph. Fractures through the left transverse processes of L2 through L4 are not well seen. The femoral heads remain seated within their respective acetabula. There is slight misalignment at the pubic symphysis, thought to reflect minimal surrounding ligamentous injury. The visualized bowel gas pattern is grossly unremarkable. IMPRESSION: Right sacral fracture is partially characterized on radiograph. Fractures through the left transverse processes of L2 through L4 are not well seen. Slight misalignment at the pubic symphysis is thought to reflect minimal surrounding ligamentous injury. Electronically Signed   By: Garald Balding M.D.   On: 07/30/2015 02:07   Ct L-spine No Charge  07/28/2015  CLINICAL DATA:  Injury sustained while horseback riding today. A horse fell on top of the patient. Back pain. Initial encounter. EXAM: CT LUMBAR SPINE WITHOUT CONTRAST TECHNIQUE: Multidetector CT imaging of the lumbar spine was performed without intravenous contrast administration. Multiplanar CT image reconstructions were also generated. COMPARISON:  None. FINDINGS: The patient has transverse process fractures on the left at L2, L3 and L4.  Vertebral body height and alignment are normal. The facet joints are intact and unremarkable in appearance. Right sacral fracture is noted. Shallow disc bulge is seen at L4-5 but the central canal and foramina appear open at all imaged levels. IMPRESSION: Left transverse process fractures L2-L4 and right sacral fracture. Electronically Signed   By: Inge Rise M.D.   On: 07/28/2015 15:23   Dg Chest Port 1 View  07/28/2015  CLINICAL DATA:  Fall off horse EXAM: PORTABLE CHEST 1 VIEW COMPARISON:  None. FINDINGS: Lungs are clear.  No pleural effusion or pneumothorax. The heart is normal in size. IMPRESSION: No evidence of acute cardiopulmonary disease. Electronically Signed   By: Julian Hy M.D.   On: 07/28/2015 13:57    Review of Systems  Constitutional: Negative for fever.  Respiratory: Negative for shortness of breath and wheezing.   Cardiovascular: Negative for chest pain and palpitations.  Gastrointestinal: Negative for abdominal pain.  Musculoskeletal:       +  low back pain  + right ankle pain   Skin: Positive for itching.  Neurological: Positive for tingling (+ tingling R great toe ). Negative for headaches.   Blood pressure 108/62, pulse 105, temperature 98.5 F (36.9 C), temperature source Oral, resp. rate 19, height 5' 6"  (1.676 m), weight 79.379 kg (175 lb), SpO2 100 %. Physical Exam  Constitutional: She is oriented to person, place, and time. Vital signs are normal. She appears well-developed and well-nourished. She is cooperative. No distress.  Comfortable appearing female, appropriate for stated age   Cardiovascular: Normal rate, regular rhythm, S1 normal and S2 normal.   Respiratory:  Clear anterior fields   GI:  Soft, NTND, + BS  Musculoskeletal:  Pelvis    No open wounds   No lesions or appreciable ecchymosis    Exquisite tenderness with gentle manipulation of R hemipelvis with IR   Tenderness along her symphysis as well   L hemipelvis nontender     Discomfort with palpation of soft tissue on her Left hip/flank. Do not appreciate degloving type injury   Right Lower Extremity  Inspection:   No gross deformities   No open wounds, abrasions   No significant swelling   Legs appear to be in neutral rotation  Bony eval:   Mild discomfort with axial loading of R leg    Thigh, knee, lower leg nontender   No crepitus with eval   Ankle tender laterally    No medial tenderness   Foot is nontender  Soft tissue:   Knee stable with exam- varus and valgus stress    Ankle stable with exam- anterior draw    ROM:   Full active ankle ROM    Knee and hip ROM not assessed   Sensation:   SPN,TN sensation intact    Mild dec sensation along DPN distribution as well as great toe     Femoral nerve sensation intat  Motor:   EHL, FHL, AT, PT, peroneals, gastroc motor intact   + Quad set Vascular:    + DP pulse     No DCT     Compartments soft   Left Lower Extremity  Inspection:   No open wounds   No lesions Bony eval:   Hip nontender   No pain with axial load or log roll    Knee and ankle nontender Soft tissue:   No acute soft tissue findings   Knee and ankle stable ROM:   Full ankle ROM noted Sensation:   DPN, SPN, TN sensation intact  Motor:   EHL, FHL, AT, PT, peroneals, gastroc motor intact Vascular:   + DP pulse    Ext warm    No DCT     Compartments soft   B upper extremities   Unremarkable    Full active ROM noted   Neurological: She is alert and oriented to person, place, and time.  Psychiatric: She has a normal mood and affect. Her behavior is normal. Cognition and memory are normal.    Assessment/Plan:  39 y/o female s/p horse riding accident  1. Horse riding accident  2. LC 1-2 pelvic ring fracture with pain at rest and L5 nerve root irritation  OR for SI screw +/- anterior plate  Possible for later today vs tomorrow  Post op will likely allow WBAT on Left and PWB on R   No ROM restrictions post op    3. FEN  Npo for now   4. DVT/PE prophylaxis  lovenox post op  Consider daily ASA after 3 week course of lovenox if mobilizing well o/w coumadin x 8 weeks  5. R ankle distal fibula avulsion fx  Non-op   +/- air cast if pt would like o/w no bracing necessary   6. Dispo  Possible OR later today for fixation of pelvic ring fracture   Jari Pigg, PA-C [facility] [phone] (P) 07/30/2015, 10:45 AM

## 2015-07-30 NOTE — Progress Notes (Signed)
Sign out request made to Dr Krista BlueSinger

## 2015-07-31 ENCOUNTER — Encounter (HOSPITAL_COMMUNITY): Payer: Self-pay | Admitting: Orthopedic Surgery

## 2015-07-31 ENCOUNTER — Inpatient Hospital Stay (HOSPITAL_COMMUNITY): Payer: 59

## 2015-07-31 LAB — CBC
HEMATOCRIT: 32 % — AB (ref 36.0–46.0)
HEMOGLOBIN: 10.9 g/dL — AB (ref 12.0–15.0)
MCH: 30.5 pg (ref 26.0–34.0)
MCHC: 34.1 g/dL (ref 30.0–36.0)
MCV: 89.6 fL (ref 78.0–100.0)
Platelets: 291 10*3/uL (ref 150–400)
RBC: 3.57 MIL/uL — AB (ref 3.87–5.11)
RDW: 13.1 % (ref 11.5–15.5)
WBC: 8.9 10*3/uL (ref 4.0–10.5)

## 2015-07-31 MED ORDER — OXYCODONE HCL 5 MG PO TABS
5.0000 mg | ORAL_TABLET | Freq: Once | ORAL | Status: AC
  Administered 2015-07-31: 5 mg via ORAL
  Filled 2015-07-31: qty 1

## 2015-07-31 MED ORDER — ENOXAPARIN SODIUM 40 MG/0.4ML ~~LOC~~ SOLN
40.0000 mg | SUBCUTANEOUS | Status: DC
  Administered 2015-07-31 – 2015-08-07 (×8): 40 mg via SUBCUTANEOUS
  Filled 2015-07-31 (×7): qty 0.4

## 2015-07-31 MED ORDER — OXYCODONE HCL 5 MG PO TABS
10.0000 mg | ORAL_TABLET | ORAL | Status: DC | PRN
  Administered 2015-07-31 (×2): 15 mg via ORAL
  Administered 2015-08-01: 10 mg via ORAL
  Administered 2015-08-01 – 2015-08-03 (×9): 20 mg via ORAL
  Filled 2015-07-31 (×2): qty 4
  Filled 2015-07-31: qty 3
  Filled 2015-07-31 (×6): qty 4
  Filled 2015-07-31: qty 2
  Filled 2015-07-31: qty 4
  Filled 2015-07-31: qty 3

## 2015-07-31 MED ORDER — HYDROMORPHONE HCL 1 MG/ML IJ SOLN
1.0000 mg | Freq: Once | INTRAMUSCULAR | Status: AC
  Administered 2015-07-31: 1 mg via INTRAVENOUS
  Filled 2015-07-31: qty 1

## 2015-07-31 NOTE — Evaluation (Signed)
Physical Therapy Evaluation Patient Details Name: Jodi [name] MRN: [id number] DOB: 03/03/1976 Today's Date: 07/31/2015   History of Present Illness  39 yo female with onset of Crush injury by horse with the pelvic fracture (ORIF), closed; right sacral fracture and AP compression pattern; Lt L-2, L-3, L-4 transverse process fractures; Rt ankle sprain  Clinical Impression  Pt was seen for continued mobility and has been able to transition to chair with written and verbal information about precautions and instructions.  Pt is motivated to walk and will have some family/friend help sporadically at home.  Will need to take her on stairs to see if she can maintain PWB on RLE with the activity.    Follow Up Recommendations Home health PT;Supervision/Assistance - 24 hour    Equipment Recommendations  Rolling walker with 5" wheels;3in1 (PT)    Recommendations for Other Services       Precautions / Restrictions Precautions Precautions: Fall;Back Precaution Booklet Issued: Yes (comment) Precaution Comments: PWB on RLE due to no clear allocation of her condition Restrictions Weight Bearing Restrictions: Yes Other Position/Activity Restrictions: PWB RLE      Mobility  Bed Mobility Overal bed mobility: Needs Assistance Bed Mobility: Supine to Sit     Supine to sit: Mod assist (assisted LE's and minimally under trunk)     General bed mobility comments: cued back precautions, rolling  Transfers Overall transfer level: Needs assistance Equipment used: Rolling walker (2 wheeled);1 person hand held assist Transfers: Sit to/from UGI CorporationStand;Stand Pivot Transfers Sit to Stand: Mod assist;Min assist;+2 safety/equipment Stand pivot transfers: Min assist;+2 safety/equipment       General transfer comment: assisted to move with help due to multiple injuries, PWB RLE and back precautions  Ambulation/Gait Ambulation/Gait assistance: Min assist;+2 safety/equipment Ambulation Distance  (Feet): 110 Feet Assistive device: Rolling walker (2 wheeled);1 person hand held assist (second person for chair and to maneuver obstacles for PT) Gait Pattern/deviations: Step-to pattern;Decreased weight shift to right;Trunk flexed;Wide base of support Gait velocity: reduced Gait velocity interpretation: Below normal speed for age/gender General Gait Details: reminders for WBing and pacing, posture  Stairs            Wheelchair Mobility    Modified Rankin (Stroke Patients Only)       Balance Overall balance assessment: Needs assistance Sitting-balance support: Feet supported Sitting balance-Leahy Scale: Fair     Standing balance support: Bilateral upper extremity supported Standing balance-Leahy Scale: Poor                               Pertinent Vitals/Pain Pain Assessment: 0-10 Pain Score: 8  Pain Location: pelvis and back    Home Living Family/patient expects to be discharged to:: Private residence Living Arrangements: Alone Available Help at Discharge: Family;Friend(s);Available PRN/intermittently Type of Home: House Home Access: Stairs to enter Entrance Stairs-Rails: Right;Left;Can reach both Entrance Stairs-Number of Steps: 5 Home Layout: One level Home Equipment: None      Prior Function Level of Independence: Independent               Hand Dominance        Extremity/Trunk Assessment   Upper Extremity Assessment: Overall WFL for tasks assessed           Lower Extremity Assessment: RLE deficits/detail RLE Deficits / Details: R ankle sprain and pelvic fracture (remote R lateral malleolar fracture)    Cervical / Trunk Assessment: Other exceptions (L2-4 L side transverse process fractures,  R L5-S1 fracture)  Communication   Communication: No difficulties  Cognition Arousal/Alertness: Awake/alert Behavior During Therapy: WFL for tasks assessed/performed Overall Cognitive Status: Within Functional Limits for tasks  assessed                      General Comments General comments (skin integrity, edema, etc.): Pt has new fractures in spine and pelvis, will need to monitor for pain management with mobility and WB restriction for steps to allow her to go home.    Exercises        Assessment/Plan    PT Assessment Patient needs continued PT services  PT Diagnosis Difficulty walking;Acute pain   PT Problem List Decreased strength;Decreased range of motion;Decreased activity tolerance;Decreased balance;Decreased mobility;Decreased coordination;Decreased knowledge of use of DME;Decreased safety awareness;Pain;Decreased skin integrity;Decreased knowledge of precautions  PT Treatment Interventions DME instruction;Gait training;Stair training;Functional mobility training;Therapeutic activities;Therapeutic exercise;Balance training;Neuromuscular re-education;Patient/family education   PT Goals (Current goals can be found in the Care Plan section) Acute Rehab PT Goals Patient Stated Goal: to be safe PT Goal Formulation: With patient Time For Goal Achievement: 08/14/15 Potential to Achieve Goals: Good    Frequency Min 5X/week   Barriers to discharge Inaccessible home environment;Decreased caregiver support home alone and has stairs to enter    Co-evaluation               End of Session Equipment Utilized During Treatment: Gait belt (under axillae) Activity Tolerance: Patient tolerated treatment well;Patient limited by fatigue;Patient limited by pain Patient left: in chair;with call bell/phone within reach;with family/visitor present Nurse Communication: Mobility status;Weight bearing status         Time: 1610-9604 PT Time Calculation (min) (ACUTE ONLY): 30 min   Charges:   PT Evaluation $Initial PT Evaluation Tier I: 1 Procedure PT Treatments $Gait Training: 8-22 mins   PT G CodesIvar Drape 2015/08/09, 4:34 PM   Samul Dada, PT MS Acute Rehab Dept. Number:  [facility] [id number] and [facility] [phone]

## 2015-07-31 NOTE — Progress Notes (Signed)
Orthopaedic Trauma Service Progress Note  Subjective  Doing well No specific complaints  Pain improved  Still with mild dec sensation in R great toe   Review of Systems  Constitutional: Negative for fever and chills.  Respiratory: Negative for shortness of breath.   Cardiovascular: Negative for chest pain and palpitations.  Gastrointestinal: Negative for nausea, vomiting and abdominal pain.  Neurological: Negative for headaches.     Objective   BP 121/83 mmHg  Pulse 107  Temp(Src) 98.4 F (36.9 C) (Oral)  Resp 16  Ht 5\' 6"  (1.676 m)  Wt 84.7 kg (186 lb 11.7 oz)  BMI 30.15 kg/m2  SpO2 100%  Intake/Output      10/17 0701 - 10/18 0700 10/18 0701 - 10/19 0700   P.O.     I.V. (mL/kg) 1000 (11.8)    Total Intake(mL/kg) 1000 (11.8)    Urine (mL/kg/hr) 330 (0.2)    Total Output 330     Net +670          Urine Occurrence 1 x      Labs  Results for Lenord FellersWILKERSON, Danilynn M (MRN [id number]) as of 07/31/2015 08:31  Ref. Range 07/31/2015 04:34  WBC Latest Ref Range: 4.0-10.5 K/uL 8.9  RBC Latest Ref Range: 3.87-5.11 MIL/uL 3.57 (L)  Hemoglobin Latest Ref Range: 12.0-15.0 g/dL 78.210.9 (L)  HCT Latest Ref Range: 36.0-46.0 % 32.0 (L)  MCV Latest Ref Range: 78.0-100.0 fL 89.6  MCH Latest Ref Range: 26.0-34.0 pg 30.5  MCHC Latest Ref Range: 30.0-36.0 g/dL 95.634.1  RDW Latest Ref Range: 11.5-15.5 % 13.1  Platelets Latest Ref Range: 150-400 K/uL 291    Exam  Gen: awake and alert, NAD, comfortable Lungs: clear anterior fields Cardiac: RRR, S1 and S2 Abd: NTND, + BS Pelvis: dressings stable  Ext:       Right Lower Extremity   Dec sensation along DPN  SPN and TN sensation intact  EHL intact  FHL and lesser toe motor functions intact  Active ankle flex, ext, inv, evr noted  No swelling  Compartments soft  + DP pulse    Assessment and Plan   POD/HD#: 1   39 y/o female s/p horse riding accident  1. Horse riding accident  2. LC 2 pelvic ring fracture and L5 nerve root  irritation s/p ORIF symphysis and R SI screw  WBAT L leg  PWB R leg (50%)  No ROM restrictions  PT/OT evals  Ice prn  Dressing changes tomorrow               3. FEN             advance diet               4. DVT/PE prophylaxis             lovenox x3 weeks              ASA 325 BID after Lovenox course   5. R ankle distal fibula avulsion fx             Non-op               will add air cast if pt has discomfort while working with therapies   6. Dispo  PT/OT evals today   Possible dc home tomorrow  Ok to shower today with assist, do not remove dressings      Mearl LatinKeith W. Mariangela Heldt, PA-C [facility] [phone](P) [phone] (O) 07/31/2015 8:30 AM

## 2015-07-31 NOTE — Op Note (Signed)
NAME:  Jodi [name], Jodi [name] NO.:  [id number]  MEDICAL RECORD NO.:  [id number]  LOCATION:  [id number]                        FACILITY:  [facility]  PHYSICIAN:  Doralee Albino. Carola Frost, M.D. DATE OF BIRTH:  [birth year]  DATE OF PROCEDURE:  07/30/2015 DATE OF DISCHARGE:                              OPERATIVE REPORT   PREOPERATIVE DIAGNOSIS:  Pelvic ring disruption, anterior and posterior with right L5 sacral fracture.  POSTOPERATIVE DIAGNOSIS:  Pelvic ring disruption, anterior and posterior with right L5 sacral fracture.  PROCEDURE: 1. Iliosacral screw fixation right hemipelvis. 2. Open reduction and internal fixation of anterior pubic symphysis.  SURGEON:  Doralee Albino. Carola Frost, M.D.  ASSISTANT: 1. [name], PA-C. 2. PA student.  ANESTHESIA:  General.  COMPLICATIONS:  None.  I/O:  1000 mL of crystalloid, out 330 mL of UOP.  SPECIMENS:  None.  DISPOSITION:  PACU.  CONDITION:  Stable.  BRIEF SUMMARY AND INDICATION FOR PROCEDURE:  Jodi [name] is a very pleasant, 39 year old female who sustained a horse riding accident during which she had disruption of both the pubic symphysis and the right hemipelvis with a sacral fracture resulting in L5 radiculopathy including decreased sensation and motor weakness of the great toe.  Given the location and complexity of the pelvic ring injury, Dr. Ophelia Charter asserted this was outside his scope of practice and that it would be in the best interest of the patient to have these injuries evaluated and treated by a fellowship trained orthopaedic traumatologist. Consequently, I was consulted to provide further evaluation and management. I did discuss with her the risks and benefits of surgical repair including the possibility of exacerbation of her nerve injury, DVT, PE, heart attack, stroke, symptomatic hardware, need for further surgery, and urologic injury among others.  She acknowledged these risks and did wish to proceed.  BRIEF SUMMARY OF  PROCEDURE:  The patient was given preoperative antibiotics, taken to the operating room and general anesthesia was induced.  A chlorhexidine scrub was performed of her abdomen and pelvic area.  Standard Betadine prep and drape followed, then time-out held. Pfannenstiel incision was made through the old hysterectomy incision. Dissection was carried down the pelvic brim where the muscles and rectus sheath were released at their insertion to the pelvic brim.  The pubic disk was found to be disrupted with posterior displacement of the right hemipelvis and instability such that use of the ball spike pusher to create distal and posterior force could be used to reduce the displacement.  I carefully elevated the pelvic brim, maintain as much soft tissue attachment as possible.  I placed a 2 screws into the free right hemipelvis and then performed a reduction maneuver, which was held by my assistant for placement of a screw into the contralateral pelvic brim and then another in the extreme hole more lateral to help hold the pelvis in its reduced position.  Once this was accomplished to place the additional screws and C-arm confirmed appropriate placement of all hardware, trajectory and length as well as reduction of the symphysis. Attention was then turned to the posterior right hemipelvis.  A lateral projection was obtained of S1 and achieving the start point slightly posterior to make sure that would  limit the possibility of further anterior displacement of the comminuted segment of the sacral ala.  A guide pin was advanced into the center of the S1 vertebral body, confirmed on the lateral inlet and outlet projections.  An 85 mm, 7.3 cannulated screw and washer were then placed with compression being obtained and again the trajectory was slightly caudal and anterior to help counteract the forces, which had produced the displacement.  All were once again checked on the fluoroscopic views and then  wound was irrigated thoroughly, closed in standard layered fashion using #1 figure- of-eight for the rectus insertion, 0 Vicryl for the deep subcu, 2-0 Vicryl and nylon for the skin.  The patient was then awakened from anesthesia and transported to PACU in stable condition.  We did use a malleable retractor to protect the bladder at all times.  Montez MoritaKeith Paul, PA- C assisted me throughout, was necessary for the safe and effective completion of the case.  PROGNOSIS:  The patient will be partial weightbearing on the right lower extremity for the next several weeks and then progressed subsequently. We anticipate a 1-2 days stay in the hospital.  Should be on formal pharmacologic DVT prophylaxis and we will plan to see her back for removal of sutures and wound check in the office in 2 weeks.  We will reassess her L5 motor weakness and are hopeful that this will resolve with correction of the displacement and stabilization of her fracture.     Doralee AlbinoMichael H. Carola FrostHandy, M.D.     MHH/MEDQ  D:  07/30/2015  T:  07/31/2015  Job:  [id number]

## 2015-07-31 NOTE — Plan of Care (Signed)
Problem: Acute Rehab PT Goals(only PT should resolve) Goal: Pt Will Go Supine/Side To Sit With correct body mechanics Goal: Pt Will Ambulate And maintaining WBing as ordered on RLE

## 2015-07-31 NOTE — Progress Notes (Signed)
Patient ID: Jodi FellersJaime M [name], female   DOB: 07/29/1976, 39 y.o.   MRN: [id number]   LOS: 3 days   Subjective: Doing well, denies N/V, pain controlled.   Objective: Vital signs in last 24 hours: Temp:  [97.8 F (36.6 C)-98.6 F (37 C)] 98.4 F (36.9 C) (10/18 0413) Pulse Rate:  [89-127] 107 (10/18 0413) Resp:  [10-18] 16 (10/18 0413) BP: (121-159)/(52-97) 121/83 mmHg (10/18 0413) SpO2:  [100 %] 100 % (10/18 0413) Weight:  [84.7 kg (186 lb 11.7 oz)] 84.7 kg (186 lb 11.7 oz) (10/17 2250) Last BM Date: 07/28/15   Laboratory  CBC  Recent Labs  07/29/15 0447 07/31/15 0434  WBC 7.9 8.9  HGB 10.3* 10.9*  HCT 30.6* 32.0*  PLT 273 291    Physical Exam General appearance: alert and no distress Resp: clear to auscultation bilaterally Cardio: regular rate and rhythm GI: normal findings: bowel sounds normal and soft, non-tender Extremities: Warm   Assessment/Plan: Fall from horse and subsequently sat on by horse Pelvic ring fracture including symphysis disruption and right sacral FX s/p ORIF -- per Dr. Carola FrostHandy Transverse process fractures L2-4 Right ankle sprain ABL anemia -- Stable Multiple medical problems -- Home meds FEN -- No issues VTE -- SCD's, Lovenox Dispo -- PT/OT    Freeman CaldronMichael J. Croix Presley, PA-C Pager: [phone] General Trauma PA Pager: [phone]  07/31/2015

## 2015-07-31 NOTE — Clinical Social Work Note (Signed)
Clinical Social Work Assessment  Patient Details  Name: Jodi [name] MRN: [id number] Date of Birth: 24-Nov-1975  Date of referral:  07/31/15               Reason for consult:  Trauma                Permission sought to share information with:  Family Supports Permission granted to share information::  Yes, Verbal Permission Granted  Name::     Jodi [name]  Relationship::  Mother  Contact Information:  [phone]  Housing/Transportation Living arrangements for the past 2 months:  Single Family Home Source of Information:  Patient Patient Interpreter Needed:  None Criminal Activity/Legal Involvement Pertinent to Current Situation/Hospitalization:  No - Comment as needed Significant Relationships:  Significant Other, Friend, Parents Lives with:  Self, Spouse Do you feel safe going back to the place where you live?  Yes Need for family participation in patient care:  Yes (Comment)  Care giving concerns:  No family/friends at bedside, however no immediate concerns or concerns addressed by patient.   Social Worker assessment / plan:  Holiday representative met with patient at bedside to offer support and discuss patient needs at discharge.  Patient states that she was trail riding with her horse Kharma when she got thrown off and then stepped on.  Patient was with another friend who was able to prevent the horse from creating more injury.  Patient is working at 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  Patient is very passionate about the horses and is hopeful to transition the hobby into full time work.  Patient states that her boyfriend plans to stay with her at discharge and will provide necessary assistance as needed.  Clinical Social Worker inquired about current substance use.  Patient states that she has not had a drink of alcohol since she was 38 years old.  SBIRT completed.  No concerns addressed at this time.  No resources needed.  Clinical Social Worker will  sign off for now as social work intervention is no longer needed. Please consult [place] again if new need arises.  Employment status:  Therapist, music:  Managed Care PT Recommendations:  Home with [place] / Referral to community resources:  SBIRT  Patient/Family's Response to care:  Patient verbalized understanding of CSW role and appreciation for support and concern.  Patient states that she has had excellent care while being hospitalized and is excited to get back home.  Patient/Family's Understanding of and Emotional Response to Diagnosis, Current Treatment, and Prognosis:  Patient understanding of injuries and realistic regarding recovery time.  Emotional Assessment Appearance:  Appears stated age, Well-Groomed Attitude/Demeanor/Rapport:   (Appropriate, Engaged, Cooperative) Affect (typically observed):  Accepting, Calm Orientation:  Oriented to Self, Oriented to Situation, Oriented to Place, Oriented to  Time Alcohol / Substance use:  Never Used Psych involvement (Current and /or in the community):  No (Comment)  Discharge Needs  Concerns to be addressed:  No discharge needs identified Readmission within the last 30 days:  No Current discharge risk:  None Barriers to Discharge:  Continued Medical Work up  [employer], [place]

## 2015-08-01 ENCOUNTER — Inpatient Hospital Stay (HOSPITAL_COMMUNITY): Payer: 59

## 2015-08-01 LAB — CBC
HEMATOCRIT: 28.7 % — AB (ref 36.0–46.0)
Hemoglobin: 9.6 g/dL — ABNORMAL LOW (ref 12.0–15.0)
MCH: 30.1 pg (ref 26.0–34.0)
MCHC: 33.4 g/dL (ref 30.0–36.0)
MCV: 90 fL (ref 78.0–100.0)
PLATELETS: 310 10*3/uL (ref 150–400)
RBC: 3.19 MIL/uL — ABNORMAL LOW (ref 3.87–5.11)
RDW: 13.2 % (ref 11.5–15.5)
WBC: 8.3 10*3/uL (ref 4.0–10.5)

## 2015-08-01 MED ORDER — METOCLOPRAMIDE HCL 5 MG/ML IJ SOLN
10.0000 mg | Freq: Three times a day (TID) | INTRAMUSCULAR | Status: DC
  Administered 2015-08-01 – 2015-08-03 (×5): 10 mg via INTRAVENOUS
  Filled 2015-08-01 (×6): qty 2

## 2015-08-01 NOTE — Progress Notes (Signed)
Occupational Therapy Evaluation Patient Details Name: [name] MRN: [id number] DOB: 05/02/1976 Today's Date: 08/01/2015    History of Present Illness 39 yo female with onset of Crush injury by horse with the pelvic fracture (ORIF), closed; right sacral fracture and AP compression pattern; Lt L-2, L-3, L-4 transverse process fractures; Rt ankle sprain   Clinical Impression   PTA, pt independent with ADL and mobility. Pt limited by pain this am after taking shower and completing stairs with PT. Pt will benefit from OT to educate pt on use of compensatory techniques and AE/ DME for ADL and functional mobility for ADL. Recommend pt follow up with HHOT. Will follow.     Follow Up Recommendations  Home health OT;Supervision/Assistance - 24 hour (initially)    Equipment Recommendations  3 in 1 bedside comode    Recommendations for Other Services       Precautions / Restrictions Precautions Precautions: Fall;Back Precaution Booklet Issued: Yes (comment) Precaution Comments: PWB on RLE due to no clear allocation of her condition Restrictions Weight Bearing Restrictions: Yes RLE Weight Bearing: Partial weight bearing RLE Partial Weight Bearing Percentage or Pounds: 50 LLE Weight Bearing: Weight bearing as tolerated Other Position/Activity Restrictions: PWB RLE      Mobility Bed Mobility Overal bed mobility: Needs Assistance Bed Mobility: Sit to Supine      Sit to supine: Mod assist   Transfers Overall transfer level: Needs assistance Equipment used: Rolling walker (2 wheeled);1 person hand held assist Transfers: Sit to/from Stand Sit to Stand: Min assist Stand pivot transfers: Min assist;       General transfer comment: pt able to stand to RW with increased time and min-guard A for safety    Balance Overall balance assessment: Needs assistance Sitting-balance support: No upper extremity supported Sitting balance-Leahy Scale: Fair     Standing balance  support: Bilateral upper extremity supported Standing balance-Leahy Scale: Poor Standing balance comment: needs UE support due to pain LE's                            ADL Overall ADL's : Needs assistance/impaired     Grooming: Set up   Upper Body Bathing: Set up;Supervision/ safety;Sitting   Lower Body Bathing: Moderate assistance;Sit to/from stand   Upper Body Dressing : Set up;Supervision/safety;Sitting   Lower Body Dressing: Moderate assistance;Sit to/from stand   Toilet Transfer: Minimal assistance Statistician Details (indicate cue type and reason): mod vc to maintain WBS Toileting- Clothing Manipulation and Hygiene: Minimal assistance       Functional mobility during ADLs: Minimal assistance;Rolling walker;Cueing for sequencing General ADL Comments: mod vc to maintain proper WBS     Vision     Perception     Praxis      Pertinent Vitals/Pain Pain Assessment: 0-10 Pain Score: 9  Faces Pain Scale: Hurts whole lot Pain Location: hips Pain Descriptors / Indicators: Aching;Constant;Grimacing Pain Intervention(s): Limited activity within patient's tolerance;Monitored during session;Repositioned;Ice applied;Relaxation     Hand Dominance     Extremity/Trunk Assessment Upper Extremity Assessment Upper Extremity Assessment: Overall WFL for tasks assessed   Lower Extremity Assessment Lower Extremity Assessment: Defer to PT evaluation RLE Deficits / Details: R ankle sprain and pelvic fracture (remote R lateral malleolar fracture)   Cervical / Trunk Assessment Cervical / Trunk Assessment: Other exceptions (L2-4 L side transverse process fractures, R L5-S1 fracture)   Communication Communication Communication: No difficulties   Cognition Arousal/Alertness: Awake/alert Behavior During Therapy: [facility]  for tasks assessed/performed Overall Cognitive Status: Within Functional Limits for tasks assessed                     General Comments        Exercises       Shoulder Instructions      Home Living Family/patient expects to be discharged to:: Private residence Living Arrangements: Alone Available Help at Discharge: Family;Friend(s);Available PRN/intermittently Type of Home: House Home Access: Stairs to enter Entergy CorporationEntrance Stairs-Number of Steps: 5 Entrance Stairs-Rails: Right;Left;Can reach both Home Layout: One level     Bathroom Shower/Tub: Producer, television/film/videoWalk-in shower   Bathroom Toilet: Standard Bathroom Accessibility: No   Home Equipment: None          Prior Functioning/Environment Level of Independence: Independent             OT Diagnosis: Generalized weakness;Acute pain   OT Problem List: Decreased strength;Decreased range of motion;Decreased activity tolerance;Decreased knowledge of use of DME or AE;Decreased knowledge of precautions;Pain   OT Treatment/Interventions: Self-care/ADL training;DME and/or AE instruction;Therapeutic activities;Patient/family education;Balance training    OT Goals(Current goals can be found in the care plan section) Acute Rehab OT Goals Patient Stated Goal: to not be in pain OT Goal Formulation: With patient Time For Goal Achievement: 08/15/15 Potential to Achieve Goals: Good  OT Frequency: Min 2X/week   Barriers to D/C:            Co-evaluation              End of Session Equipment Utilized During Treatment: Engineer, waterolling walker Nurse Communication: Mobility status;Precautions;Patient requests pain meds;Weight bearing status  Activity Tolerance: Patient limited by pain Patient left: in bed;with call bell/phone within reach   Time: 1133-1158 OT Time Calculation (min): 25 min Charges:  OT General Charges $OT Visit: 1 Procedure OT Evaluation $Initial OT Evaluation Tier I: 1 Procedure OT Treatments $Self Care/Home Management : 8-22 mins G-Codes:    Jodi [name],Jodi [name] 08/01/2015, 12:19 PM   [facility] Department[facility]ary Alvira Hecht, OTR/L  [phone] 08/01/2015

## 2015-08-01 NOTE — Progress Notes (Signed)
Patient ID: Jodi [name], female   DOB: 05/27/1976, 39 y.o.   MRN: [id number]   LOS: 4 days   Subjective: Bloated with abd pain since this am. Denies flatus since yesterday. Pain controlled.   Objective: Vital signs in last 24 hours: Temp:  [98.2 F (36.8 C)-98.7 F (37.1 C)] 98.2 F (36.8 C) (10/19 0548) Pulse Rate:  [101-111] 110 (10/19 0548) Resp:  [16] 16 (10/19 0548) BP: (135-145)/(76-87) 145/87 mmHg (10/19 0548) SpO2:  [100 %] 100 % (10/19 0548) Last BM Date: 07/27/15   Laboratory  CBC  Recent Labs  07/31/15 0434 08/01/15 0355  WBC 8.9 8.3  HGB 10.9* 9.6*  HCT 32.0* 28.7*  PLT 291 310    Physical Exam General appearance: alert and no distress Resp: clear to auscultation bilaterally Cardio: regular rate and rhythm GI: Soft, +BS, mild distension, mild TTP diffuse but mostly RLQ   Assessment/Plan: Fall from horse and subsequently sat on by horse Pelvic ring fracture including symphysis disruption and right sacral FX s/p ORIF -- per Dr. Carola FrostHandy Transverse process fractures L2-4 Right ankle sprain ABL anemia -- Moderate, down somewhat Multiple medical problems -- Home meds FEN -- Likely ileus, will get abd x-ray, pt wants to hold off on NGT for now VTE -- SCD's, Lovenox Dispo -- PT/OT    Freeman CaldronMichael J. Vergia Chea, PA-C Pager: [phone] General Trauma PA Pager: [phone]  08/01/2015

## 2015-08-01 NOTE — Progress Notes (Signed)
Orthopaedic Trauma Service Progress Note  Subjective  Overall doing well Still with some back pain Belly is sore Tolerating diet  Worked well with therapy yesterday   R big toe still tingling   Review of Systems  Constitutional: Negative for fever and chills.  Eyes: Positive for double vision.  Respiratory: Negative for shortness of breath and wheezing.   Cardiovascular: Negative for chest pain and palpitations.  Gastrointestinal: Negative for nausea and vomiting.     Objective   BP 145/87 mmHg  Pulse 110  Temp(Src) 98.2 F (36.8 C) (Oral)  Resp 16  Ht  (1.676 m)  Wt 84.7 kg (186 lb 11.7 oz)  BMI 30.15 kg/m2  SpO2 100%  Intake/Output      10/18 0701 - 10/19 0700 10/19 0701 - 10/20 0700   P.O. 600    I.V. (mL/kg)     Total Intake(mL/kg) 600 (7.1)    Urine (mL/kg/hr)     Total Output       Net +600          Urine Occurrence 4 x      Labs Results for Jodi [name], FELLNER (MRN [id number]) as of 08/01/2015 10:52  Ref. Range 08/01/2015 03:55  WBC Latest Ref Range: 4.0-10.5 K/uL 8.3  RBC Latest Ref Range: 3.87-5.11 MIL/uL 3.19 (L)  Hemoglobin Latest Ref Range: 12.0-15.0 g/dL 9.6 (L)  HCT Latest Ref Range: 36.0-46.0 % 28.7 (L)  MCV Latest Ref Range: 78.0-100.0 fL 90.0  MCH Latest Ref Range: 26.0-34.0 pg 30.1  MCHC Latest Ref Range: 30.0-36.0 g/dL 40.9  RDW Latest Ref Range: 11.5-15.5 % 13.2  Platelets Latest Ref Range: 150-400 K/uL 310    Exam  Gen: resting comfortably in bed, eating breakfast Lungs: clear anterior fields  Cardiac: s1 and s2, RRR Abd: +BS Pelvis: dressing stable, scant drainage noted  Ext:       Right Lower Extremity   Right Lower Extremity               Dec sensation along DPN             SPN and TN sensation intact             EHL intact             FHL and lesser toe motor functions intact             Active ankle flex, ext, inv, evr noted             No swelling             Compartments soft             + DP  pulse   Assessment and Plan   POD/HD#: 2  39 y/o female s/p horse riding accident  1. Horse riding accident  2. LC 2 pelvic ring fracture and L5 nerve root irritation s/p ORIF symphysis and R SI screw             WBAT L leg             PWB R leg (50%)             No ROM restrictions             PT/OT evals             Ice prn             Dressing changes tomorrow  3. FEN              per TS               4. DVT/PE prophylaxis             lovenox x3 weeks               ASA 325 BID after Lovenox course   5. R ankle distal fibula avulsion fx             Non-op               will add air cast if pt has discomfort while working with therapies   6. Dispo             PT/OT             Possible dc home tomorrow             Ok to shower today with assist, do not remove dressings      Jodi LatinKeith W. Doroteo Nickolson, PA-C [facility] [phone](P) [phone] (O) 08/01/2015 8:56 AM

## 2015-08-01 NOTE — Progress Notes (Signed)
Physical Therapy Treatment Patient Details Name: Jodi [name] MRN: [id number] DOB: 1976/06/16 Today's Date: 08/01/2015    History of Present Illness 39 yo female with onset of Crush injury by horse with the pelvic fracture (ORIF), closed; right sacral fracture and AP compression pattern; Lt L-2, L-3, L-4 transverse process fractures; Rt ankle sprain    PT Comments    Pt progressing with mobility, ascended and descended stairs today. AMbulation limited by abdominal pain and bloating. PT will continue to follow.   Follow Up Recommendations  Home health PT;Supervision/Assistance - 24 hour     Equipment Recommendations  Rolling walker with 5" wheels;3in1 (PT)    Recommendations for Other Services       Precautions / Restrictions Precautions Precautions: Fall;Back Precaution Comments: reviewed WB status and precautions Restrictions Weight Bearing Restrictions: Yes RLE Weight Bearing: Partial weight bearing RLE Partial Weight Bearing Percentage or Pounds: 50 LLE Weight Bearing: Weight bearing as tolerated    Mobility  Bed Mobility Overal bed mobility: Needs Assistance Bed Mobility: Supine to Sit     Supine to sit: Mod assist     General bed mobility comments: practiced bed mobility with flat bed, did let her use bed rail today. Pt was able to roll to left and get legs off bed and into sitting but was unable to scoot to EOB without assist. Mod A to pad under pt's right hip to scoot to EOB  Transfers Overall transfer level: Needs assistance Equipment used: Rolling walker (2 wheeled) Transfers: Sit to/from Stand Sit to Stand: Min guard         General transfer comment: pt able to stand to RW with increased time and min-guard A for safety  Ambulation/Gait Ambulation/Gait assistance: Min guard Ambulation Distance (Feet): 30 Feet Assistive device: Rolling walker (2 wheeled) Gait Pattern/deviations: Step-to pattern;Decreased weight shift to right;Wide base of  support Gait velocity: decreased Gait velocity interpretation: <1.8 ft/sec, indicative of risk for recurrent falls General Gait Details: pt with bloating and pain in abdomen today which was significantly limiting mobility so did not walk as much. Right ankle pain tolerable when using RW for ambulation and pt able to maintain PWB   Stairs Stairs: Yes Stairs assistance: Min assist Stair Management: Two rails;Step to pattern;Forwards Number of Stairs: 3 General stair comments: attempted ascension facing rail and going sideways because pt cannot reach both of her rails at home but this was too painful to the right ankle. Went fwd with bilateral rails (will use HHA of boyfriend on opposite side to get in home) and led with left LE. This was painful but doable for her.   Wheelchair Mobility    Modified Rankin (Stroke Patients Only)       Balance Overall balance assessment: Needs assistance Sitting-balance support: No upper extremity supported Sitting balance-Leahy Scale: Good     Standing balance support: Bilateral upper extremity supported Standing balance-Leahy Scale: Poor Standing balance comment: needs UE support due to pain LE's                    Cognition Arousal/Alertness: Awake/alert Behavior During Therapy: WFL for tasks assessed/performed Overall Cognitive Status: Within Functional Limits for tasks assessed                      Exercises      General Comments General comments (skin integrity, edema, etc.): discussed car transfer for d/c home tomorrow. Pt concerned about abdominal bloating and that she has not had a  BM since admission. Abdominal pain seeming to increase pelvic pain as well today      Pertinent Vitals/Pain Pain Assessment: Faces Faces Pain Scale: Hurts whole lot Pain Location: left hip/ pelvis, abdomen Pain Intervention(s): Monitored during session;Limited activity within patient's tolerance    Home Living                       Prior Function            PT Goals (current goals can now be found in the care plan section) Acute Rehab PT Goals Patient Stated Goal: to be safe PT Goal Formulation: With patient Time For Goal Achievement: 08/14/15 Potential to Achieve Goals: Good Progress towards PT goals: Progressing toward goals    Frequency  Min 5X/week    PT Plan Current plan remains appropriate    Co-evaluation             End of Session Equipment Utilized During Treatment: Gait belt Activity Tolerance: Patient limited by pain;Patient tolerated treatment well Patient left: in chair;with call bell/phone within reach;with family/visitor present     Time: [id number] PT Time Calculation (min) (ACUTE ONLY): 41 min  Charges:  $Gait Training: 23-37 mins                    G Codes:     Lyanne CoVictoria Zakiah Beckerman, PT  Acute Rehab Services  [phone]  Lyanne CoManess, Alyce Inscore 08/01/2015, 11:42 AM

## 2015-08-02 DIAGNOSIS — K567 Ileus, unspecified: Secondary | ICD-10-CM | POA: Diagnosis not present

## 2015-08-02 LAB — COMPREHENSIVE METABOLIC PANEL
ALT: 20 U/L (ref 14–54)
ANION GAP: 4 — AB (ref 5–15)
AST: 22 U/L (ref 15–41)
Albumin: 2.5 g/dL — ABNORMAL LOW (ref 3.5–5.0)
Alkaline Phosphatase: 69 U/L (ref 38–126)
BILIRUBIN TOTAL: 0.7 mg/dL (ref 0.3–1.2)
BUN: 7 mg/dL (ref 6–20)
CHLORIDE: 103 mmol/L (ref 101–111)
CO2: 31 mmol/L (ref 22–32)
Calcium: 8.3 mg/dL — ABNORMAL LOW (ref 8.9–10.3)
Creatinine, Ser: 0.85 mg/dL (ref 0.44–1.00)
Glucose, Bld: 128 mg/dL — ABNORMAL HIGH (ref 65–99)
POTASSIUM: 3.5 mmol/L (ref 3.5–5.1)
Sodium: 138 mmol/L (ref 135–145)
TOTAL PROTEIN: 5.3 g/dL — AB (ref 6.5–8.1)

## 2015-08-02 LAB — VITAMIN D 25 HYDROXY (VIT D DEFICIENCY, FRACTURES): VIT D 25 HYDROXY: 19.4 ng/mL — AB (ref 30.0–100.0)

## 2015-08-02 MED ORDER — MAGNESIUM CITRATE PO SOLN
1.0000 | Freq: Once | ORAL | Status: AC
  Administered 2015-08-02: 1 via ORAL
  Filled 2015-08-02: qty 296

## 2015-08-02 MED ORDER — NEOSTIGMINE METHYLSULFATE 10 MG/10ML IV SOLN
2.0000 mg | Freq: Once | INTRAVENOUS | Status: AC
  Administered 2015-08-02: 1 mg via INTRAVENOUS
  Filled 2015-08-02: qty 2

## 2015-08-02 NOTE — Evaluation (Signed)
Physical Therapy Evaluation Patient Details Name: Jodi [name] MRN: [id number] DOB: July 28, 1976 Today's Date: 08/02/2015   History of Present Illness  39 yo female with onset of Crush injury by horse with the pelvic fracture (ORIF), closed; right sacral fracture and AP compression pattern; Lt L-2, L-3, L-4 transverse process fractures; Rt ankle sprain  Clinical Impression  Boyfriend present for stair training today and educated and participated on how to A.  Pt able to increase ambulation distance, but limited by pain.  Pt gives forth good effort throughout session.    Follow Up Recommendations Home health PT;Supervision/Assistance - 24 hour    Equipment Recommendations  Rolling walker with 5" wheels;3in1 (PT)    Recommendations for Other Services       Precautions / Restrictions Precautions Precautions: Fall;Back Restrictions Weight Bearing Restrictions: Yes RLE Weight Bearing: Partial weight bearing RLE Partial Weight Bearing Percentage or Pounds: 50 LLE Weight Bearing: Weight bearing as tolerated      Mobility  Bed Mobility Overal bed mobility: Needs Assistance Bed Mobility: Supine to Sit;Sit to Supine     Supine to sit: Min guard Sit to supine: Min assist   General bed mobility comments: Pt able to move without as much A today  Transfers Overall transfer level: Needs assistance Equipment used: Rolling walker (2 wheeled) Transfers: Sit to/from Stand Sit to Stand: Min assist;From elevated surface         General transfer comment: Pt able to stand from recliner and elevated bed with MIN A. Cues for safe technique.  Ambulation/Gait Ambulation/Gait assistance: Min guard Ambulation Distance (Feet): 98 Feet Assistive device: Rolling walker (2 wheeled) Gait Pattern/deviations: Step-to pattern;Antalgic;Wide base of support Gait velocity: decreased   General Gait Details: Pt motivated to walk as far as she could to increase level of independence and to see if  it would help get her bowels moving. Pt with not as much pain in belly today as yesterday.  Stairs Stairs: Yes Stairs assistance: Min assist Stair Management: One rail Left;Step to pattern;Forwards Number of Stairs: 5 General stair comments: Practiced stairs with her boyfriend giving support at forearm and hand.  Pt able to maintain PWB with heavy relianace onf rail and boyfriend's arm  Wheelchair Mobility    Modified Rankin (Stroke Patients Only)       Balance   Sitting-balance support: Feet supported Sitting balance-Leahy Scale: Fair       Standing balance-Leahy Scale: Poor Standing balance comment: requires UE support                             Pertinent Vitals/Pain Pain Assessment: 0-10 Pain Score: 4  Pain Location: 4/10 belly at beginning of session, but groin pain went up to 7-8/10 after gait and stairs. Pain Descriptors / Indicators: Cramping;Aching Pain Intervention(s): Patient requesting pain meds-RN notified;Repositioned;Monitored during session;Limited activity within patient's tolerance    Home Living                        Prior Function                 Hand Dominance        Extremity/Trunk Assessment                         Communication      Cognition Arousal/Alertness: Awake/alert Behavior During Therapy: WFL for tasks assessed/performed Overall Cognitive Status: Within Functional Limits  for tasks assessed                      General Comments      Exercises        Assessment/Plan    PT Assessment    PT Diagnosis     PT Problem List    PT Treatment Interventions     PT Goals (Current goals can be found in the Care Plan section) Acute Rehab PT Goals Patient Stated Goal: to not be in pain PT Goal Formulation: With patient Time For Goal Achievement: 08/14/15 Potential to Achieve Goals: Good    Frequency Min 5X/week   Barriers to discharge        Co-evaluation                End of Session Equipment Utilized During Treatment: Gait belt Activity Tolerance: Patient tolerated treatment well Patient left: in bed;with call bell/phone within reach Nurse Communication: Patient requests pain meds;Mobility status         Time: 1209-1252 PT Time Calculation (min) (ACUTE ONLY): 43 min   Charges:     PT Treatments $Gait Training: 38-52 mins   PT G Codes:        Naje Rice LUBECK 08/02/2015, 2:40 PM

## 2015-08-02 NOTE — Progress Notes (Signed)
Neostigmine injection medication order for colonic ileus.  Given slow IV push.  Initial HR ST 112 HR decreased to 80.  Patient felt light headed.  HR stabilized 80s.  BP 111/61.  No cramping or BM yet.  RN to call if assistance needed

## 2015-08-02 NOTE — Progress Notes (Signed)
Orthopaedic Trauma Service Progress Note  Subjective  Doing ok + flatus No BM Belly still painful  ROS As above   Objective   BP 111/61 mmHg  Pulse 80  Temp(Src) 99.3 F (37.4 C) (Oral)  Resp 16  Ht 5\' 6"  (1.676 m)  Wt 84.7 kg (186 lb 11.7 oz)  BMI 30.15 kg/m2  SpO2 98%  Intake/Output      10/19 0701 - 10/20 0700 10/20 0701 - 10/21 0700   P.O. 240    Total Intake(mL/kg) 240 (2.8)    Net +240          Urine Occurrence 2 x      Labs No new labs  Exam  Gen: resting comfortably in bed Lungs: clear ant fields Cardiac: RRR, s1 and s2 Abd: + BS, + distention Pelvis: stoppa incision looks great, no signs of infection  Dressing R flank intact     Assessment and Plan   POD/HD#: 423   39 y/o female s/p horse riding accident  1. Horse riding accident  2. LC 2 pelvic ring fracture and L5 nerve root irritation s/p ORIF symphysis and R SI screw             WBAT L leg             PWB R leg (50%)             No ROM restrictions             PT/OT evals             Ice prn             Dressing changes prn                           3. FEN              ileus   Appears to be improving   Check cmet               4. DVT/PE prophylaxis             lovenox x3 weeks               ASA 325 BID after Lovenox course   5. R ankle distal fibula avulsion fx             Non-op                 6. Dispo             PT/OT             dc home after GI symptoms improve                Mearl LatinKeith W. Guyla Bless, PA-C [facility] [phone](P) [phone] (O) 08/02/2015 2:05 PM

## 2015-08-02 NOTE — Progress Notes (Signed)
Patient ID: Lenord FellersJaime M Platt, female   DOB: 07/24/1976, 39 y.o.   MRN: [id number]   LOS: 5 days   Subjective: Feels a little better today but still distended with some pain.   Objective: Vital signs in last 24 hours: Temp:  [98.1 F (36.7 C)-98.7 F (37.1 C)] 98.1 F (36.7 C) (10/20 0527) Pulse Rate:  [109-121] 110 (10/20 0527) Resp:  [16-18] 16 (10/20 0527) BP: (109-143)/(58-95) 109/58 mmHg (10/20 0527) SpO2:  [97 %-100 %] 98 % (10/20 0527) Last BM Date: 07/28/15   Physical Exam General appearance: alert and no distress Resp: clear to auscultation bilaterally Cardio: regular rate and rhythm GI: Mild distension, tympanic BS, mild TTP   Assessment/Plan: Fall from horse and subsequently sat on by horse Pelvic ring fracture including symphysis disruption and right sacral FX s/p ORIF -- per Dr. Carola FrostHandy Transverse process fractures L2-4 Right ankle sprain ABL anemia -- Moderate, down somewhat Colonic ileus -- Will give neostigmine Multiple medical problems -- Home meds FEN -- As above VTE -- SCD's, Lovenox Dispo -- PT/OT, home once bowels working    Freeman CaldronMichael J. Arael Piccione, PA-C Pager: [phone] General Trauma PA Pager: [phone]  08/02/2015

## 2015-08-02 NOTE — Care Management Note (Signed)
Case Management Note  Patient Details  Name: Jodi [name] MRN: [id number] Date of Birth: 06/24/1976  Subjective/Objective:                    Action/Plan:  Confirmed face sheet information  Expected Discharge Date:                  Expected Discharge Plan:  Home w Home Health Services  In-House Referral:     Discharge planning Services  CM Consult  Post Acute Care Choice:  Durable Medical Equipment, Home Health Choice offered to:  Patient  DME Arranged:  3-N-1, Walker rolling DME Agency:  [facility]  [facility]  Status of Service:  Completed, signed off  Medicare Important Message Given:    Date Medicare IM Given:    Medicare IM give by:    Date Additional Medicare IM Given:    Additional Medicare Important Message give by:     If discussed at Long Length of Stay Meetings, dates discussed:    Additional Comments:  Jodi [name], Jodi Lattanzio Marie, RN 08/02/2015, 4:04 PM

## 2015-08-03 MED ORDER — METHOCARBAMOL 500 MG PO TABS: 1000.0000 mg | ORAL_TABLET | Freq: Four times a day (QID) | ORAL | Status: DC

## 2015-08-03 MED ORDER — ACETAMINOPHEN 325 MG PO TABS
650.0000 mg | ORAL_TABLET | ORAL | Status: DC | PRN
Start: 2015-08-03 — End: 2017-09-24

## 2015-08-03 MED ORDER — TRAMADOL HCL 50 MG PO TABS: 50.0000 mg | ORAL_TABLET | Freq: Three times a day (TID) | ORAL | Status: DC | PRN

## 2015-08-03 MED ORDER — OXYCODONE HCL 5 MG PO TABS
10.0000 mg | ORAL_TABLET | ORAL | Status: DC | PRN
  Administered 2015-08-03 – 2015-08-05 (×10): 20 mg via ORAL
  Filled 2015-08-03 (×10): qty 4

## 2015-08-03 MED ORDER — TRAMADOL HCL 50 MG PO TABS
100.0000 mg | ORAL_TABLET | Freq: Four times a day (QID) | ORAL | Status: DC
  Administered 2015-08-03 – 2015-08-07 (×14): 100 mg via ORAL
  Filled 2015-08-03 (×14): qty 2

## 2015-08-03 MED ORDER — OXYCODONE HCL 20 MG PO TABS: 10.0000 mg | ORAL_TABLET | ORAL | Status: DC | PRN

## 2015-08-03 MED ORDER — POLYETHYLENE GLYCOL 3350 17 G PO PACK: 17.0000 g | PACK | Freq: Every day | ORAL | Status: DC

## 2015-08-03 MED ORDER — KETOROLAC TROMETHAMINE 30 MG/ML IJ SOLN
30.0000 mg | Freq: Three times a day (TID) | INTRAMUSCULAR | Status: DC | PRN
  Administered 2015-08-03 – 2015-08-04 (×2): 30 mg via INTRAVENOUS
  Filled 2015-08-03 (×2): qty 1

## 2015-08-03 MED ORDER — METHOCARBAMOL 750 MG PO TABS
1500.0000 mg | ORAL_TABLET | Freq: Four times a day (QID) | ORAL | Status: DC
  Administered 2015-08-03 – 2015-08-07 (×14): 1500 mg via ORAL
  Filled 2015-08-03 (×15): qty 2

## 2015-08-03 MED ORDER — METHOCARBAMOL 750 MG PO TABS: 750.0000 mg | ORAL_TABLET | Freq: Three times a day (TID) | ORAL | Status: DC

## 2015-08-03 MED ORDER — TRAMADOL HCL 50 MG PO TABS
50.0000 mg | ORAL_TABLET | Freq: Three times a day (TID) | ORAL | Status: DC | PRN
  Administered 2015-08-03: 100 mg via ORAL
  Filled 2015-08-03: qty 2

## 2015-08-03 MED ORDER — ENOXAPARIN SODIUM 40 MG/0.4ML ~~LOC~~ SOLN: 40.0000 mg | SUBCUTANEOUS | Status: DC

## 2015-08-03 MED ORDER — DOCUSATE SODIUM 100 MG PO CAPS: 100.0000 mg | ORAL_CAPSULE | Freq: Two times a day (BID) | ORAL | Status: DC

## 2015-08-03 MED ORDER — ENOXAPARIN (LOVENOX) PATIENT EDUCATION KIT
PACK | Freq: Once | Status: DC
  Filled 2015-08-03: qty 1

## 2015-08-03 NOTE — Progress Notes (Signed)
PT Cancellation Note  Patient Details Name: Jodi [name] MRN: [id number] DOB: 12/25/1975   Cancelled Treatment:    Reason Eval/Treat Not Completed: Pain limiting ability to participate (severe back pain and waiting for meds).  Try again as time allows.   Ivar DrapeStout, Future Yeldell E 08/03/2015, 4:33 PM   Samul Dadauth Chessie Neuharth, PT MS Acute Rehab Dept. Number: [phone] [phone] and [facility] [phone]

## 2015-08-03 NOTE — Discharge Instructions (Addendum)
Orthopaedic Trauma Service Discharge Instructions  TO PREVENT BLOOD CLOTS AFTER YOUR SURGERY YOU SHOULD TAKE LOVENOX FOR A TOTAL OF 3 WEEKS, THEN SWITCH TO ASPIRIN  TWICE A DAY UNTIL DR. HANDY TELLS YOU TO DISCONTINUE IT.  General Discharge Instructions  WEIGHT BEARING STATUS: Weightbear as tolerated on L Leg, Partial Weightbearing Right Leg (50%)   RANGE OF MOTION/ACTIVITY: No ROM restrictions   PAIN MEDICATION USE AND EXPECTATIONS  You have likely been given narcotic medications to help control your pain.  After a traumatic event that results in an fracture (broken bone) with or without surgery, it is ok to use narcotic pain medications to help control one's pain.  We understand that everyone responds to pain differently and each individual patient will be evaluated on a regular basis for the continued need for narcotic medications. Ideally, narcotic medication use should last no more than 6-8 weeks (coinciding with fracture healing).   As a patient it is your responsibility as well to monitor narcotic medication use and report the amount and frequency you use these medications when you come to your office visit.   We would also advise that if you are using narcotic medications, you should take a dose prior to therapy to maximize you participation.  IF YOU ARE ON NARCOTIC MEDICATIONS IT IS NOT PERMISSIBLE TO OPERATE A MOTOR VEHICLE (MOTORCYCLE/CAR/TRUCK/MOPED) OR HEAVY MACHINERY DO NOT MIX NARCOTICS WITH OTHER CNS (CENTRAL NERVOUS SYSTEM) DEPRESSANTS SUCH AS ALCOHOL  Diet: as you were eating previously.  Can use over the counter stool softeners and bowel preparations, such as Miralax, to help with bowel movements.  Narcotics can be constipating.  Be sure to drink plenty of fluids  Wound Care: dressing changes as needed, see detailed instructions below   STOP SMOKING OR USING NICOTINE PRODUCTS!!!!  As discussed nicotine severely impairs your body's ability to heal surgical and traumatic  wounds but also impairs bone healing.  Wounds and bone heal by forming microscopic blood vessels (angiogenesis) and nicotine is a vasoconstrictor (essentially, shrinks blood vessels).  Therefore, if vasoconstriction occurs to these microscopic blood vessels they essentially disappear and are unable to deliver necessary nutrients to the healing tissue.  This is one modifiable factor that you can do to dramatically increase your chances of healing your injury.    (This means no smoking, no nicotine gum, patches, etc)  DO NOT USE NONSTEROIDAL ANTI-INFLAMMATORY DRUGS (NSAID'S)  Using products such as Advil (ibuprofen), Aleve (naproxen), Motrin (ibuprofen) for additional pain control during fracture healing can delay and/or prevent the healing response.  If you would like to take over the counter (OTC) medication, Tylenol (acetaminophen) is ok.  However, some narcotic medications that are given for pain control contain acetaminophen as well. Therefore, you should not exceed more than 4000 mg of tylenol in a day if you do not have liver disease.  Also note that there are may OTC medicines, such as cold medicines and allergy medicines that my contain tylenol as well.  If you have any questions about medications and/or interactions please ask your doctor/PA or your pharmacist.      ICE AND ELEVATE INJURED/OPERATIVE EXTREMITY  Using ice and elevating the injured extremity above your heart can help with swelling and pain control.  Icing in a pulsatile fashion, such as 20 minutes on and 20 minutes off, can be followed.    Do not place ice directly on skin. Make sure there is a barrier between to skin and the ice pack.    Using  frozen items such as frozen peas works well as the conform nicely to the are that needs to be iced.  USE AN ACE WRAP OR TED HOSE FOR SWELLING CONTROL  In addition to icing and elevation, Ace wraps or TED hose are used to help limit and resolve swelling.  It is recommended to use Ace wraps or  TED hose until you are informed to stop.    When using Ace Wraps start the wrapping distally (farthest away from the body) and wrap proximally (closer to the body)   Example: If you had surgery on your leg or thing and you do not have a splint on, start the ace wrap at the toes and work your way up to the thigh        If you had surgery on your upper extremity and do not have a splint on, start the ace wrap at your fingers and work your way up to the upper arm  IF YOU ARE IN A SPLINT OR CAST DO NOT REMOVE IT FOR ANY REASON   If your splint gets wet for any reason please contact the office immediately. You may shower in your splint or cast as long as you keep it dry.  This can be done by wrapping in a cast cover or garbage back (or similar)  Do Not stick any thing down your splint or cast such as pencils, money, or hangers to try and scratch yourself with.  If you feel itchy take benadryl as prescribed on the bottle for itching  IF YOU ARE IN A CAM BOOT (BLACK BOOT)  You may remove boot periodically. Perform daily dressing changes as noted below.  Wash the liner of the boot regularly and wear a sock when wearing the boot. It is recommended that you sleep in the boot until told otherwise  CALL THE OFFICE WITH ANY QUESTIONS OR CONCERTS: [phone]     Discharge Pin Site Instructions  Dress pins daily with Kerlix roll starting on POD 2. Wrap the Kerlix so that it tamps the skin down around the pin-skin interface to prevent/limit motion of the skin relative to the pin.  (Pin-skin motion is the primary cause of pain and infection related to external fixator pin sites).  Remove any crust or coagulum that may obstruct drainage with a saline moistened gauze or soap and water.  After POD 3, if there is no discernable drainage on the pin site dressing, the interval for change can by increased to every other day.  You may shower with the fixator, cleaning all pin sites gently with soap and water.  If  you have a surgical wound this needs to be completely dry and without drainage before showering.  The extremity can be lifted by the fixator to facilitate wound care and transfers.  Notify the office/Doctor if you experience increasing drainage, redness, or pain from a pin site, or if you notice purulent (thick, snot-like) drainage.  Discharge Wound Care Instructions  Do NOT apply any ointments, solutions or lotions to pin sites or surgical wounds.  These prevent needed drainage and even though solutions like hydrogen peroxide kill bacteria, they also damage cells lining the pin sites that help fight infection.  Applying lotions or ointments can keep the wounds moist and can cause them to breakdown and open up as well. This can increase the risk for infection. When in doubt call the office.  Surgical incisions should be dressed daily.  If any drainage is noted, use one layer  of adaptic, then gauze, Kerlix, and an ace wrap.  Once the incision is completely dry and without drainage, it may be left open to air out.  Showering may begin 36-48 hours later.  Cleaning gently with soap and water.  Traumatic wounds should be dressed daily as well.    One layer of adaptic, gauze, Kerlix, then ace wrap.  The adaptic can be discontinued once the draining has ceased    If you have a wet to dry dressing: wet the gauze with saline the squeeze as much saline out so the gauze is moist (not soaking wet), place moistened gauze over wound, then place a dry gauze over the moist one, followed by Kerlix wrap, then ace wrap.

## 2015-08-03 NOTE — Progress Notes (Signed)
Patient was not discharged today because of pain issues. MD's are aware and changed her pain regime. Trauma doctors will reassess for discharge in the morning.   Patient I.V was removed this afternoon due to yellowish/green drainage from site. The tissue under the site was hardened. Dr Derrell Lollingamirez was made aware and advised to apply warm compresses to the site.

## 2015-08-03 NOTE — Progress Notes (Signed)
Orthopaedic Trauma Service Progress Note  Subjective  Feeling better + BM Ready to go home   Dec tingling R great toe   ROS As above   Objective   BP 112/72 mmHg  Pulse 105  Temp(Src) 98.2 F (36.8 C) (Oral)  Resp 16  Ht 5\' 6"  (1.676 m)  Wt 84.7 kg (186 lb 11.7 oz)  BMI 30.15 kg/m2  SpO2 100%  Intake/Output      10/20 0701 - 10/21 0700 10/21 0701 - 10/22 0700   P.O. 240    Total Intake(mL/kg) 240 (2.8)    Urine (mL/kg/hr) 0 (0)    Stool 2 (0)    Total Output 2     Net +238          Urine Occurrence 2 x    Stool Occurrence 1 x      Labs  No new labs  Exam   Gen: sitting ing bedside chair, appears well, NAD Pelvis: R flank incision looks great, dressing changed  Ext:       Right Lower Extremity   Improved sensation along DPN distribution  SPN and TN sensation intact  Ext warm  Excellent strength with resisted EHL and ankle extension testing    Assessment and Plan   POD/HD#: 514   39 y/o female s/p horse riding accident  1. Horse riding accident  2. LC 2 pelvic ring fracture and L5 nerve root irritation s/p ORIF symphysis and R SI screw             WBAT L leg             PWB R leg (50%)             No ROM restrictions             PT/OT evals             Ice prn             Dressing changes prn                            3. FEN              ileus                         + BM   Improved               4. DVT/PE prophylaxis             lovenox x3 weeks               ASA 325 BID after Lovenox course   5. R ankle distal fibula avulsion fx             Non-op                 6. Dispo            dc home today   Follow up 10-14 days    Mearl LatinKeith W. Conchetta Lamia, PA-C [facility] [phone](P) [phone] (O) 08/03/2015 9:17 AM

## 2015-08-03 NOTE — Progress Notes (Signed)
Occupational Therapy Treatment Patient Details Name: TEEGAN GUINTHER MRN: [id number] DOB: Apr 24, 1976 Today's Date: 08/03/2015    History of present illness 39 yo female with onset of Crush injury by horse with the pelvic fracture (ORIF), closed; right sacral fracture and AP compression pattern; Lt L-2, L-3, L-4 transverse process fractures; Rt ankle sprain   OT comments  Focus of session included toilet transfer with clothing manipulation and hygiene and education/demonstration of adaptive equipment for LB dressing and toileting. Pt provided with education and handout regarding back precautions and was able to recall using teach back method. Pt making progress towards OT goals.  Follow Up Recommendations  Home health OT;Supervision/Assistance - 24 hour    Equipment Recommendations  3 in 1 bedside comode    Recommendations for Other Services      Precautions / Restrictions Precautions Precautions: Fall;Back Precaution Booklet Issued: Yes (comment) Precaution Comments: reviewed back precautions and weight bearing status Restrictions Weight Bearing Restrictions: Yes RLE Weight Bearing: Partial weight bearing RLE Partial Weight Bearing Percentage or Pounds: 50 LLE Weight Bearing: Weight bearing as tolerated Other Position/Activity Restrictions: PWB RLE       Mobility Bed Mobility Overal bed mobility: Needs Assistance Bed Mobility: Sit to supine     Supine to sit: Min assist     General bed mobility comments: Therapist use chuck pad to straighten pt's hips on to bed  Transfers Overall transfer level: Needs assistance Equipment used: Rolling walker (2 wheeled) Transfers: Sit to/from Stand Sit to Stand: Min guard              Balance Overall balance assessment: Needs assistance Sitting-balance support: No upper extremity supported;Feet supported Sitting balance-Leahy Scale: Fair     Standing balance support: Bilateral upper extremity supported Standing  balance-Leahy Scale: Poor Standing balance comment: requires UE support from rolling walker                   ADL Overall ADL's : Needs assistance/impaired                     Lower Body Dressing: Set up;Sitting/lateral leans Lower Body Dressing Details (indicate cue type and reason): verbal cues for instruction on how to use sock-aid Toilet Transfer: Supervision/safety;Ambulation;RW;Comfort height toilet   Toileting- Clothing Manipulation and Hygiene: Total assistance;Sit to/from stand       Functional mobility during ADLs: Min guard;Rolling walker General ADL Comments: Pt educated in adaptive equipment to assist with LB dressing and toileting while maintaining back precautions. Pt able to return safe demonstration using sock-aid      Vision                     Perception     Praxis      Cognition   Behavior During Therapy: [facility] for tasks assessed/performed Overall Cognitive Status: Within Functional Limits for tasks assessed                       Extremity/Trunk Assessment               Exercises     Shoulder Instructions       General Comments      Pertinent Vitals/ Pain       Pain Assessment: 0-10 Pain Score: 7  Pain Location: back Pain Descriptors / Indicators: Aching Pain Intervention(s): Repositioned;Patient requesting pain meds-RN notified  Home Living  Prior Functioning/Environment              Frequency Min 2X/week     Progress Toward Goals  OT Goals(current goals can now be found in the care plan section)  Progress towards OT goals: Progressing toward goals  Acute Rehab OT Goals Patient Stated Goal: to able to wipe myself OT Goal Formulation: With patient Time For Goal Achievement: 08/15/15 Potential to Achieve Goals: Good ADL Goals Pt Will Perform Lower Body Bathing: with set-up;with supervision;with adaptive equipment;sit to/from  stand Pt Will Perform Lower Body Dressing: with set-up;with supervision;with adaptive equipment;sit to/from stand Pt Will Transfer to Toilet: with supervision;ambulating;bedside commode Pt Will Perform Toileting - Clothing Manipulation and hygiene: sit to/from stand;with modified independence Pt Will Perform Tub/Shower Transfer: with supervision;ambulating;3 in 1;with caregiver independent in assisting;rolling walker  Plan Discharge plan remains appropriate    Co-evaluation                 End of Session Equipment Utilized During Treatment: Gait belt;Rolling walker   Activity Tolerance Patient tolerated treatment well;Patient limited by pain   Patient Left in bed;with call bell/phone within reach   Nurse Communication          Time: [id number] OT Time Calculation (min): 24 min  Charges: OT General Charges $OT Visit: 1 Procedure OT Treatments $Self Care/Home Management : 23-37 mins  Smiley HousemanJames Landon Bowers 08/03/2015, 11:17 AM    Mateo FlowJones, Brynn   OTR/L Pager: [phone] Office: [phone] .

## 2015-08-03 NOTE — Progress Notes (Addendum)
4 Days Post-Op  Subjective: No abd pain. No n/v. +BMs. Now has some worsening back pain. No parethesias. Current pain regimen not controlling back pain  Objective: Vital signs in last 24 hours: Temp:  [98.2 F (36.8 C)-98.5 F (36.9 C)] 98.2 F (36.8 C) (10/21 0435) Pulse Rate:  [105-110] 105 (10/21 0435) Resp:  [16] 16 (10/21 0435) BP: (112-119)/(72) 112/72 mmHg (10/21 0435) SpO2:  [99 %-100 %] 100 % (10/21 0435) Last BM Date: 08/03/15  Intake/Output from previous day: 10/20 0701 - 10/21 0700 In: 240 [P.O.:240] Out: 2 [Stool:2] Intake/Output this shift:    Alert, nontoxic, resting comfortably cta b/l Reg Soft, min distension, +BS, nontender Dressing c/d/i No edema  Lab Results:   Recent Labs  08/01/15 0355  WBC 8.3  HGB 9.6*  HCT 28.7*  PLT 310   BMET  Recent Labs  08/02/15 1451  NA 138  K 3.5  CL 103  CO2 31  GLUCOSE 128*  BUN 7  CREATININE 0.85  CALCIUM 8.3*   PT/INR No results for input(s): LABPROT, INR in the last 72 hours. ABG No results for input(s): PHART, HCO3 in the last 72 hours.  Invalid input(s): PCO2, PO2  Studies/Results: Dg Abd Portable 1v  08/01/2015  CLINICAL DATA:  Abdominal distention EXAM: PORTABLE ABDOMEN - 1 VIEW COMPARISON:  07/31/2015 pelvic images FINDINGS: Gaseous distention of the colon diffusely. Large stool burden in the right colon. Prior cholecystectomy. No organomegaly or free air. No suspicious calcification. Screw across the right SI joint, stable. IMPRESSION: Mild gaseous distention of the colon, likely mild ileus. Large stool burden in the right colon. Electronically Signed   By: Rolm Baptise M.D.   On: 08/01/2015 12:13    Anti-infectives: Anti-infectives    None      Assessment/Plan: s/p Procedure(s): SACRO-ILIAC PINNING (Right) OPEN REDUCTION INTERNAL FIXATION (ORIF) PELVIC FRACTURE (N/A) Fall from horse and subsequently sat on by horse Pelvic ring fracture including symphysis disruption and right  sacral FX s/p ORIF -- per Dr. Marcelino Scot Transverse process fractures L2-4 Right ankle sprain ABL anemia -- Moderate, down somewhat Colonic ileus -- resolved. Having BMs Multiple medical problems -- Home meds FEN -- As above VTE -- SCD's, Lovenox, discussed ortho plan for extended chemical vte prophylaxis; order lovenox teaching kit Dispo -- possibly home later today; will adjust pain medications, add robaxin and re-evaluate this afternoon  Leighton Ruff. Redmond Pulling, MD, FACS General, Bariatric, & Minimally Invasive Surgery [facility], [place]    LOS: 6 days    Gayland Curry 08/03/2015

## 2015-08-03 NOTE — Progress Notes (Signed)
Pt for dc home today with boyfriend.   [facility] notified of dc order; to deliver DME to pt room prior to dc.   [facility] dc date.    Quintella BatonJulie W. Trine Fread, RN, BSN  Trauma/Neuro ICU Case Manager [phone]

## 2015-08-04 LAB — VITAMIN D 1,25 DIHYDROXY
VITAMIN D2 1, 25 (OH): 38 pg/mL
VITAMIN D3 1, 25 (OH): 36 pg/mL
Vitamin D 1, 25 (OH)2 Total: 74 pg/mL

## 2015-08-04 MED ORDER — OXYCODONE HCL ER 15 MG PO T12A
15.0000 mg | EXTENDED_RELEASE_TABLET | Freq: Two times a day (BID) | ORAL | Status: DC
  Administered 2015-08-04 – 2015-08-05 (×4): 15 mg via ORAL
  Filled 2015-08-04 (×4): qty 1

## 2015-08-04 NOTE — Progress Notes (Signed)
Patient ID: Jodi [name], female   DOB: May 21, 1976, 39 y.o.   MRN: [id number]  LOS: 7 days   Subjective: Still with moderate pain.  Had a long discussion re pain med and expectations.  Voiding and having BMS.    Objective: Vital signs in last 24 hours: Temp:  [98 F (36.7 C)-98.8 F (37.1 C)] 98.6 F (37 C) (10/22 0440) Pulse Rate:  [104-113] 104 (10/22 0440) Resp:  [16-18] 16 (10/22 0440) BP: (119-130)/(70-79) 119/70 mmHg (10/22 0440) SpO2:  [97 %-100 %] 97 % (10/22 0440) Last BM Date: 08/03/15  Lab Results:  CBC No results for input(s): WBC, HGB, HCT, PLT in the last 72 hours. BMET  Recent Labs  08/02/15 1451  NA 138  K 3.5  CL 103  CO2 31  GLUCOSE 128*  BUN 7  CREATININE 0.85  CALCIUM 8.3*    Imaging: No results found.   PE: General appearance: alert, cooperative and no distress Resp: clear to auscultation bilaterally Cardio: regular rate and rhythm, S1, S2 normal, no murmur, click, rub or gallop GI: soft, non-tender; bowel sounds normal; no masses,  no organomegaly Extremities: extremities normal, atraumatic, no cyanosis or edema     Patient Active Problem List   Diagnosis Date Noted  . Ileus ([employer]) 08/02/2015  . Fall from horse 07/30/2015  . Lumbar transverse process fracture ([place]) 07/30/2015  . Sprain of ankle 07/30/2015  . Acute blood loss anemia 07/30/2015  . Pelvic ring fracture ([place]) 07/28/2015    Assessment/Plan: Fall from horse and subsequently sat on by horse Pelvic ring fracture including symphysis disruption and right sacral FX s/p ORIF -- per Dr. Marcelino Scot Transverse process fractures L2-4 Right ankle sprain ABL anemia -- Moderate, AM CBC Colonic ileus -- resolved. Having BMs Multiple medical problems -- Home meds FEN -- add oxycontin 36m BID.  Medication risks, benefits and therapeutic alternatives were reviewed with the patient.  She verbalizes understanding. VTE -- SCD's, Lovenox, discussed ortho plan for extended chemical vte  prophylaxis; order lovenox teaching kit Dispo -- pain control. Plan for DC home    ERitchie AOregonPager: [phone] Trauma PA Pager: [phone]  08/04/2015 8:59 AM

## 2015-08-04 NOTE — Progress Notes (Signed)
Physical Therapy Treatment Patient Details Name: Jodi FellersJaime M Beedle MRN: [id number] DOB: 08/23/1976 Today's Date: 08/04/2015    History of Present Illness 39 yo female with onset of Crush injury by horse with the pelvic fracture (ORIF), closed; right sacral fracture and AP compression pattern; Lt L-2, L-3, L-4 transverse process fractures; Rt ankle sprain    PT Comments    Patient agreeable to ambulation this session. She feels safe with steps. Patient stated that changed up her medications and believes it will help. She is moving around room well throughout the day with staff and boyfirend. She is hoping to go home tomorrow. She became nauseated at end of session. RN aware.   Follow Up Recommendations  Home health PT;Supervision/Assistance - 24 hour     Equipment Recommendations  Rolling walker with 5" wheels;3in1 (PT)    Recommendations for Other Services       Precautions / Restrictions Precautions Precautions: Fall;Back Precaution Comments: reviewed back precautions and weight bearing status Restrictions RLE Weight Bearing: Partial weight bearing LLE Weight Bearing: Weight bearing as tolerated Other Position/Activity Restrictions: PWB RLE    Mobility  Bed Mobility Overal bed mobility: Modified Independent                Transfers Overall transfer level: Needs assistance     Sit to Stand: Supervision         General transfer comment: Cues for safe transfer  Ambulation/Gait Ambulation/Gait assistance: Supervision Ambulation Distance (Feet): 140 Feet Assistive device: Rolling walker (2 wheeled) Gait Pattern/deviations: Step-through pattern;Decreased stride length Gait velocity: decreased   General Gait Details: Safe use of RW. Cues to not step past wheels in the front   Stairs            Wheelchair Mobility    Modified Rankin (Stroke Patients Only)       Balance                                    Cognition  Arousal/Alertness: Awake/alert Behavior During Therapy: WFL for tasks assessed/performed Overall Cognitive Status: Within Functional Limits for tasks assessed                      Exercises      General Comments        Pertinent Vitals/Pain Pain Score: 7  Pain Location: back Pain Descriptors / Indicators: Aching Pain Intervention(s): Monitored during session;Premedicated before session    Home Living                      Prior Function            PT Goals (current goals can now be found in the care plan section) Progress towards PT goals: Progressing toward goals    Frequency  Min 5X/week    PT Plan Current plan remains appropriate    Co-evaluation             End of Session Equipment Utilized During Treatment: Gait belt Activity Tolerance: Other (comment) (nausea) Patient left: in chair;with call bell/phone within reach     Time: 1006-1026 PT Time Calculation (min) (ACUTE ONLY): 20 min  Charges:  $Gait Training: 8-22 mins                    G Codes:      Fredrich BirksRobinette, Julia Elizabeth 08/04/2015, 10:29 AM 08/04/2015 Robinette, Adline PotterJulia Elizabeth  PTA

## 2015-08-04 NOTE — Progress Notes (Signed)
Patient has had multiple complaints of increasing back pain as the night has gone on. Dr. Derrell Lollingamirez was made aware and he ordered 30 mg of Toradol every 8 hours prn. Patient stated that "It's helped alittle however I don't just want pain medication I want to know why my back has started hurting so bad". Requests "tests" be run in the AM. Cindee SaltMcBride,Chennel Olivos K, RN

## 2015-08-05 LAB — CBC
HEMATOCRIT: 28 % — AB (ref 36.0–46.0)
HEMOGLOBIN: 9.2 g/dL — AB (ref 12.0–15.0)
MCH: 29.5 pg (ref 26.0–34.0)
MCHC: 32.9 g/dL (ref 30.0–36.0)
MCV: 89.7 fL (ref 78.0–100.0)
Platelets: 414 10*3/uL — ABNORMAL HIGH (ref 150–400)
RBC: 3.12 MIL/uL — AB (ref 3.87–5.11)
RDW: 13.1 % (ref 11.5–15.5)
WBC: 7.2 10*3/uL (ref 4.0–10.5)

## 2015-08-05 LAB — BASIC METABOLIC PANEL
Anion gap: 7 (ref 5–15)
BUN: 10 mg/dL (ref 6–20)
CHLORIDE: 98 mmol/L — AB (ref 101–111)
CO2: 32 mmol/L (ref 22–32)
CREATININE: 1 mg/dL (ref 0.44–1.00)
Calcium: 8.9 mg/dL (ref 8.9–10.3)
GFR calc Af Amer: >60 mL/min (ref >60)
GFR calc non Af Amer: >60 mL/min (ref >60)
GLUCOSE: 111 mg/dL — AB (ref 65–99)
POTASSIUM: 4 mmol/L (ref 3.5–5.1)
SODIUM: 137 mmol/L (ref 135–145)

## 2015-08-05 MED ORDER — HYDROMORPHONE HCL 2 MG PO TABS
4.0000 mg | ORAL_TABLET | ORAL | Status: DC | PRN
  Administered 2015-08-05 – 2015-08-06 (×3): 4 mg via ORAL
  Filled 2015-08-05 (×3): qty 2

## 2015-08-05 NOTE — Progress Notes (Signed)
Patient ID: Jodi [name], female   DOB: Aug 25, 1976, 39 y.o.   MRN: [id number]  LOS: 8 days   Subjective: Low back pain, worse at night.  Does not appear neuropathic.  VSS.  Afebrile.    Objective: Vital signs in last 24 hours: Temp:  [97.8 F (36.6 C)-98.1 F (36.7 C)] 97.8 F (36.6 C) (10/23 0658) Pulse Rate:  [96-107] 100 (10/23 0658) Resp:  [16-18] 16 (10/23 0658) BP: (118-145)/(72-76) 120/72 mmHg (10/23 0658) SpO2:  [98 %-100 %] 98 % (10/23 0658) Last BM Date: 08/03/15  Lab Results:  CBC  Recent Labs  08/05/15 0422  WBC 7.2  HGB 9.2*  HCT 28.0*  PLT 414*   BMET  Recent Labs  08/02/15 1451 08/05/15 0422  NA 138 137  K 3.5 4.0  CL 103 98*  CO2 31 32  GLUCOSE 128* 111*  BUN 7 10  CREATININE 0.85 1.00  CALCIUM 8.3* 8.9    Imaging: No results found.   PE: General appearance: alert, cooperative and no distress Resp: clear to auscultation bilaterally Cardio: regular rate and rhythm, S1, S2 normal, no murmur, click, rub or gallop GI: soft, non-tender; bowel sounds normal; no masses, no organomegaly Extremities: extremities normal, atraumatic, no cyanosis or edema   Patient Active Problem List   Diagnosis Date Noted  . Ileus ([place]) 08/02/2015  . Fall from horse 07/30/2015  . Lumbar transverse process fracture ([place]) 07/30/2015  . Sprain of ankle 07/30/2015  . Acute blood loss anemia 07/30/2015  . Pelvic ring fracture ([place]) 07/28/2015    Assessment/Plan: Fall from horse and subsequently sat on by horse Pelvic ring fracture including symphysis disruption and right sacral FX s/p ORIF -- per Dr. Marcelino Scot Transverse process fractures L2-4 Right ankle sprain ABL anemia -- stable Colonic ileus -- resolved. Having BMs Multiple medical problems -- Home meds FEN -- continue with oxy ER 9m BID, change oxy IR to dilaudid, claims oxy IR does not help at all only dilaudid does.  VTE -- SCD's, Lovenox, discussed ortho plan for extended chemical vte  prophylaxis; order lovenox teaching kit Dispo -- pain control.    Jodi [name], ANP-BC   08/05/2015 10:07 AM

## 2015-08-05 NOTE — Progress Notes (Signed)
Pt continues to complain of sever pain in her lower back when moving. She attempted to manage her pain this evening with pain pills but was unable to do so, and she required dilaudid for pain relief.

## 2015-08-06 ENCOUNTER — Encounter (HOSPITAL_COMMUNITY): Payer: Self-pay | Admitting: Orthopedic Surgery

## 2015-08-06 MED ORDER — OXYCODONE HCL ER 10 MG PO T12A
20.0000 mg | EXTENDED_RELEASE_TABLET | Freq: Two times a day (BID) | ORAL | Status: DC
  Administered 2015-08-06 – 2015-08-07 (×3): 20 mg via ORAL
  Filled 2015-08-06 (×3): qty 2

## 2015-08-06 MED ORDER — HYDROMORPHONE HCL 2 MG PO TABS
4.0000 mg | ORAL_TABLET | ORAL | Status: DC | PRN
  Administered 2015-08-06 – 2015-08-07 (×5): 8 mg via ORAL
  Filled 2015-08-06 (×6): qty 4

## 2015-08-06 NOTE — Progress Notes (Signed)
Occupational Therapy Treatment Patient Details Name: Jodi [name] MRN: [id number] DOB: April 09, 1976 Today's Date: 08/06/2015    History of present illness 39 yo female with onset of Crush injury by horse with the pelvic fracture (ORIF), closed; right sacral fracture and AP compression pattern; Lt L-2, L-3, L-4 transverse process fractures; Rt ankle sprain   OT comments  Focus of session included shower transfer and reviewed back precautions. Session limited due to increase pain, patient medicated during session and still c/o pain ranking 7/10. However, pt able to perform safe demonstration of shower transfer using rolling walker and 3-in-1 BSC. Pt provided with another back precaution handout due to pt loosing the first one and stating "I just found out this morning I cant bend my back". Overall, pt progressing towards OT goals.  Follow Up Recommendations  Home health OT;Supervision/Assistance - 24 hour    Equipment Recommendations  3 in 1 bedside comode    Recommendations for Other Services      Precautions / Restrictions Precautions Precautions: Fall;Back Precaution Comments: reviewed back precautions Restrictions Weight Bearing Restrictions: Yes RLE Weight Bearing: Partial weight bearing RLE Partial Weight Bearing Percentage or Pounds: 50       Mobility Bed Mobility Overal bed mobility: Modified Independent             General bed mobility comments: reviewed log roll technique, pt "states to painful to perfrom log roll". Pt seen in long sitting then uses hips to pivot to EOB while maintaining back precautions.  Transfers Overall transfer level: Needs assistance Equipment used: Rolling walker (2 wheeled) Transfers: Sit to/from Stand Sit to Stand: Supervision              Balance Overall balance assessment: Needs assistance Sitting-balance support: No upper extremity supported;Feet supported Sitting balance-Leahy Scale: Good     Standing balance support:  Bilateral upper extremity supported Standing balance-Leahy Scale: Fair                     ADL                           Toilet Transfer: Supervision/safety;Ambulation;RW;BSC       Tub/ Shower Transfer: Walk-in shower;Supervision/safety;Ambulation;Rolling walker;3 in 1   Functional mobility during ADLs: Supervision/safety;Rolling walker General ADL Comments: Pt educated in technique for shower transfer to ensure safety while maintaining back precautions. Pt reports have built-in shower seats to allow her to sit to shower. Pt able to return safe demonstration of shower transfer.      Vision                     Perception     Praxis      Cognition   Behavior During Therapy: WFL for tasks assessed/performed Overall Cognitive Status: Within Functional Limits for tasks assessed                       Extremity/Trunk Assessment               Exercises     Shoulder Instructions       General Comments      Pertinent Vitals/ Pain       Pain Assessment: 0-10 Pain Score: 7  Pain Location: back Pain Descriptors / Indicators: Aching;Throbbing Pain Intervention(s): Limited activity within patient's tolerance;Premedicated before session;Repositioned  Home Living  Prior Functioning/Environment              Frequency Min 2X/week     Progress Toward Goals  OT Goals(current goals can now be found in the care plan section)  Progress towards OT goals: Progressing toward goals  Acute Rehab OT Goals Patient Stated Goal: to go home OT Goal Formulation: With patient Time For Goal Achievement: 08/15/15 Potential to Achieve Goals: Good ADL Goals Pt Will Perform Lower Body Bathing: with set-up;with supervision;with adaptive equipment;sit to/from stand Pt Will Perform Lower Body Dressing: with set-up;with supervision;with adaptive equipment;sit to/from stand Pt Will Transfer  to Toilet: with supervision;ambulating;bedside commode Pt Will Perform Toileting - Clothing Manipulation and hygiene: sit to/from stand;with modified independence Pt Will Perform Tub/Shower Transfer: with supervision;ambulating;3 in 1;with caregiver independent in assisting;rolling walker  Plan Discharge plan remains appropriate    Co-evaluation                 End of Session Equipment Utilized During Treatment: Gait belt;Rolling walker   Activity Tolerance Patient limited by pain   Patient Left in bed;with call bell/phone within reach   Nurse Communication          Time: [id number] OT Time Calculation (min): 30 min  Charges: OT General Charges $OT Visit: 1 Procedure OT Treatments $Self Care/Home Management : 23-37 mins  [name] 08/06/2015, 12:03 PM

## 2015-08-06 NOTE — Progress Notes (Signed)
Patient ID: Jodi [name], female   DOB: 1975-10-16, 39 y.o.   MRN: [id number]   LOS: 9 days   Subjective: Continues to c/o severe LBP   Objective: Vital signs in last 24 hours: Temp:  [97.9 F (36.6 C)-98.2 F (36.8 C)] 98.2 F (36.8 C) (10/24 0628) Pulse Rate:  [90-111] 110 (10/24 0628) Resp:  [16-18] 16 (10/24 0628) BP: (125-133)/(70-77) 125/70 mmHg (10/24 0628) SpO2:  [98 %-100 %] 100 % (10/24 0628) Last BM Date: 08/03/15   Laboratory  CBC  Recent Labs  08/05/15 0422  WBC 7.2  HGB 9.2*  HCT 28.0*  PLT 414*   BMET  Recent Labs  08/05/15 0422  NA 137  K 4.0  CL 98*  CO2 32  GLUCOSE 111*  BUN 10  CREATININE 1.00  CALCIUM 8.9    Physical Exam General appearance: alert and no distress Resp: clear to auscultation bilaterally Cardio: regular rate and rhythm GI: normal findings: bowel sounds normal and soft, non-tender   Assessment/Plan: Fall from horse and subsequently sat on by horse Pelvic ring fracture including symphysis disruption and right sacral FX s/p ORIF -- per Dr. Marcelino Scot Transverse process fractures L2-4 Right ankle sprain ABL anemia -- stable Colonic ileus -- resolved. Having BMs Multiple medical problems -- Home meds FEN -- Oral dilaudid helping better than oxycodone but not lasting nearly long enough. Will increase dose and increase OxyContin. VTE -- SCD's, Lovenox, discussed ortho plan for extended chemical vte prophylaxis; order lovenox teaching kit Dispo -- pain control.     Lisette Abu, PA-C Pager: [phone] General Trauma PA Pager: [phone]  08/06/2015

## 2015-08-06 NOTE — Progress Notes (Signed)
Physical Therapy Treatment Patient Details Name: Jodi [name] MRN: [id number] DOB: 09/18/1976 Today's Date: 08/06/2015    History of Present Illness 39 yo female with onset of Crush injury by horse with the pelvic fracture (ORIF), closed; right sacral fracture and AP compression pattern; Lt L-2, L-3, L-4 transverse process fractures; Rt ankle sprain    PT Comments    Pt with positive outlook and motivated to continue to progress function. Pt increased her own gait goal and achieved today with cues. Pt states she plans to sleep in recliner not bed and will have assist of boyfriend and neighbor at home. Will continue to follow.   Follow Up Recommendations  Home health PT;Supervision/Assistance - 24 hour     Equipment Recommendations       Recommendations for Other Services       Precautions / Restrictions Precautions Precautions: Fall;Back Precaution Comments: reviewed back precautions and weight bearing status Restrictions RLE Weight Bearing: Partial weight bearing RLE Partial Weight Bearing Percentage or Pounds: 50    Mobility  Bed Mobility Overal bed mobility: Modified Independent             General bed mobility comments: pt educated for back precautions but pt unable to roll due to pelvic pain. Pt transitions to long sitting then pivots to EOB without twisting or lateral flexion of spine  Transfers Overall transfer level: Modified independent                  Ambulation/Gait Ambulation/Gait assistance: Supervision Ambulation Distance (Feet): 200 Feet Assistive device: Rolling walker (2 wheeled) Gait Pattern/deviations: Step-through pattern;Decreased stride length;Trunk flexed   Gait velocity interpretation: Below normal speed for age/gender General Gait Details: cues for posture, position in RW and not stepping past wheels. Good use of PWB throughout   Stairs            Wheelchair Mobility    Modified Rankin (Stroke Patients Only)        Balance                                    Cognition Arousal/Alertness: Awake/alert Behavior During Therapy: WFL for tasks assessed/performed Overall Cognitive Status: Within Functional Limits for tasks assessed                      Exercises      General Comments        Pertinent Vitals/Pain Pain Score: 7  Pain Location: back  Pain Descriptors / Indicators: Aching;Sore Pain Intervention(s): Limited activity within patient's tolerance;Monitored during session;Repositioned;Patient requesting pain meds-RN notified    Home Living                      Prior Function            PT Goals (current goals can now be found in the care plan section) Progress towards PT goals: Progressing toward goals    Frequency       PT Plan Current plan remains appropriate    Co-evaluation             End of Session   Activity Tolerance: Patient tolerated treatment well Patient left: in chair;with call bell/phone within reach     Time: 7829-5621 PT Time Calculation (min) (ACUTE ONLY): 26 min  Charges:  $Gait Training: 8-22 mins $Therapeutic Activity: 8-22 mins  G CodesDelorse Lek:      Tabor, Ardyth Kelso Beth 08/06/2015, 8:14 AM Delaney MeigsMaija Tabor Fordyce Lepak, PT [phone]

## 2015-08-07 DIAGNOSIS — S8261XA Displaced fracture of lateral malleolus of right fibula, initial encounter for closed fracture: Secondary | ICD-10-CM | POA: Diagnosis present

## 2015-08-07 MED ORDER — HYDROMORPHONE HCL 4 MG PO TABS: 4.0000 mg | ORAL_TABLET | ORAL | Status: DC | PRN

## 2015-08-07 MED ORDER — TRAMADOL HCL 50 MG PO TABS: 100.0000 mg | ORAL_TABLET | Freq: Four times a day (QID) | ORAL | Status: DC

## 2015-08-07 MED ORDER — OXYCODONE HCL ER 20 MG PO T12A
20.0000 mg | EXTENDED_RELEASE_TABLET | Freq: Two times a day (BID) | ORAL | Status: DC
Start: 2015-08-07 — End: 2015-09-06

## 2015-08-07 MED ORDER — METHOCARBAMOL 750 MG PO TABS: 1500.0000 mg | ORAL_TABLET | Freq: Four times a day (QID) | ORAL | Status: DC | PRN

## 2015-08-07 NOTE — Progress Notes (Signed)
Pt for dc home today.  She has received RW and 3 in 1 for home.  [facility] will follow up with her within 24-48h post dc.    Quintella BatonJulie W. Jayliana Valencia, RN, BSN  Trauma/Neuro ICU Case Manager [phone]

## 2015-08-07 NOTE — Progress Notes (Signed)
Physical Therapy Treatment Patient Details Name: Jodi [name] MRN: [id number] DOB: 01/19/1976 Today's Date: 08/07/2015    History of Present Illness 39 yo female with onset of Crush injury by horse with the pelvic fracture (ORIF), closed; right sacral fracture and AP compression pattern; Lt L-2, L-3, L-4 transverse process fractures; Rt ankle sprain    PT Comments    Pt very motivated and determined. She is able to state back precautions and states she still has to use long sitting to pivot OOB as rolling is too painful. Pt continues to require cues with functional activities to maintain back precautions and verbalizes understanding. Pt with excellent improvement in gait and encouraged mobility. Will continue to follow.   Follow Up Recommendations  Home health PT;Supervision/Assistance - 24 hour     Equipment Recommendations       Recommendations for Other Services       Precautions / Restrictions Precautions Precautions: Fall;Back Precaution Comments: reviewed back precautions Restrictions RLE Weight Bearing: Partial weight bearing RLE Partial Weight Bearing Percentage or Pounds: 50    Mobility  Bed Mobility               General bed mobility comments: pt EOB on arrival  Transfers Overall transfer level: Modified independent                  Ambulation/Gait Ambulation/Gait assistance: Supervision Ambulation Distance (Feet): 400 Feet Assistive device: Rolling walker (2 wheeled) Gait Pattern/deviations: Step-to pattern;Trunk flexed   Gait velocity interpretation: Below normal speed for age/gender General Gait Details: cues for posture, position in RW . Good use of PWB throughout   Stairs            Wheelchair Mobility    Modified Rankin (Stroke Patients Only)       Balance                                    Cognition Arousal/Alertness: Awake/alert Behavior During Therapy: WFL for tasks assessed/performed Overall  Cognitive Status: Within Functional Limits for tasks assessed                      Exercises      General Comments        Pertinent Vitals/Pain Pain Score: 4  Pain Location: back Pain Descriptors / Indicators: Sore Pain Intervention(s): Limited activity within patient's tolerance    Home Living                      Prior Function            PT Goals (current goals can now be found in the care plan section) Progress towards PT goals: Progressing toward goals    Frequency       PT Plan Current plan remains appropriate    Co-evaluation             End of Session   Activity Tolerance: Patient tolerated treatment well Patient left: Other (comment) (at toilet with call bell and rN aware)     Time: [id number] PT Time Calculation (min) (ACUTE ONLY): 20 min  Charges:  $Gait Training: 8-22 mins                    G Codes:      Jodi [name], Jodi [name] 08/07/2015, 11:07 AM Jodi [name], PT [phone]

## 2015-08-07 NOTE — Discharge Summary (Signed)
[facility] Trauma Service Discharge Summary   Patient ID: Jodi [name] MRN: [id number] DOB/AGE: 1976-09-21 39 y.o.  Admit date: 07/28/2015 Discharge date: 08/07/2015  Discharge Diagnoses Patient Active Problem List   Diagnosis Date Noted  . Avulsion fracture of lateral malleolus of right fibula 08/07/2015  . Ileus ([facility]) 08/02/2015  . Fall from horse 07/30/2015  . Lumbar transverse process fracture ([facility]) 07/30/2015  . Sprain of ankle 07/30/2015  . Acute blood loss anemia 07/30/2015  . Pelvic ring fracture ([facility]) 07/28/2015    Consultants Dr. Myrene Galas (Ortho)  Procedures Dr. Carola Frost 07/30/15: 1. Iliosacral screw fixation right hemipelvis. 2. Open reduction and internal fixation of anterior pubic symphysis.   Hospital Course:  Jodi [name], 39 y/o AA female was riding a horse on a trail with a group of people. Her horse reared up and she fell off. The horse then fell on top of her. It laid across her pelvis area and her right ankle. She did not lose consciousness. Other members of the group assisted getting the horse off of her. She was not able to get up. She was transported by EMS as a level II trauma. Her workup has revealed pelvic fractures including right sacral fracture and AP compression pattern and left lumbar 2 through 4 transverse process fractures.  She was also noted to have a very small amount of pelvic free fluid.  Patient was admitted and underwent procedure listed above.  Tolerated procedure well and was transferred to the floor.  After surgery she c/o mostly of lower back pain as her pelvic pain had resolved.  The back pain has improved over the last few days, but after having several BM's her back is more sore, but well controlled with pain meds.  Denies paresthesias.  She experienced a post-operative colonic ileus which was treated with neostigmine and resolved prior to discharge.  Diet was advanced as tolerated.  ABL anemia stabilized.  She slowly  advanced with therapies.  She was also found to have a right ankle distal fibula avulsion fracture which was treated non-operatively.  Pain control was quite difficult and she was eventually controlled with Oxycontin, oral dilaudid, ultram, and robaxin.  On HD #11, the patient was voiding well, tolerating diet, ambulating well, pain well controlled, vital signs stable, incisions c/d/i and felt stable for discharge home.  Patient will follow up with Dr. Carola Frost in 10-14 days and knows to call with questions or concerns.  She is to be WBAT on left leg, partial WB on right leg (50%), no ROM restrictions.  Home therapies set up for her.  DME already delivered.  She will continue dressing changes PRN and ice PRN.  She will discharge home on Lovenox for a total of 3 weeks after surgery, then start Aspirin  BID.     Physical Exam: General: pleasant, WD/WN AA female who is laying in bed in NAD HEENT: head is normocephalic, atraumatic.   Heart: regular, rate, and rhythm.  Normal s1,s2. No obvious murmurs, gallops, or rubs noted.  Palpable radial and pedal pulses bilaterally Lungs: CTAB, no wheezes, rhonchi, or rales noted.  Respiratory effort nonlabored Abd: soft, NT/ND, +BS, no masses, hernias, or organomegaly MS: B/L LE edematous, distal CSM intact, no paresthesias.   Skin: warm and dry with no masses, lesions, or rashes Psych: A&Ox3 with an appropriate affect.     Medication List    STOP taking these medications        celecoxib 200 MG capsule  Commonly known  as:  CELEBREX      TAKE these medications        acetaminophen 325 MG tablet  Commonly known as:  TYLENOL  Take 2 tablets (650 mg total) by mouth every 4 (four) hours as needed for mild pain.     albuterol 108 (90 BASE) MCG/ACT inhaler  Commonly known as:  PROVENTIL HFA;VENTOLIN HFA  Inhale 1-2 puffs into the lungs every 6 (six) hours as needed for wheezing or shortness of breath.     arformoterol 15 MCG/2ML Nebu  Commonly known  as:  BROVANA  Take 15 mcg by nebulization 2 (two) times daily as needed (shortness of breath).     BREO ELLIPTA 100-25 MCG/INH Aepb  Generic drug:  Fluticasone Furoate-Vilanterol  Inhale 1 puff into the lungs daily.     desipramine 50 MG tablet  Commonly known as:  NORPRAMIN  Take 50 mg by mouth at bedtime.     docusate sodium 100 MG capsule  Commonly known as:  COLACE  Take 1 capsule (100 mg total) by mouth 2 (two) times daily.     enoxaparin 40 MG/0.4ML injection  Commonly known as:  LOVENOX  Inject 0.4 mLs (40 mg total) into the skin daily.     fluticasone 50 MCG/ACT nasal spray  Commonly known as:  FLONASE  Place 1 spray into both nostrils daily.     HYDROmorphone 4 MG tablet  Commonly known as:  DILAUDID  Take 1-2 tablets (4-8 mg total) by mouth every 4 (four) hours as needed (  for mild pain,  for moderate pain,  for severe pain).     ipratropium-albuterol 0.5-2.5 (3) MG/3ML Soln  Commonly known as:  DUONEB  Take 3 mLs by nebulization every 4 (four) hours as needed (shortness of breath).     levocetirizine 5 MG tablet  Commonly known as:  XYZAL  Take 5 mg by mouth every evening.     methocarbamol 750 MG tablet  Commonly known as:  ROBAXIN  Take 2 tablets (1,500 mg total) by mouth every 6 (six) hours as needed for muscle spasms.     montelukast 10 MG tablet  Commonly known as:  SINGULAIR  Take 10 mg by mouth daily.     omeprazole 20 MG capsule  Commonly known as:  PRILOSEC  Take 20 mg by mouth 2 (two) times daily before a meal.     OxyCODONE 20 mg T12a 12 hr tablet  Commonly known as:  OXYCONTIN  Take 1 tablet (20 mg total) by mouth every 12 (twelve) hours.     polyethylene glycol packet  Commonly known as:  MIRALAX / GLYCOLAX  Take 17 g by mouth daily.     ranitidine 150 MG tablet  Commonly known as:  ZANTAC  Take 150 mg by mouth daily.     traMADol 50 MG tablet  Commonly known as:  ULTRAM  Take 1-2 tablets (50-100 mg total) by mouth every 8  (eight) hours as needed for moderate pain.     traMADol 50 MG tablet  Commonly known as:  ULTRAM  Take 2 tablets (100 mg total) by mouth every 6 (six) hours.         Follow-up Information    Follow up with HANDY,MICHAEL H, MD. Schedule an appointment as soon as possible for a visit in 10 days.   Specialty:  Orthopedic Surgery   Why:  For suture removal, For wound re-check   Contact information:   [address]  [place] [address] [phone]       [facility].   Why:  As needed.  office location [address]   Contact information:   [address] [id number] [address] [id number] [phone]      Signed: Nonie HoyerMegan N. Quaron Delacruz, [facility]  Trauma Service [phone]  08/07/2015, 9:44 AM

## 2015-08-08 ENCOUNTER — Encounter: Payer: Self-pay | Admitting: Family Medicine

## 2015-08-13 ENCOUNTER — Ambulatory Visit: Payer: 59 | Admitting: Family Medicine

## 2015-08-14 ENCOUNTER — Ambulatory Visit: Payer: Self-pay | Admitting: Allergy and Immunology

## 2015-08-29 ENCOUNTER — Other Ambulatory Visit: Payer: Self-pay | Admitting: Family Medicine

## 2015-09-04 ENCOUNTER — Encounter (HOSPITAL_COMMUNITY)
Admission: RE | Disposition: A | Discharge status: Home / Self Care | Payer: Self-pay | Source: Ambulatory Visit | Attending: Orthopedic Surgery

## 2015-09-04 ENCOUNTER — Inpatient Hospital Stay (HOSPITAL_COMMUNITY): Payer: 59

## 2015-09-04 ENCOUNTER — Encounter (HOSPITAL_COMMUNITY): Payer: Self-pay | Admitting: *Deleted

## 2015-09-04 ENCOUNTER — Inpatient Hospital Stay (HOSPITAL_COMMUNITY): Payer: 59 | Admitting: Anesthesiology

## 2015-09-04 ENCOUNTER — Ambulatory Visit (HOSPITAL_COMMUNITY)
Admission: RE | Admit: 2015-09-04 | Discharge: 2015-09-06 | Disposition: A | Discharge status: Home / Self Care | Payer: 59 | Source: Ambulatory Visit | Attending: Orthopedic Surgery | Admitting: Orthopedic Surgery

## 2015-09-04 DIAGNOSIS — G894 Chronic pain syndrome: Secondary | ICD-10-CM | POA: Diagnosis not present

## 2015-09-04 DIAGNOSIS — W5519XA Other contact with horse, initial encounter: Secondary | ICD-10-CM | POA: Insufficient documentation

## 2015-09-04 DIAGNOSIS — Z888 Allergy status to other drugs, medicaments and biological substances status: Secondary | ICD-10-CM | POA: Diagnosis not present

## 2015-09-04 DIAGNOSIS — Z23 Encounter for immunization: Secondary | ICD-10-CM | POA: Insufficient documentation

## 2015-09-04 DIAGNOSIS — Y9352 Activity, horseback riding: Secondary | ICD-10-CM | POA: Insufficient documentation

## 2015-09-04 DIAGNOSIS — S32810A Multiple fractures of pelvis with stable disruption of pelvic ring, initial encounter for closed fracture: Secondary | ICD-10-CM | POA: Diagnosis present

## 2015-09-04 DIAGNOSIS — J309 Allergic rhinitis, unspecified: Secondary | ICD-10-CM | POA: Diagnosis present

## 2015-09-04 DIAGNOSIS — Y998 Other external cause status: Secondary | ICD-10-CM | POA: Diagnosis not present

## 2015-09-04 DIAGNOSIS — M532X8 Spinal instabilities, sacral and sacrococcygeal region: Principal | ICD-10-CM | POA: Insufficient documentation

## 2015-09-04 DIAGNOSIS — Z87891 Personal history of nicotine dependence: Secondary | ICD-10-CM | POA: Diagnosis not present

## 2015-09-04 DIAGNOSIS — J45991 Cough variant asthma: Secondary | ICD-10-CM | POA: Diagnosis not present

## 2015-09-04 DIAGNOSIS — K589 Irritable bowel syndrome without diarrhea: Secondary | ICD-10-CM | POA: Diagnosis not present

## 2015-09-04 DIAGNOSIS — D573 Sickle-cell trait: Secondary | ICD-10-CM | POA: Diagnosis not present

## 2015-09-04 DIAGNOSIS — J45909 Unspecified asthma, uncomplicated: Secondary | ICD-10-CM | POA: Diagnosis present

## 2015-09-04 DIAGNOSIS — Z419 Encounter for procedure for purposes other than remedying health state, unspecified: Secondary | ICD-10-CM

## 2015-09-04 DIAGNOSIS — E785 Hyperlipidemia, unspecified: Secondary | ICD-10-CM | POA: Diagnosis not present

## 2015-09-04 DIAGNOSIS — K219 Gastro-esophageal reflux disease without esophagitis: Secondary | ICD-10-CM | POA: Insufficient documentation

## 2015-09-04 DIAGNOSIS — Y9289 Other specified places as the place of occurrence of the external cause: Secondary | ICD-10-CM | POA: Insufficient documentation

## 2015-09-04 DIAGNOSIS — E559 Vitamin D deficiency, unspecified: Secondary | ICD-10-CM | POA: Diagnosis present

## 2015-09-04 DIAGNOSIS — M533 Sacrococcygeal disorders, not elsewhere classified: Secondary | ICD-10-CM | POA: Diagnosis present

## 2015-09-04 DIAGNOSIS — Z86718 Personal history of other venous thrombosis and embolism: Secondary | ICD-10-CM | POA: Diagnosis not present

## 2015-09-04 DIAGNOSIS — L309 Dermatitis, unspecified: Secondary | ICD-10-CM | POA: Insufficient documentation

## 2015-09-04 HISTORY — PX: SACRO-ILIAC PINNING: SHX5050

## 2015-09-04 SURGERY — PINNING, SACROILIAC JOINT, PERCUTANEOUS
Anesthesia: General | Site: Hip | Laterality: Left

## 2015-09-04 MED ORDER — METHOCARBAMOL 500 MG PO TABS
ORAL_TABLET | ORAL | Status: AC
  Filled 2015-09-04: qty 1

## 2015-09-04 MED ORDER — DEXTROSE 5 % IV SOLN
500.0000 mg | Freq: Four times a day (QID) | INTRAVENOUS | Status: DC
  Filled 2015-09-04 (×8): qty 5

## 2015-09-04 MED ORDER — CEFAZOLIN SODIUM-DEXTROSE 2-3 GM-% IV SOLR
2.0000 g | Freq: Four times a day (QID) | INTRAVENOUS | Status: AC
  Administered 2015-09-04: 2 g via INTRAVENOUS
  Filled 2015-09-04: qty 50

## 2015-09-04 MED ORDER — PROPOFOL 10 MG/ML IV BOLUS
INTRAVENOUS | Status: AC
  Filled 2015-09-04: qty 20

## 2015-09-04 MED ORDER — HYDROMORPHONE HCL 1 MG/ML IJ SOLN
INTRAMUSCULAR | Status: AC
  Filled 2015-09-04: qty 1

## 2015-09-04 MED ORDER — GLYCOPYRROLATE 0.2 MG/ML IJ SOLN
INTRAMUSCULAR | Status: DC | PRN
  Administered 2015-09-04: .5 mg via INTRAVENOUS

## 2015-09-04 MED ORDER — LACTATED RINGERS IV SOLN
INTRAVENOUS | Status: DC
  Administered 2015-09-04: 15:00:00 via INTRAVENOUS

## 2015-09-04 MED ORDER — BUPIVACAINE-EPINEPHRINE (PF) 0.5% -1:200000 IJ SOLN
INTRAMUSCULAR | Status: AC
  Filled 2015-09-04: qty 30

## 2015-09-04 MED ORDER — ROCURONIUM BROMIDE 100 MG/10ML IV SOLN
INTRAVENOUS | Status: DC | PRN
  Administered 2015-09-04: 30 mg via INTRAVENOUS

## 2015-09-04 MED ORDER — OXYCODONE HCL 5 MG PO TABS
ORAL_TABLET | ORAL | Status: AC
  Filled 2015-09-04: qty 1

## 2015-09-04 MED ORDER — 0.9 % SODIUM CHLORIDE (POUR BTL) OPTIME
TOPICAL | Status: DC | PRN
  Administered 2015-09-04: 1000 mL

## 2015-09-04 MED ORDER — PROMETHAZINE HCL 25 MG/ML IJ SOLN: 6.2500 mg | INTRAMUSCULAR | Status: DC | PRN

## 2015-09-04 MED ORDER — ACETAMINOPHEN 325 MG PO TABS: 650.0000 mg | ORAL_TABLET | Freq: Four times a day (QID) | ORAL | Status: DC | PRN

## 2015-09-04 MED ORDER — HYDROMORPHONE HCL 1 MG/ML IJ SOLN
1.0000 mg | INTRAMUSCULAR | Status: DC | PRN
  Administered 2015-09-05 (×2): 1 mg via INTRAVENOUS
  Filled 2015-09-04 (×2): qty 1

## 2015-09-04 MED ORDER — HYDROMORPHONE HCL 1 MG/ML IJ SOLN
0.2500 mg | INTRAMUSCULAR | Status: DC | PRN
  Administered 2015-09-04 (×4): 0.5 mg via INTRAVENOUS

## 2015-09-04 MED ORDER — SODIUM CHLORIDE 0.9 % IV SOLN
INTRAVENOUS | Status: DC | PRN
  Administered 2015-09-04: 20 mL via INTRAMUSCULAR

## 2015-09-04 MED ORDER — OXYCODONE-ACETAMINOPHEN 5-325 MG PO TABS
1.0000 | ORAL_TABLET | Freq: Four times a day (QID) | ORAL | Status: DC | PRN
  Administered 2015-09-04 – 2015-09-05 (×3): 2 via ORAL
  Filled 2015-09-04 (×3): qty 2

## 2015-09-04 MED ORDER — OXYCODONE HCL 5 MG PO TABS
5.0000 mg | ORAL_TABLET | ORAL | Status: DC | PRN
  Administered 2015-09-05: 15 mg via ORAL
  Administered 2015-09-05: 10 mg via ORAL
  Administered 2015-09-05 – 2015-09-06 (×8): 15 mg via ORAL
  Filled 2015-09-04 (×6): qty 3
  Filled 2015-09-04: qty 2
  Filled 2015-09-04 (×3): qty 3

## 2015-09-04 MED ORDER — METHOCARBAMOL 500 MG PO TABS
1000.0000 mg | ORAL_TABLET | Freq: Four times a day (QID) | ORAL | Status: DC
  Administered 2015-09-04 – 2015-09-06 (×6): 1000 mg via ORAL
  Filled 2015-09-04 (×6): qty 2

## 2015-09-04 MED ORDER — HYDROMORPHONE HCL 4 MG PO TABS: 4.0000 mg | ORAL_TABLET | ORAL | Status: DC | PRN

## 2015-09-04 MED ORDER — HYDROMORPHONE HCL 1 MG/ML IJ SOLN
0.2500 mg | INTRAMUSCULAR | Status: DC | PRN
  Administered 2015-09-04 (×6): 0.5 mg via INTRAVENOUS

## 2015-09-04 MED ORDER — ASPIRIN EC 325 MG PO TBEC: 325.0000 mg | DELAYED_RELEASE_TABLET | Freq: Two times a day (BID) | ORAL | Status: DC

## 2015-09-04 MED ORDER — ONDANSETRON HCL 4 MG/2ML IJ SOLN: 4.0000 mg | Freq: Four times a day (QID) | INTRAMUSCULAR | Status: DC | PRN

## 2015-09-04 MED ORDER — LIDOCAINE HCL (CARDIAC) 20 MG/ML IV SOLN
INTRAVENOUS | Status: AC
  Filled 2015-09-04: qty 5

## 2015-09-04 MED ORDER — OXYCODONE HCL 5 MG PO TABS: 5.0000 mg | ORAL_TABLET | Freq: Four times a day (QID) | ORAL | Status: DC | PRN

## 2015-09-04 MED ORDER — METHOCARBAMOL 1000 MG/10ML IJ SOLN
500.0000 mg | Freq: Once | INTRAVENOUS | Status: AC
  Administered 2015-09-04: 500 mg via INTRAVENOUS

## 2015-09-04 MED ORDER — LIDOCAINE HCL (CARDIAC) 20 MG/ML IV SOLN
INTRAVENOUS | Status: DC | PRN
  Administered 2015-09-04: 80 mg via INTRAVENOUS

## 2015-09-04 MED ORDER — OXYCODONE HCL 5 MG/5ML PO SOLN: 5.0000 mg | Freq: Once | ORAL | Status: AC | PRN

## 2015-09-04 MED ORDER — OXYCODONE HCL 5 MG PO TABS
5.0000 mg | ORAL_TABLET | Freq: Once | ORAL | Status: AC | PRN
  Administered 2015-09-04: 5 mg via ORAL

## 2015-09-04 MED ORDER — INFLUENZA VAC SPLIT QUAD 0.5 ML IM SUSY
0.5000 mL | PREFILLED_SYRINGE | INTRAMUSCULAR | Status: AC
  Administered 2015-09-05: 0.5 mL via INTRAMUSCULAR
  Filled 2015-09-04: qty 0.5

## 2015-09-04 MED ORDER — OXYCODONE HCL 5 MG PO CAPS: 5.0000 mg | ORAL_CAPSULE | ORAL | Status: DC | PRN

## 2015-09-04 MED ORDER — FENTANYL CITRATE (PF) 250 MCG/5ML IJ SOLN
INTRAMUSCULAR | Status: AC
  Filled 2015-09-04: qty 5

## 2015-09-04 MED ORDER — MIDAZOLAM HCL 5 MG/5ML IJ SOLN
INTRAMUSCULAR | Status: DC | PRN
  Administered 2015-09-04: 2 mg via INTRAVENOUS

## 2015-09-04 MED ORDER — METOCLOPRAMIDE HCL 5 MG PO TABS: 5.0000 mg | ORAL_TABLET | Freq: Three times a day (TID) | ORAL | Status: DC | PRN

## 2015-09-04 MED ORDER — MIDAZOLAM HCL 2 MG/2ML IJ SOLN
INTRAMUSCULAR | Status: AC
  Filled 2015-09-04: qty 2

## 2015-09-04 MED ORDER — PROPOFOL 10 MG/ML IV BOLUS
INTRAVENOUS | Status: DC | PRN
  Administered 2015-09-04: 150 mg via INTRAVENOUS
  Administered 2015-09-04: 50 mg via INTRAVENOUS

## 2015-09-04 MED ORDER — ACETAMINOPHEN 650 MG RE SUPP: 650.0000 mg | Freq: Four times a day (QID) | RECTAL | Status: DC | PRN

## 2015-09-04 MED ORDER — ONDANSETRON HCL 4 MG PO TABS: 4.0000 mg | ORAL_TABLET | Freq: Four times a day (QID) | ORAL | Status: DC | PRN

## 2015-09-04 MED ORDER — LACTATED RINGERS IV SOLN
INTRAVENOUS | Status: DC | PRN
  Administered 2015-09-04 (×2): via INTRAVENOUS

## 2015-09-04 MED ORDER — SUCCINYLCHOLINE CHLORIDE 20 MG/ML IJ SOLN
INTRAMUSCULAR | Status: AC
  Filled 2015-09-04: qty 1

## 2015-09-04 MED ORDER — CEFAZOLIN SODIUM-DEXTROSE 2-3 GM-% IV SOLR
2.0000 g | Freq: Once | INTRAVENOUS | Status: AC
  Administered 2015-09-04: 2 g via INTRAVENOUS

## 2015-09-04 MED ORDER — ONDANSETRON HCL 4 MG/2ML IJ SOLN
INTRAMUSCULAR | Status: DC | PRN
  Administered 2015-09-04: 4 mg via INTRAVENOUS

## 2015-09-04 MED ORDER — METOCLOPRAMIDE HCL 5 MG/ML IJ SOLN: 5.0000 mg | Freq: Three times a day (TID) | INTRAMUSCULAR | Status: DC | PRN

## 2015-09-04 MED ORDER — POTASSIUM CHLORIDE IN NACL 20-0.9 MEQ/L-% IV SOLN: INTRAVENOUS | Status: DC

## 2015-09-04 MED ORDER — TRAMADOL HCL 50 MG PO TABS: 50.0000 mg | ORAL_TABLET | Freq: Three times a day (TID) | ORAL | Status: DC | PRN

## 2015-09-04 MED ORDER — FENTANYL CITRATE (PF) 100 MCG/2ML IJ SOLN
INTRAMUSCULAR | Status: DC | PRN
  Administered 2015-09-04 (×2): 100 ug via INTRAVENOUS
  Administered 2015-09-04: 50 ug via INTRAVENOUS
  Administered 2015-09-04: 100 ug via INTRAVENOUS
  Administered 2015-09-04 (×3): 50 ug via INTRAVENOUS

## 2015-09-04 MED ORDER — NEOSTIGMINE METHYLSULFATE 10 MG/10ML IV SOLN
INTRAVENOUS | Status: DC | PRN
  Administered 2015-09-04: 3 mg via INTRAVENOUS

## 2015-09-04 MED ORDER — CEFAZOLIN SODIUM-DEXTROSE 2-3 GM-% IV SOLR
INTRAVENOUS | Status: AC
  Filled 2015-09-04: qty 50

## 2015-09-04 SURGICAL SUPPLY — 45 items
BLADE SURG 15 STRL LF DISP TIS (BLADE) ×1 IMPLANT
BLADE SURG 15 STRL SS (BLADE) ×2
BRUSH SCRUB DISP (MISCELLANEOUS) ×4 IMPLANT
COVER SURGICAL LIGHT HANDLE (MISCELLANEOUS) ×4 IMPLANT
DRAPE C-ARM 42X72 X-RAY (DRAPES) ×1 IMPLANT
DRAPE C-ARMOR (DRAPES) ×2 IMPLANT
DRAPE INCISE IOBAN 66X45 STRL (DRAPES) ×2 IMPLANT
DRAPE LAPAROTOMY TRNSV 102X78 (DRAPE) ×2 IMPLANT
DRAPE U-SHAPE 47X51 STRL (DRAPES) ×2 IMPLANT
DRSG MEPILEX BORDER 4X4 (GAUZE/BANDAGES/DRESSINGS) ×1 IMPLANT
DRSG PAD ABDOMINAL 8X10 ST (GAUZE/BANDAGES/DRESSINGS) ×4 IMPLANT
ELECT REM PT RETURN 9FT ADLT (ELECTROSURGICAL) ×2
ELECTRODE REM PT RTRN 9FT ADLT (ELECTROSURGICAL) ×1 IMPLANT
GAUZE SPONGE 4X4 12PLY STRL (GAUZE/BANDAGES/DRESSINGS) ×2 IMPLANT
GAUZE XEROFORM 5X9 LF (GAUZE/BANDAGES/DRESSINGS) ×2 IMPLANT
GLOVE BIO SURGEON STRL SZ7.5 (GLOVE) ×2 IMPLANT
GLOVE BIO SURGEON STRL SZ8 (GLOVE) ×2 IMPLANT
GLOVE BIOGEL PI IND STRL 7.5 (GLOVE) ×1 IMPLANT
GLOVE BIOGEL PI IND STRL 8 (GLOVE) ×1 IMPLANT
GLOVE BIOGEL PI INDICATOR 7.5 (GLOVE) ×1
GLOVE BIOGEL PI INDICATOR 8 (GLOVE) ×1
GOWN STRL REUS W/ TWL LRG LVL3 (GOWN DISPOSABLE) ×2 IMPLANT
GOWN STRL REUS W/ TWL XL LVL3 (GOWN DISPOSABLE) ×1 IMPLANT
GOWN STRL REUS W/TWL LRG LVL3 (GOWN DISPOSABLE) ×4
GOWN STRL REUS W/TWL XL LVL3 (GOWN DISPOSABLE) ×2
GUIDEPIN 2.8X300 THRD TIP OIC (Screw) ×2 IMPLANT
KIT BASIN OR (CUSTOM PROCEDURE TRAY) ×2 IMPLANT
KIT ROOM TURNOVER OR (KITS) ×2 IMPLANT
MANIFOLD NEPTUNE II (INSTRUMENTS) ×1 IMPLANT
NDL SPNL 22GX3.5 QUINCKE BK (NEEDLE) IMPLANT
NEEDLE SPNL 22GX3.5 QUINCKE BK (NEEDLE) ×2 IMPLANT
NS IRRIG 1000ML POUR BTL (IV SOLUTION) ×2 IMPLANT
PACK GENERAL/GYN (CUSTOM PROCEDURE TRAY) ×2 IMPLANT
PAD ARMBOARD 7.5X6 YLW CONV (MISCELLANEOUS) ×4 IMPLANT
SCREW CANN 7.3X125 32MM THRD (Screw) ×1 IMPLANT
SCREW CANN 7.3X145 32MM THRD (Screw) ×1 IMPLANT
STAPLER VISISTAT 35W (STAPLE) ×1 IMPLANT
SUT ETHILON 3 0 PS 1 (SUTURE) ×2 IMPLANT
SUT VIC AB 2-0 FS1 27 (SUTURE) ×2 IMPLANT
SYRINGE 10CC LL (SYRINGE) ×1 IMPLANT
TOWEL OR 17X24 6PK STRL BLUE (TOWEL DISPOSABLE) ×2 IMPLANT
TOWEL OR 17X26 10 PK STRL BLUE (TOWEL DISPOSABLE) ×4 IMPLANT
UNDERPAD 30X30 INCONTINENT (UNDERPADS AND DIAPERS) ×2 IMPLANT
WASHER OIC 13MM 6 PACK (Screw) ×2 IMPLANT
WATER STERILE IRR 1000ML POUR (IV SOLUTION) ×2 IMPLANT

## 2015-09-04 NOTE — Anesthesia Preprocedure Evaluation (Addendum)
Anesthesia Evaluation  Patient identified by MRN, date of birth, ID band Patient awake    Reviewed: Allergy & Precautions, H&P , NPO status , Patient's Chart, lab work & pertinent test results  History of Anesthesia Complications Negative for: history of anesthetic complications  Airway Mallampati: II  TM Distance: >3 FB Neck ROM: full    Dental no notable dental hx.    Pulmonary asthma , former smoker,    Pulmonary exam normal breath sounds clear to auscultation       Cardiovascular negative cardio ROS Normal cardiovascular exam Rhythm:regular Rate:Normal     Neuro/Psych Chronic pain negative neurological ROS     GI/Hepatic Neg liver ROS, GERD  ,  Endo/Other  negative endocrine ROS  Renal/GU negative Renal ROS     Musculoskeletal  (+) Arthritis ,   Abdominal   Peds  Hematology  (+) anemia , DVT 10 years ago   Anesthesia Other Findings   Reproductive/Obstetrics negative OB ROS                            Anesthesia Physical Anesthesia Plan  ASA: II  Anesthesia Plan: General   Post-op Pain Management:    Induction: Intravenous  Airway Management Planned: Oral ETT  Additional Equipment:   Intra-op Plan:   Post-operative Plan: Extubation in OR  Informed Consent: I have reviewed the patients History and Physical, chart, labs and discussed the procedure including the risks, benefits and alternatives for the proposed anesthesia with the patient or authorized representative who has indicated his/her understanding and acceptance.   Dental Advisory Given  Plan Discussed with: Anesthesiologist, CRNA and Surgeon  Anesthesia Plan Comments:         Anesthesia Quick Evaluation

## 2015-09-04 NOTE — H&P (Signed)
Jodi [name] is an 39 y.o. female.   Chief Complaint:  Left SI joint pain HPI: Injured approximately 5 weeks ago when a horse fell on her.  CT scan demonstrated right sided sacral comminution; left side appeared to show no significant evidence of injury.  Patient has experienced pain that has now localized to the left SI joint which has been persistent and now associated with perceptable instability.  Right sided pain has resolved.  NO bowel or bladder symptoms.  Past Medical History  Diagnosis Date  . Asthma     Cough variant  . DVT (deep venous thrombosis) (HCC) 10 yrs ago  . Asthma   . Allergic rhinitis   . Hyperlipidemia   . Shortness of breath     SOB even without exertion at times  . GERD (gastroesophageal reflux disease)   . IBS (irritable bowel syndrome)   . 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 Anemia   . Eczema   . Abdominal pain, chronic, right upper quadrant   . Chronic pain syndrome 06/08/2013  . Dyslipidemia 08/11/2012  . Acute recurrent maxillary sinusitis 11/04/2014  . Asthma   . Back pain   . Sickle-cell trait [facility])     Past Surgical History  Procedure Laterality Date  . Blood clot removed from lower abdomen  2000  . Cyst removed from ovary and fluid removed from tubes  2000  . Partial hysterectomy  2009  . Esophagogastroduodenoscopy  10/22/2012    Procedure: ESOPHAGOGASTRODUODENOSCOPY (EGD);  Surgeon: Malissa Hippo, MD;  Location: [facility];  Service: Endoscopy;  Laterality: N/A;  100  . Colonoscopy  11/04/2012    Procedure: COLONOSCOPY;  Surgeon: Malissa Hippo, MD;  Location: [facility];  Service: Endoscopy;  Laterality: N/A;  915  . Abdominal hysterectomy  4-5 yrs ago  . Tubal ligation  1999  . Cholecystectomy  11/12/2012    Procedure: LAPAROSCOPIC CHOLECYSTECTOMY;  Surgeon: Dalia Heading, MD;  Location: [facility] ORS;  Service: General;  Laterality: N/A;  . Cholecystectomy    . Abdominal hysterectomy    . Sacro-iliac pinning Right  07/30/2015    Procedure: Loyal Gambler;  Surgeon: Myrene Galas, MD;  Location: [facility] OR;  Service: Orthopedics;  Laterality: Right;  . Orif pelvic fracture N/A 07/30/2015    Procedure: OPEN REDUCTION INTERNAL FIXATION (ORIF) PELVIC FRACTURE;  Surgeon: Myrene Galas, MD;  Location: [facility] OR;  Service: Orthopedics;  Laterality: N/A;    Family History  Problem Relation Age of Onset  . Asthma Mother   . Hypertension Mother   . Bronchiolitis Mother   . Hypertension Father   . Hyperlipidemia Father     recurrent rectum polyps   . Diabetes Sister   . Lupus Sister   . Depression Sister    Social History:  reports that she quit smoking about 6 years ago. Her smoking use included Cigarettes. She has a 2.5 pack-year smoking history. She has never used smokeless tobacco. She reports that she does not drink alcohol or use illicit drugs.  Allergies:  Allergies  Allergen Reactions  . Compazine Other (See Comments)    Face swells, tongue sticks out  . Compazine [Prochlorperazine]     Can not control facial muscles    Medications Prior to Admission  Medication Sig Dispense Refill  . acetaminophen (TYLENOL) 325 MG tablet Take 2 tablets (650 mg total) by mouth every 4 (four) hours as needed for mild pain.    Marland Kitchen  albuterol (PROVENTIL HFA;VENTOLIN HFA) 108 (90 BASE) MCG/ACT inhaler Inhale 2 puffs into the lungs every 6 (six) hours as needed for wheezing or shortness of breath. 18 g 0  . arformoterol (BROVANA) 15 MCG/2ML NEBU Take 15 mcg by nebulization 2 (two) times daily.    Marland Kitchen desipramine (NORPRAMIN) 50 MG tablet Take 50 mg by mouth at bedtime.    . docusate sodium (COLACE) 100 MG capsule Take 1 capsule (100 mg total) by mouth 2 (two) times daily. (Patient taking differently: Take 100 mg by mouth 2 (two) times daily. As needed) 60 capsule 0  . enoxaparin (LOVENOX) 40 MG/0.4ML injection Inject 0.4 mLs (40 mg total) into the skin daily. 17 Syringe 0  . fluticasone (FLONASE) 50 MCG/ACT nasal spray USE  TWO SPRAY(S) IN EACH NOSTRIL ONCE DAILY 16 g 3  . Fluticasone Furoate-Vilanterol 100-25 MCG/INH AEPB Inhale 1 puff into the lungs daily.    Marland Kitchen HYDROmorphone (DILAUDID) 4 MG tablet Take 1-2 tablets (4-8 mg total) by mouth every 4 (four) hours as needed (  for mild pain,  for moderate pain,  for severe pain). 50 tablet 0  . ibuprofen (ADVIL,MOTRIN) 800 MG tablet Take 1 tablet (800 mg total) by mouth every 8 (eight) hours as needed for mild pain. 30 tablet 0  . ipratropium-albuterol (DUONEB) 0.5-2.5 (3) MG/3ML SOLN Take 3 mLs by nebulization every 6 (six) hours as needed (shortness of breath).     Marland Kitchen levocetirizine (XYZAL) 5 MG tablet Take 5 mg by mouth every evening.    . methocarbamol (ROBAXIN) 750 MG tablet Take 2 tablets (1,500 mg total) by mouth every 6 (six) hours as needed for muscle spasms. 40 tablet 0  . montelukast (SINGULAIR) 10 MG tablet TAKE ONE TABLET BY MOUTH ONCE DAILY IN THE MORNING 30 tablet 3  . omeprazole (PRILOSEC) 20 MG capsule Take 1 capsule (20 mg total) by mouth daily. 30 capsule 5  . oxycodone (OXY-IR) 5 MG capsule Take 5 mg by mouth every 4 (four) hours as needed for pain. Patient states it is the IR    . polyethylene glycol (MIRALAX / GLYCOLAX) packet Take 17 g by mouth daily. 30 each 0  . ranitidine (ZANTAC) 150 MG tablet TAKE ONE TABLET BY MOUTH TWICE DAILY 60 tablet 3  . traMADol (ULTRAM) 50 MG tablet Take 1-2 tablets (50-100 mg total) by mouth every 8 (eight) hours as needed for moderate pain. 50 tablet 0  . levocetirizine (XYZAL) 5 MG tablet TAKE ONE TABLET BY MOUTH ONCE DAILY IN THE EVENING (Patient not taking: Reported on 09/04/2015) 30 tablet 3  . OxyCODONE (OXYCONTIN) 20 mg T12A 12 hr tablet Take 1 tablet (20 mg total) by mouth every 12 (twelve) hours. (Patient not taking: Reported on 09/04/2015) 40 tablet 0  . predniSONE (STERAPRED UNI-PAK 21 TAB) 5 MG (21) TBPK tablet Take 1 tablet (5 mg total) by mouth as directed. Use as directed (Patient not taking:  Reported on 09/04/2015) 21 tablet 0  . traMADol (ULTRAM) 50 MG tablet Take 2 tablets (100 mg total) by mouth every 6 (six) hours. (Patient not taking: Reported on 09/04/2015) 40 tablet 0  . Vitamin D, Ergocalciferol, (DRISDOL) 50000 UNITS CAPS capsule Take 1 capsule (50,000 Units total) by mouth every 7 (seven) days. (Patient not taking: Reported on 09/04/2015) 4 capsule 5    No results found for this or any previous visit (from the past 48 hour(s)). No results found.  ROS No recent fever, bleeding abnormalities, urologic dysfunction, GI problems, or weight  gain.  Blood pressure 136/83, pulse 102, temperature 98.6 F (37 C), temperature source Oral, resp. rate 18, height 5\' 6"  (1.676 m), weight 170 lb (77.111 kg), last menstrual period 05/17/2012, SpO2 99 %. Physical Exam  RRR CTA Abd soft, NT Tender left SI joint posteriorly LLE Sens DPN, SPN, TN intact  Motor EHL, ext, flex, evers intact5  DP 2+, PT 2+, No significant edema   Assessment/Plan Left SI joint instability following pelvic ring injury and   Plan for transsacral screw fixation given subacute presentation Possible revision of right SI screw to transsacral  I discussed with the patient the risks and benefits of surgery, including the possibility of infection, nerve injury, vessel injury, wound breakdown, arthritis, symptomatic hardware, DVT/ PE, loss of motion, and need for further surgery among others.  We also specifically discussed the risk of foot drop. She understood these risks and wished to proceed.   Myrene GalasMichael Starsha Morning, MD [facility] [phone] (p)   09/04/2015, 5:29 PM

## 2015-09-04 NOTE — Op Note (Signed)
NAME:  Jodi [name], Jodi [name] NO.:  [id number]  MEDICAL RECORD NO.:  [id number]  LOCATION:  [id number]                        FACILITY:  [facility]  PHYSICIAN:  Doralee Albino. Carola Frost, M.D. DATE OF BIRTH:  09-26-1976  DATE OF PROCEDURE:  09/04/2015 DATE OF DISCHARGE:                              OPERATIVE REPORT   PREOPERATIVE DIAGNOSIS:  Left SI joint instability.  POSTOPERATIVE DIAGNOSIS:  Left SI joint instability.  PROCEDURE: 1. Transsacral screw fixation, left and right S1. 2. Transsacral screw fixation, left and right S2.  SURGEON:  Doralee Albino. Carola Frost, M.D.  ASSISTANT:  Mearl Latin, PA-C.  ANESTHESIA:  General.  COMPLICATIONS:  None.  EBL:  Scant.  DISPOSITION:  To PACU.  CONDITION:  Stable.  BRIEF SUMMARY OF INDICATION FOR PROCEDURE:  Jodi [name] is a very pleasant, 39 year old female, who was crushed by a horse and sustained a pelvic ring injury.  Plain films and CT scan at that time demonstrated slight loss of symmetry at the anterior symphysis as well as a right sacral ala fracture.  No injury to the left SI joint was identified at that time.  The patient underwent repair of her anterior symphysis as well as right SI screw fixation, but continued to complain of vague pelvic pain which coalesced around the left SI joint.  This pain never decreased and eventually became associated with the perception of instability with clicking at the joint.  I discussed with her the likelihood of a left SI joint injury and recommended iliosacral screw fixation.  Furthermore, we discussed that because of the subacute presentation, now 5-6 weeks out from injury that a transsacral screw fixation extending across both the left and right side through the vertebral bodies would be the most reliable form of fixation. Furthermore, the patient had developed some slight lucency around the right SI screw, and this would serve to augment that fixation as well. These risks included,  both left and right foot drop, potential with injury to the L5 nerve root as well as the nerve vessel injury, infection, or symptomatic hardware that may require eventual removal. We also discussed DVT, PE, and other anesthetic complications.  The patient acknowledged these risks and strongly wished to proceed with surgical stabilization.  BRIEF SUMMARY OF PROCEDURE:  [name] was given antibiotics preoperatively and taken to the operating room, where general anesthesia was induced.  She was positioned supine on a radiolucent table and the C- arm was brought in to confirm that we could obtain all the appropriate images.  We began the lateral making sure that a perfect lateral could be obtained and fine tuning this for placement of the S1 and S2 screws. A standard prep and drape was then performed.  A time-out held.  [name], PA-C, assisted me with positioning and obtaining the appropriate images for instrumentation.  A small stab incision was then made and a wire for this 7.3 cannulated screws advanced to the ischium.  It was tapped in place with a mallet after confirming position, and in similar fashion, the S2 iliosacral screw guide pin was perfectly positioned on the lateral and engaged into the near cortex.  This held [name] and then tapped across.  We then went to inlet and outlet images to confirm that this was posterior to the sacral ala and free of the S1 an S2 foramina on the outlet as well as disc spaces.  We then driven across to the far SI joint and checked under fluoro.  This followed measuring and placement of 2 screws with washers.  These measured 145 mm in length and 125 mm in length.  Final images showed appropriate reduction, hardware placement, trajectory and length.  The wound was irrigated and then Marcaine with epinephrine injected into the skin and long screw track deep tissues.  Wound was irrigated thoroughly and closed with 3-0 nylon. Sterile gently  compressive dressing was applied.  The patient was then awakened from anesthesia and transported to the PACU in stable condition.  PROGNOSIS:  The patient will be weightbearing as tolerated.  We will write for transfers.  She will be nonweightbearing on the left, for the next 6-8 weeks with a probable weightbearing on the right beginning in a 2-3 weeks.  At this time, we anticipate discharge to home if she remained active.  Anticipate to continue to do so and she strongly wishes to avoid another round of Lovenox and is out of the acute phase of injury, we will use aspirin for DVT prophylaxis.  We will plan to see back in the office in 10 days for removal of sutures.     Doralee AlbinoMichael H. Carola FrostHandy, M.D.     [facility]/MEDQ  D:  09/04/2015  T:  09/04/2015  Job:  [id number]

## 2015-09-04 NOTE — Progress Notes (Signed)
Pt called and informed to be here at 3pm. Voiced understanding.

## 2015-09-04 NOTE — Transfer of Care (Signed)
Immediate Anesthesia Transfer of Care Note  Patient: Jodi [name]  Procedure(s) Performed: Procedure(s): LEFT TO RIGHT TRANS SACRO-ILIAC SCREW (Left)  Patient Location: PACU  Anesthesia Type:General  Level of Consciousness: awake, alert  and patient cooperative  Airway & Oxygen Therapy: Patient Spontanous Breathing and Patient connected to nasal cannula oxygen  Post-op Assessment: Report given to RN and Post -op Vital signs reviewed and stable  Post vital signs: Reviewed and stable  Last Vitals:  Filed Vitals:   09/04/15 1459 09/04/15 1922  BP: 136/83 138/73  Pulse: 102 99  Temp: 37 C 36.5 C  Resp: 18 11    Complications: No apparent anesthesia complications

## 2015-09-04 NOTE — Progress Notes (Signed)
Per Dr. Mable FillB. Judd, no labs needed preop.

## 2015-09-04 NOTE — Progress Notes (Signed)
Orthopedic Tech Progress Note Patient Details:  Lenord FellersJaime M Popko 04/24/1976 [id number]  Ortho Devices Ortho Device/Splint Location: applied ohf to bed Ortho Device/Splint Interventions: Ordered, Application   Jennye MoccasinHughes, Deshawna Mcneece Craig 09/04/2015, 10:02 PM

## 2015-09-04 NOTE — Discharge Instructions (Addendum)
Orthopaedic Trauma Service Discharge Instructions   General Discharge Instructions  WEIGHT BEARING STATUS: Weightbearing as tolerated Right Leg for transfers, Nonweightbearing Left leg   RANGE OF MOTION/ACTIVITY: as tolerated  Wound Care: daily dressing changes starting 09/07/2015. See detailed instructions below    PAIN MEDICATION USE AND EXPECTATIONS  You have likely been given narcotic medications to help control your pain.  After a traumatic event that results in an fracture (broken bone) with or without surgery, it is ok to use narcotic pain medications to help control one's pain.  We understand that everyone responds to pain differently and each individual patient will be evaluated on a regular basis for the continued need for narcotic medications. Ideally, narcotic medication use should last no more than 6-8 weeks (coinciding with fracture healing).   As a patient it is your responsibility as well to monitor narcotic medication use and report the amount and frequency you use these medications when you come to your office visit.   We would also advise that if you are using narcotic medications, you should take a dose prior to therapy to maximize you participation.  IF YOU ARE ON NARCOTIC MEDICATIONS IT IS NOT PERMISSIBLE TO OPERATE A MOTOR VEHICLE (MOTORCYCLE/CAR/TRUCK/MOPED) OR HEAVY MACHINERY DO NOT MIX NARCOTICS WITH OTHER CNS (CENTRAL NERVOUS SYSTEM) DEPRESSANTS SUCH AS ALCOHOL  Diet: as you were eating previously.  Can use over the counter stool softeners and bowel preparations, such as Miralax, to help with bowel movements.  Narcotics can be constipating.  Be sure to drink plenty of fluids    STOP SMOKING OR USING NICOTINE PRODUCTS!!!!  As discussed nicotine severely impairs your body's ability to heal surgical and traumatic wounds but also impairs bone healing.  Wounds and bone heal by forming microscopic blood vessels (angiogenesis) and nicotine is a vasoconstrictor  (essentially, shrinks blood vessels).  Therefore, if vasoconstriction occurs to these microscopic blood vessels they essentially disappear and are unable to deliver necessary nutrients to the healing tissue.  This is one modifiable factor that you can do to dramatically increase your chances of healing your injury.    (This means no smoking, no nicotine gum, patches, etc)  DO NOT USE NONSTEROIDAL ANTI-INFLAMMATORY DRUGS (NSAID'S)  Using products such as Advil (ibuprofen), Aleve (naproxen), Motrin (ibuprofen) for additional pain control during fracture healing can delay and/or prevent the healing response.  If you would like to take over the counter (OTC) medication, Tylenol (acetaminophen) is ok.  However, some narcotic medications that are given for pain control contain acetaminophen as well. Therefore, you should not exceed more than 4000 mg of tylenol in a day if you do not have liver disease.  Also note that there are may OTC medicines, such as cold medicines and allergy medicines that my contain tylenol as well.  If you have any questions about medications and/or interactions please ask your doctor/PA or your pharmacist.      ICE AND ELEVATE INJURED/OPERATIVE EXTREMITY  Using ice and elevating the injured extremity above your heart can help with swelling and pain control.  Icing in a pulsatile fashion, such as 20 minutes on and 20 minutes off, can be followed.    Do not place ice directly on skin. Make sure there is a barrier between to skin and the ice pack.    Using frozen items such as frozen peas works well as the conform nicely to the are that needs to be iced.  USE AN ACE WRAP OR TED HOSE FOR SWELLING CONTROL  In addition to icing and elevation, Ace wraps or TED hose are used to help limit and resolve swelling.  It is recommended to use Ace wraps or TED hose until you are informed to stop.    When using Ace Wraps start the wrapping distally (farthest away from the body) and wrap proximally  (closer to the body)   Example: If you had surgery on your leg or thing and you do not have a splint on, start the ace wrap at the toes and work your way up to the thigh        If you had surgery on your upper extremity and do not have a splint on, start the ace wrap at your fingers and work your way up to the upper arm  IF YOU ARE IN A SPLINT OR CAST DO NOT REMOVE IT FOR ANY REASON   If your splint gets wet for any reason please contact the office immediately. You may shower in your splint or cast as long as you keep it dry.  This can be done by wrapping in a cast cover or garbage back (or similar)  Do Not stick any thing down your splint or cast such as pencils, money, or hangers to try and scratch yourself with.  If you feel itchy take benadryl as prescribed on the bottle for itching  IF YOU ARE IN A CAM BOOT (BLACK BOOT)  You may remove boot periodically. Perform daily dressing changes as noted below.  Wash the liner of the boot regularly and wear a sock when wearing the boot. It is recommended that you sleep in the boot until told otherwise  CALL THE OFFICE WITH ANY QUESTIONS OR CONCERTS: [phone]      Discharge Wound Care Instructions  Do NOT apply any ointments, solutions or lotions to pin sites or surgical wounds.  These prevent needed drainage and even though solutions like hydrogen peroxide kill bacteria, they also damage cells lining the pin sites that help fight infection.  Applying lotions or ointments can keep the wounds moist and can cause them to breakdown and open up as well. This can increase the risk for infection. When in doubt call the office.  Surgical incisions should be dressed daily.  If any drainage is noted, use one layer of adaptic, then gauze, Kerlix, and an ace wrap.  Once the incision is completely dry and without drainage, it may be left open to air out.  Showering may begin 36-48 hours later.  Cleaning gently with soap and water.  Traumatic wounds should  be dressed daily as well.    One layer of adaptic, gauze, Kerlix, then ace wrap.  The adaptic can be discontinued once the draining has ceased    If you have a wet to dry dressing: wet the gauze with saline the squeeze as much saline out so the gauze is moist (not soaking wet), place moistened gauze over wound, then place a dry gauze over the moist one, followed by Kerlix wrap, then ace wrap.

## 2015-09-04 NOTE — Anesthesia Postprocedure Evaluation (Signed)
Anesthesia Post Note  Patient: Lenord FellersJaime M Hoffert  Procedure(s) Performed: Procedure(s) (LRB): LEFT TO RIGHT TRANS SACRO-ILIAC SCREW (Left)  Patient location during evaluation: PACU Anesthesia Type: General Level of consciousness: sedated Pain management: pain level controlled Vital Signs Assessment: post-procedure vital signs reviewed and stable Respiratory status: spontaneous breathing and respiratory function stable Cardiovascular status: stable Anesthetic complications: no    Last Vitals:  Filed Vitals:   09/04/15 2000 09/04/15 2004  BP:    Pulse: 87 86  Temp:    Resp: 12 15    Last Pain:  Filed Vitals:   09/04/15 2005  PainSc: 9                  Chynah Orihuela DANIEL

## 2015-09-04 NOTE — Anesthesia Procedure Notes (Signed)
Procedure Name: Intubation Date/Time: 09/04/2015 5:49 PM Performed by: Izora Gala Pre-anesthesia Checklist: Patient identified, Emergency Drugs available, Suction available and Patient being monitored Patient Re-evaluated:Patient Re-evaluated prior to inductionOxygen Delivery Method: Circle system utilized Preoxygenation: Pre-oxygenation with 100% oxygen Intubation Type: IV induction Laryngoscope Size: Miller and 3 Grade View: Grade I Tube size: 7.0 mm Number of attempts: 1 Airway Equipment and Method: Stylet and LTA kit utilized Placement Confirmation: ETT inserted through vocal cords under direct vision,  positive ETCO2 and breath sounds checked- equal and bilateral Secured at: 22 cm Tube secured with: Tape Dental Injury: Teeth and Oropharynx as per pre-operative assessment

## 2015-09-04 NOTE — Brief Op Note (Signed)
[id number] 

## 2015-09-05 ENCOUNTER — Ambulatory Visit (HOSPITAL_COMMUNITY): Payer: 59

## 2015-09-05 DIAGNOSIS — M532X8 Spinal instabilities, sacral and sacrococcygeal region: Secondary | ICD-10-CM | POA: Diagnosis not present

## 2015-09-05 MED ORDER — DIPHENHYDRAMINE HCL 12.5 MG/5ML PO ELIX
25.0000 mg | ORAL_SOLUTION | Freq: Four times a day (QID) | ORAL | Status: DC | PRN
  Administered 2015-09-05 – 2015-09-06 (×2): 25 mg via ORAL
  Filled 2015-09-05 (×2): qty 10

## 2015-09-05 NOTE — Progress Notes (Signed)
Orthopaedic Trauma Service (OTS)   Subjective: Patient reports pain as moderately severe.  C/o right sided pain in addition to the left.  Denies motor or sensory deficit but occasional parethesia right leg bottom of the foot.  Objective: Temp:  [97.6 F (36.4 C)-98.6 F (37 C)] 97.6 F (36.4 C) (11/23 0410) Pulse Rate:  [76-121] 98 (11/23 0410) Resp:  [9-18] 16 (11/23 0410) BP: (113-148)/(58-104) 113/58 mmHg (11/23 0410) SpO2:  [99 %-100 %] 100 % (11/23 0410) Weight:  [170 lb (77.111 kg)-171 lb 15.3 oz (78 kg)] 171 lb 15.3 oz (78 kg) (11/22 2119) Physical Exam R&LLE Dressing intact, clean, dry  Edema/ swelling controlled  Sens: DPN, SPN, TN intact without discrete paresthesia, asymmetry, deficit  Motor EHL, ext, flex, evers 5/5 Tender right and left hips   Assessment/Plan: 1 Day Post-Op Procedure(s) (LRB): LEFT TO RIGHT TRANS SACRO-ILIAC SCREW Pain persists and is bilateral  1. CT scan to confirm screws are not particularly prominent on right side; flouro shows 1-2 threads beyond cortex, have explained that screws come in 5 mm increments 2. Will require another day inpatient stay for pain control 3. WBAT bilaterally but RLE> LLe for transfers to chair; will progress to WBAT on RLE in two weeks   Myrene GalasMichael Trae Bovenzi, MD [facility] [phone] (p)

## 2015-09-05 NOTE — Evaluation (Signed)
Physical Therapy Evaluation Patient Details Name: Jodi [name] MRN: [id number] DOB: 09/09/1976 Today's Date: 09/05/2015   History of Present Illness  Admitted with pelvic pain and SI joint instability; now s/p SI pinning  Past Medical History  Diagnosis Date  . Asthma     Cough variant  . DVT (deep venous thrombosis) ([place]) 10 yrs ago  . Asthma   . Allergic rhinitis   . Hyperlipidemia   . Shortness of breath     SOB even without exertion at times  . GERD (gastroesophageal reflux disease)   . IBS (irritable bowel syndrome)   . 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 Anemia   . Eczema   . Abdominal pain, chronic, right upper quadrant   . Chronic pain syndrome 06/08/2013  . Dyslipidemia 08/11/2012  . Acute recurrent maxillary sinusitis 11/04/2014  . Asthma   . Back pain   . Sickle-cell trait ([facility]    Past Surgical History  Procedure Laterality Date  . Blood clot removed from lower abdomen  2000  . Cyst removed from ovary and fluid removed from tubes  2000  . Partial hysterectomy  2009  . Esophagogastroduodenoscopy  10/22/2012    Procedure: ESOPHAGOGASTRODUODENOSCOPY (EGD);  Surgeon: NRogene Houston MD;  Location: [facility];  Service: Endoscopy;  Laterality: N/A;  100  . Colonoscopy  11/04/2012    Procedure: COLONOSCOPY;  Surgeon: NRogene Houston MD;  Location: [facility];  Service: Endoscopy;  Laterality: N/A;  915  . Abdominal hysterectomy  4-5 yrs ago  . Tubal ligation  1999  . Cholecystectomy  11/12/2012    Procedure: LAPAROSCOPIC CHOLECYSTECTOMY;  Surgeon: MJamesetta So MD;  Location: [facility] ORS;  Service: General;  Laterality: N/A;  . Cholecystectomy    . Abdominal hysterectomy    . Sacro-iliac pinning Right 07/30/2015    Procedure: SDub Mikes  Surgeon: MAltamese  Hills MD;  [facility]  Service: Orthopedics;  Laterality: Right;  . Orif pelvic fracture N/A 07/30/2015    Procedure: OPEN REDUCTION INTERNAL FIXATION (ORIF) PELVIC  FRACTURE;  Surgeon: MAltamese Moores Hill MD;  Location: [facility]  Service: Orthopedics;  Laterality: N/A;     Clinical Impression  Patient evaluated by Physical Therapy with no further acute PT needs identified. All education has been completed and the patient has no further questions.  See below for any follow-up Physical Therapy or equipment needs. PT is signing off. Thank you for this referral.     Follow Up Recommendations Outpatient PT (The potential need for Outpatient PT can be addressed at Ortho follow-up appointments.)    Equipment Recommendations  Wheelchair (measurements PT);Wheelchair cushion (measurements PT)    Recommendations for Other Services       Precautions / Restrictions Precautions Precautions: Other (comment) (Weightbearing prec) Restrictions RLE Weight Bearing: Weight bearing as tolerated (for transfers only) LLE Weight Bearing: Non weight bearing      Mobility  Bed Mobility Overal bed mobility: Modified Independent             General bed mobility comments: used rails, slow moving, but not needing assist  Transfers Overall transfer level: Needs assistance Equipment used: None Transfers: Squat Pivot Transfers     Squat pivot transfers: Min guard     General transfer comment: described transfer technique, placement of recliner (simulating wheelchair) on right and at an angle to minimize use of RLE as well; close guard for monitoring LLE for NWB and minimizing Weight bearing on  RLE; performed transfer quite well; we also discussed using th ewheelchair and removing armrest for lateral scoot; discussed AP transfer as well  Ambulation/Gait                Stairs            Wheelchair Mobility    Modified Rankin (Stroke Patients Only)       Balance                                             Pertinent Vitals/Pain Pain Assessment: Faces Faces Pain Scale: Hurts little more Pain Location: L and R sacrum with moving;  no pain repaorted at rest Pain Descriptors / Indicators: Aching;Grimacing Pain Intervention(s): Monitored during session;Premedicated before session    [place] expects to be discharged to:: Private residence Living Arrangements: Spouse/significant other Available Help at Discharge: Family;Friend(s);Available PRN/intermittently Type of Home: House Home Access: Stairs to enter Entrance Stairs-Rails: Right;Left;Can reach both Entrance Stairs-Number of Steps: 5 Home Layout: One level Home Equipment: Walker - 2 wheels;Bedside commode Additional Comments: Hd been managing PWB RLE for 5 weeks since initial injury    Prior Function Level of Independence: Independent               Hand Dominance        Extremity/Trunk Assessment   Upper Extremity Assessment: Overall WFL for tasks assessed           Lower Extremity Assessment:  (Minimized Weight Bearing bilaterally)         Communication   Communication: No difficulties  Cognition Arousal/Alertness: Awake/alert Behavior During Therapy: WFL for tasks assessed/performed Overall Cognitive Status: Within Functional Limits for tasks assessed                      General Comments General comments (skin integrity, edema, etc.): Discussed needing two person assist to get up steps to her home in a wheelchair; she reports her husband and a neighbor can assist her; provided pt with handout for technique    Exercises        Assessment/Plan    PT Assessment All further PT needs can be met in the next venue of care  PT Diagnosis Difficulty walking;Acute pain   PT Problem List Decreased strength;Decreased range of motion;Decreased activity tolerance;Decreased balance;Decreased mobility;Pain  PT Treatment Interventions     PT Goals (Current goals can be found in the Care Plan section) Acute Rehab PT Goals Patient Stated Goal: hopes to be home tomorrow PT Goal Formulation: All assessment and  education complete, DC therapy    Frequency     Barriers to discharge        Co-evaluation               End of Session   Activity Tolerance: Patient tolerated treatment well Patient left: in chair;with call bell/phone within reach Nurse Communication: Mobility status         Time: 8299-3716 PT Time Calculation (min) (ACUTE ONLY): 21 min   Charges:   PT Evaluation $Initial PT Evaluation Tier I: 1 Procedure     PT G CodesQuin Hoop 09/05/2015, 4:12 PM  Roney Marion, [place] Pager [phone] Office [phone]

## 2015-09-05 NOTE — Progress Notes (Signed)
  CT scan follow up demonstrates ideal placement of S1 and S2 transsacral screws without excessive prominence of the tips or heads.  Patient reassured re findings. Continue plans as previously noted.  Myrene GalasMichael Saliou Barnier, MD [facility] [phone] (p)

## 2015-09-06 DIAGNOSIS — M532X8 Spinal instabilities, sacral and sacrococcygeal region: Secondary | ICD-10-CM | POA: Diagnosis not present

## 2015-09-06 NOTE — Progress Notes (Signed)
Subjective: 2 Days Post-Op Procedure(s) (LRB): LEFT TO RIGHT TRANS SACRO-ILIAC SCREW (Left) Patient reports pain as mild and moderate.    Objective: Vital signs in last 24 hours: Temp:  [98.1 F (36.7 C)-98.3 F (36.8 C)] 98.2 F (36.8 C) (11/24 0609) Pulse Rate:  [102-119] 119 (11/24 0609) Resp:  [16-17] 16 (11/24 0609) BP: (123-150)/(58-87) 150/87 mmHg (11/24 0609) SpO2:  [100 %] 100 % (11/24 0609)  Intake/Output from previous day:   Intake/Output this shift:    No results for input(s): HGB in the last 72 hours. No results for input(s): WBC, RBC, HCT, PLT in the last 72 hours. No results for input(s): NA, K, CL, CO2, BUN, CREATININE, GLUCOSE, CALCIUM in the last 72 hours. No results for input(s): LABPT, INR in the last 72 hours.  sitting in chair comfortably  Assessment/Plan: 2 Days Post-Op Procedure(s) (LRB): LEFT TO RIGHT TRANS SACRO-ILIAC SCREW (Left) Up with PT Discharge home today Will get her a wheelchair prior to d/c  Margart SicklesChadwell, Dorita Rowlands 09/06/2015, 8:55 AM

## 2015-09-06 NOTE — Care Management Note (Signed)
Case Management Note  Patient Details  Name: Jodi [name] MRN: [id number] Date of Birth: 08/09/1976  Subjective/Objective:   39 yr old female s/p Left S1-S2 transiliac screw fixation                 Action/Plan: Case manager spoke with patient's husband concerning need for wheelchair for home. Patient has no other home health or DME needs.    Expected Discharge Date:  09/05/15               Expected Discharge Plan:     In-House Referral:  NA  Discharge planning Services  CM Consult  Post Acute Care Choice:  Durable Medical Equipment Choice offered to:     DME Arranged:  Wheelchair manual DME Agency:  [facility]  HH Arranged:  NA HH Agency:     Status of Service:  Completed, signed off  Medicare Important Message Given:    Date Medicare IM Given:    Medicare IM give by:    Date Additional Medicare IM Given:    Additional Medicare Important Message give by:     If discussed at Long Length of Stay Meetings, dates discussed:    Additional Comments:  Durenda GuthrieBrady, Lanai Conlee Naomi, RN 09/06/2015, 9:01 AM

## 2015-09-06 NOTE — Progress Notes (Signed)
Pt ready for d/c home per MD. Cleared by PT. Discharge instructions and prescriptions were reviewed with pt and her husband at bedside, all questions answered. Waiting for wheelchair to be delivered. Will continue to monitor.   Lowella DellHudson, Naveena Eyman G  09/06/2015 10:32 AM

## 2015-09-10 ENCOUNTER — Encounter (HOSPITAL_COMMUNITY): Payer: Self-pay | Admitting: Orthopedic Surgery

## 2015-09-21 NOTE — Discharge Summary (Signed)
[facility]  Patient ID: Lenord FellersJaime M Meenach MRN: [id number] DOB/AGE: 41/10/1975 41 y.o.  Admit date: 09/04/2015 Discharge date: 09/21/2015  Admission Diagnoses: Left SI joint instability History of horse riding accident Allergic rhinitis Dyslipidemia Asthma History of pelvic ring fracture  Discharge Diagnoses:  Principal Problem:   SI (sacroiliac) joint dysfunction Active Problems:   Allergic rhinitis   Dyslipidemia   Vitamin D deficiency   Asthma   Pelvic ring fracture (HCC)   Fall from horse   Procedures Performed: 09/04/2015- Dr. Carola FrostHandy 1. Transsacral screw fixation, left and right S1. 2. Transsacral screw fixation, left and right S2.   Discharged Condition: good  Hospital Course:   39 year old black female sustained injury about 5 weeks ago after she fell off her horse and intercourse less than on her. The patient had a pelvic ring injury at that time. She was taken to the OR for right SI screw and ORIF of her pubic symphysis. She has had persistent left-sided back pain over these last several weeks. And presented to the OR for left SI screws. Patient tolerated seizures well after surgery she was transferred to the PACU for recovery from anesthesia and transferred to orthopedic floor for observation, pain control and therapy. Patient's hospital stay was uncomplicated. It was 2 days only postoperatively she mobilized well with physical therapy. Pain was her primary issue which was managed with IV and oral pain medications. On postoperative day #2 patient was deemed to be stable for discharge. She is weightbearing as tolerated on her right for transfers and nonweightbearing on the left.  Patient did complain of some right-sided pain on postoperative day #1. A CT scan was obtained which confirm that the 2 trans-a transsacral screws had excellent position without excessive prominence of the heads or tips.  Consults: None  Significant Diagnostic Studies:  None  Treatments: IV hydration, antibiotics: Ancef, analgesia: Dilaudid, Percocet, OxyIR, Robaxin, anticoagulation: LMW heparin, therapies: PT, OT and RN and surgery: As above  Discharge Exam:  Subjective: 2 Days Post-Op Procedure(s) (LRB): LEFT TO RIGHT TRANS SACRO-ILIAC SCREW (Left) Patient reports pain as mild and moderate.    Objective: Vital signs in last 24 hours: Temp:  [98.1 F (36.7 C)-98.3 F (36.8 C)] 98.2 F (36.8 C) (11/24 0609) Pulse Rate:  [102-119] 119 (11/24 0609) Resp:  [16-17] 16 (11/24 0609) BP: (123-150)/(58-87) 150/87 mmHg (11/24 0609) SpO2:  [100 %] 100 % (11/24 0609)  Intake/Output from previous day:   Intake/Output this shift:     Recent Labs (last 2 labs)     No results for input(s): HGB in the last 72 hours.    Recent Labs (last 2 labs)     No results for input(s): WBC, RBC, HCT, PLT in the last 72 hours.    Recent Labs (last 2 labs)     No results for input(s): NA, K, CL, CO2, BUN, CREATININE, GLUCOSE, CALCIUM in the last 72 hours.    Recent Labs (last 2 labs)     No results for input(s): LABPT, INR in the last 72 hours.    sitting in chair comfortably  Assessment/Plan: 2 Days Post-Op Procedure(s) (LRB): LEFT TO RIGHT TRANS SACRO-ILIAC SCREW (Left) Up with PT Discharge home today Will get her a wheelchair prior to d/c  Margart SicklesChadwell, Joshua 09/06/2015, 8:55 AM    Disposition: 01-Home or Self Care      Discharge Instructions    Call MD / Call 911    Complete by:  As directed   If you  experience chest pain or shortness of breath, CALL 911 and be transported to the hospital emergency room.  If you develope a fever above 101 F, pus (white drainage) or increased drainage or redness at the wound, or calf pain, call your surgeon's office.     Constipation Prevention    Complete by:  As directed   Drink plenty of fluids.  Prune juice may be helpful.  You may use a stool softener, such as Colace (over the counter) 100 mg twice a day.  Use  MiraLax (over the counter) for constipation as needed.     Diet general    Complete by:  As directed      Discharge instructions    Complete by:  As directed   [facility] Discharge Instructions   General Discharge Instructions  WEIGHT BEARING STATUS: Weightbearing as tolerated Right Leg for transfers. Nonweightbearing Left leg    RANGE OF MOTION/ACTIVITY: as tolerated  Wound Care: daily dressing changes starting 09/07/2015. See detailed instructions below   PAIN MEDICATION USE AND EXPECTATIONS  You have likely been given narcotic medications to help control your pain.  After a traumatic event that results in an fracture (broken bone) with or without surgery, it is ok to use narcotic pain medications to help control one's pain.  We understand that everyone responds to pain differently and each individual patient will be evaluated on a regular basis for the continued need for narcotic medications. Ideally, narcotic medication use should last no more than 6-8 weeks (coinciding with fracture healing).   As a patient it is your responsibility as well to monitor narcotic medication use and report the amount and frequency you use these medications when you come to your office visit.   We would also advise that if you are using narcotic medications, you should take a dose prior to therapy to maximize you participation.  IF YOU ARE ON NARCOTIC MEDICATIONS IT IS NOT PERMISSIBLE TO OPERATE A MOTOR VEHICLE (MOTORCYCLE/CAR/TRUCK/MOPED) OR HEAVY MACHINERY DO NOT MIX NARCOTICS WITH OTHER CNS (CENTRAL NERVOUS SYSTEM) DEPRESSANTS SUCH AS ALCOHOL  Diet: as you were eating previously.  Can use over the counter stool softeners and bowel preparations, such as Miralax, to help with bowel movements.  Narcotics can be constipating.  Be sure to drink plenty of fluids    STOP SMOKING OR USING NICOTINE PRODUCTS!!!!  As discussed nicotine severely impairs your body's ability to heal surgical and  traumatic wounds but also impairs bone healing.  Wounds and bone heal by forming microscopic blood vessels (angiogenesis) and nicotine is a vasoconstrictor (essentially, shrinks blood vessels).  Therefore, if vasoconstriction occurs to these microscopic blood vessels they essentially disappear and are unable to deliver necessary nutrients to the healing tissue.  This is one modifiable factor that you can do to dramatically increase your chances of healing your injury.    (This means no smoking, no nicotine gum, patches, etc)  DO NOT USE NONSTEROIDAL ANTI-INFLAMMATORY DRUGS (NSAID'S)  Using products such as Advil (ibuprofen), Aleve (naproxen), Motrin (ibuprofen) for additional pain control during fracture healing can delay and/or prevent the healing response.  If you would like to take over the counter (OTC) medication, Tylenol (acetaminophen) is ok.  However, some narcotic medications that are given for pain control contain acetaminophen as well. Therefore, you should not exceed more than 4000 mg of tylenol in a day if you do not have liver disease.  Also note that there are may OTC medicines, such as cold medicines and  allergy medicines that my contain tylenol as well.  If you have any questions about medications and/or interactions please ask your doctor/PA or your pharmacist.      ICE AND ELEVATE INJURED/OPERATIVE EXTREMITY  Using ice and elevating the injured extremity above your heart can help with swelling and pain control.  Icing in a pulsatile fashion, such as 20 minutes on and 20 minutes off, can be followed.    Do not place ice directly on skin. Make sure there is a barrier between to skin and the ice pack.    Using frozen items such as frozen peas works well as the conform nicely to the are that needs to be iced.  USE AN ACE WRAP OR TED HOSE FOR SWELLING CONTROL  In addition to icing and elevation, Ace wraps or TED hose are used to help limit and resolve swelling.  It is recommended to use  Ace wraps or TED hose until you are informed to stop.    When using Ace Wraps start the wrapping distally (farthest away from the body) and wrap proximally (closer to the body)   Example: If you had surgery on your leg or thing and you do not have a splint on, start the ace wrap at the toes and work your way up to the thigh        If you had surgery on your upper extremity and do not have a splint on, start the ace wrap at your fingers and work your way up to the upper arm  IF YOU ARE IN A SPLINT OR CAST DO NOT REMOVE IT FOR ANY REASON   If your splint gets wet for any reason please contact the office immediately. You may shower in your splint or cast as long as you keep it dry.  This can be done by wrapping in a cast cover or garbage back (or similar)  Do Not stick any thing down your splint or cast such as pencils, money, or hangers to try and scratch yourself with.  If you feel itchy take benadryl as prescribed on the bottle for itching  IF YOU ARE IN A CAM BOOT (BLACK BOOT)  You may remove boot periodically. Perform daily dressing changes as noted below.  Wash the liner of the boot regularly and wear a sock when wearing the boot. It is recommended that you sleep in the boot until told otherwise  CALL THE OFFICE WITH ANY QUESTIONS OR CONCERTS: [phone]      Discharge Wound Care Instructions  Do NOT apply any ointments, solutions or lotions to pin sites or surgical wounds.  These prevent needed drainage and even though solutions like hydrogen peroxide kill bacteria, they also damage cells lining the pin sites that help fight infection.  Applying lotions or ointments can keep the wounds moist and can cause them to breakdown and open up as well. This can increase the risk for infection. When in doubt call the office.  Surgical incisions should be dressed daily.  If any drainage is noted, use one layer of adaptic, then gauze, Kerlix, and an ace wrap.  Once the incision is completely dry  and without drainage, it may be left open to air out.  Showering may begin 36-48 hours later.  Cleaning gently with soap and water.  Traumatic wounds should be dressed daily as well.    One layer of adaptic, gauze, Kerlix, then ace wrap.  The adaptic can be discontinued once the draining has ceased  If you have a wet to dry dressing: wet the gauze with saline the squeeze as much saline out so the gauze is moist (not soaking wet), place moistened gauze over wound, then place a dry gauze over the moist one, followed by Kerlix wrap, then ace wrap.     Increase activity slowly as tolerated    Complete by:  As directed             Medication List    STOP taking these medications        enoxaparin 40 MG/0.4ML injection  Commonly known as:  LOVENOX     ibuprofen 800 MG tablet  Commonly known as:  ADVIL,MOTRIN     predniSONE 5 MG (21) Tbpk tablet  Commonly known as:  STERAPRED UNI-PAK 21 TAB      TAKE these medications        acetaminophen 325 MG tablet  Commonly known as:  TYLENOL  Take 2 tablets (650 mg total) by mouth every 4 (four) hours as needed for mild pain.     albuterol 108 (90 BASE) MCG/ACT inhaler  Commonly known as:  PROVENTIL HFA;VENTOLIN HFA  Inhale 2 puffs into the lungs every 6 (six) hours as needed for wheezing or shortness of breath.     arformoterol 15 MCG/2ML Nebu  Commonly known as:  BROVANA  Take 15 mcg by nebulization 2 (two) times daily.     aspirin EC 325 MG tablet  Take 1 tablet (325 mg total) by mouth 2 (two) times daily.     desipramine 50 MG tablet  Commonly known as:  NORPRAMIN  Take 50 mg by mouth at bedtime.     docusate sodium 100 MG capsule  Commonly known as:  COLACE  Take 1 capsule (100 mg total) by mouth 2 (two) times daily.     fluticasone 50 MCG/ACT nasal spray  Commonly known as:  FLONASE  USE TWO SPRAY(S) IN EACH NOSTRIL ONCE DAILY     Fluticasone Furoate-Vilanterol 100-25 MCG/INH Aepb  Inhale 1 puff into the lungs daily.      HYDROmorphone 4 MG tablet  Commonly known as:  DILAUDID  Take 1-2 tablets (4-8 mg total) by mouth every 4 (four) hours as needed (4mg  for mild pain, 6mg  for moderate pain, 8mg  for severe pain).     ipratropium-albuterol 0.5-2.5 (3) MG/3ML Soln  Commonly known as:  DUONEB  Take 3 mLs by nebulization every 6 (six) hours as needed (shortness of breath).     levocetirizine 5 MG tablet  Commonly known as:  XYZAL  Take 5 mg by mouth every evening.     methocarbamol 750 MG tablet  Commonly known as:  ROBAXIN  Take 2 tablets (1,500 mg total) by mouth every 6 (six) hours as needed for muscle spasms.     montelukast 10 MG tablet  Commonly known as:  SINGULAIR  TAKE ONE TABLET BY MOUTH ONCE DAILY IN THE MORNING     omeprazole 20 MG capsule  Commonly known as:  PRILOSEC  Take 1 capsule (20 mg total) by mouth daily.     oxycodone 5 MG capsule  Commonly known as:  OXY-IR  Take 1-3 capsules (5-15 mg total) by mouth every 4 (four) hours as needed for pain (severe pain). Patient states it is the IR     polyethylene glycol packet  Commonly known as:  MIRALAX / GLYCOLAX  Take 17 g by mouth daily.     ranitidine 150 MG tablet  Commonly known  as:  ZANTAC  TAKE ONE TABLET BY MOUTH TWICE DAILY     traMADol 50 MG tablet  Commonly known as:  ULTRAM  Take 1-2 tablets (50-100 mg total) by mouth every 8 (eight) hours as needed for moderate pain or severe pain.     Vitamin D (Ergocalciferol) 50000 UNITS Caps capsule  Commonly known as:  DRISDOL  Take 1 capsule (50,000 Units total) by mouth every 7 (seven) days.       Follow-up Information    Schedule an appointment as soon as possible for a visit with Budd Palmer, MD.   Specialty:  Orthopedic Surgery   Why:  For suture removal, For wound re-check   Contact information:   [address] [place] [address] [phone]       Discharge Instructions and Plan:  Weight-bear as tolerated on right leg for  transfers Weightbearing on left leg Dressing changes as needed Okay to shower and clean wounds with soap and water Follow-up in the office in 10-14 days  Signed:  Mearl Latin, PA-C [facility] [phone] (P) 09/21/2015, 10:46 AM

## 2015-12-11 ENCOUNTER — Other Ambulatory Visit: Payer: Self-pay | Admitting: Family Medicine

## 2015-12-12 ENCOUNTER — Other Ambulatory Visit: Payer: Self-pay | Admitting: Orthopedic Surgery

## 2015-12-12 DIAGNOSIS — G8929 Other chronic pain: Secondary | ICD-10-CM

## 2015-12-12 DIAGNOSIS — M533 Sacrococcygeal disorders, not elsewhere classified: Principal | ICD-10-CM

## 2015-12-19 ENCOUNTER — Ambulatory Visit
Admission: RE | Admit: 2015-12-19 | Discharge: 2015-12-19 | Disposition: A | Discharge status: Home / Self Care | Payer: 59 | Source: Ambulatory Visit | Attending: Orthopedic Surgery | Admitting: Orthopedic Surgery

## 2015-12-19 DIAGNOSIS — M533 Sacrococcygeal disorders, not elsewhere classified: Principal | ICD-10-CM

## 2015-12-19 DIAGNOSIS — G8929 Other chronic pain: Secondary | ICD-10-CM

## 2015-12-19 MED ORDER — METHYLPREDNISOLONE ACETATE 40 MG/ML INJ SUSP (RADIOLOG
120.0000 mg | Freq: Once | INTRAMUSCULAR | Status: AC
  Administered 2015-12-19: 120 mg via INTRA_ARTICULAR

## 2015-12-19 MED ORDER — IOHEXOL 180 MG/ML  SOLN
1.0000 mL | Freq: Once | INTRAMUSCULAR | Status: AC | PRN
  Administered 2015-12-19: 1 mL via INTRA_ARTICULAR

## 2015-12-28 ENCOUNTER — Other Ambulatory Visit: Payer: Self-pay | Admitting: Orthopedic Surgery

## 2015-12-28 DIAGNOSIS — S332XXA Dislocation of sacroiliac and sacrococcygeal joint, initial encounter: Secondary | ICD-10-CM

## 2016-01-03 ENCOUNTER — Ambulatory Visit
Admission: RE | Admit: 2016-01-03 | Discharge: 2016-01-03 | Disposition: A | Discharge status: Home / Self Care | Payer: 59 | Source: Ambulatory Visit | Attending: Orthopedic Surgery | Admitting: Orthopedic Surgery

## 2016-01-03 DIAGNOSIS — S332XXA Dislocation of sacroiliac and sacrococcygeal joint, initial encounter: Secondary | ICD-10-CM

## 2016-02-20 ENCOUNTER — Encounter (HOSPITAL_COMMUNITY): Payer: Self-pay | Admitting: *Deleted

## 2016-02-20 NOTE — Progress Notes (Signed)
Pt denies cardiac history, chest pain or sob.   Echo (done due to asthma hx) - 12/14/14 - in EPIC.

## 2016-02-20 NOTE — H&P (Signed)
Orthopaedic Trauma Service H&P   Chief Complaint:  Left SI joint Arthritis  HPI:   Patient is a 40 year old black female well-known to the orthopedic trauma service for a pelvic ring injury following a horse riding accident. Patient has undergone numerous surgeries but has had persistent low back pain related to her pelvic ring fracture. Patient presents today for fusion of her left sacroiliac joint.  Past Medical History  Diagnosis Date  . Asthma     Cough variant  . DVT (deep venous thrombosis) (HCC) 10 yrs ago  . Asthma   . Allergic rhinitis   . Hyperlipidemia   . Shortness of breath     SOB even without exertion at times  . GERD (gastroesophageal reflux disease)   . IBS (irritable bowel syndrome)   . 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 Anemia   . Eczema   . Abdominal pain, chronic, right upper quadrant   . Chronic pain syndrome 06/08/2013  . Dyslipidemia 08/11/2012  . Acute recurrent maxillary sinusitis 11/04/2014  . Asthma   . Back pain   . Sickle-cell trait (HCC)   . Heart murmur     as a child  . Pneumonia   . Arthritis     knee    Past Surgical History  Procedure Laterality Date  . Blood clot removed from lower abdomen  2000  . Cyst removed from ovary and fluid removed from tubes  2000  . Partial hysterectomy  2009  . Esophagogastroduodenoscopy  10/22/2012    Procedure: ESOPHAGOGASTRODUODENOSCOPY (EGD);  Surgeon: [name], MD;  Location: [facility];  Service: Endoscopy;  Laterality: N/A;  100  . Colonoscopy  11/04/2012    Procedure: COLONOSCOPY;  Surgeon: [name], MD;  Location: [facility];  Service: Endoscopy;  Laterality: N/A;  915  . Abdominal hysterectomy  4-5 yrs ago  . Tubal ligation  1999  . Cholecystectomy  11/12/2012    Procedure: LAPAROSCOPIC CHOLECYSTECTOMY;  Surgeon: Dalia HeadingMark A Jenkins, MD;  Location: [facility];  Service: General;  Laterality: N/A;  . Cholecystectomy    . Abdominal hysterectomy    . Sacro-iliac  pinning Right 07/30/2015    Procedure: Loyal GamblerSACRO-ILIAC PINNING;  Surgeon: Myrene GalasMichael Handy, MD;  [facility];  Service: Orthopedics;  Laterality: Right;  . Orif pelvic fracture N/A 07/30/2015    Procedure: OPEN REDUCTION INTERNAL FIXATION (ORIF) PELVIC FRACTURE;  Surgeon: Myrene GalasMichael Handy, MD;  [facility];  Service: Orthopedics;  Laterality: N/A;  . Sacro-iliac pinning Left 09/04/2015    Procedure: LEFT TO RIGHT TRANS SACRO-ILIAC SCREW;  Surgeon: Myrene GalasMichael Handy, MD;  [facility];  Service: Orthopedics;  Laterality: Left;    Family History  Problem Relation Age of Onset  . Asthma Mother   . Hypertension Mother   . Bronchiolitis Mother   . Hypertension Father   . Hyperlipidemia Father     recurrent rectum polyps   . Diabetes Sister   . Lupus Sister   . Depression Sister    Social History:  reports that she quit smoking about 7 years ago. Her smoking use included Cigarettes. She has a 2.5 pack-year smoking history. She has never used smokeless tobacco. She reports that she does not drink alcohol or use illicit drugs.  Allergies:  Allergies  Allergen Reactions  . Compazine Other (See Comments)    Face swells, tongue sticks out  . Compazine [Prochlorperazine]     Can not control facial muscles  No current facility-administered medications on file prior to encounter.   Current Outpatient Prescriptions on File Prior to Encounter  Medication Sig Dispense Refill  . acetaminophen (TYLENOL) 325 MG tablet Take 2 tablets (650 mg total) by mouth every 4 (four) hours as needed for mild pain. (Patient taking differently: Take 650 mg by mouth 2 (two) times daily as needed for mild pain. )    . albuterol (PROVENTIL HFA;VENTOLIN HFA) 108 (90 BASE) MCG/ACT inhaler Inhale 2 puffs into the lungs every 6 (six) hours as needed for wheezing or shortness of breath. 18 g 0  . arformoterol (BROVANA) 15 MCG/2ML NEBU Take 15 mcg by nebulization 2 (two) times daily as needed (for wheezing or shortness of  breath).     Marland Kitchen aspirin EC 325 MG tablet Take 1 tablet (325 mg total) by mouth 2 (two) times daily. 60 tablet 0  . desipramine (NORPRAMIN) 50 MG tablet Take 50 mg by mouth at bedtime.    . docusate sodium (COLACE) 100 MG capsule Take 1 capsule (100 mg total) by mouth 2 (two) times daily. (Patient taking differently: Take 100 mg by mouth daily as needed for mild constipation. ) 60 capsule 0  . fluticasone (FLONASE) 50 MCG/ACT nasal spray USE TWO SPRAY(S) IN EACH NOSTRIL ONCE DAILY 16 g 3  . Fluticasone Furoate-Vilanterol 100-25 MCG/INH AEPB Inhale 1 puff into the lungs daily.    Marland Kitchen ipratropium-albuterol (DUONEB) 0.5-2.5 (3) MG/3ML SOLN Take 3 mLs by nebulization every 6 (six) hours as needed (shortness of breath).     Marland Kitchen levocetirizine (XYZAL) 5 MG tablet TAKE ONE TABLET BY MOUTH ONCE DAILY IN THE EVENING 30 tablet 2  . montelukast (SINGULAIR) 10 MG tablet TAKE ONE TABLET BY MOUTH ONCE DAILY IN THE MORNING 30 tablet 2  . omeprazole (PRILOSEC) 20 MG capsule Take 1 capsule (20 mg total) by mouth daily. 30 capsule 5  . polyethylene glycol (MIRALAX / GLYCOLAX) packet Take 17 g by mouth daily. (Patient taking differently: Take 17 g by mouth daily as needed for mild constipation. ) 30 each 0  . ranitidine (ZANTAC) 150 MG tablet TAKE ONE TABLET BY MOUTH TWICE DAILY 60 tablet 3    No results found for this or any previous visit (from the past 48 hour(s)). No results found.  Review of Systems  Constitutional: Negative for fever and chills.  Respiratory: Negative for shortness of breath and wheezing.   Cardiovascular: Negative for chest pain.  Gastrointestinal: Negative for nausea and vomiting.  Genitourinary: Negative for dysuria and urgency.  Musculoskeletal:       + Posterior pelvic pain   Neurological: Negative for tingling, sensory change and headaches.    Last menstrual period 05/17/2012. Physical Exam  Constitutional: She is oriented to person, place, and time. She appears well-developed and  well-nourished.  HENT:  Head: Normocephalic and atraumatic.  Eyes: Conjunctivae are normal. Pupils are equal, round, and reactive to light.  Cardiovascular: Normal rate and regular rhythm.   Respiratory: Breath sounds normal. No respiratory distress.  GI: Soft. Bowel sounds are normal. She exhibits no distension.  Musculoskeletal:  Pelvis   + Left sided SI pain with FABER and Lateral compression     Motor and sensory functions intact    Ext warm    + DP pulse     All wounds stable and well healed   Neurological: She is alert and oriented to person, place, and time.  Skin: Skin is warm and dry.  Psychiatric: She has a normal mood  and affect.     Assessment/Plan  40 y/o female with L SI arthritis   OR for [facility] and L SI fusion  Admit post op for pain control and therapy  TDWB post op  Pt understands risks and benefits of surgery, wishes to proceed   Mearl Latin, PA-C 02/20/2016, 10:59 AM

## 2016-02-21 ENCOUNTER — Encounter (HOSPITAL_COMMUNITY)
Admission: RE | Disposition: A | Discharge status: Home / Self Care | Payer: Self-pay | Source: Ambulatory Visit | Attending: Orthopedic Surgery

## 2016-02-21 ENCOUNTER — Inpatient Hospital Stay (HOSPITAL_COMMUNITY)
Admission: RE | Admit: 2016-02-21 | Discharge: 2016-02-23 | DRG: 460 | Disposition: A | Discharge status: Home / Self Care | Payer: 59 | Source: Ambulatory Visit | Attending: Orthopedic Surgery | Admitting: Orthopedic Surgery

## 2016-02-21 ENCOUNTER — Inpatient Hospital Stay (HOSPITAL_COMMUNITY): Payer: 59 | Admitting: Anesthesiology

## 2016-02-21 ENCOUNTER — Inpatient Hospital Stay (HOSPITAL_COMMUNITY): Payer: 59

## 2016-02-21 ENCOUNTER — Encounter (HOSPITAL_COMMUNITY): Payer: Self-pay | Admitting: *Deleted

## 2016-02-21 DIAGNOSIS — Z86718 Personal history of other venous thrombosis and embolism: Secondary | ICD-10-CM | POA: Diagnosis not present

## 2016-02-21 DIAGNOSIS — K219 Gastro-esophageal reflux disease without esophagitis: Secondary | ICD-10-CM | POA: Diagnosis present

## 2016-02-21 DIAGNOSIS — Z419 Encounter for procedure for purposes other than remedying health state, unspecified: Secondary | ICD-10-CM

## 2016-02-21 DIAGNOSIS — E785 Hyperlipidemia, unspecified: Secondary | ICD-10-CM | POA: Diagnosis present

## 2016-02-21 DIAGNOSIS — J8410 Pulmonary fibrosis, unspecified: Secondary | ICD-10-CM | POA: Diagnosis present

## 2016-02-21 DIAGNOSIS — M461 Sacroiliitis, not elsewhere classified: Secondary | ICD-10-CM | POA: Diagnosis present

## 2016-02-21 DIAGNOSIS — Z87891 Personal history of nicotine dependence: Secondary | ICD-10-CM | POA: Diagnosis not present

## 2016-02-21 DIAGNOSIS — Z888 Allergy status to other drugs, medicaments and biological substances status: Secondary | ICD-10-CM | POA: Diagnosis not present

## 2016-02-21 DIAGNOSIS — E559 Vitamin D deficiency, unspecified: Secondary | ICD-10-CM | POA: Diagnosis present

## 2016-02-21 DIAGNOSIS — M47818 Spondylosis without myelopathy or radiculopathy, sacral and sacrococcygeal region: Secondary | ICD-10-CM | POA: Diagnosis present

## 2016-02-21 DIAGNOSIS — J45909 Unspecified asthma, uncomplicated: Secondary | ICD-10-CM | POA: Diagnosis present

## 2016-02-21 DIAGNOSIS — D573 Sickle-cell trait: Secondary | ICD-10-CM | POA: Diagnosis present

## 2016-02-21 DIAGNOSIS — K589 Irritable bowel syndrome without diarrhea: Secondary | ICD-10-CM | POA: Diagnosis present

## 2016-02-21 DIAGNOSIS — Z79899 Other long term (current) drug therapy: Secondary | ICD-10-CM | POA: Diagnosis not present

## 2016-02-21 DIAGNOSIS — G894 Chronic pain syndrome: Secondary | ICD-10-CM | POA: Diagnosis present

## 2016-02-21 DIAGNOSIS — E8889 Other specified metabolic disorders: Secondary | ICD-10-CM | POA: Diagnosis present

## 2016-02-21 DIAGNOSIS — Z7982 Long term (current) use of aspirin: Secondary | ICD-10-CM

## 2016-02-21 DIAGNOSIS — M47898 Other spondylosis, sacral and sacrococcygeal region: Secondary | ICD-10-CM | POA: Diagnosis present

## 2016-02-21 DIAGNOSIS — D62 Acute posthemorrhagic anemia: Secondary | ICD-10-CM | POA: Diagnosis not present

## 2016-02-21 DIAGNOSIS — J309 Allergic rhinitis, unspecified: Secondary | ICD-10-CM | POA: Diagnosis present

## 2016-02-21 DIAGNOSIS — Z8249 Family history of ischemic heart disease and other diseases of the circulatory system: Secondary | ICD-10-CM | POA: Diagnosis not present

## 2016-02-21 DIAGNOSIS — M533 Sacrococcygeal disorders, not elsewhere classified: Secondary | ICD-10-CM | POA: Diagnosis present

## 2016-02-21 HISTORY — PX: HARDWARE REMOVAL: SHX979

## 2016-02-21 HISTORY — DX: Unspecified osteoarthritis, unspecified site: M19.90

## 2016-02-21 HISTORY — DX: Pneumonia, unspecified organism: J18.9

## 2016-02-21 HISTORY — PX: SACROILIAC JOINT FUSION: SHX6088

## 2016-02-21 HISTORY — DX: Cardiac murmur, unspecified: R01.1

## 2016-02-21 LAB — COMPREHENSIVE METABOLIC PANEL
ALBUMIN: 3.7 g/dL (ref 3.5–5.0)
ALK PHOS: 90 U/L (ref 38–126)
ALT: 21 U/L (ref 14–54)
AST: 28 U/L (ref 15–41)
Anion gap: 11 (ref 5–15)
BILIRUBIN TOTAL: 0.5 mg/dL (ref 0.3–1.2)
BUN: 7 mg/dL (ref 6–20)
CO2: 24 mmol/L (ref 22–32)
CREATININE: 0.92 mg/dL (ref 0.44–1.00)
Calcium: 9 mg/dL (ref 8.9–10.3)
Chloride: 106 mmol/L (ref 101–111)
GFR calc Af Amer: >60 mL/min (ref >60)
GFR calc non Af Amer: >60 mL/min (ref >60)
GLUCOSE: 105 mg/dL — AB (ref 65–99)
Potassium: 3.8 mmol/L (ref 3.5–5.1)
SODIUM: 141 mmol/L (ref 135–145)
Total Protein: 6.5 g/dL (ref 6.5–8.1)

## 2016-02-21 LAB — CBC
HEMATOCRIT: 31.7 % — AB (ref 36.0–46.0)
Hemoglobin: 10.5 g/dL — ABNORMAL LOW (ref 12.0–15.0)
MCH: 28.4 pg (ref 26.0–34.0)
MCHC: 33.1 g/dL (ref 30.0–36.0)
MCV: 85.7 fL (ref 78.0–100.0)
Platelets: 340 10*3/uL (ref 150–400)
RBC: 3.7 MIL/uL — ABNORMAL LOW (ref 3.87–5.11)
RDW: 13.7 % (ref 11.5–15.5)
WBC: 11.5 10*3/uL — AB (ref 4.0–10.5)

## 2016-02-21 LAB — CBC WITH DIFFERENTIAL/PLATELET
BASOS PCT: 1 %
Basophils Absolute: 0 10*3/uL (ref 0.0–0.1)
EOS ABS: 0.1 10*3/uL (ref 0.0–0.7)
Eosinophils Relative: 1 %
HCT: 34.3 % — ABNORMAL LOW (ref 36.0–46.0)
HEMOGLOBIN: 11.5 g/dL — AB (ref 12.0–15.0)
Lymphocytes Relative: 32 %
Lymphs Abs: 1.7 10*3/uL (ref 0.7–4.0)
MCH: 28.5 pg (ref 26.0–34.0)
MCHC: 33.5 g/dL (ref 30.0–36.0)
MCV: 84.9 fL (ref 78.0–100.0)
Monocytes Absolute: 0.6 10*3/uL (ref 0.1–1.0)
Monocytes Relative: 11 %
NEUTROS ABS: 3.1 10*3/uL (ref 1.7–7.7)
NEUTROS PCT: 55 %
Platelets: 375 10*3/uL (ref 150–400)
RBC: 4.04 MIL/uL (ref 3.87–5.11)
RDW: 13.6 % (ref 11.5–15.5)
WBC: 5.5 10*3/uL (ref 4.0–10.5)

## 2016-02-21 LAB — PROTIME-INR
INR: 1.02 (ref 0.00–1.49)
Prothrombin Time: 13.6 seconds (ref 11.6–15.2)

## 2016-02-21 LAB — APTT: APTT: 33 s (ref 24–37)

## 2016-02-21 LAB — CREATININE, SERUM
Creatinine, Ser: 0.84 mg/dL (ref 0.44–1.00)
GFR calc non Af Amer: >60 mL/min (ref >60)

## 2016-02-21 SURGERY — SACROILIAC JOINT FUSION
Anesthesia: General | Laterality: Left

## 2016-02-21 MED ORDER — CHLORHEXIDINE GLUCONATE 4 % EX LIQD
60.0000 mL | Freq: Once | CUTANEOUS | Status: DC
Start: 2016-02-21 — End: 2016-02-21

## 2016-02-21 MED ORDER — ROCURONIUM BROMIDE 100 MG/10ML IV SOLN
INTRAVENOUS | Status: DC | PRN
Start: 2016-02-21 — End: 2016-02-21
  Administered 2016-02-21: 40 mg via INTRAVENOUS

## 2016-02-21 MED ORDER — LORATADINE 10 MG PO TABS
10.0000 mg | ORAL_TABLET | Freq: Every evening | ORAL | Status: DC
  Administered 2016-02-21 – 2016-02-22 (×2): 10 mg via ORAL
  Filled 2016-02-21 (×2): qty 1

## 2016-02-21 MED ORDER — IPRATROPIUM-ALBUTEROL 0.5-2.5 (3) MG/3ML IN SOLN: 3.0000 mL | Freq: Four times a day (QID) | RESPIRATORY_TRACT | Status: DC | PRN

## 2016-02-21 MED ORDER — MIDAZOLAM HCL 5 MG/5ML IJ SOLN
INTRAMUSCULAR | Status: DC | PRN
  Administered 2016-02-21: 2 mg via INTRAVENOUS

## 2016-02-21 MED ORDER — ENOXAPARIN SODIUM 40 MG/0.4ML ~~LOC~~ SOLN
40.0000 mg | SUBCUTANEOUS | Status: DC
  Administered 2016-02-22 – 2016-02-23 (×2): 40 mg via SUBCUTANEOUS
  Filled 2016-02-21 (×2): qty 0.4

## 2016-02-21 MED ORDER — FENTANYL CITRATE (PF) 100 MCG/2ML IJ SOLN
INTRAMUSCULAR | Status: AC
  Administered 2016-02-21: 50 ug via INTRAVENOUS
  Filled 2016-02-21: qty 2

## 2016-02-21 MED ORDER — METOCLOPRAMIDE HCL 5 MG/ML IJ SOLN: 5.0000 mg | Freq: Three times a day (TID) | INTRAMUSCULAR | Status: DC | PRN

## 2016-02-21 MED ORDER — FENTANYL CITRATE (PF) 250 MCG/5ML IJ SOLN
INTRAMUSCULAR | Status: AC
  Filled 2016-02-21: qty 5

## 2016-02-21 MED ORDER — FENTANYL CITRATE (PF) 100 MCG/2ML IJ SOLN
25.0000 ug | INTRAMUSCULAR | Status: DC | PRN
  Administered 2016-02-21 (×3): 50 ug via INTRAVENOUS

## 2016-02-21 MED ORDER — ACETAMINOPHEN 650 MG RE SUPP: 650.0000 mg | Freq: Four times a day (QID) | RECTAL | Status: DC | PRN

## 2016-02-21 MED ORDER — MIDAZOLAM HCL 2 MG/2ML IJ SOLN
INTRAMUSCULAR | Status: AC
  Filled 2016-02-21: qty 2

## 2016-02-21 MED ORDER — METHOCARBAMOL 1000 MG/10ML IJ SOLN
500.0000 mg | Freq: Four times a day (QID) | INTRAVENOUS | Status: DC | PRN
  Filled 2016-02-21: qty 5

## 2016-02-21 MED ORDER — MAGNESIUM HYDROXIDE 400 MG/5ML PO SUSP: 30.0000 mL | Freq: Every day | ORAL | Status: DC | PRN

## 2016-02-21 MED ORDER — LACTATED RINGERS IV SOLN
INTRAVENOUS | Status: DC
  Administered 2016-02-21 (×2): via INTRAVENOUS

## 2016-02-21 MED ORDER — FAMOTIDINE 20 MG PO TABS
20.0000 mg | ORAL_TABLET | Freq: Two times a day (BID) | ORAL | Status: DC
  Administered 2016-02-21 – 2016-02-23 (×4): 20 mg via ORAL
  Filled 2016-02-21 (×4): qty 1

## 2016-02-21 MED ORDER — ONDANSETRON HCL 4 MG PO TABS: 4.0000 mg | ORAL_TABLET | Freq: Four times a day (QID) | ORAL | Status: DC | PRN

## 2016-02-21 MED ORDER — CYCLOBENZAPRINE HCL 10 MG PO TABS
10.0000 mg | ORAL_TABLET | Freq: Three times a day (TID) | ORAL | Status: DC | PRN
  Administered 2016-02-22 – 2016-02-23 (×4): 10 mg via ORAL
  Filled 2016-02-21 (×4): qty 1

## 2016-02-21 MED ORDER — ARFORMOTEROL TARTRATE 15 MCG/2ML IN NEBU
15.0000 ug | INHALATION_SOLUTION | Freq: Two times a day (BID) | RESPIRATORY_TRACT | Status: DC | PRN
Start: 2016-02-21 — End: 2016-02-21

## 2016-02-21 MED ORDER — CEFAZOLIN SODIUM 1-5 GM-% IV SOLN
1.0000 g | Freq: Four times a day (QID) | INTRAVENOUS | Status: AC
  Administered 2016-02-21 – 2016-02-22 (×3): 1 g via INTRAVENOUS
  Filled 2016-02-21 (×3): qty 50

## 2016-02-21 MED ORDER — HYDROMORPHONE HCL 1 MG/ML IJ SOLN
1.0000 mg | INTRAMUSCULAR | Status: DC | PRN
  Administered 2016-02-21 – 2016-02-22 (×2): 2 mg via INTRAVENOUS
  Administered 2016-02-22: 1 mg via INTRAVENOUS
  Administered 2016-02-22: 2 mg via INTRAVENOUS
  Filled 2016-02-21 (×4): qty 2
  Filled 2016-02-21: qty 1

## 2016-02-21 MED ORDER — ONDANSETRON HCL 4 MG/2ML IJ SOLN: 4.0000 mg | Freq: Once | INTRAMUSCULAR | Status: DC | PRN

## 2016-02-21 MED ORDER — FENTANYL CITRATE (PF) 100 MCG/2ML IJ SOLN
50.0000 ug | Freq: Once | INTRAMUSCULAR | Status: AC
  Administered 2016-02-21: 50 ug via INTRAVENOUS

## 2016-02-21 MED ORDER — POTASSIUM CHLORIDE 2 MEQ/ML IV SOLN: INTRAVENOUS | Status: DC

## 2016-02-21 MED ORDER — KETOROLAC TROMETHAMINE 30 MG/ML IJ SOLN
30.0000 mg | Freq: Once | INTRAMUSCULAR | Status: AC
  Administered 2016-02-21: 30 mg via INTRAVENOUS

## 2016-02-21 MED ORDER — FLUTICASONE FUROATE-VILANTEROL 100-25 MCG/INH IN AEPB
1.0000 | INHALATION_SPRAY | Freq: Every day | RESPIRATORY_TRACT | Status: DC
  Filled 2016-02-21: qty 28

## 2016-02-21 MED ORDER — FENTANYL CITRATE (PF) 100 MCG/2ML IJ SOLN
INTRAMUSCULAR | Status: DC | PRN
  Administered 2016-02-21 (×2): 50 ug via INTRAVENOUS
  Administered 2016-02-21: 200 ug via INTRAVENOUS
  Administered 2016-02-21: 100 ug via INTRAVENOUS

## 2016-02-21 MED ORDER — DESIPRAMINE HCL 50 MG PO TABS
50.0000 mg | ORAL_TABLET | Freq: Every day | ORAL | Status: DC
  Administered 2016-02-21 – 2016-02-22 (×2): 50 mg via ORAL
  Filled 2016-02-21 (×3): qty 1

## 2016-02-21 MED ORDER — POTASSIUM CHLORIDE IN NACL 20-0.9 MEQ/L-% IV SOLN
INTRAVENOUS | Status: DC
  Administered 2016-02-21: 22:00:00 via INTRAVENOUS
  Filled 2016-02-21: qty 1000

## 2016-02-21 MED ORDER — METHOCARBAMOL 500 MG PO TABS
500.0000 mg | ORAL_TABLET | Freq: Four times a day (QID) | ORAL | Status: DC | PRN
  Administered 2016-02-21: 500 mg via ORAL
  Administered 2016-02-22: 1000 mg via ORAL
  Filled 2016-02-21: qty 2
  Filled 2016-02-21: qty 1

## 2016-02-21 MED ORDER — PROPOFOL 10 MG/ML IV BOLUS
INTRAVENOUS | Status: DC | PRN
Start: 2016-02-21 — End: 2016-02-21
  Administered 2016-02-21: 180 mg via INTRAVENOUS

## 2016-02-21 MED ORDER — ALBUTEROL SULFATE (2.5 MG/3ML) 0.083% IN NEBU: 3.5000 mg | INHALATION_SOLUTION | Freq: Four times a day (QID) | RESPIRATORY_TRACT | Status: DC | PRN

## 2016-02-21 MED ORDER — ONDANSETRON HCL 4 MG/2ML IJ SOLN
INTRAMUSCULAR | Status: DC | PRN
  Administered 2016-02-21: 4 mg via INTRAVENOUS

## 2016-02-21 MED ORDER — METHYLENE BLUE 0.5 % INJ SOLN
INTRAVENOUS | Status: AC
  Filled 2016-02-21: qty 10

## 2016-02-21 MED ORDER — LIDOCAINE 2% (20 MG/ML) 5 ML SYRINGE
INTRAMUSCULAR | Status: AC
  Filled 2016-02-21: qty 15

## 2016-02-21 MED ORDER — OXYCODONE HCL 5 MG PO TABS: 5.0000 mg | ORAL_TABLET | Freq: Once | ORAL | Status: DC | PRN

## 2016-02-21 MED ORDER — OXYCODONE HCL 5 MG/5ML PO SOLN: 5.0000 mg | Freq: Once | ORAL | Status: DC | PRN

## 2016-02-21 MED ORDER — 0.9 % SODIUM CHLORIDE (POUR BTL) OPTIME
TOPICAL | Status: DC | PRN
  Administered 2016-02-21: 1000 mL

## 2016-02-21 MED ORDER — CEFAZOLIN SODIUM-DEXTROSE 2-4 GM/100ML-% IV SOLN
2.0000 g | INTRAVENOUS | Status: AC
  Administered 2016-02-21: 2 g via INTRAVENOUS
  Filled 2016-02-21: qty 100

## 2016-02-21 MED ORDER — BISACODYL 5 MG PO TBEC: 5.0000 mg | DELAYED_RELEASE_TABLET | Freq: Every day | ORAL | Status: DC | PRN

## 2016-02-21 MED ORDER — ACETAMINOPHEN 325 MG PO TABS: 650.0000 mg | ORAL_TABLET | Freq: Four times a day (QID) | ORAL | Status: DC | PRN

## 2016-02-21 MED ORDER — PROPOFOL 10 MG/ML IV BOLUS
INTRAVENOUS | Status: AC
  Filled 2016-02-21: qty 20

## 2016-02-21 MED ORDER — LIDOCAINE HCL (CARDIAC) 20 MG/ML IV SOLN
INTRAVENOUS | Status: DC | PRN
  Administered 2016-02-21: 50 mg via INTRAVENOUS

## 2016-02-21 MED ORDER — NEOSTIGMINE METHYLSULFATE 5 MG/5ML IV SOSY
PREFILLED_SYRINGE | INTRAVENOUS | Status: AC
  Filled 2016-02-21: qty 5

## 2016-02-21 MED ORDER — ONDANSETRON HCL 4 MG/2ML IJ SOLN: 4.0000 mg | Freq: Four times a day (QID) | INTRAMUSCULAR | Status: DC | PRN

## 2016-02-21 MED ORDER — MONTELUKAST SODIUM 10 MG PO TABS
10.0000 mg | ORAL_TABLET | Freq: Every morning | ORAL | Status: DC
  Administered 2016-02-22 – 2016-02-23 (×2): 10 mg via ORAL
  Filled 2016-02-21 (×2): qty 1

## 2016-02-21 MED ORDER — DOCUSATE SODIUM 100 MG PO CAPS
100.0000 mg | ORAL_CAPSULE | Freq: Two times a day (BID) | ORAL | Status: DC
  Administered 2016-02-21 – 2016-02-23 (×4): 100 mg via ORAL
  Filled 2016-02-21 (×5): qty 1

## 2016-02-21 MED ORDER — KETOROLAC TROMETHAMINE 30 MG/ML IJ SOLN
INTRAMUSCULAR | Status: AC
  Filled 2016-02-21: qty 1

## 2016-02-21 MED ORDER — HYDROCODONE-ACETAMINOPHEN 10-325 MG PO TABS
1.0000 | ORAL_TABLET | Freq: Four times a day (QID) | ORAL | Status: DC | PRN
  Administered 2016-02-21 – 2016-02-23 (×7): 2 via ORAL
  Filled 2016-02-21 (×7): qty 2

## 2016-02-21 MED ORDER — METOCLOPRAMIDE HCL 5 MG PO TABS: 5.0000 mg | ORAL_TABLET | Freq: Three times a day (TID) | ORAL | Status: DC | PRN

## 2016-02-21 MED ORDER — IOPAMIDOL (ISOVUE-300) INJECTION 61%
INTRAVENOUS | Status: DC | PRN
  Administered 2016-02-21: 4 mL

## 2016-02-21 SURGICAL SUPPLY — 67 items
BANDAGE ELASTIC 4 VELCRO ST LF (GAUZE/BANDAGES/DRESSINGS) ×2 IMPLANT
BANDAGE ELASTIC 6 VELCRO ST LF (GAUZE/BANDAGES/DRESSINGS) ×2 IMPLANT
BANDAGE ESMARK 6X9 LF (GAUZE/BANDAGES/DRESSINGS) ×1 IMPLANT
BNDG CMPR 9X6 STRL LF SNTH (GAUZE/BANDAGES/DRESSINGS) ×1
BNDG COHESIVE 6X5 TAN STRL LF (GAUZE/BANDAGES/DRESSINGS) ×2 IMPLANT
BNDG ESMARK 6X9 LF (GAUZE/BANDAGES/DRESSINGS) ×2
BNDG GAUZE ELAST 4 BULKY (GAUZE/BANDAGES/DRESSINGS) ×4 IMPLANT
BRUSH SCRUB DISP (MISCELLANEOUS) ×4 IMPLANT
CLEANER TIP ELECTROSURG 2X2 (MISCELLANEOUS) ×2 IMPLANT
COVER SURGICAL LIGHT HANDLE (MISCELLANEOUS) ×4 IMPLANT
CUFF TOURNIQUET SINGLE 18IN (TOURNIQUET CUFF) IMPLANT
CUFF TOURNIQUET SINGLE 24IN (TOURNIQUET CUFF) IMPLANT
CUFF TOURNIQUET SINGLE 34IN LL (TOURNIQUET CUFF) IMPLANT
DECORTICATORS SIJ SIMMETRY 21 (Neuro Prosthesis/Implant) ×2 IMPLANT
DECORTICATORS SIJ SIMMETRY 23 (Neuro Prosthesis/Implant) ×1 IMPLANT
DRAPE C-ARM 42X72 X-RAY (DRAPES) IMPLANT
DRAPE C-ARMOR (DRAPES) ×2 IMPLANT
DRAPE OEC MINIVIEW 54X84 (DRAPES) ×2 IMPLANT
DRAPE U-SHAPE 47X51 STRL (DRAPES) ×2 IMPLANT
DRSG ADAPTIC 3X8 NADH LF (GAUZE/BANDAGES/DRESSINGS) ×2 IMPLANT
DRSG MEPILEX BORDER 4X4 (GAUZE/BANDAGES/DRESSINGS) ×1 IMPLANT
ELECT REM PT RETURN 9FT ADLT (ELECTROSURGICAL) ×2
ELECTRODE REM PT RTRN 9FT ADLT (ELECTROSURGICAL) ×1 IMPLANT
EVACUATOR 1/8 PVC DRAIN (DRAIN) IMPLANT
GAUZE SPONGE 4X4 12PLY STRL (GAUZE/BANDAGES/DRESSINGS) ×2 IMPLANT
GLOVE BIO SURGEON STRL SZ7.5 (GLOVE) ×2 IMPLANT
GLOVE BIO SURGEON STRL SZ8 (GLOVE) ×2 IMPLANT
GLOVE BIOGEL PI IND STRL 7.5 (GLOVE) ×1 IMPLANT
GLOVE BIOGEL PI IND STRL 8 (GLOVE) ×1 IMPLANT
GLOVE BIOGEL PI INDICATOR 7.5 (GLOVE) ×1
GLOVE BIOGEL PI INDICATOR 8 (GLOVE) ×1
GOWN STRL REUS W/ TWL LRG LVL3 (GOWN DISPOSABLE) ×2 IMPLANT
GOWN STRL REUS W/ TWL XL LVL3 (GOWN DISPOSABLE) ×1 IMPLANT
GOWN STRL REUS W/TWL LRG LVL3 (GOWN DISPOSABLE) ×4
GOWN STRL REUS W/TWL XL LVL3 (GOWN DISPOSABLE) ×2
KIT BASIN OR (CUSTOM PROCEDURE TRAY) ×2 IMPLANT
KIT INFUSE LRG II (Orthopedic Implant) ×1 IMPLANT
KIT ROOM TURNOVER OR (KITS) ×2 IMPLANT
MANIFOLD NEPTUNE II (INSTRUMENTS) ×2 IMPLANT
NEEDLE 22X1 1/2 (OR ONLY) (NEEDLE) IMPLANT
NS IRRIG 1000ML POUR BTL (IV SOLUTION) ×2 IMPLANT
PACK ORTHO EXTREMITY (CUSTOM PROCEDURE TRAY) ×2 IMPLANT
PAD ARMBOARD 7.5X6 YLW CONV (MISCELLANEOUS) ×4 IMPLANT
PADDING CAST COTTON 6X4 STRL (CAST SUPPLIES) ×6 IMPLANT
SCREW CANN 7.3X130 32MM THRD (Screw) ×1 IMPLANT
SCREW CANN 7.3X145 32MM THRD (Screw) ×1 IMPLANT
SCREW SIMMETRY SIJ 12.5MMX60MM (Screw) ×1 IMPLANT
SPONGE LAP 18X18 X RAY DECT (DISPOSABLE) ×2 IMPLANT
SPONGE SCRUB IODOPHOR (GAUZE/BANDAGES/DRESSINGS) ×2 IMPLANT
STAPLER VISISTAT 35W (STAPLE) IMPLANT
STOCKINETTE IMPERVIOUS LG (DRAPES) ×2 IMPLANT
STRIP CLOSURE SKIN 1/2X4 (GAUZE/BANDAGES/DRESSINGS) IMPLANT
SUCTION FRAZIER HANDLE 10FR (MISCELLANEOUS)
SUCTION TUBE FRAZIER 10FR DISP (MISCELLANEOUS) IMPLANT
SUT ETHILON 3 0 PS 1 (SUTURE) ×1 IMPLANT
SUT PDS AB 2-0 CT1 27 (SUTURE) IMPLANT
SUT VIC AB 0 CT1 27 (SUTURE) ×2
SUT VIC AB 0 CT1 27XBRD ANBCTR (SUTURE) IMPLANT
SUT VIC AB 2-0 CT1 27 (SUTURE) ×2
SUT VIC AB 2-0 CT1 TAPERPNT 27 (SUTURE) IMPLANT
SYR CONTROL 10ML LL (SYRINGE) IMPLANT
TOWEL OR 17X24 6PK STRL BLUE (TOWEL DISPOSABLE) ×4 IMPLANT
TOWEL OR 17X26 10 PK STRL BLUE (TOWEL DISPOSABLE) ×4 IMPLANT
TUBE CONNECTING 12X1/4 (SUCTIONS) ×2 IMPLANT
UNDERPAD 30X30 INCONTINENT (UNDERPADS AND DIAPERS) ×2 IMPLANT
WATER STERILE IRR 1000ML POUR (IV SOLUTION) ×4 IMPLANT
YANKAUER SUCT BULB TIP NO VENT (SUCTIONS) ×2 IMPLANT

## 2016-02-21 NOTE — Anesthesia Postprocedure Evaluation (Signed)
Anesthesia Post Note  Patient: Jodi [name]  Procedure(s) Performed: Procedure(s) (LRB): LEFT SACROILIAC JOINT FUSION (Left) HARDWARE REMOVAL LEFT SACROILIAC JOINT (Left)  Patient location during evaluation: PACU Anesthesia Type: General Level of consciousness: awake, awake and alert and oriented Pain management: pain level controlled Vital Signs Assessment: post-procedure vital signs reviewed and stable Respiratory status: spontaneous breathing, nonlabored ventilation and respiratory function stable Cardiovascular status: blood pressure returned to baseline Anesthetic complications: no    Last Vitals:  Filed Vitals:   02/21/16 1635 02/21/16 1650  BP: 160/114 153/103  Pulse: 100 107  Temp:    Resp: 10 15    Last Pain:  Filed Vitals:   02/21/16 1703  PainSc: 10-Worst pain ever                 Alawna Graybeal COKER

## 2016-02-21 NOTE — Transfer of Care (Signed)
Immediate Anesthesia Transfer of Care Note  Patient: Lenord FellersJaime M Ziebarth  Procedure(s) Performed: Procedure(s): LEFT SACROILIAC JOINT FUSION (Left) HARDWARE REMOVAL LEFT SACROILIAC JOINT (Left)  Patient Location: PACU  Anesthesia Type:General  Level of Consciousness: awake, oriented and patient cooperative  Airway & Oxygen Therapy: Patient Spontanous Breathing and Patient connected to nasal cannula oxygen  Post-op Assessment: Report given to RN, Post -op Vital signs reviewed and stable and Patient moving all extremities  Post vital signs: Reviewed and stable  Last Vitals:  Filed Vitals:   02/21/16 1042  BP: 138/81  Pulse: 103  Temp: 36.8 C  Resp: 18    Last Pain: There were no vitals filed for this visit.    Patients Stated Pain Goal: 3 (02/21/16 1101)  Complications: No apparent anesthesia complications

## 2016-02-21 NOTE — Anesthesia Preprocedure Evaluation (Signed)
Anesthesia Evaluation  Patient identified by MRN, date of birth, ID band Patient awake    Reviewed: Allergy & Precautions, NPO status , Patient's Chart, lab work & pertinent test results  Airway Mallampati: II  TM Distance: >3 FB Neck ROM: Full    Dental  (+) Teeth Intact, Dental Advisory Given   Pulmonary former smoker,    breath sounds clear to auscultation       Cardiovascular  Rhythm:Regular Rate:Normal     Neuro/Psych    GI/Hepatic   Endo/Other    Renal/GU      Musculoskeletal   Abdominal   Peds  Hematology   Anesthesia Other Findings   Reproductive/Obstetrics                             Anesthesia Physical Anesthesia Plan  ASA: II  Anesthesia Plan: General   Post-op Pain Management:    Induction: Intravenous  Airway Management Planned: Oral ETT  Additional Equipment:   Intra-op Plan:   Post-operative Plan: Extubation in OR  Informed Consent: I have reviewed the patients History and Physical, chart, labs and discussed the procedure including the risks, benefits and alternatives for the proposed anesthesia with the patient or authorized representative who has indicated his/her understanding and acceptance.   Dental advisory given  Plan Discussed with: CRNA and Anesthesiologist  Anesthesia Plan Comments:         Anesthesia Quick Evaluation  

## 2016-02-21 NOTE — Anesthesia Procedure Notes (Signed)
Procedure Name: Intubation Date/Time: 02/21/2016 1:07 PM Performed by: Marena ChancyBECKNER, Kayti Poss S Pre-anesthesia Checklist: Patient identified, Emergency Drugs available, Suction available, Patient being monitored and Timeout performed Patient Re-evaluated:Patient Re-evaluated prior to inductionOxygen Delivery Method: Circle system utilized Preoxygenation: Pre-oxygenation with 100% oxygen Intubation Type: IV induction Ventilation: Mask ventilation without difficulty and Oral airway inserted - appropriate to patient size Laryngoscope Size: Hyacinth MeekerMiller and 2 Grade View: Grade II Tube type: Oral Tube size: 7.5 mm Number of attempts: 1 Placement Confirmation: ETT inserted through vocal cords under direct vision,  positive ETCO2 and breath sounds checked- equal and bilateral Tube secured with: Tape Dental Injury: Teeth and Oropharynx as per pre-operative assessment

## 2016-02-22 ENCOUNTER — Encounter (HOSPITAL_COMMUNITY): Payer: Self-pay | Admitting: Orthopedic Surgery

## 2016-02-22 LAB — COMPREHENSIVE METABOLIC PANEL
ALT: 19 U/L (ref 14–54)
AST: 30 U/L (ref 15–41)
Albumin: 2.9 g/dL — ABNORMAL LOW (ref 3.5–5.0)
Alkaline Phosphatase: 78 U/L (ref 38–126)
Anion gap: 10 (ref 5–15)
BUN: 6 mg/dL (ref 6–20)
CHLORIDE: 103 mmol/L (ref 101–111)
CO2: 27 mmol/L (ref 22–32)
CREATININE: 0.76 mg/dL (ref 0.44–1.00)
Calcium: 8.4 mg/dL — ABNORMAL LOW (ref 8.9–10.3)
Glucose, Bld: 108 mg/dL — ABNORMAL HIGH (ref 65–99)
POTASSIUM: 3.6 mmol/L (ref 3.5–5.1)
Sodium: 140 mmol/L (ref 135–145)
Total Bilirubin: 0.6 mg/dL (ref 0.3–1.2)
Total Protein: 5.5 g/dL — ABNORMAL LOW (ref 6.5–8.1)

## 2016-02-22 LAB — CBC WITH DIFFERENTIAL/PLATELET
Basophils Absolute: 0 10*3/uL (ref 0.0–0.1)
Basophils Relative: 0 %
EOS PCT: 2 %
Eosinophils Absolute: 0.1 10*3/uL (ref 0.0–0.7)
HCT: 28.3 % — ABNORMAL LOW (ref 36.0–46.0)
Hemoglobin: 9.6 g/dL — ABNORMAL LOW (ref 12.0–15.0)
LYMPHS ABS: 1.2 10*3/uL (ref 0.7–4.0)
LYMPHS PCT: 17 %
MCH: 29.8 pg (ref 26.0–34.0)
MCHC: 33.9 g/dL (ref 30.0–36.0)
MCV: 87.9 fL (ref 78.0–100.0)
MONO ABS: 0.8 10*3/uL (ref 0.1–1.0)
MONOS PCT: 11 %
Neutro Abs: 5.1 10*3/uL (ref 1.7–7.7)
Neutrophils Relative %: 70 %
PLATELETS: 335 10*3/uL (ref 150–400)
RBC: 3.22 MIL/uL — ABNORMAL LOW (ref 3.87–5.11)
RDW: 13.8 % (ref 11.5–15.5)
WBC: 7.2 10*3/uL (ref 4.0–10.5)

## 2016-02-22 LAB — TSH: TSH: 2.449 u[IU]/mL (ref 0.350–4.500)

## 2016-02-22 LAB — PREALBUMIN: PREALBUMIN: 22.5 mg/dL (ref 18–38)

## 2016-02-22 LAB — MAGNESIUM: Magnesium: 1.6 mg/dL — ABNORMAL LOW (ref 1.7–2.4)

## 2016-02-22 LAB — PHOSPHORUS: Phosphorus: 3.2 mg/dL (ref 2.5–4.6)

## 2016-02-22 MED ORDER — OXYCODONE HCL 5 MG PO TABS: 5.0000 mg | ORAL_TABLET | Freq: Four times a day (QID) | ORAL | Status: DC | PRN

## 2016-02-22 MED ORDER — CYCLOBENZAPRINE HCL 10 MG PO TABS: 10.0000 mg | ORAL_TABLET | Freq: Three times a day (TID) | ORAL | Status: DC | PRN

## 2016-02-22 MED ORDER — ASPIRIN EC 325 MG PO TBEC: 325.0000 mg | DELAYED_RELEASE_TABLET | Freq: Every day | ORAL | Status: DC

## 2016-02-22 MED ORDER — HYDROCODONE-ACETAMINOPHEN 10-325 MG PO TABS
1.0000 | ORAL_TABLET | Freq: Four times a day (QID) | ORAL | Status: DC | PRN
Start: 2016-02-22 — End: 2017-01-05

## 2016-02-22 NOTE — Discharge Instructions (Signed)
Orthopaedic Trauma Service Discharge Instructions   General Discharge Instructions  WEIGHT BEARING STATUS: Touchdown weightbearing Left leg   RANGE OF MOTION/ACTIVITY: range of motion as tolerated, activity as tolerated while maintaining weightbearing restrictions   Wound Care:see below  Discharge Wound Care Instructions  Do NOT apply any ointments, solutions or lotions to pin sites or surgical wounds.  These prevent needed drainage and even though solutions like hydrogen peroxide kill bacteria, they also damage cells lining the pin sites that help fight infection.  Applying lotions or ointments can keep the wounds moist and can cause them to breakdown and open up as well. This can increase the risk for infection. When in doubt call the office.  Surgical incisions should be dressed daily.  If any drainage is noted, use one layer of adaptic, then gauze, Kerlix, and an ace wrap.  Once the incision is completely dry and without drainage, it may be left open to air out.  Showering may begin 36-48 hours later.  Cleaning gently with soap and water.  Traumatic wounds should be dressed daily as well.    One layer of adaptic, gauze, Kerlix, then ace wrap.  The adaptic can be discontinued once the draining has ceased    If you have a wet to dry dressing: wet the gauze with saline the squeeze as much saline out so the gauze is moist (not soaking wet), place moistened gauze over wound, then place a dry gauze over the moist one, followed by Kerlix wrap, then ace wrap.   PAIN MEDICATION USE AND EXPECTATIONS  You have likely been given narcotic medications to help control your pain.  After a traumatic event that results in an fracture (broken bone) with or without surgery, it is ok to use narcotic pain medications to help control one's pain.  We understand that everyone responds to pain differently and each individual patient will be evaluated on a regular basis for the continued need for narcotic  medications. Ideally, narcotic medication use should last no more than 6-8 weeks (coinciding with fracture healing).   As a patient it is your responsibility as well to monitor narcotic medication use and report the amount and frequency you use these medications when you come to your office visit.   We would also advise that if you are using narcotic medications, you should take a dose prior to therapy to maximize you participation.  IF YOU ARE ON NARCOTIC MEDICATIONS IT IS NOT PERMISSIBLE TO OPERATE A MOTOR VEHICLE (MOTORCYCLE/CAR/TRUCK/MOPED) OR HEAVY MACHINERY DO NOT MIX NARCOTICS WITH OTHER CNS (CENTRAL NERVOUS SYSTEM) DEPRESSANTS SUCH AS ALCOHOL  Diet: as you were eating previously.  Can use over the counter stool softeners and bowel preparations, such as Miralax, to help with bowel movements.  Narcotics can be constipating.  Be sure to drink plenty of fluids    STOP SMOKING OR USING NICOTINE PRODUCTS!!!!  As discussed nicotine severely impairs your body's ability to heal surgical and traumatic wounds but also impairs bone healing.  Wounds and bone heal by forming microscopic blood vessels (angiogenesis) and nicotine is a vasoconstrictor (essentially, shrinks blood vessels).  Therefore, if vasoconstriction occurs to these microscopic blood vessels they essentially disappear and are unable to deliver necessary nutrients to the healing tissue.  This is one modifiable factor that you can do to dramatically increase your chances of healing your injury.    (This means no smoking, no nicotine gum, patches, etc)  DO NOT USE NONSTEROIDAL ANTI-INFLAMMATORY DRUGS (NSAID'S)  Using products such as  Advil (ibuprofen), Aleve (naproxen), Motrin (ibuprofen) for additional pain control during fracture healing can delay and/or prevent the healing response.  If you would like to take over the counter (OTC) medication, Tylenol (acetaminophen) is ok.  However, some narcotic medications that are given for pain  control contain acetaminophen as well. Therefore, you should not exceed more than 4000 mg of tylenol in a day if you do not have liver disease.  Also note that there are may OTC medicines, such as cold medicines and allergy medicines that my contain tylenol as well.  If you have any questions about medications and/or interactions please ask your doctor/PA or your pharmacist.      ICE AND ELEVATE INJURED/OPERATIVE EXTREMITY  Using ice and elevating the injured extremity above your heart can help with swelling and pain control.  Icing in a pulsatile fashion, such as 20 minutes on and 20 minutes off, can be followed.    Do not place ice directly on skin. Make sure there is a barrier between to skin and the ice pack.    Using frozen items such as frozen peas works well as the conform nicely to the are that needs to be iced.  USE AN ACE WRAP OR TED HOSE FOR SWELLING CONTROL  In addition to icing and elevation, Ace wraps or TED hose are used to help limit and resolve swelling.  It is recommended to use Ace wraps or TED hose until you are informed to stop.    When using Ace Wraps start the wrapping distally (farthest away from the body) and wrap proximally (closer to the body)   Example: If you had surgery on your leg or thing and you do not have a splint on, start the ace wrap at the toes and work your way up to the thigh        If you had surgery on your upper extremity and do not have a splint on, start the ace wrap at your fingers and work your way up to the upper arm  IF YOU ARE IN A SPLINT OR CAST DO NOT REMOVE IT FOR ANY REASON   If your splint gets wet for any reason please contact the office immediately. You may shower in your splint or cast as long as you keep it dry.  This can be done by wrapping in a cast cover or garbage back (or similar)  Do Not stick any thing down your splint or cast such as pencils, money, or hangers to try and scratch yourself with.  If you feel itchy take benadryl as  prescribed on the bottle for itching  IF YOU ARE IN A CAM BOOT (BLACK BOOT)  You may remove boot periodically. Perform daily dressing changes as noted below.  Wash the liner of the boot regularly and wear a sock when wearing the boot. It is recommended that you sleep in the boot until told otherwise  CALL THE OFFICE WITH ANY QUESTIONS OR CONCERTS: [phone]

## 2016-02-22 NOTE — Discharge Summary (Signed)
Orthopaedic Trauma Service (OTS)  Patient ID: KIRSTAN FENTRESS MRN: [id number] DOB/AGE: 42-Dec-1977 42 y.o.  Admit date: 02/21/2016 Discharge date: 02/22/2016  Admission Diagnoses: L SI joint arthritis   Allergic rhinitis   Vitamin D deficiency   Asthma   GERD (gastroesophageal reflux disease)   SI (sacroiliac) joint dysfunction  Discharge Diagnoses:  Principal Problem:   SI joint arthritis (HCC) Active Problems:   Allergic rhinitis   Vitamin D deficiency   Asthma   GERD (gastroesophageal reflux disease)   SI (sacroiliac) joint dysfunction   Procedures Performed: 02/21/2016- Dr. Marcelino Scot Left SI fusion  Trans-sacral screw hardware exchange x 2   Discharged Condition: good  Hospital Course:   40 year old black female well-known to the orthopedic trauma service after sustaining a complex pelvic ring fracture from a horse riding accident. Patient has undergone numerous procedures regarding persistent low back and pelvic pain. She presented on 02/21/2016 for left SI fusion. Patient was taken to the OR for the procedures noted above. She tolerated the procedures well. After surgery she was transferred to the orthopedic floor for observation, pain control and therapies. Patient's hospital stay was uncomplicated and she was discharged on postoperative day #2 with home health. Patient was discharged on aspirin for DVT PE prophylaxis as she is very mobile. Patient's pain was adequately controlled with time of discharge. She was tolerating regular diet and voiding without difficulty.  Consults: None  Significant Diagnostic Studies: labs:  Results for PATRICE, MATTHEW (MRN [id number]) as of 02/29/2016 10:10  Ref. Range 02/22/2016 05:53  Sodium Latest Ref Range: 135-145 mmol/L 140  Potassium Latest Ref Range: 3.5-5.1 mmol/L 3.6  Chloride Latest Ref Range: 101-111 mmol/L 103  CO2 Latest Ref Range: 22-32 mmol/L 27  BUN Latest Ref Range: 6-20 mg/dL 6  Creatinine Latest Ref Range:  0.44-1.00 mg/dL 0.76  Calcium Latest Ref Range: 8.9-10.3 mg/dL 8.4 (L)  EGFR (Non-African Amer.) Latest Ref Range: >60 mL/min >60  EGFR (African American) Latest Ref Range: >60 mL/min >60  Glucose Latest Ref Range: 65-99 mg/dL 108 (H)  Anion gap Latest Ref Range: 5-15  10  Calcium, Total (PTH) Latest Ref Range: 8.7-10.2 mg/dL 8.1 (L)  Phosphorus Latest Ref Range: 2.5-4.6 mg/dL 3.2  Magnesium Latest Ref Range: 1.7-2.4 mg/dL 1.6 (L)  Alkaline Phosphatase Latest Ref Range: 38-126 U/L 78  Albumin Latest Ref Range: 3.5-5.0 g/dL 2.9 (L)  AST Latest Ref Range: 15-41 U/L 30  ALT Latest Ref Range: 14-54 U/L 19  Total Protein Latest Ref Range: 6.5-8.1 g/dL 5.5 (L)  Total Bilirubin Latest Ref Range: 0.3-1.2 mg/dL 0.6  PREALBUMIN Latest Ref Range: 18-38 mg/dL 22.5  Vit D, 1,25-Dihydroxy Latest Ref Range: 19.9-79.3 pg/mL 46.3  Vitamin D, 25-Hydroxy Latest Ref Range: 30.0-100.0 ng/mL 17.7 (L)  WBC Latest Ref Range: 4.0-10.5 K/uL 7.2  RBC Latest Ref Range: 3.87-5.11 MIL/uL 3.22 (L)  Hemoglobin Latest Ref Range: 12.0-15.0 g/dL 9.6 (L)  HCT Latest Ref Range: 36.0-46.0 % 28.3 (L)  MCV Latest Ref Range: 78.0-100.0 fL 87.9  MCH Latest Ref Range: 26.0-34.0 pg 29.8  MCHC Latest Ref Range: 30.0-36.0 g/dL 33.9  RDW Latest Ref Range: 11.5-15.5 % 13.8  Platelets Latest Ref Range: 150-400 K/uL 335  Neutrophils Latest Units: % 70  Lymphocytes Latest Units: % 17  Monocytes Relative Latest Units: % 11  Eosinophil Latest Units: % 2  Basophil Latest Units: % 0  NEUT# Latest Ref Range: 1.7-7.7 K/uL 5.1  Lymphocyte # Latest Ref Range: 0.7-4.0 K/uL 1.2  Monocyte #  Latest Ref Range: 0.1-1.0 K/uL 0.8  Eosinophils Absolute Latest Ref Range: 0.0-0.7 K/uL 0.1  Basophils Absolute Latest Ref Range: 0.0-0.1 K/uL 0.0  PTH Latest Ref Range: 15-65 pg/mL 88 (H)  TSH Latest Ref Range: 0.350-4.500 uIU/mL 2.449  Nicotine Latest Units: ng/mL None Detected  Cotinine Latest Units: ng/mL None Detected     Treatments: IV  hydration, antibiotics: Ancef, analgesia: dilaudid, oxy IR, norco, anticoagulation: ASA and LMW heparin, therapies: PT, OT and RN and surgery: as above   Discharge Exam:  SPORTS MEDICINE AND JOINT REPLACEMENT  Lara Mulch, MD  [facility] PA-C [place], [place], [place]  [address]                             [phone]              PROGRESS NOTE  Subjective:             negative for Chest Pain             negative for Shortness of Breath             negative for Nausea/Vomiting               negative for Calf Pain             negative for Bowel Movement              Tolerating Diet: yes                       Patient reports pain as 6 on 0-10 scale.    Objective: Vital signs in last 24 hours:    Patient Vitals for the past 24 hrs:   BP  Temp  Temp src  Pulse  Resp  SpO2   02/23/16 0521  132/68 mmHg  98.1 F (36.7 C)  Oral  78  16  100 %   02/22/16 2142  128/70 mmHg  98.7 F (37.1 C)  Oral  (!) 112  16  100 %   02/22/16 1228  133/80 mmHg  98.2 F (36.8 C)  -  (!) 106  16  99 %      _0 {1959:LAST@    Intake/Output from previous day:    05/12 0701 - 05/13 0700 In: 580 [P.O.:580] Out: -     Intake/Output this shift:        Intake/Output      05/12 0701 - 05/13 0700 05/13 0701 - 05/14 0700    P.O. 580     I.V. (mL/kg)      Total Intake(mL/kg) 580 (6.5)     Urine (mL/kg/hr)      Blood      Total Output        Net +580            Urine Occurrence 4 x 0 x    Stool Occurrence  0 x       LABORATORY DATA:  Recent Labs   02/21/16 1039  02/21/16 1942  02/22/16 0553   WBC  5.5  11.5*  7.2   HGB  11.5*  10.5*  9.6*   HCT  34.3*  31.7*  28.3*   PLT  375  340  335     Recent Labs   02/21/16 1039  02/21/16 1942  02/22/16 0553   NA  141   --   140   K  3.8   --   3.6   CL  106   --   103   CO2  24   --   27   BUN  7   --   6   CREATININE  0.92  0.84  0.76   GLUCOSE  105*   --   108*   CALCIUM  9.0   --   8.4*     Recent Labs    Lab  Results   Component  Value  Date     INR  1.02  02/21/2016       Examination:  General appearance: alert and no distress Extremities: extremities normal, atraumatic, no cyanosis or edema  Wound Exam: clean, dry, intact   Drainage:  None: wound tissue dry  Motor Exam: Quadriceps and Hamstrings Intact  Sensory Exam: Superficial Peroneal, Deep Peroneal and Tibial normal   Assessment:     2 Days Post-Op  Procedure(s) (LRB): LEFT SACROILIAC JOINT FUSION (Left) HARDWARE REMOVAL LEFT SACROILIAC JOINT (Left)  ADDITIONAL DIAGNOSIS:   Principal Problem:   SI joint arthritis (HCC) Active Problems:   Allergic rhinitis   Vitamin D deficiency   Asthma   GERD (gastroesophageal reflux disease)   SI (sacroiliac) joint dysfunction  Acute Blood Loss Anemia   Plan: Physical Therapy as ordered Touch Down weight bearing  DVT Prophylaxis:  Aspirin  DISCHARGE PLAN: Home  DISCHARGE NEEDS: Walker and 3-in-1 comode seat   Expect D/C today once cleared by PT - per MD's order          Donia Ast 02/23/2016, 8:19 AM   Disposition: 01-Home or Self Care     Medication List    STOP taking these medications        arformoterol 15 MCG/2ML Nebu  Commonly known as:  BROVANA     oxyCODONE-acetaminophen 5-325 MG tablet  Commonly known as:  PERCOCET/ROXICET     polyethylene glycol packet  Commonly known as:  MIRALAX / GLYCOLAX      TAKE these medications        acetaminophen 325 MG tablet  Commonly known as:  TYLENOL  Take 2 tablets (650 mg total) by mouth every 4 (four) hours as needed for mild pain.     albuterol 108 (90 Base) MCG/ACT inhaler  Commonly known as:  PROVENTIL HFA;VENTOLIN HFA  Inhale 2 puffs into the lungs every 6 (six) hours as needed for wheezing or shortness of breath.     aspirin EC 325 MG tablet  Take 1 tablet (325 mg total) by mouth daily.     cyclobenzaprine 10 MG tablet  Commonly known as:  FLEXERIL  Take 1 tablet (10 mg total) by mouth  3 (three) times daily as needed for muscle spasms.     desipramine 50 MG tablet  Commonly known as:  NORPRAMIN  Take 50 mg by mouth at bedtime.     docusate sodium 100 MG capsule  Commonly known as:  COLACE  Take 1 capsule (100 mg total) by mouth 2 (two) times daily.     fluticasone 50 MCG/ACT nasal spray  Commonly known as:  FLONASE  USE TWO SPRAY(S) IN EACH NOSTRIL ONCE DAILY     fluticasone furoate-vilanterol 100-25 MCG/INH Aepb  Commonly known as:  BREO ELLIPTA  Inhale 1 puff into the lungs daily.     HYDROcodone-acetaminophen 10-325 MG tablet  Commonly known as:  NORCO  Take 1-2 tablets by mouth every 6 (six) hours as needed for severe  pain (breakthrough pain).     ipratropium-albuterol 0.5-2.5 (3) MG/3ML Soln  Commonly known as:  DUONEB  Take 3 mLs by nebulization every 6 (six) hours as needed (shortness of breath).     levocetirizine 5 MG tablet  Commonly known as:  XYZAL  TAKE ONE TABLET BY MOUTH ONCE DAILY IN THE EVENING     montelukast 10 MG tablet  Commonly known as:  SINGULAIR  TAKE ONE TABLET BY MOUTH ONCE DAILY IN THE MORNING     omeprazole 20 MG capsule  Commonly known as:  PRILOSEC  Take 1 capsule (20 mg total) by mouth daily.     oxyCODONE 5 MG immediate release tablet  Commonly known as:  ROXICODONE  Take 1-2 tablets (5-10 mg total) by mouth every 6 (six) hours as needed for breakthrough pain.     ranitidine 150 MG tablet  Commonly known as:  ZANTAC  TAKE ONE TABLET BY MOUTH TWICE DAILY           Follow-up Information    Follow up with HANDY,MICHAEL H, MD. Schedule an appointment as soon as possible for a visit in 2 weeks.   Specialty:  Orthopedic Surgery   Why:  For wound re-check, For suture removal   Contact information:   [address] [place] [address] [phone]       Discharge Instructions and Plan:  40 y/o female s/p pelvic ring fracture with L SI joint arthritis    - L SI joint arthritis s/p SI fusion              TDWB L leg             No ROM restrictions             Dressing changes PRN              Can leave uncovered if wounds dry               Ice prn               Therapy evals   - Pain management:             norco             Flexeril  - ABL anemia/Hemodynamics             Stable     - Medical issues               Home meds                         Noted changes by pharmacy   - DVT/PE prophylaxis:                          ASA at discharge (325 mg po daily x 6 weeks)  - ID:               periop abx completed   - Metabolic Bone Disease:             vitamin d and calcium citrate supplements   Vitamin d3 2000 IU BID  Vitamin c 500 mg po daily   - Activity:             As tolerated             TDWB L leg  - FEN/GI prophylaxis/Foley/Lines:  Diet as tolerated             pepcid  - Dispo:             follow up with ortho in 2 weeks    Signed:  Jari Pigg, PA-C [facility] [phone] (P) 02/22/2016, 2:00 PM

## 2016-02-22 NOTE — Evaluation (Signed)
Physical Therapy Evaluation Patient Details Name: Jodi [name] MRN: [id number] DOB: 14-Mar-1976 Today's Date: 02/22/2016   History of Present Illness  Patient is a 40 year old black female well-known to the orthopedic trauma service for a pelvic ring injury following a horse riding accident. Patient has undergone numerous surgeries but has had persistent low back pain related to her pelvic ring fracture. Patient presents today for fusion of her left sacroiliac joint.  Clinical Impression  Patient evaluated by Physical Therapy with no further acute PT needs identified. All education has been completed and the patient has no further questions. Pt has been through limited WB prior to this admission and demonstrates safety as well as TDWB without assist.  See below for any follow-up Physical Therapy or equipment needs. PT is signing off. Thank you for this referral.     Follow Up Recommendations Outpatient PT (when appropriate)    Equipment Recommendations  None recommended by PT    Recommendations for Other Services       Precautions / Restrictions Precautions Precautions: None Restrictions Weight Bearing Restrictions: Yes LLE Weight Bearing: Touchdown weight bearing      Mobility  Bed Mobility Overal bed mobility: Modified Independent             General bed mobility comments: pt able to scoot to EOB without assist  Transfers Overall transfer level: Modified independent Equipment used: Rolling walker (2 wheeled)             General transfer comment: pt stood to RW safely with appropriate placement of hands and LLE TDWB  Ambulation/Gait Ambulation/Gait assistance: Modified independent (Device/Increase time) Ambulation Distance (Feet): 20 Feet Assistive device: Rolling walker (2 wheeled) Gait Pattern/deviations: Step-to pattern     General Gait Details: pt maintain TDWB throughout ambulation, safe with RW  Stairs Stairs:  (discussed stairs)           Wheelchair Mobility    Modified Rankin (Stroke Patients Only)       Balance Overall balance assessment: No apparent balance deficits (not formally assessed)                                           Pertinent Vitals/Pain Pain Assessment: 0-10 Pain Score: 6  Pain Location: left hip Pain Descriptors / Indicators: Sore Pain Intervention(s): Limited activity within patient's tolerance;Monitored during session;Premedicated before session;Repositioned    Home Living Family/patient expects to be discharged to:: Private residence Living Arrangements: Spouse/significant other Available Help at Discharge: Family;Friend(s);Available PRN/intermittently Type of Home: House Home Access: Stairs to enter Entrance Stairs-Rails: Right;Left;Can reach both Entrance Stairs-Number of Steps: 5 Home Layout: One level Home Equipment: Walker - 2 wheels;Cane - single point;Bedside commode Additional Comments: has had differing WB statuses through this process from NWB to FWB and has managed well. Boyfriend has a few days off to be with her.     Prior Function Level of Independence: Independent         Comments: has been going to outpt PT to build up strength for this surgery     Hand Dominance        Extremity/Trunk Assessment   Upper Extremity Assessment: Overall WFL for tasks assessed           Lower Extremity Assessment: LLE deficits/detail   LLE Deficits / Details: hip flex 2+/5, ankle and knee WFL  Cervical / Trunk Assessment: Normal  Communication  Communication: No difficulties  Cognition Arousal/Alertness: Awake/alert Behavior During Therapy: WFL for tasks assessed/performed Overall Cognitive Status: Within Functional Limits for tasks assessed                      General Comments      Exercises        Assessment/Plan    PT Assessment Patent does not need any further PT services  PT Diagnosis Difficulty walking;Abnormality of  gait;Acute pain   PT Problem List    PT Treatment Interventions     PT Goals (Current goals can be found in the Care Plan section) Acute Rehab PT Goals Patient Stated Goal: return home PT Goal Formulation: All assessment and education complete, DC therapy    Frequency     Barriers to discharge        Co-evaluation               End of Session Equipment Utilized During Treatment: Gait belt Activity Tolerance: Patient tolerated treatment well Patient left: in chair;with call bell/phone within reach Nurse Communication: Mobility status         Time: 9528-41321148-1207 PT Time Calculation (min) (ACUTE ONLY): 19 min   Charges:   PT Evaluation $PT Eval Low Complexity: 1 Procedure     PT G Codes:       Lyanne CoVictoria Tatum Corl, PT  Acute Rehab Services  [phone]  Lyanne CoManess, Gerald Kuehl 02/22/2016, 12:20 PM

## 2016-02-22 NOTE — Progress Notes (Signed)
Occupational Therapy Evaluation Patient Details Name: Jodi [name] MRN: [id number] DOB: 09/01/1976 Today's Date: 02/22/2016    History of Present Illness Patient is a 40 year old black female well-known to the orthopedic trauma service for a pelvic ring injury following a horse riding accident. Patient has undergone numerous surgeries but has had persistent low back pain related to her pelvic ring fracture. Patient presents today for fusion of her left sacroiliac joint.   Clinical Impression   Making excellent progress with minimal complaints of pain. Pt able to verbalize understanding of compensatory techniques for ADL and functional mobility for ADL. Pt safe to D/C home when medically stable. OT signing off.     Follow Up Recommendations  No OT follow up;Supervision - Intermittent    Equipment Recommendations  None recommended by OT    Recommendations for Other Services       Precautions / Restrictions Precautions Precautions: None Restrictions Weight Bearing Restrictions: Yes LLE Weight Bearing: Touchdown weight bearing      Mobility Bed Mobility Overal bed mobility: Modified Independent             General bed mobility comments: pt able to scoot to EOB without assist  Transfers Overall transfer level: Modified independent Equipment used: Rolling walker (2 wheeled)                  Balance Overall balance assessment: No apparent balance deficits (not formally assessed)                                          ADL Overall ADL's : Needs assistance/impaired                                     Functional mobility during ADLs: Modified independent;Rolling walker General ADL Comments: close to baseline. Has AE from previous surgeries and is able to use it correctly. Has all necessary DME. Educated on Tourist information centre managershower trnafer technique     Vision     Perception     Praxis      Pertinent Vitals/Pain Pain Assessment:  0-10 Pain Score: 5  Pain Location: L hip Pain Descriptors / Indicators: Sore;Aching Pain Intervention(s): Limited activity within patient's tolerance     Hand Dominance     Extremity/Trunk Assessment Upper Extremity Assessment Upper Extremity Assessment: Overall WFL for tasks assessed   Lower Extremity Assessment Lower Extremity Assessment: Defer to PT evaluation   Cervical / Trunk Assessment Cervical / Trunk Assessment: Normal   Communication Communication Communication: No difficulties   Cognition Arousal/Alertness: Awake/alert Behavior During Therapy: WFL for tasks assessed/performed Overall Cognitive Status: Within Functional Limits for tasks assessed                     General Comments       Exercises       Shoulder Instructions      Home Living Family/patient expects to be discharged to:: Private residence Living Arrangements: Spouse/significant other Available Help at Discharge: Family;Friend(s);Available PRN/intermittently Type of Home: House Home Access: Stairs to enter Entergy CorporationEntrance Stairs-Number of Steps: 5 Entrance Stairs-Rails: Right;Left;Can reach both Home Layout: One level     Bathroom Shower/Tub: Producer, television/film/videoWalk-in shower   Bathroom Toilet: Standard Bathroom Accessibility: No   Home Equipment: Environmental consultantWalker - 2 wheels;Cane - single point;Bedside commode   Additional Comments: has  had differing WB statuses through this process from NWB to FWB and has managed well. Boyfriend has a few days off to be with her.       Prior Functioning/Environment Level of Independence: Independent        Comments: has been going to outpt PT to build up strength for this surgery    OT Diagnosis: Generalized weakness   OT Problem List: Decreased strength;Decreased range of motion;Decreased activity tolerance   OT Treatment/Interventions:      OT Goals(Current goals can be found in the care plan section) Acute Rehab OT Goals Patient Stated Goal: return home OT Goal  Formulation: All assessment and education complete, DC therapy  OT Frequency:     Barriers to D/C:            Co-evaluation              End of Session Equipment Utilized During Treatment: Rolling walker Nurse Communication: Mobility status  Activity Tolerance: Patient tolerated treatment well Patient left: in bed;with call bell/phone within reach   Time: 1605-1620 OT Time Calculation (min): 15 min Charges:  OT General Charges $OT Visit: 1 Procedure OT Evaluation $OT Eval Low Complexity: 1 Procedure G-Codes:    Lena Gores,HILLARY 2016-02-23, 5:53 PM  [facility], OTR/L  [phone] 02/23/2016

## 2016-02-22 NOTE — Progress Notes (Signed)
Orthopaedic Trauma Service Progress Note  Subjective  Doing well Eating lunch Sore but ok Ambulated to bathroom  Voided + flatus No BM  Review of Systems  Constitutional: Negative for fever and chills.  Respiratory: Negative for shortness of breath and wheezing.   Cardiovascular: Negative for chest pain and palpitations.  Gastrointestinal: Negative for nausea and vomiting.  Neurological: Negative for tingling and sensory change.     Objective   BP 133/80 mmHg  Pulse 106  Temp(Src) 98.2 F (36.8 C) (Oral)  Resp 16  Ht 5' 6"  (1.676 m)  Wt 89.359 kg (197 lb)  BMI 31.81 kg/m2  SpO2 99%  LMP 05/17/2012  Intake/Output      05/11 0701 - 05/12 0700 05/12 0701 - 05/13 0700   P.O.  340   I.V. (mL/kg) 1100 (12.3)    Total Intake(mL/kg) 1100 (12.3) 340 (3.8)   Urine (mL/kg/hr) 1300    Blood 30    Total Output 1330     Net -230 +340        Urine Occurrence 7 x 2 x     Labs Results for Jodi [name], Jodi [name] (MRN [id number]) as of 02/22/2016 13:50  Ref. Range 02/22/2016 05:53  Sodium Latest Ref Range: 135-145 mmol/L 140  Potassium Latest Ref Range: 3.5-5.1 mmol/L 3.6  Chloride Latest Ref Range: 101-111 mmol/L 103  CO2 Latest Ref Range: 22-32 mmol/L 27  BUN Latest Ref Range: 6-20 mg/dL 6  Creatinine Latest Ref Range: 0.44-1.00 mg/dL 0.76  Calcium Latest Ref Range: 8.9-10.3 mg/dL 8.4 (L)  EGFR (Non-African Amer.) Latest Ref Range: >60 mL/min >60  EGFR (African American) Latest Ref Range: >60 mL/min >60  Glucose Latest Ref Range: 65-99 mg/dL 108 (H)  Anion gap Latest Ref Range: 5-15  10  Phosphorus Latest Ref Range: 2.5-4.6 mg/dL 3.2  Magnesium Latest Ref Range: 1.7-2.4 mg/dL 1.6 (L)  Alkaline Phosphatase Latest Ref Range: 38-126 U/L 78  Albumin Latest Ref Range: 3.5-5.0 g/dL 2.9 (L)  AST Latest Ref Range: 15-41 U/L 30  ALT Latest Ref Range: 14-54 U/L 19  Total Protein Latest Ref Range: 6.5-8.1 g/dL 5.5 (L)  Total Bilirubin Latest Ref Range: 0.3-1.2 mg/dL 0.6   PREALBUMIN Latest Ref Range: 18-38 mg/dL 22.5  WBC Latest Ref Range: 4.0-10.5 K/uL 7.2  RBC Latest Ref Range: 3.87-5.11 MIL/uL 3.22 (L)  Hemoglobin Latest Ref Range: 12.0-15.0 g/dL 9.6 (L)  HCT Latest Ref Range: 36.0-46.0 % 28.3 (L)  MCV Latest Ref Range: 78.0-100.0 fL 87.9  MCH Latest Ref Range: 26.0-34.0 pg 29.8  MCHC Latest Ref Range: 30.0-36.0 g/dL 33.9  RDW Latest Ref Range: 11.5-15.5 % 13.8  Platelets Latest Ref Range: 150-400 K/uL 335  Neutrophils Latest Units: % 70  Lymphocytes Latest Units: % 17  Monocytes Relative Latest Units: % 11  Eosinophil Latest Units: % 2  Basophil Latest Units: % 0  NEUT# Latest Ref Range: 1.7-7.7 K/uL 5.1  Lymphocyte # Latest Ref Range: 0.7-4.0 K/uL 1.2  Monocyte # Latest Ref Range: 0.1-1.0 K/uL 0.8  Eosinophils Absolute Latest Ref Range: 0.0-0.7 K/uL 0.1  Basophils Absolute Latest Ref Range: 0.0-0.1 K/uL 0.0  TSH Latest Ref Range: 0.350-4.500 uIU/mL 2.449     Exam  Gen: resting comfortably in bed, NAD Lungs: clear anterior fields Cardiac: RRR, s1 and s2 Abd: + BS, NTND Pelvis/L LEx  Dressings with dry drainage  DPN, SPN, TN sensation intact   EHL, FHL, AT, PT, peroneals, gastroc motor intact  Ext warm  + DP pulse  No DCT  Assessment and Plan   POD/HD#: 1  40 y/o female s/p pelvic ring fracture with L SI joint arthritis   - L SI joint arthritis s/p SI fusion  TDWB L leg  No ROM restrictions  Dressing change tomorrow  Can leave uncovered if wounds dry   Ice prn   Therapy evals   - Pain management:  norco  Flexeril  - ABL anemia/Hemodynamics  Stable   Cbc in am   - Medical issues   Home meds   Noted changes by pharmacy   - DVT/PE prophylaxis:  lovenox while inpatient   ASA at discharge (325 mg po daily x 6 weeks)  - ID:   periop abx  - Metabolic Bone Disease:  Labs pending  - Activity:  As tolerated  TDWB L leg  - FEN/GI prophylaxis/Foley/Lines:  Diet as tolerated  pepcid  - Dispo:  Continue  with inpatient care  Likely ready for dc tomorrow     Jari Pigg, PA-C [facility] [phone] (P) [phone] (O) 02/22/2016 1:49 PM

## 2016-02-23 LAB — PTH, INTACT AND CALCIUM
CALCIUM TOTAL (PTH): 8.1 mg/dL — AB (ref 8.7–10.2)
PTH: 88 pg/mL — AB (ref 15–65)

## 2016-02-23 LAB — VITAMIN D 25 HYDROXY (VIT D DEFICIENCY, FRACTURES): VIT D 25 HYDROXY: 17.7 ng/mL — AB (ref 30.0–100.0)

## 2016-02-23 NOTE — Progress Notes (Addendum)
SPORTS MEDICINE AND JOINT REPLACEMENT  Georgena SpurlingStephen Lucey, MD   [facility] Charnell Peplinski PA-C [address], [place], [place]  [address]                             [phone]   PROGRESS NOTE  Subjective:  negative for Chest Pain  negative for Shortness of Breath  negative for Nausea/Vomiting   negative for Calf Pain  negative for Bowel Movement   Tolerating Diet: yes         Patient reports pain as 6 on 0-10 scale.    Objective: Vital signs in last 24 hours:   Patient Vitals for the past 24 hrs:  BP Temp Temp src Pulse Resp SpO2  02/23/16 0521 132/68 mmHg 98.1 F (36.7 C) Oral 78 16 100 %  02/22/16 2142 128/70 mmHg 98.7 F (37.1 C) Oral (!) 112 16 100 %  02/22/16 1228 133/80 mmHg 98.2 F (36.8 C) - (!) 106 16 99 %    @flow {1959:LAST@   Intake/Output from previous day:   05/12 0701 - 05/13 0700 In: 580 [P.O.:580] Out: -    Intake/Output this shift:       Intake/Output      05/12 0701 - 05/13 0700 05/13 0701 - 05/14 0700   P.O. 580    I.V. (mL/kg)     Total Intake(mL/kg) 580 (6.5)    Urine (mL/kg/hr)     Blood     Total Output       Net +580          Urine Occurrence 4 x 0 x   Stool Occurrence  0 x      LABORATORY DATA:  Recent Labs  02/21/16 1039 02/21/16 1942 02/22/16 0553  WBC 5.5 11.5* 7.2  HGB 11.5* 10.5* 9.6*  HCT 34.3* 31.7* 28.3*  PLT 375 340 335    Recent Labs  02/21/16 1039 02/21/16 1942 02/22/16 0553  NA 141  --  140  K 3.8  --  3.6  CL 106  --  103  CO2 24  --  27  BUN 7  --  6  CREATININE 0.92 0.84 0.76  GLUCOSE 105*  --  108*  CALCIUM 9.0  --  8.4*   Lab Results  Component Value Date   INR 1.02 02/21/2016    Examination:  General appearance: alert and no distress Extremities: extremities normal, atraumatic, no cyanosis or edema  Wound Exam: clean, dry, intact   Drainage:  None: wound tissue dry  Motor Exam: Quadriceps and Hamstrings Intact  Sensory Exam: Superficial Peroneal, Deep Peroneal and Tibial  normal   Assessment:    2 Days Post-Op  Procedure(s) (LRB): LEFT SACROILIAC JOINT FUSION (Left) HARDWARE REMOVAL LEFT SACROILIAC JOINT (Left)  ADDITIONAL DIAGNOSIS:  Principal Problem:   SI joint arthritis (HCC) Active Problems:   Allergic rhinitis   Vitamin D deficiency   Asthma   GERD (gastroesophageal reflux disease)   SI (sacroiliac) joint dysfunction  Acute Blood Loss Anemia   Plan: Physical Therapy as ordered Touch Down weight bearing  DVT Prophylaxis:  Aspirin  DISCHARGE PLAN: Home  DISCHARGE NEEDS: Walker and 3-in-1 comode seat   Expect D/C today once cleared by PT - per MD's order         Guy Sandiferolby Alan Bailie Christenbury 02/23/2016, 8:19 AM

## 2016-02-23 NOTE — Care Management Note (Signed)
Case Management Note  Patient Details  Name: Jodi [name] MRN: [id number] Date of Birth: 07/05/1976  Subjective/Objective:   40 y.o. F admitted for Fusion L Sacroiliac Joint 02/21/2016 following previous Horse Riding Injury. PT/OT have no recommendation for follow up HH. CM spoke with pt by phone to confirm this, as well as, no need for DME. Jodi [name] reports this is also her understanding as she is TDWB at this time. She understands she may need Outpt PT at a later date TBD at a follow up appt with Carola FrostHandy, MD. No questions voiced. Pt is aware that she can call either CM or office of the MD if questions of problems with mobility arise in the future.                  Action/Plan:CM will sign off at this time but will be available should discharge needs arise.    Expected Discharge Date:                  Expected Discharge Plan:  Home/Self Care  In-House Referral:     Discharge planning Services  CM Consult  Post Acute Care Choice:  NA Choice offered to:     DME Arranged:    DME Agency:     HH Arranged:    HH Agency:     Status of Service:     Medicare Important Message Given:    Date Medicare IM Given:    Medicare IM give by:    Date Additional Medicare IM Given:    Additional Medicare Important Message give by:     If discussed at Long Length of Stay Meetings, dates discussed:    Additional Comments:  Yvone NeuCrutchfield, [name] M, RN 02/23/2016, 9:57 AM

## 2016-02-25 LAB — CALCITRIOL (1,25 DI-OH VIT D): Vit D, 1,25-Dihydroxy: 46.3 pg/mL (ref 19.9–79.3)

## 2016-02-27 ENCOUNTER — Encounter (HOSPITAL_COMMUNITY): Payer: Self-pay | Admitting: Orthopedic Surgery

## 2016-02-27 LAB — NICOTINE/COTININE METABOLITES
Cotinine: NOT DETECTED ng/mL
NICOTINE: NOT DETECTED ng/mL

## 2016-03-13 NOTE — Op Note (Signed)
NAME:  Jodi [name], Jodi [name]             ACCOUNT NO.:  [id number]  MEDICAL RECORD NO.:  [id number]  LOCATION:  [id number]                        FACILITY:  [facility]  PHYSICIAN:  Doralee AlbinoMichael H. Carola FrostHandy, M.D. DATE OF BIRTH:  [birth year]  DATE OF PROCEDURE:  02/21/2016 DATE OF DISCHARGE:  02/23/2016                              OPERATIVE REPORT   PREOPERATIVE DIAGNOSES: 1. Left sacroiliac joint posttraumatic arthritis. 2. Cycled sacroiliac joint hardware.  POSTOPERATIVE DIAGNOSES: 1. Left sacroiliac joint posttraumatic arthritis. 2. Cycled sacroiliac joint hardware.  PROCEDURES PERFORMED: 1. Left sacroiliac joint fusion/arthrodesis with autograft. 2. Removal of hardware with exchange of SI screws.  SURGEON:  Doralee AlbinoMichael H. Carola FrostHandy, M.D.  ASSISTANT:  Mearl LatinKeith W. Paul, PA-C.  ANESTHESIA:  General.  COMPLICATIONS:  A fractured joint debridement device.  DISPOSITION:  To PACU.  CONDITION:  Stable.  BRIEF SUMMARY OF INDICATIONS FOR PROCEDURE:  Jodi [name] is a very pleasant 40 year old female, who sustained an LC3 type injury to her pelvis with an open book type pattern on the left that was occult and not readily recognized because of the lack of displacement.  She underwent delayed repair of this injury and initially did outstanding, but then subsequently developed acute onset of pain and by mechanical symptoms following a high step-up into her barn.  She underwent serial procedures that identified the SI joint as the etiology.  I did discuss with her various means of methods for treatment and she had unrelenting pain that was progressive and failed conservative measures.  She did wish to proceed with autografting using the Zyga device.  This included autograft harvest from her pelvic wing as well as additional graft replaced directly into the joint.  Risks included infection, nerve injury, vessel injury, DVT, PE, failure of the arthrodesis, and others. We also discussed whether to remove and  exchange the SI screws, which she did wish to proceed with.  BRIEF SUMMARY OF PROCEDURE:  The patient was taken to the operating room where general anesthesia was induced.  She was positioned supine and the left side of her pelvis scrubbed with chlorhexidine scrub brush and Betadine scrub and paint.  I began with remaking the old incisions for the SI screws and carefully used a guidewire to insert into the screws so that they could be identified.  The guide pins were then left in place to assist with orientation of the Zyga device.  This involved a 3 cm incision through which serial dilation was performed going down to the iliac wing in the correct trajectory between the S1 and S2 foramina and completely perpendicular to the SI joint.  The docking device was engaged in the outer ilium and then the drill passed into the iliac wing.  The bone was then harvested from the iliac wing and secured in a specimen cup.  I then introduced a series of debriding devices that could be extended in a circular fashion and sequentially went through them with excellent effect removing the cartilage from the joint.  I did the 1st circumferential joint debrided then the 2nd and the 3rd.  This proceeded very well and with the 3rd; however, the device appeared to catch during the revolution close to the SI screw.  At that point, the tip of the device actually fractured off within the SI joint.  I did make numerous attempts to remove this with a variety of devices including the long pituitary long arthroscopic instruments and baskets as well as a right angle but all were not adequate for removal.  Because of the anticipated fusion, this was not deemed to be worthy of a separate approach to remove this which would likely produce more harm or potential for harm than potential benefit with removal.  Following the debridement, the joint was irrigated thoroughly removing much of this cartilage debris out to the  cannula.  Following this, the autograft that had been harvested with sequential passes through the pelvic tables in the ilium was then deposited into the joint using the special graft delivery device.  This gave excellent and visible fill into the area and was followed with additional infuse sponge and some cancellous graft because of the magnitude of the SI joint.  With visualization of the graft, I then continued with placement of the large screw and drove this across.  This was done after backing out the screws so that there was no additional hardware extending across the SI joint to allow for additional compression there.  The guide pins were left in place.  The screws were initially used for the SI joint were removed and replaced. I did get 5 mm longer on the S2 as this has been confirmed by the CT scan to allow for a little more length.  Final images showed excellent hardware position, graft fill of the SI joint, and replacement position of the SI screws, which again were swapped for fresh hardware and a new set of cycles.  Jodi Morita, PA-C, assisted me throughout and his assistance was necessary as the cannula had to be held in place and secured while instrumentation proceeded for the actual fusion.  PROGNOSIS:  Jodi [name] will be nonweightbearing to touchdown weightbearing for the next 8 weeks with then graduated weightbearing thereafter.  We anticipate her to go on to fusion of the joint given the preparation and excellent fill of the autograft.  She will be informed of the broken device within the joint, which we would not anticipate to produce long-term symptoms, but of course, will be followed closely.     Doralee Albino. Carola Frost, M.D.     [facility]/MEDQ  D:  03/12/2016  T:  03/13/2016  Job:  [id number]

## 2016-03-23 ENCOUNTER — Other Ambulatory Visit: Payer: Self-pay | Admitting: Family Medicine

## 2016-04-26 ENCOUNTER — Other Ambulatory Visit: Payer: Self-pay | Admitting: Family Medicine

## 2016-06-07 ENCOUNTER — Other Ambulatory Visit: Payer: Self-pay | Admitting: Family Medicine

## 2016-12-04 DIAGNOSIS — J45901 Unspecified asthma with (acute) exacerbation: Secondary | ICD-10-CM | POA: Diagnosis not present

## 2017-01-05 ENCOUNTER — Ambulatory Visit (INDEPENDENT_AMBULATORY_CARE_PROVIDER_SITE_OTHER): Payer: 59 | Admitting: Family Medicine

## 2017-01-05 ENCOUNTER — Encounter: Payer: Self-pay | Admitting: Family Medicine

## 2017-01-05 VITALS — BP 148/90 | HR 100 | Temp 97.3°F | Resp 20 | Ht 66.0 in | Wt 207.1 lb

## 2017-01-05 DIAGNOSIS — R030 Elevated blood-pressure reading, without diagnosis of hypertension: Secondary | ICD-10-CM | POA: Diagnosis not present

## 2017-01-05 DIAGNOSIS — J4541 Moderate persistent asthma with (acute) exacerbation: Secondary | ICD-10-CM | POA: Diagnosis not present

## 2017-01-05 MED ORDER — PREDNISONE 20 MG PO TABS
20.0000 mg | ORAL_TABLET | Freq: Two times a day (BID) | ORAL | 0 refills | Status: DC
Start: 2017-01-05 — End: 2017-09-24

## 2017-01-05 MED ORDER — LEVOFLOXACIN 750 MG PO TABS: 750.0000 mg | ORAL_TABLET | Freq: Every day | ORAL | 0 refills | Status: DC

## 2017-01-05 NOTE — Progress Notes (Signed)
Chief Complaint  Patient presents with  . Sinus Problem    x 5 days   History asthma not using maintenance inhalers Now with URI and cough and wheeze for 5 d Chills but no fever Using albuterol, flonase, singulair and xyzal along with OTC cold meds States she usu needs prednisone shot and pred oral and antibiotics to get over exacerbation due to infection  Patient Active Problem List   Diagnosis Date Noted  . SI joint arthritis (HCC) 02/21/2016  . SI (sacroiliac) joint dysfunction 09/04/2015  . Fall from horse 07/30/2015  . Lumbar transverse process fracture (HCC) 07/30/2015  . Pelvic ring fracture (HCC) 07/28/2015  . Primary generalized (osteo)arthritis 03/13/2015  . Asthma 11/17/2014  . GERD (gastroesophageal reflux disease) 11/17/2014  . Vitamin D deficiency 11/04/2014  . Dyslipidemia 08/11/2012  . 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 Allergic rhinitis 02/19/2010    Outpatient Encounter Prescriptions as of 01/05/2017  Medication Sig  . acetaminophen (TYLENOL) 325 MG tablet Take 2 tablets (650 mg total) by mouth every 4 (four) hours as needed for mild pain. (Patient taking differently: Take 650 mg by mouth 2 (two) times daily as needed for mild pain. )  . albuterol (PROVENTIL HFA;VENTOLIN HFA) 108 (90 BASE) MCG/ACT inhaler Inhale 2 puffs into the lungs every 6 (six) hours as needed for wheezing or shortness of breath.  Marland Kitchen. aspirin EC 325 MG tablet Take 1 tablet (325 mg total) by mouth daily.  . celecoxib (CELEBREX) 100 MG capsule Take 100 mg by mouth daily.  Marland Kitchen. desipramine (NORPRAMIN) 50 MG tablet Take 50 mg by mouth at bedtime.  . dicyclomine (BENTYL) 20 MG tablet Take 20 mg by mouth.  . docusate sodium (COLACE) 100 MG capsule Take 1 capsule (100 mg total) by mouth 2 (two) times daily. (Patient taking differently: Take 100 mg by mouth daily as needed for mild constipation. )  . fluticasone (FLONASE) 50 MCG/ACT nasal spray USE TWO SPRAY(S) IN EACH NOSTRIL ONCE DAILY  .  Fluticasone Furoate-Vilanterol 100-25 MCG/INH AEPB Inhale 1 puff into the lungs daily.  Marland Kitchen. ipratropium-albuterol (DUONEB) 0.5-2.5 (3) MG/3ML SOLN Take 3 mLs by nebulization every 6 (six) hours as needed (shortness of breath).   Marland Kitchen. levocetirizine (XYZAL) 5 MG tablet TAKE ONE TABLET BY MOUTH ONCE DAILY IN THE EVENING  . montelukast (SINGULAIR) 10 MG tablet TAKE ONE TABLET BY MOUTH IN THE MORNING  . omeprazole (PRILOSEC) 20 MG capsule Take 1 capsule (20 mg total) by mouth daily.  . ranitidine (ZANTAC) 150 MG tablet TAKE ONE TABLET BY MOUTH TWICE DAILY  . levofloxacin (LEVAQUIN) 750 MG tablet Take 1 tablet (750 mg total) by mouth daily.  . predniSONE (DELTASONE) 20 MG tablet Take 1 tablet (20 mg total) by mouth 2 (two) times daily with a meal.   No facility-administered encounter medications on file as of 01/05/2017.     Allergies  Allergen Reactions  . Compazine [Prochlorperazine] Other (See Comments)    Can not control facial muscles, face swells, tongue sticks out, lost oral control    Review of Systems  Constitutional: Positive for chills and fatigue. Negative for activity change, appetite change and unexpected weight change.  HENT: Positive for ear pain, postnasal drip, rhinorrhea, sinus pain, sinus pressure and sore throat. Negative for dental problem.   Eyes: Negative for redness and visual disturbance.  Respiratory: Positive for cough, shortness of breath and wheezing.   Cardiovascular: Negative for chest pain, palpitations and leg swelling.  Gastrointestinal: Negative for abdominal pain,  constipation and diarrhea.  Genitourinary: Negative for difficulty urinating, frequency and menstrual problem.  Musculoskeletal: Negative for arthralgias and back pain.  Neurological: Negative for dizziness and headaches.  Psychiatric/Behavioral: Negative for dysphoric mood and sleep disturbance. The patient is not nervous/anxious.     BP (!) 148/90 (BP Location: Right Arm, Patient Position: Sitting,  Cuff Size: Large)   Pulse 100   Temp 97.3 F (36.3 C) (Temporal)   Resp 20   Ht 5\' 6"  (1.676 m)   Wt 207 lb 1.3 oz (93.9 kg)   LMP 05/17/2012   SpO2 95%   BMI 33.42 kg/m   Physical Exam  Constitutional: She is oriented to person, place, and time. She appears well-developed and well-nourished.  HENT:  Head: Normocephalic and atraumatic.  Right Ear: External ear normal.  Left Ear: External ear normal.  Mouth/Throat: Oropharynx is clear and moist.  Post pharynx mildly injected.  No sinus tenderness  Eyes: Conjunctivae are normal. Pupils are equal, round, and reactive to light.  Neck: Normal range of motion. Neck supple. No thyromegaly present.  Cardiovascular: Normal rate, regular rhythm and normal heart sounds.   Pulmonary/Chest: Effort normal. No respiratory distress. She has wheezes.  Abdominal: Soft. Bowel sounds are normal.  Musculoskeletal: Normal range of motion. She exhibits no edema.  Lymphadenopathy:    She has no cervical adenopathy.  Neurological: She is alert and oriented to person, place, and time.  Gait normal  Skin: Skin is warm and dry.  Psychiatric: She has a normal mood and affect. Her behavior is normal. Thought content normal.  Nursing note and vitals reviewed.   ASSESSMENT/PLAN:  1. Moderate persistent asthma with exacerbation Return to pulmonary doctor  2. Elevated blood-pressure reading without diagnosis of hypertension discussed   Patient Instructions  Take the antibiotic as directed Take the prednisone as directed Push fluids Call if not better in a few days   Eustace Moore, MD

## 2017-01-05 NOTE — Patient Instructions (Signed)
Take the antibiotic as directed Take the prednisone as directed Push fluids Call if not better in a few days

## 2017-01-21 DIAGNOSIS — M533 Sacrococcygeal disorders, not elsewhere classified: Secondary | ICD-10-CM | POA: Diagnosis not present

## 2017-02-19 DIAGNOSIS — J309 Allergic rhinitis, unspecified: Secondary | ICD-10-CM | POA: Diagnosis not present

## 2017-02-19 DIAGNOSIS — K210 Gastro-esophageal reflux disease with esophagitis: Secondary | ICD-10-CM | POA: Diagnosis not present

## 2017-02-19 DIAGNOSIS — J45901 Unspecified asthma with (acute) exacerbation: Secondary | ICD-10-CM | POA: Diagnosis not present

## 2017-02-24 ENCOUNTER — Other Ambulatory Visit (HOSPITAL_COMMUNITY): Payer: Self-pay | Admitting: Unknown Physician Specialty

## 2017-02-24 DIAGNOSIS — Z1231 Encounter for screening mammogram for malignant neoplasm of breast: Secondary | ICD-10-CM

## 2017-03-11 ENCOUNTER — Ambulatory Visit (HOSPITAL_COMMUNITY)
Admission: RE | Admit: 2017-03-11 | Discharge: 2017-03-11 | Disposition: A | Discharge status: Home / Self Care | Payer: 59 | Source: Ambulatory Visit | Attending: Unknown Physician Specialty | Admitting: Unknown Physician Specialty

## 2017-03-11 DIAGNOSIS — Z1231 Encounter for screening mammogram for malignant neoplasm of breast: Secondary | ICD-10-CM

## 2017-03-18 ENCOUNTER — Other Ambulatory Visit (HOSPITAL_COMMUNITY): Payer: Self-pay | Admitting: Unknown Physician Specialty

## 2017-03-18 DIAGNOSIS — R928 Other abnormal and inconclusive findings on diagnostic imaging of breast: Secondary | ICD-10-CM

## 2017-03-31 ENCOUNTER — Ambulatory Visit (HOSPITAL_COMMUNITY)
Admission: RE | Admit: 2017-03-31 | Discharge: 2017-03-31 | Disposition: A | Discharge status: Home / Self Care | Payer: 59 | Source: Ambulatory Visit | Attending: Unknown Physician Specialty | Admitting: Unknown Physician Specialty

## 2017-03-31 DIAGNOSIS — R928 Other abnormal and inconclusive findings on diagnostic imaging of breast: Secondary | ICD-10-CM

## 2017-03-31 DIAGNOSIS — R922 Inconclusive mammogram: Secondary | ICD-10-CM | POA: Diagnosis not present

## 2017-04-01 DIAGNOSIS — R350 Frequency of micturition: Secondary | ICD-10-CM | POA: Diagnosis not present

## 2017-04-01 DIAGNOSIS — Z01419 Encounter for gynecological examination (general) (routine) without abnormal findings: Secondary | ICD-10-CM | POA: Diagnosis not present

## 2017-04-20 ENCOUNTER — Telehealth: Payer: Self-pay

## 2017-04-20 NOTE — Telephone Encounter (Signed)
Pt called with an exacerbation of asthma, has been using her rxd meds w/o relief. Would like to be seen today, but no appts. Available. Pt is going to urgent care of [facility].

## 2017-04-27 ENCOUNTER — Encounter (HOSPITAL_COMMUNITY): Payer: Self-pay

## 2017-04-27 ENCOUNTER — Emergency Department (HOSPITAL_COMMUNITY)
Admission: EM | Admit: 2017-04-27 | Discharge: 2017-04-27 | Disposition: A | Discharge status: Home / Self Care | Payer: 59 | Attending: Emergency Medicine | Admitting: Emergency Medicine

## 2017-04-27 ENCOUNTER — Emergency Department (HOSPITAL_COMMUNITY): Payer: 59

## 2017-04-27 DIAGNOSIS — M545 Low back pain, unspecified: Secondary | ICD-10-CM

## 2017-04-27 DIAGNOSIS — Z87891 Personal history of nicotine dependence: Secondary | ICD-10-CM | POA: Diagnosis not present

## 2017-04-27 DIAGNOSIS — Z79899 Other long term (current) drug therapy: Secondary | ICD-10-CM | POA: Insufficient documentation

## 2017-04-27 DIAGNOSIS — J45909 Unspecified asthma, uncomplicated: Secondary | ICD-10-CM | POA: Insufficient documentation

## 2017-04-27 DIAGNOSIS — R109 Unspecified abdominal pain: Secondary | ICD-10-CM | POA: Diagnosis not present

## 2017-04-27 DIAGNOSIS — R1032 Left lower quadrant pain: Secondary | ICD-10-CM | POA: Diagnosis not present

## 2017-04-27 LAB — BASIC METABOLIC PANEL
ANION GAP: 9 (ref 5–15)
BUN: 14 mg/dL (ref 6–20)
CHLORIDE: 100 mmol/L — AB (ref 101–111)
CO2: 28 mmol/L (ref 22–32)
Calcium: 9.2 mg/dL (ref 8.9–10.3)
Creatinine, Ser: 1.01 mg/dL — ABNORMAL HIGH (ref 0.44–1.00)
GFR calc Af Amer: >60 mL/min (ref >60)
GLUCOSE: 90 mg/dL (ref 65–99)
POTASSIUM: 3.2 mmol/L — AB (ref 3.5–5.1)
Sodium: 137 mmol/L (ref 135–145)

## 2017-04-27 LAB — URINALYSIS, ROUTINE W REFLEX MICROSCOPIC
Bilirubin Urine: NEGATIVE
Glucose, UA: NEGATIVE mg/dL
Hgb urine dipstick: NEGATIVE
Ketones, ur: NEGATIVE mg/dL
Leukocytes, UA: NEGATIVE
Nitrite: NEGATIVE
PROTEIN: NEGATIVE mg/dL
SPECIFIC GRAVITY, URINE: 1.014 (ref 1.005–1.030)
pH: 6 (ref 5.0–8.0)

## 2017-04-27 LAB — CBC WITH DIFFERENTIAL/PLATELET
BASOS ABS: 0 10*3/uL (ref 0.0–0.1)
Basophils Relative: 0 %
Eosinophils Absolute: 0 10*3/uL (ref 0.0–0.7)
Eosinophils Relative: 0 %
HCT: 35.6 % — ABNORMAL LOW (ref 36.0–46.0)
HEMOGLOBIN: 12.3 g/dL (ref 12.0–15.0)
LYMPHS ABS: 2.6 10*3/uL (ref 0.7–4.0)
LYMPHS PCT: 27 %
MCH: 29.1 pg (ref 26.0–34.0)
MCHC: 34.6 g/dL (ref 30.0–36.0)
MCV: 84.4 fL (ref 78.0–100.0)
Monocytes Absolute: 0.6 10*3/uL (ref 0.1–1.0)
Monocytes Relative: 7 %
NEUTROS PCT: 66 %
Neutro Abs: 6.2 10*3/uL (ref 1.7–7.7)
PLATELETS: 406 10*3/uL — AB (ref 150–400)
RBC: 4.22 MIL/uL (ref 3.87–5.11)
RDW: 14.5 % (ref 11.5–15.5)
WBC: 9.4 10*3/uL (ref 4.0–10.5)

## 2017-04-27 MED ORDER — NAPROXEN 500 MG PO TABS: 500.0000 mg | ORAL_TABLET | Freq: Two times a day (BID) | ORAL | 0 refills | Status: DC

## 2017-04-27 MED ORDER — CYCLOBENZAPRINE HCL 10 MG PO TABS: 10.0000 mg | ORAL_TABLET | Freq: Two times a day (BID) | ORAL | 0 refills | Status: DC | PRN

## 2017-04-27 NOTE — Discharge Instructions (Signed)
Make appointment to follow-up with your regular doctor if not improved over the next few days. Work note provided. Extensive workup here today without evidence of kidney infection or kidney stone. Most likely musculoskeletal back. Rest for the next 24 hours. Take medicines as directed.

## 2017-04-27 NOTE — ED Triage Notes (Signed)
Reports of left flank pain since Friday with frequent urination. Denies fevers.

## 2017-04-27 NOTE — ED Provider Notes (Signed)
[facility] DEPT Provider Note   CSN: [id number] Arrival date & time: 04/27/17  1042     History   Chief Complaint Chief Complaint  Patient presents with  . Flank Pain    HPI Jodi [name] is a 41 y.o. female.  Patient with complaint of left-sided flank pain CVA pain since Friday. Associated with urinating more frequently. No history of kidney stones. Did have nausea yesterday no vomiting. No fevers. No history of injury.      Past Medical History:  Diagnosis Date  . Abdominal pain, chronic, right upper quadrant   . Acute recurrent maxillary sinusitis 11/04/2014  . Allergic rhinitis   . Anemia   . Arthritis    knee  . Asthma    Cough variant  . Asthma   . Asthma   . Back pain   . Chronic pain syndrome 06/08/2013  . DVT (deep venous thrombosis) (HCC) 10 yrs ago  . Dyslipidemia 08/11/2012  . Eczema   . GERD (gastroesophageal reflux disease)   . Heart murmur    as a child  . Hyperlipidemia   . IBS (irritable bowel syndrome)   . 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 Pneumonia   . Shortness of breath    SOB even without exertion at times  . Sickle-cell trait [facility])     Patient Active Problem List   Diagnosis Date Noted  . SI joint arthritis (HCC) 02/21/2016  . SI (sacroiliac) joint dysfunction 09/04/2015  . Fall from horse 07/30/2015  . Lumbar transverse process fracture (HCC) 07/30/2015  . Pelvic ring fracture (HCC) 07/28/2015  . Primary generalized (osteo)arthritis 03/13/2015  . Asthma 11/17/2014  . GERD (gastroesophageal reflux disease) 11/17/2014  . Vitamin D deficiency 11/04/2014  . Dyslipidemia 08/11/2012  . 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 Allergic rhinitis 02/19/2010    Past Surgical History:  Procedure Laterality Date  . ABDOMINAL HYSTERECTOMY  4-5 yrs ago  . ABDOMINAL HYSTERECTOMY    . blood clot removed from lower abdomen  2000  . CHOLECYSTECTOMY  11/12/2012   Procedure: LAPAROSCOPIC CHOLECYSTECTOMY;  Surgeon: Dalia Heading, MD;  Location: [facility] ORS;  Service: General;  Laterality: N/A;  . CHOLECYSTECTOMY    . COLONOSCOPY  11/04/2012   Procedure: COLONOSCOPY;  Surgeon: Malissa Hippo, MD;  Location: [facility] ENDO SUITE;  Service: Endoscopy;  Laterality: N/A;  915  . cyst removed from ovary and fluid removed from tubes  2000  . ESOPHAGOGASTRODUODENOSCOPY  10/22/2012   Procedure: ESOPHAGOGASTRODUODENOSCOPY (EGD);  Surgeon: Malissa Hippo, MD;  Location: [facility] ENDO SUITE;  Service: Endoscopy;  Laterality: N/A;  100  . HARDWARE REMOVAL Left 02/21/2016   Procedure: HARDWARE REMOVAL LEFT SACROILIAC JOINT;  Surgeon: Myrene Galas, MD;  Location: [facility] OR;  Service: Orthopedics;  Laterality: Left;  . ORIF PELVIC FRACTURE N/A 07/30/2015   Procedure: OPEN REDUCTION INTERNAL FIXATION (ORIF) PELVIC FRACTURE;  Surgeon: Myrene Galas, MD;  Location: [facility] OR;  Service: Orthopedics;  Laterality: N/A;  . PARTIAL HYSTERECTOMY  2009  . SACRO-ILIAC PINNING Right 07/30/2015   Procedure: Loyal Gambler;  Surgeon: Myrene Galas, MD;  Location: [facility] OR;  Service: Orthopedics;  Laterality: Right;  . SACRO-ILIAC PINNING Left 09/04/2015   Procedure: LEFT TO RIGHT TRANS SACRO-ILIAC SCREW;  Surgeon: Myrene Galas, MD;  Location: [facility] OR;  Service: Orthopedics;  Laterality: Left;  . SACROILIAC JOINT FUSION Left 02/21/2016   Procedure: LEFT SACROILIAC JOINT FUSION;  Surgeon: Myrene Galas, MD;  Location: [facility] OR;  Service: Orthopedics;  Laterality: Left;  . TUBAL LIGATION  1999    OB History    Gravida Para Term Preterm AB Living   0 0 0 0 0     SAB TAB Ectopic Multiple Live Births   0 0 0           Home Medications    Prior to Admission medications   Medication Sig Start Date End Date Taking? Authorizing Provider  albuterol (PROVENTIL HFA;VENTOLIN HFA) 108 (90 BASE) MCG/ACT inhaler Inhale 2 puffs into the lungs every 6 (six) hours as needed for wheezing or shortness of breath. 05/08/15  Yes Kerri Perches, MD  celecoxib (CELEBREX) 100  MG capsule Take 100 mg by mouth daily.   Yes [provider]  desipramine (NORPRAMIN) 50 MG tablet Take 50 mg by mouth at bedtime.   Yes [provider]  fluticasone (FLONASE) 50 MCG/ACT nasal spray USE TWO SPRAY(S) IN EACH NOSTRIL ONCE DAILY 05/09/15  Yes Kerri Perches, MD  ipratropium-albuterol (DUONEB) 0.5-2.5 (3) MG/3ML SOLN Take 3 mLs by nebulization every 6 (six) hours as needed (shortness of breath).    Yes [provider]  levocetirizine (XYZAL) 5 MG tablet TAKE ONE TABLET BY MOUTH ONCE DAILY IN THE EVENING 04/28/16  Yes Kerri Perches, MD  montelukast (SINGULAIR) 10 MG tablet TAKE ONE TABLET BY MOUTH IN THE MORNING 04/28/16  Yes Kerri Perches, MD  naproxen sodium (ANAPROX) 220 MG tablet Take 440 mg by mouth 2 (two) times daily with a meal.   Yes [provider]  omeprazole (PRILOSEC) 20 MG capsule Take 1 capsule (20 mg total) by mouth daily. 01/05/14  Yes Setzer, Terri L, NP  predniSONE (STERAPRED UNI-PAK 21 TAB) 10 MG (21) TBPK tablet Take 10-40 mg by mouth daily. Take 4 tablets for 3 days, then 3 tablets for 3 days, then 2 tablets for 3 days, and then 1 tablet for 3 day   Yes [provider]  acetaminophen (TYLENOL) 325 MG tablet Take 2 tablets (650 mg total) by mouth every 4 (four) hours as needed for mild pain. Patient not taking: Reported on 04/27/2017 08/03/15   Ashok Norris, NP  aspirin EC 325 MG tablet Take 1 tablet (325 mg total) by mouth daily. Patient not taking: Reported on 04/27/2017 02/22/16   [name], PA-C  cyclobenzaprine (FLEXERIL) 10 MG tablet Take 1 tablet (10 mg total) by mouth 2 (two) times daily as needed for muscle spasms. 04/27/17   Vanetta Mulders, MD  dicyclomine (BENTYL) 20 MG tablet Take 20 mg by mouth. 08/13/16 08/13/17  [provider]  docusate sodium (COLACE) 100 MG capsule Take 1 capsule (100 mg total) by mouth 2 (two) times daily. Patient not taking: Reported on 04/27/2017 08/03/15   Riebock,  Anette Riedel, NP  Fluticasone Furoate-Vilanterol 100-25 MCG/INH AEPB Inhale 1 puff into the lungs daily. Patient not taking: Reported on 04/27/2017 11/18/14   Elliot Cousin, MD  levofloxacin (LEVAQUIN) 750 MG tablet Take 1 tablet (750 mg total) by mouth daily. Patient not taking: Reported on 04/27/2017 01/05/17   Eustace Moore, MD  naproxen (NAPROSYN) 500 MG tablet Take 1 tablet (500 mg total) by mouth 2 (two) times daily. 04/27/17   Vanetta Mulders, MD  predniSONE (DELTASONE) 20 MG tablet Take 1 tablet (20 mg total) by mouth 2 (two) times daily with a meal. 01/05/17   Eustace Moore, MD  ranitidine (ZANTAC) 150 MG tablet TAKE ONE TABLET BY MOUTH TWICE DAILY Patient not taking:  Reported on 04/27/2017 08/30/15   Kerri PerchesSimpson, Margaret E, MD    Family History Family History  Problem Relation Age of Onset  . Asthma Mother   . Hypertension Mother   . Bronchiolitis Mother   . Hypertension Father   . Hyperlipidemia Father        recurrent rectum polyps   . Diabetes Sister   . Lupus Sister   . Depression Sister     Social History Social History  Substance Use Topics  . Smoking status: Former Smoker    Packs/day: 0.25    Years: 10.00    Types: Cigarettes    Quit date: 10/13/2008  . Smokeless tobacco: Never Used  . Alcohol use No     Allergies   Compazine [prochlorperazine]   Review of Systems Review of Systems  Constitutional: Negative for fever.  HENT: Negative for congestion.   Eyes: Negative for redness.  Respiratory: Negative for shortness of breath.   Cardiovascular: Negative for chest pain.  Gastrointestinal: Negative for abdominal pain.  Genitourinary: Positive for frequency. Negative for dysuria, hematuria and vaginal bleeding.  Musculoskeletal: Positive for back pain.  Skin: Negative for rash.  Neurological: Negative for headaches.  Hematological: Does not bruise/bleed easily.  Psychiatric/Behavioral: Negative for confusion.     Physical Exam Updated Vital Signs BP  137/87 (BP Location: Right Arm)   Pulse 93   Temp 98.2 F (36.8 C) (Oral)   Resp 16   Ht 1.676 m (5\' 6" )   Wt 93 kg (205 lb)   LMP 05/17/2012   SpO2 100%   BMI 33.09 kg/m   Physical Exam  Constitutional: She is oriented to person, place, and time. She appears well-developed and well-nourished. No distress.  HENT:  Head: Normocephalic and atraumatic.  Mouth/Throat: Oropharynx is clear and moist.  Eyes: Pupils are equal, round, and reactive to light. Conjunctivae and EOM are normal.  Neck: Normal range of motion. Neck supple.  Cardiovascular: Normal rate, regular rhythm and normal heart sounds.   Pulmonary/Chest: Effort normal and breath sounds normal.  Abdominal: Soft. Bowel sounds are normal. There is no tenderness.  Musculoskeletal: Normal range of motion. She exhibits no tenderness.  Neurological: She is alert and oriented to person, place, and time. No cranial nerve deficit or sensory deficit. She exhibits normal muscle tone.  Skin: Skin is warm.  Nursing note and vitals reviewed.    ED Treatments / Results  Labs (all labs ordered are listed, but only abnormal results are displayed) Labs Reviewed  CBC WITH DIFFERENTIAL/PLATELET - Abnormal; Notable for the following:       Result Value   HCT 35.6 (*)    Platelets 406 (*)    All other components within normal limits  BASIC METABOLIC PANEL - Abnormal; Notable for the following:    Potassium 3.2 (*)    Chloride 100 (*)    Creatinine, Ser 1.01 (*)    All other components within normal limits  URINALYSIS, ROUTINE W REFLEX MICROSCOPIC    EKG  EKG Interpretation None       Radiology Ct Renal Stone Study  Result Date: 04/27/2017 CLINICAL DATA:  Left flank pain for the past 4 days. EXAM: CT ABDOMEN AND PELVIS WITHOUT CONTRAST TECHNIQUE: Multidetector CT imaging of the abdomen and pelvis was performed following the standard protocol without IV contrast. COMPARISON:  Pelvis CT dated 01/03/2016 and abdomen and pelvis CT  dated 06/08/2013. FINDINGS: Lower chest: A tiny left lower lobe subpleural nodule on image number 4 series 4 is  unchanged since 06/08/2013, compatible with a benign process. Hepatobiliary: Cholecystectomy clips.  Stable small liver cysts. Pancreas: Unremarkable. No pancreatic ductal dilatation or surrounding inflammatory changes. Spleen: Normal in size without focal abnormality. Adrenals/Urinary Tract: Adrenal glands are unremarkable. Kidneys are normal, without renal calculi, focal lesion, or hydronephrosis. Bladder is unremarkable. Small bilateral pelvic phleboliths. Stomach/Bowel: Scattered colonic diverticula without evidence of diverticulitis. Unremarkable stomach, small bowel and appendix. Vascular/Lymphatic: No significant vascular findings are present. No enlarged abdominal or pelvic lymph nodes. Reproductive: Uterus and bilateral adnexa are unremarkable. Other: No abdominal wall hernia or abnormality. No abdominopelvic ascites. Musculoskeletal: Previously described pelvic fixation hardware. Old left L3 and L4 transverse process fractures with nonunion. Mild lumbar and lower thoracic spine degenerative changes. IMPRESSION: 1. No acute abnormality. 2. Mild colonic diverticulosis. Electronically Signed   By: Beckie Salts M.D.   On: 04/27/2017 14:00    Procedures Procedures (including critical care time)  Medications Ordered in ED Medications - No data to display   Initial Impression / Assessment and Plan / ED Course  I have reviewed the triage vital signs and the nursing notes.  Pertinent labs & imaging results that were available during my care of the patient were reviewed by me and considered in my medical decision making (see chart for details).     Workup without evidence of urinary tract infection. No evidence of a kidney stone. Symptoms may very well be musculoskeletal. Since in further questioning patient states certain movements do increase the pain. Will treat as such it'll work note  have her follow-up with her regular doctor.  Final Clinical Impressions(s) / ED Diagnoses   Final diagnoses:  Left flank pain  Acute left-sided low back pain without sciatica    New Prescriptions New Prescriptions   CYCLOBENZAPRINE (FLEXERIL) 10 MG TABLET    Take 1 tablet (10 mg total) by mouth 2 (two) times daily as needed for muscle spasms.   NAPROXEN (NAPROSYN) 500 MG TABLET    Take 1 tablet (500 mg total) by mouth 2 (two) times daily.     Vanetta Mulders, MD 04/27/17 [phone]

## 2017-05-07 IMAGING — CR DG ANKLE COMPLETE 3+V*R*
3 series · 3 of 3 positions shown · non-contrast
Comparison: None.

CLINICAL DATA: Pt fell from horse today. Right ankle pain. Could
not move right leg from other injuries.

EXAM:
RIGHT ANKLE - COMPLETE 3+ VIEW

[ankle ap]
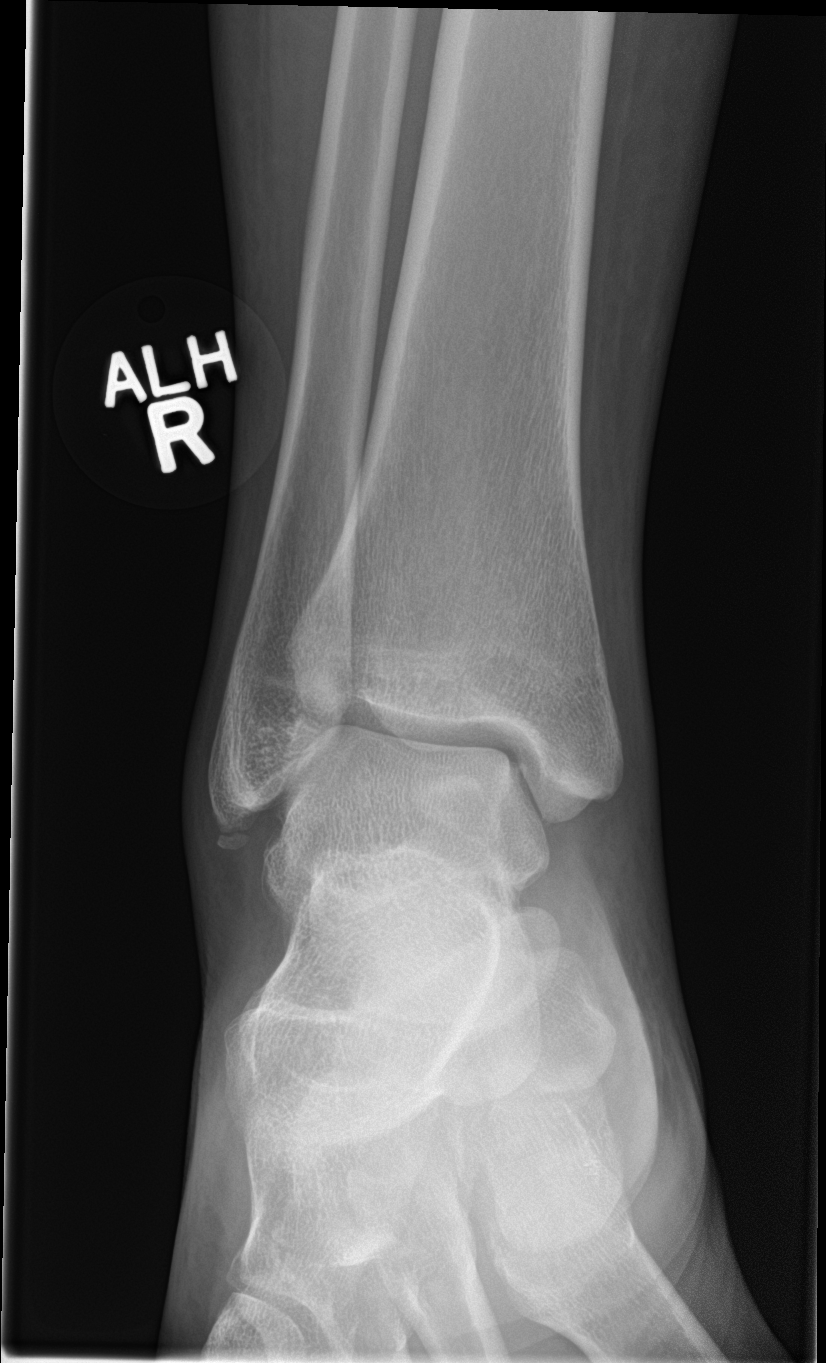

[ankle obl]
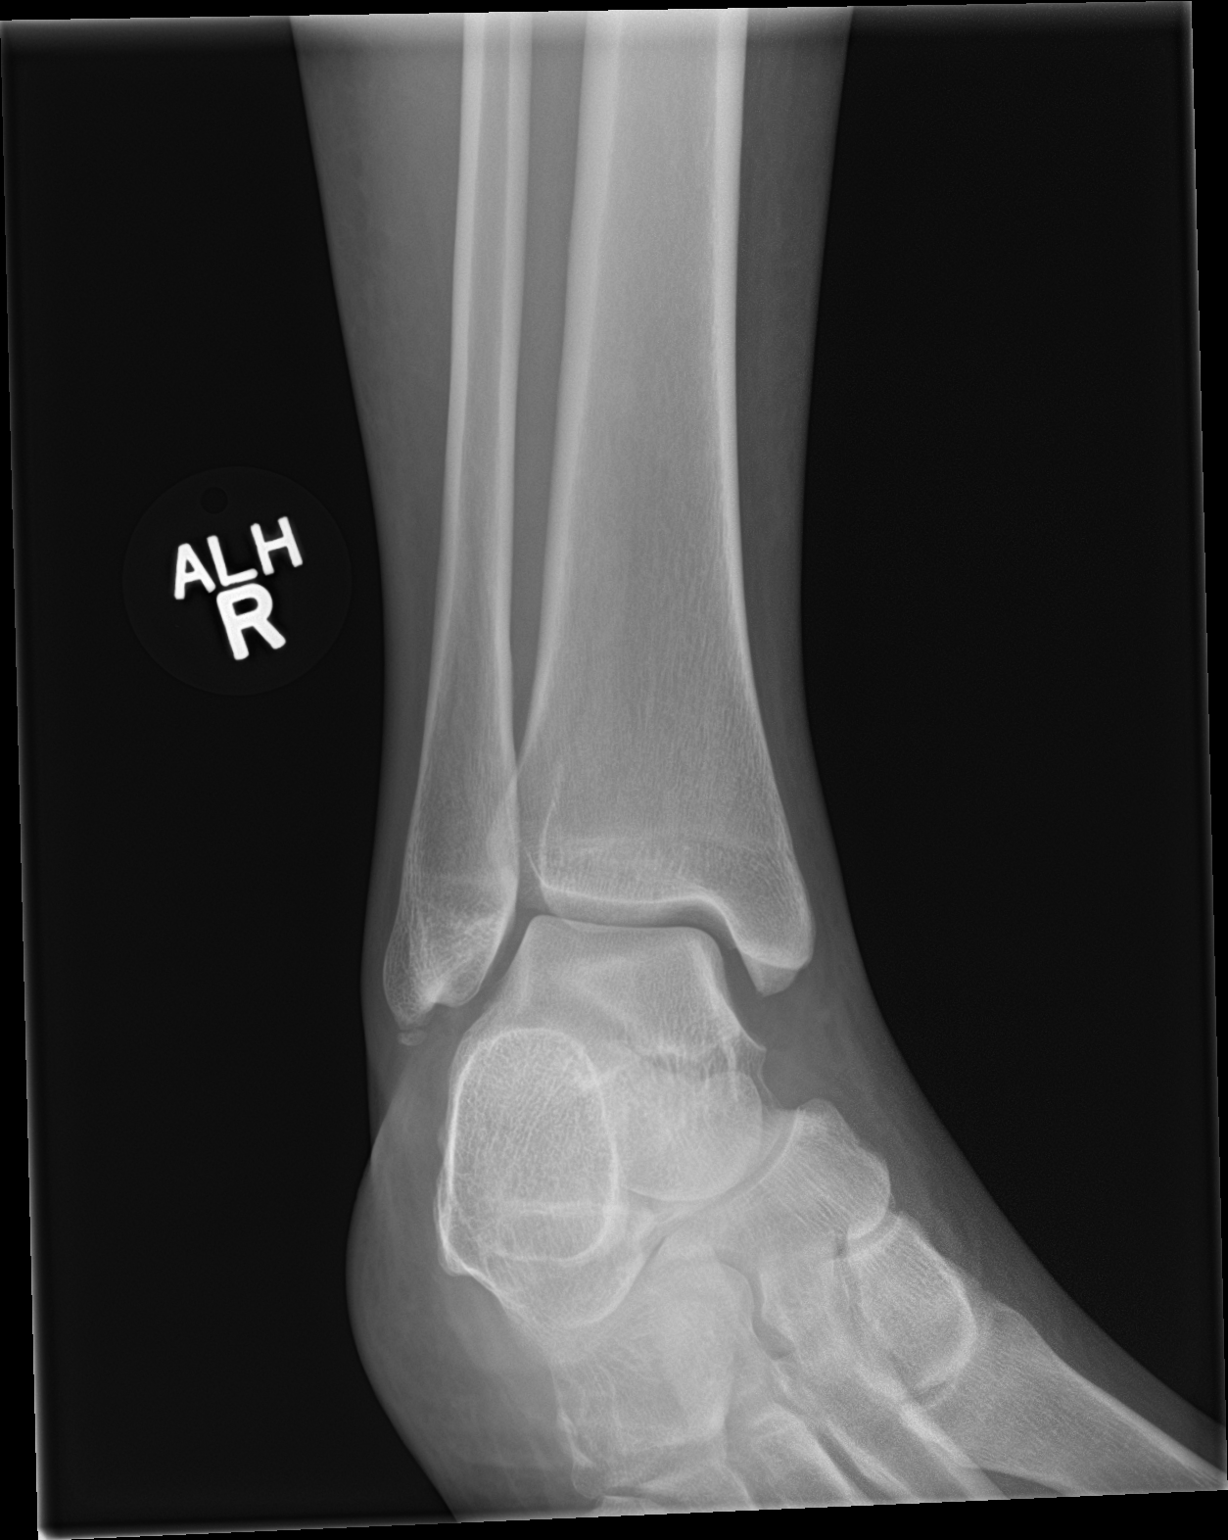

[ankle lat]
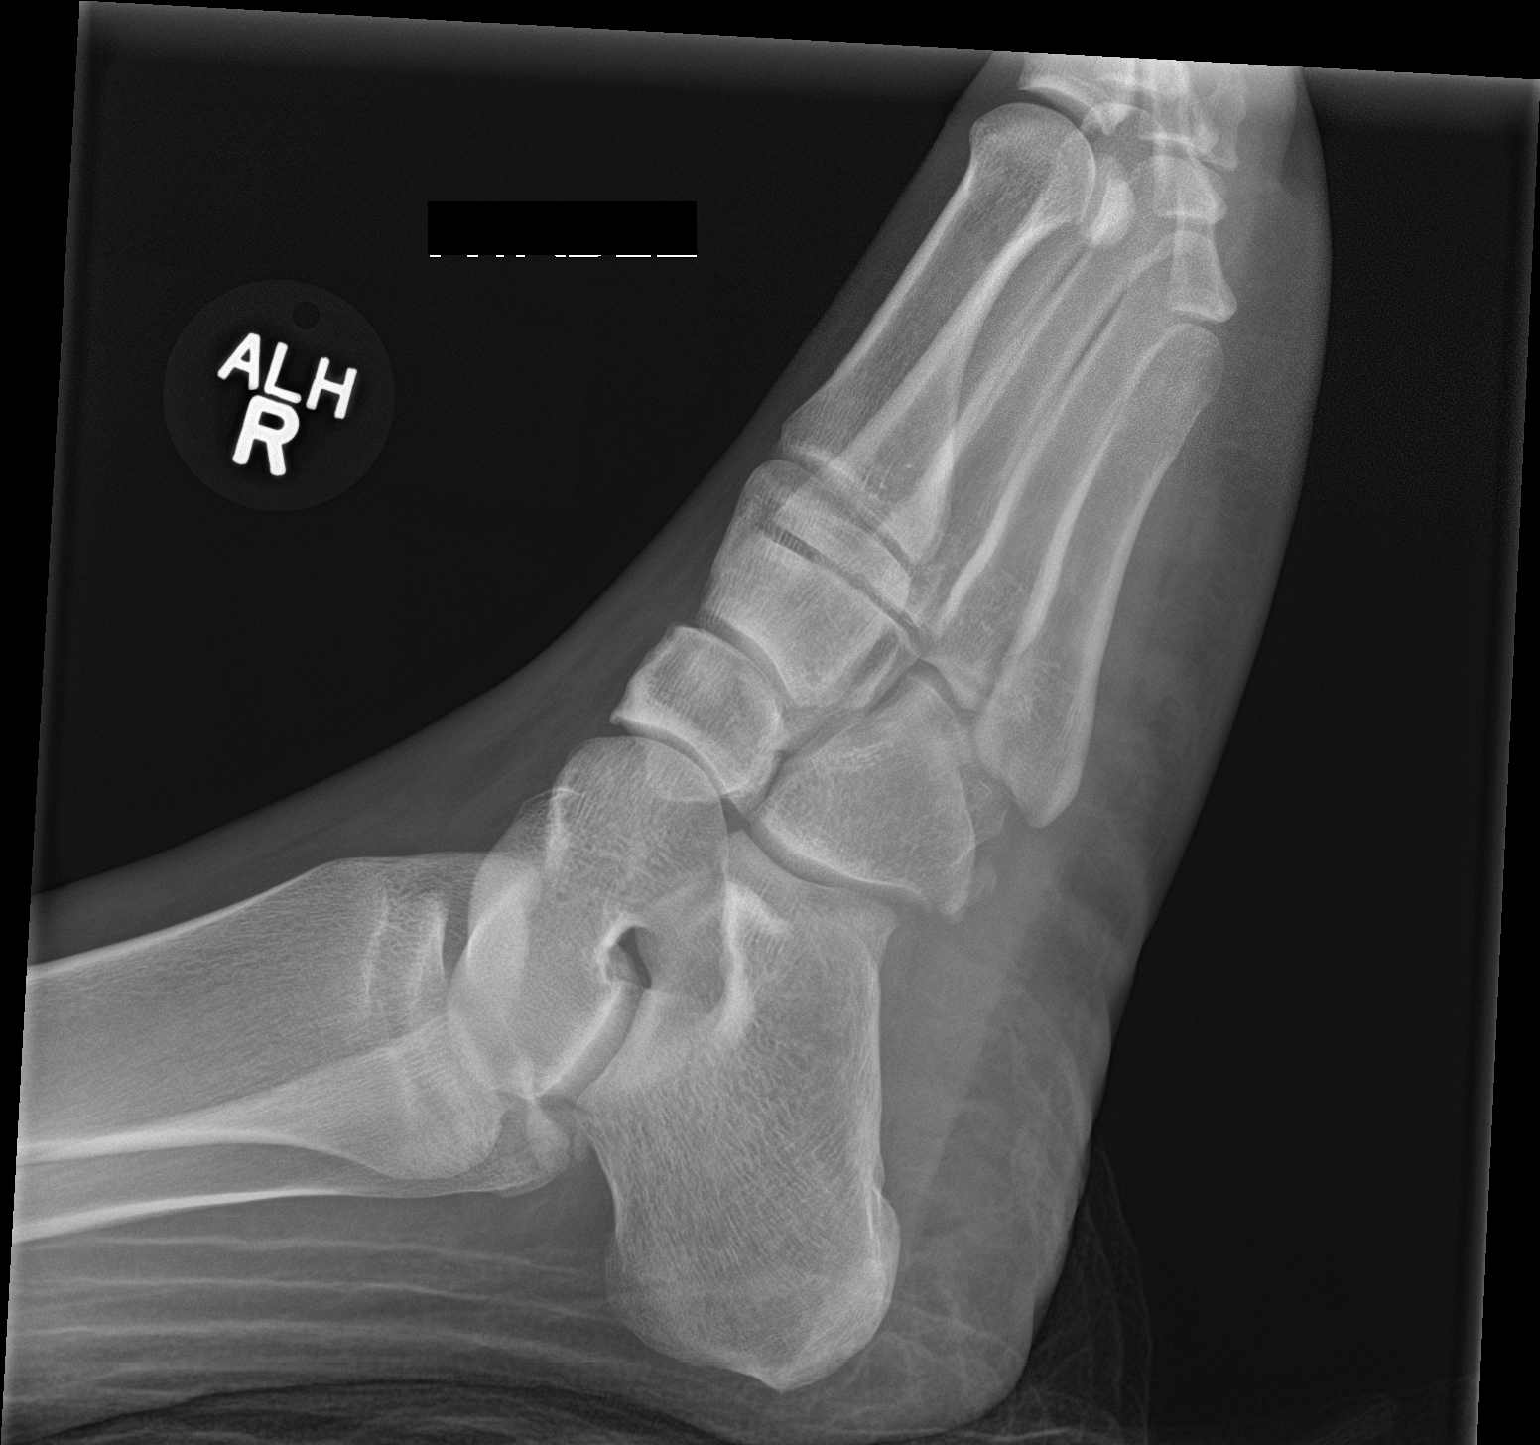

[3 of 3 positions shown; findings below may reference images not displayed]

FINDINGS: Lateral view suboptimal secondary to cross table technique. There is
an os ossific fragment distal to the lateral malleolus which is felt
to be corticated, likely related to remote trauma. Otherwise, no
fracture is seen. There is apparent irregularity about the lateral
midfoot on the suboptimal lateral view. This could be projectional.

The posterior aspect of the fibula also appears irregular on the
attempted lateral view, indeterminate.
IMPRESSION: Suboptimal lateral view secondary to patient positioning and
cross-table technique. Given this factor, no definite acute osseous
finding. Probable remote lateral malleolar fracture.

Apparent osseous irregularity about the lateral midfoot and
posterior fibula favored to be projectional. Depending on clinical
symptoms, consider repeat radiographs when patient is able to be
placed in a standard lateral position.

## 2017-05-07 IMAGING — CT CT L SPINE W/O CM
3 of 6 series · 15 of 33 positions shown, 18 images · IV contrast (Omni 300)
Comparison: None.

CLINICAL DATA: Injury sustained while horseback riding today. A
horse fell on top of the patient. Back pain. Initial encounter.

EXAM:
CT LUMBAR SPINE WITHOUT CONTRAST
TECHNIQUE: Multidetector CT imaging of the lumbar spine was performed without
intravenous contrast administration. Multiplanar CT image
reconstructions were also generated.

[Series 10: thins · axial · 0.43mm/px · z∈[-596,-382]mm · 7 of 572 slices shown, 9 images]
[im 72/572  soft-tissue]
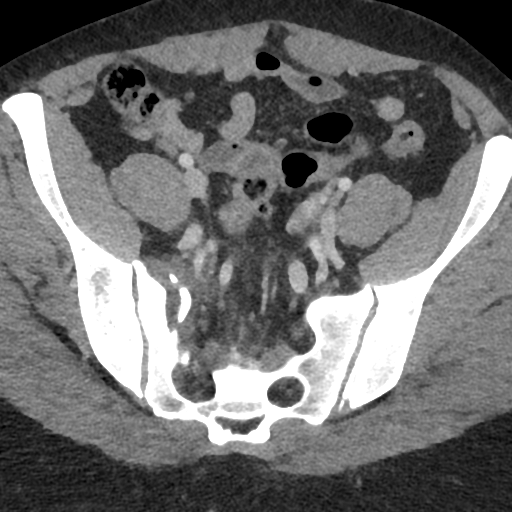
[im 72/572  bone]
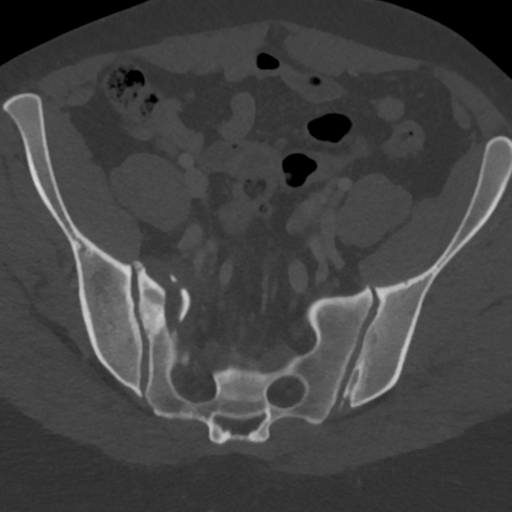
[im 143/572  bone]
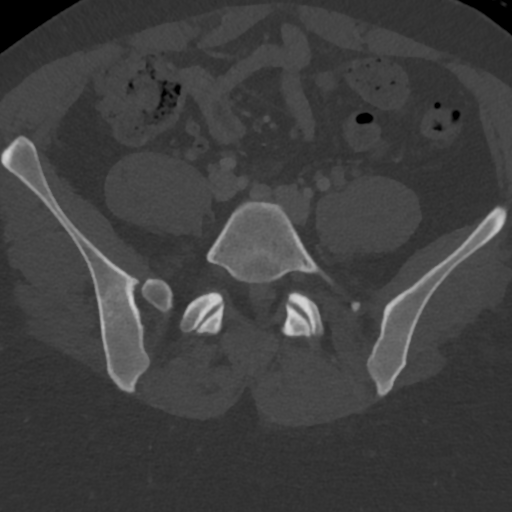
[im 215/572  bone]
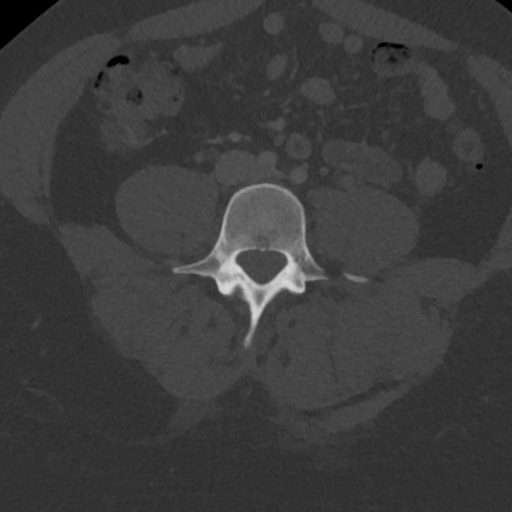
[im 286/572  bone]
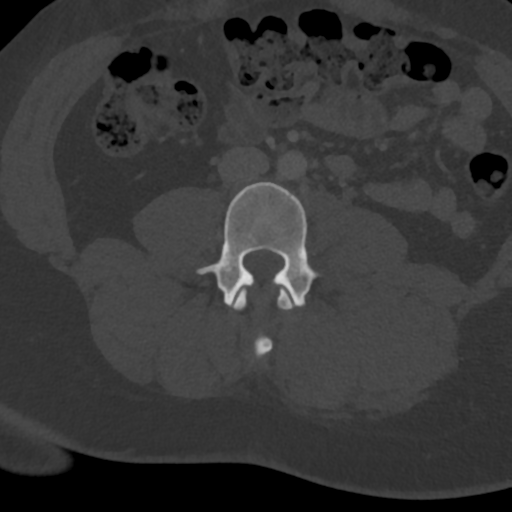
[im 357/572  soft-tissue]
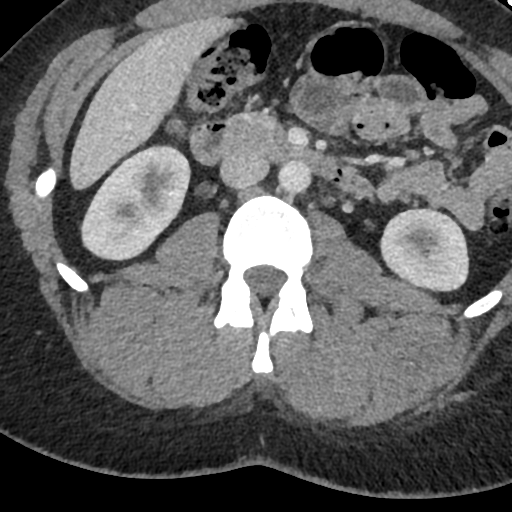
[im 357/572  bone]
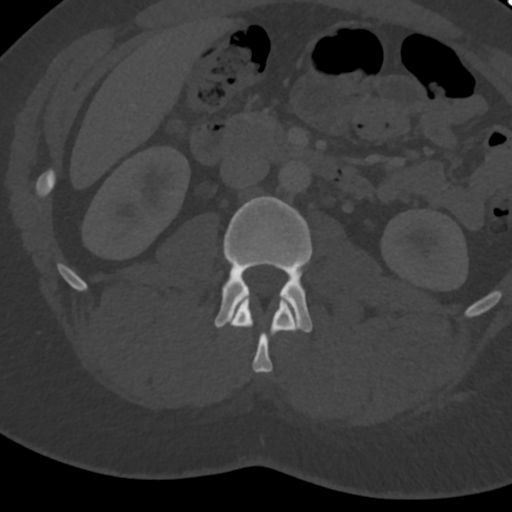
[im 429/572  bone]
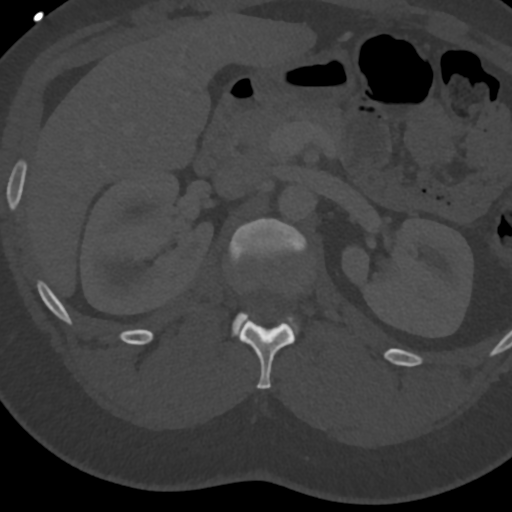
[im 500/572  bone]
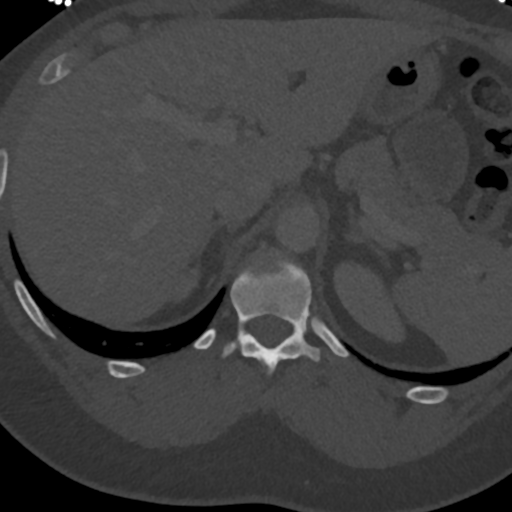

[Series 602: sag lspine bone · sagittal · 0.56mm/px · 5 of 57 slices shown, 6 images]
[im 19/57  bone]
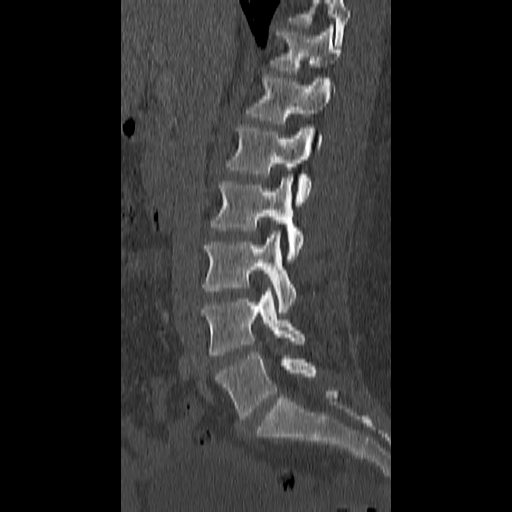
[im 24/57  bone]
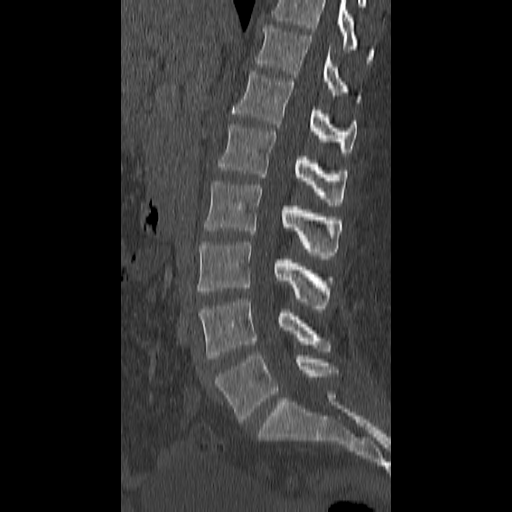
[im 29/57  soft-tissue]
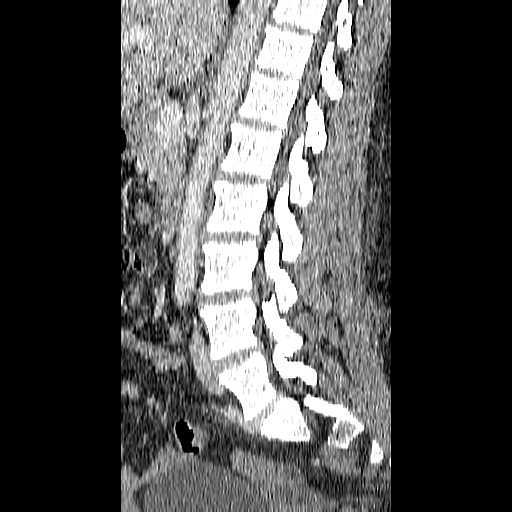
[im 29/57  bone]
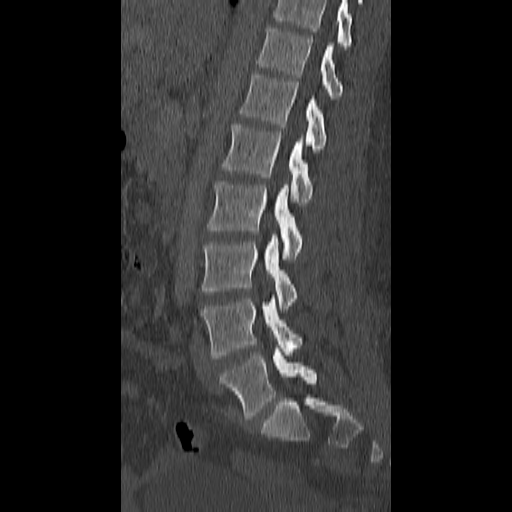
[im 33/57  bone]
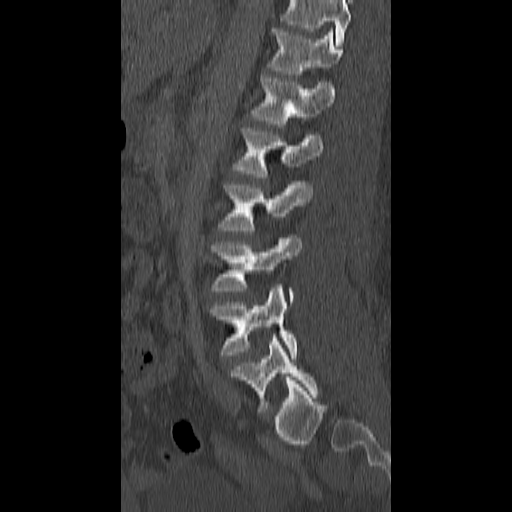
[im 38/57  bone]
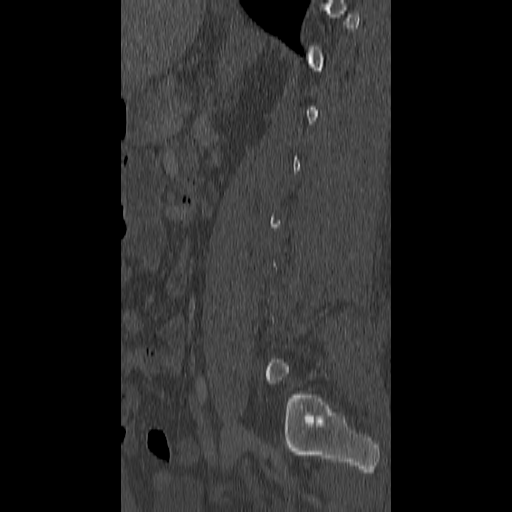

[Series 605: cor lspine bone · coronal · 0.56mm/px · 3 of 68 slices shown]
[im 14/68  bone]
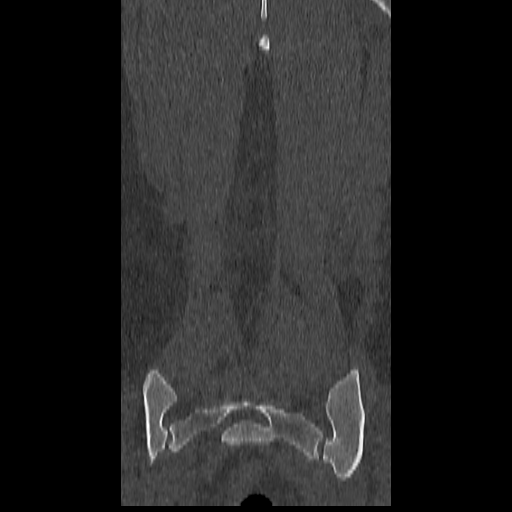
[im 27/68  bone]
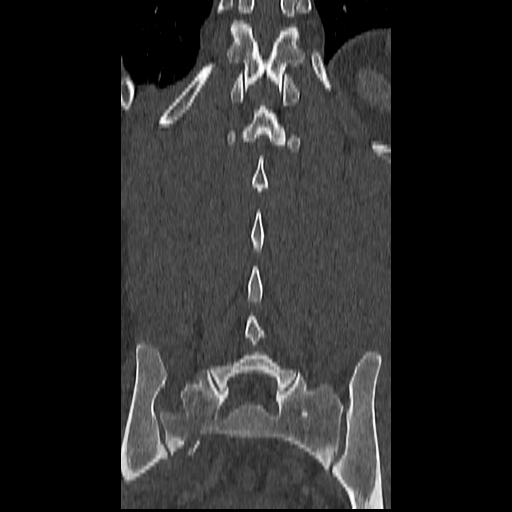
[im 41/68  bone]
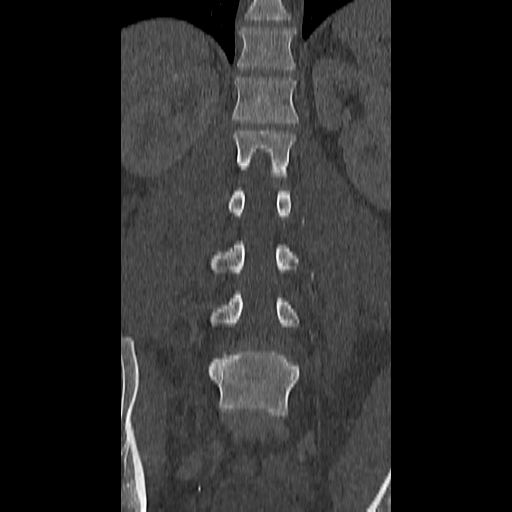

[15 of 33 positions shown; findings below may reference images not displayed]

FINDINGS: The patient has transverse process fractures on the left at L2, L3
and L4. Vertebral body height and alignment are normal. The facet
joints are intact and unremarkable in appearance. Right sacral
fracture is noted. Shallow disc bulge is seen at L4-5 but the
central canal and foramina appear open at all imaged levels.
IMPRESSION: Left transverse process fractures L2-L4 and right sacral fracture.

## 2017-05-11 IMAGING — DX DG ABD PORTABLE 1V
1 series · 1 of 1 positions shown · non-contrast
Comparison: 07/31/2015 pelvic images

CLINICAL DATA: Abdominal distention

EXAM:
PORTABLE ABDOMEN - 1 VIEW

[abdomen supine]
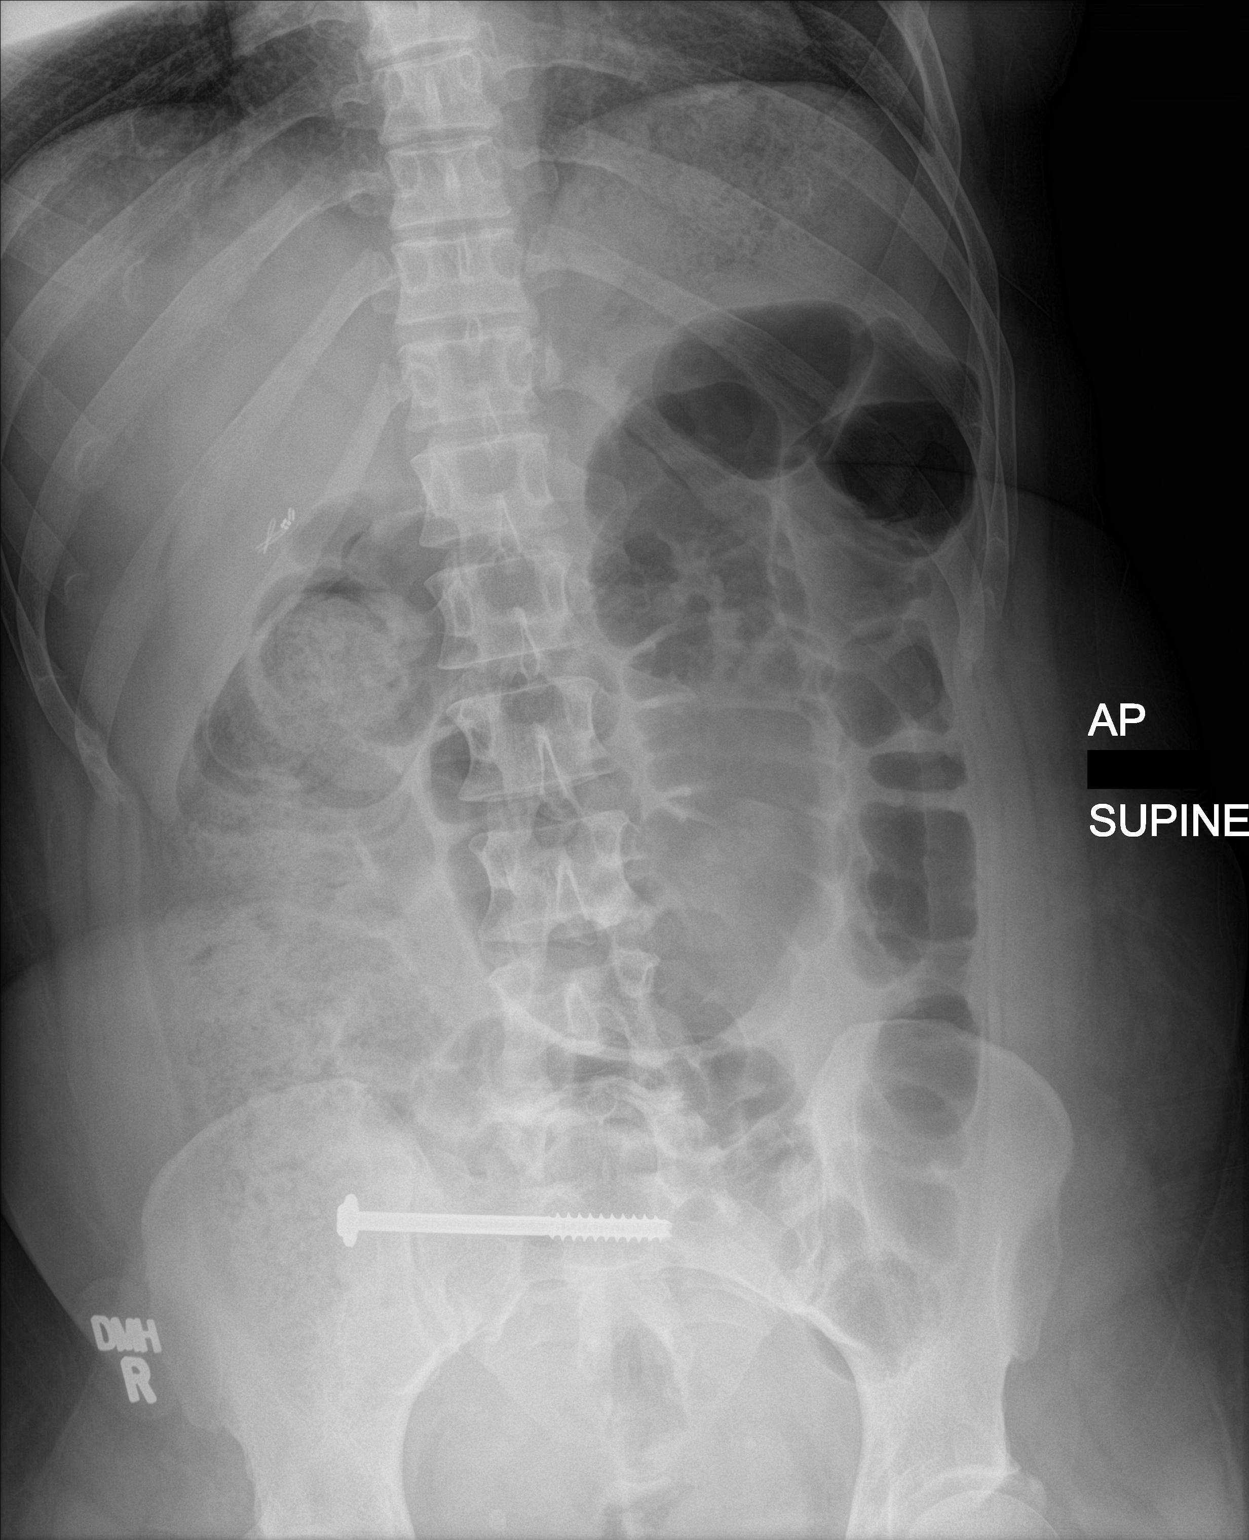

[1 of 1 positions shown; findings below may reference images not displayed]

FINDINGS: Gaseous distention of the colon diffusely. Large stool burden in the
right colon. Prior cholecystectomy. No organomegaly or free air. No
suspicious calcification. Screw across the right SI joint, stable.
IMPRESSION: Mild gaseous distention of the colon, likely mild ileus. Large stool
burden in the right colon.

## 2017-05-14 DIAGNOSIS — R1011 Right upper quadrant pain: Secondary | ICD-10-CM | POA: Diagnosis not present

## 2017-05-14 DIAGNOSIS — M1990 Unspecified osteoarthritis, unspecified site: Secondary | ICD-10-CM | POA: Diagnosis not present

## 2017-05-14 DIAGNOSIS — R197 Diarrhea, unspecified: Secondary | ICD-10-CM | POA: Diagnosis not present

## 2017-05-14 DIAGNOSIS — K5902 Outlet dysfunction constipation: Secondary | ICD-10-CM | POA: Diagnosis not present

## 2017-05-14 DIAGNOSIS — K219 Gastro-esophageal reflux disease without esophagitis: Secondary | ICD-10-CM | POA: Diagnosis not present

## 2017-05-20 DIAGNOSIS — K5900 Constipation, unspecified: Secondary | ICD-10-CM | POA: Diagnosis not present

## 2017-05-22 DIAGNOSIS — J301 Allergic rhinitis due to pollen: Secondary | ICD-10-CM | POA: Diagnosis not present

## 2017-05-22 DIAGNOSIS — J45901 Unspecified asthma with (acute) exacerbation: Secondary | ICD-10-CM | POA: Diagnosis not present

## 2017-07-21 DIAGNOSIS — J019 Acute sinusitis, unspecified: Secondary | ICD-10-CM | POA: Diagnosis not present

## 2017-07-21 DIAGNOSIS — J301 Allergic rhinitis due to pollen: Secondary | ICD-10-CM | POA: Diagnosis not present

## 2017-07-21 DIAGNOSIS — J45901 Unspecified asthma with (acute) exacerbation: Secondary | ICD-10-CM | POA: Diagnosis not present

## 2017-08-03 ENCOUNTER — Other Ambulatory Visit (HOSPITAL_COMMUNITY): Payer: Self-pay | Admitting: Pulmonary Disease

## 2017-08-03 ENCOUNTER — Ambulatory Visit (HOSPITAL_COMMUNITY)
Admission: RE | Admit: 2017-08-03 | Discharge: 2017-08-03 | Disposition: A | Discharge status: Home / Self Care | Payer: 59 | Source: Ambulatory Visit | Attending: Pulmonary Disease | Admitting: Pulmonary Disease

## 2017-08-03 DIAGNOSIS — R0602 Shortness of breath: Secondary | ICD-10-CM | POA: Diagnosis present

## 2017-08-03 DIAGNOSIS — R05 Cough: Secondary | ICD-10-CM | POA: Diagnosis not present

## 2017-08-03 DIAGNOSIS — J45901 Unspecified asthma with (acute) exacerbation: Secondary | ICD-10-CM | POA: Diagnosis not present

## 2017-08-03 DIAGNOSIS — J301 Allergic rhinitis due to pollen: Secondary | ICD-10-CM | POA: Diagnosis not present

## 2017-08-03 DIAGNOSIS — M1990 Unspecified osteoarthritis, unspecified site: Secondary | ICD-10-CM | POA: Diagnosis not present

## 2017-08-06 DIAGNOSIS — J309 Allergic rhinitis, unspecified: Secondary | ICD-10-CM | POA: Diagnosis not present

## 2017-08-06 DIAGNOSIS — J45901 Unspecified asthma with (acute) exacerbation: Secondary | ICD-10-CM | POA: Diagnosis not present

## 2017-08-10 DIAGNOSIS — J45901 Unspecified asthma with (acute) exacerbation: Secondary | ICD-10-CM | POA: Diagnosis not present

## 2017-08-10 DIAGNOSIS — J309 Allergic rhinitis, unspecified: Secondary | ICD-10-CM | POA: Diagnosis not present

## 2017-08-10 DIAGNOSIS — J019 Acute sinusitis, unspecified: Secondary | ICD-10-CM | POA: Diagnosis not present

## 2017-08-18 DIAGNOSIS — J45901 Unspecified asthma with (acute) exacerbation: Secondary | ICD-10-CM | POA: Diagnosis not present

## 2017-08-18 DIAGNOSIS — J309 Allergic rhinitis, unspecified: Secondary | ICD-10-CM | POA: Diagnosis not present

## 2017-08-18 DIAGNOSIS — J019 Acute sinusitis, unspecified: Secondary | ICD-10-CM | POA: Diagnosis not present

## 2017-08-19 DIAGNOSIS — K580 Irritable bowel syndrome with diarrhea: Secondary | ICD-10-CM | POA: Diagnosis not present

## 2017-08-27 DIAGNOSIS — J45901 Unspecified asthma with (acute) exacerbation: Secondary | ICD-10-CM | POA: Diagnosis not present

## 2017-08-27 DIAGNOSIS — J309 Allergic rhinitis, unspecified: Secondary | ICD-10-CM | POA: Diagnosis not present

## 2017-09-02 DIAGNOSIS — J309 Allergic rhinitis, unspecified: Secondary | ICD-10-CM | POA: Diagnosis not present

## 2017-09-02 DIAGNOSIS — J45901 Unspecified asthma with (acute) exacerbation: Secondary | ICD-10-CM | POA: Diagnosis not present

## 2017-09-02 DIAGNOSIS — M2550 Pain in unspecified joint: Secondary | ICD-10-CM | POA: Diagnosis not present

## 2017-09-24 ENCOUNTER — Encounter: Payer: Self-pay | Admitting: Allergy & Immunology

## 2017-09-24 ENCOUNTER — Ambulatory Visit: Payer: 59 | Admitting: Allergy & Immunology

## 2017-09-24 VITALS — BP 130/80 | HR 102 | Resp 18 | Ht 65.0 in | Wt 209.0 lb

## 2017-09-24 DIAGNOSIS — J302 Other seasonal allergic rhinitis: Secondary | ICD-10-CM

## 2017-09-24 DIAGNOSIS — J3089 Other allergic rhinitis: Secondary | ICD-10-CM

## 2017-09-24 DIAGNOSIS — J4540 Moderate persistent asthma, uncomplicated: Secondary | ICD-10-CM | POA: Diagnosis not present

## 2017-09-24 DIAGNOSIS — K219 Gastro-esophageal reflux disease without esophagitis: Secondary | ICD-10-CM

## 2017-09-24 MED ORDER — AZELASTINE-FLUTICASONE 137-50 MCG/ACT NA SUSP: 2.0000 | NASAL | 5 refills | Status: DC

## 2017-09-24 MED ORDER — MOMETASONE FURO-FORMOTEROL FUM 200-5 MCG/ACT IN AERO: 2.0000 | INHALATION_SPRAY | Freq: Two times a day (BID) | RESPIRATORY_TRACT | 3 refills | Status: DC

## 2017-09-24 NOTE — Patient Instructions (Addendum)
1. Moderate persistent asthma, uncomplicated - Lung testing actually looked surprisingly good.  - However, I think you need to take a daily controller medication: Dulera 200/5 two puffs twice daily. - Daily controller medication(s): Singulair 10mg  daily and Dulera 200/785mcg two puffs twice daily with spacer - Prior to physical activity: ProAir 2 puffs 10-15 minutes before physical activity. - Rescue medications: ProAir 4 puffs every 4-6 hours as needed - Asthma control goals:  * Full participation in all desired activities (may need albuterol before activity) * Albuterol use two time or less a week on average (not counting use with activity) * Cough interfering with sleep two time or less a month * Oral steroids no more than once a year * No hospitalizations  2. Chronic rhinitis - Testing today showed: weeds, grasses, indoor molds, outdoor molds, dust mites, cat and dog - Avoidance measures provided. - Stop taking: fluticasone (Flonase) - Continue with: Xyzal (levocetirizine) 5mg  tablet once daily - Start taking: Singulair (montelukast) 10mg  daily and Dymista (fluticasone/azelastine) two sprays per nostril 1-2 times daily as needed - You can use an extra dose of the antihistamine, if needed, for breakthrough symptoms.  - Consider nasal saline rinses 1-2 times daily to remove allergens from the nasal cavities as well as help with mucous clearance (this is especially helpful to do before the nasal sprays are given) - Consider allergy shots as a means of long-term control. - Allergy shots "re-train" and "reset" the immune system to ignore environmental allergens and decrease the resulting immune response to those allergens (sneezing, itchy watery eyes, runny nose, nasal congestion, etc).    - Allergy shots improve symptoms in 75-85% of patients.  - We can discuss more at the next appointment if the medications are not working for you. . 3. Reflux - Continue with Prilosec daily.  4. Return in  about 2 months (around 11/25/2017).   Please inform us of any Emergency Department visits, hospitalizations, or changes in symptoms. Call us before going to the ED for breathing or allergy symptoms since we might be able to fit you in for a sick visit. Feel free to contact us anytime with any questions, problems, or concerns.  It was a pleasure to meet you today! Enjoy the holiday season!  Websites that have reliable patient information: 1. American Academy of Asthma, Allergy, and Immunology: www.aaaai.org 2. Food Allergy Research and Education (FARE): foodallergy.org 3. Mothers of Asthmatics: http://www.asthmacommunitynetwork.org 4. American College of Allergy, Asthma, and Immunology: www.acaai.org   Reducing Pollen Exposure  The American Academy of Allergy, Asthma and Immunology suggests the following steps to reduce your exposure to pollen during allergy seasons.    1. Do not hang sheets or clothing out to dry; pollen may collect on these items. 2. Do not mow lawns or spend time around freshly cut grass; mowing stirs up pollen. 3. Keep windows closed at night.  Keep car windows closed while driving. 4. Minimize morning activities outdoors, a time when pollen counts are usually at their highest. 5. Stay indoors as much as possible when pollen counts or humidity is high and on windy days when pollen tends to remain in the air longer. 6. Use air conditioning when possible.  Many air conditioners have filters that trap the pollen spores. 7. Use a HEPA room air filter to remove pollen form the indoor air you breathe.  Control of Mold Allergen   Mold and fungi can grow on a variety of surfaces provided certain temperature and moisture conditions exist.  Outdoor molds grow on plants, decaying vegetation and soil.  The major outdoor mold, Alternaria and Cladosporium, are found in very high numbers during hot and dry conditions.  Generally, a late Summer - Fall peak is seen for common outdoor  fungal spores.  Rain will temporarily lower outdoor mold spore count, but counts rise rapidly when the rainy period ends.  The most important indoor molds are Aspergillus and Penicillium.  Dark, humid and poorly ventilated basements are ideal sites for mold growth.  The next most common sites of mold growth are the bathroom and the kitchen.  Outdoor (Seasonal) Mold Control  Positive outdoor molds via skin testing: Bipolaris (Helminthsporium), Drechslera (Curvalaria) and Mucor  1. Use air conditioning and keep windows closed 2. Avoid exposure to decaying vegetation. 3. Avoid leaf raking. 4. Avoid grain handling. 5. Consider wearing a face mask if working in moldy areas.  6.   Indoor (Perennial) Mold Control   Positive indoor molds via skin testing: Aspergillus and Penicillium  1. Maintain humidity below 50%. 2. Clean washable surfaces with 5% bleach solution. 3. Remove sources e.g. contaminated carpets.    Control of House Dust Mite Allergen    House dust mites play a major role in allergic asthma and rhinitis.  They occur in environments with high humidity wherever human skin, the food for dust mites is found. High levels have been detected in dust obtained from mattresses, pillows, carpets, upholstered furniture, bed covers, clothes and soft toys.  The principal allergen of the house dust mite is found in its feces.  A gram of dust may contain 1,000 mites and 250,000 fecal particles.  Mite antigen is easily measured in the air during house cleaning activities.    1. Encase mattresses, including the box spring, and pillow, in an air tight cover.  Seal the zipper end of the encased mattresses with wide adhesive tape. 2. Wash the bedding in water of 130 degrees Farenheit weekly.  Avoid cotton comforters/quilts and flannel bedding: the most ideal bed covering is the dacron comforter. 3. Remove all upholstered furniture from the bedroom. 4. Remove carpets, carpet padding, rugs, and  non-washable window drapes from the bedroom.  Wash drapes weekly or use plastic window coverings. 5. Remove all non-washable stuffed toys from the bedroom.  Wash stuffed toys weekly. 6. Have the room cleaned frequently with a vacuum cleaner and a damp dust-mop.  The patient should not be in a room which is being cleaned and should wait 1 hour after cleaning before going into the room. 7. Close and seal all heating outlets in the bedroom.  Otherwise, the room will become filled with dust-laden air.  An electric heater can be used to heat the room. 8. Reduce indoor humidity to less than 50%.  Do not use a humidifier.  Control of Dog or Cat Allergen  Avoidance is the best way to manage a dog or cat allergy. If you have a dog or cat and are allergic to dog or cats, consider removing the dog or cat from the home. If you have a dog or cat but don't want to find it a new home, or if your family wants a pet even though someone in the household is allergic, here are some strategies that may help keep symptoms at bay:  1. Keep the pet out of your bedroom and restrict it to only a few rooms. Be advised that keeping the dog or cat in only one room will not limit the allergens to that  room. 2. Don't pet, hug or kiss the dog or cat; if you do, wash your hands with soap and water. 3. High-efficiency particulate air (HEPA) cleaners run continuously in a bedroom or living room can reduce allergen levels over time. 4. Regular use of a high-efficiency vacuum cleaner or a central vacuum can reduce allergen levels. 5. Giving your dog or cat a bath at least once a week can reduce airborne allergen.  Control of House Dust Mite Allergen    House dust mites play a major role in allergic asthma and rhinitis.  They occur in environments with high humidity wherever human skin, the food for dust mites is found. High levels have been detected in dust obtained from mattresses, pillows, carpets, upholstered furniture, bed  covers, clothes and soft toys.  The principal allergen of the house dust mite is found in its feces.  A gram of dust may contain 1,000 mites and 250,000 fecal particles.  Mite antigen is easily measured in the air during house cleaning activities.    9. Encase mattresses, including the box spring, and pillow, in an air tight cover.  Seal the zipper end of the encased mattresses with wide adhesive tape. 10. Wash the bedding in water of 130 degrees Farenheit weekly.  Avoid cotton comforters/quilts and flannel bedding: the most ideal bed covering is the dacron comforter. 11. Remove all upholstered furniture from the bedroom. 12. Remove carpets, carpet padding, rugs, and non-washable window drapes from the bedroom.  Wash drapes weekly or use plastic window coverings. 13. Remove all non-washable stuffed toys from the bedroom.  Wash stuffed toys weekly. 14. Have the room cleaned frequently with a vacuum cleaner and a damp dust-mop.  The patient should not be in a room which is being cleaned and should wait 1 hour after cleaning before going into the room. 15. Close and seal all heating outlets in the bedroom.  Otherwise, the room will become filled with dust-laden air.  An electric heater can be used to heat the room. 16. Reduce indoor humidity to less than 50%.  Do not use a humidifier.

## 2017-09-24 NOTE — Progress Notes (Signed)
NEW PATIENT  Date of Service/Encounter:  09/24/17  Referring provider: Kari BaarsHawkins, Edward, MD   Assessment:   Moderate persistent asthma, uncomplicated  Seasonal and perennial allergic rhinitis (weeds, grasses, indoor molds, outdoor molds, dust mites, cat and dog)   GERD - on a PPI   Asthma Reportables:  Severity: moderate persistent  Risk: high Control: not well controlled  Plan/Recommendations:   1. Moderate persistent asthma - possibly complicated by a VCD component - Lung testing actually looked surprisingly good.  - However, I think you need to take a daily controller medication: Dulera 200/5 two puffs twice daily. - Daily controller medication(s): Singulair 10mg  daily and Dulera 200/425mcg two puffs twice daily with spacer - Prior to physical activity: ProAir 2 puffs 10-15 minutes before physical activity. - Rescue medications: ProAir 4 puffs every 4-6 hours as needed - Asthma control goals:  * Full participation in all desired activities (may need albuterol before activity) * Albuterol use two time or less a week on average (not counting use with activity) * Cough interfering with sleep two time or less a month * Oral steroids no more than once a year * No hospitalizations  2. Chronic rhinitis - Testing today showed: weeds, grasses, indoor molds, outdoor molds, dust mites, cat and dog - Avoidance measures provided. - Stop taking: fluticasone (Flonase) - Continue with: Xyzal (levocetirizine) 5mg  tablet once daily - Start taking: Singulair (montelukast) 10mg  daily and Dymista (fluticasone/azelastine) two sprays per nostril 1-2 times daily as needed - You can use an extra dose of the antihistamine, if needed, for breakthrough symptoms.  - Consider nasal saline rinses 1-2 times daily to remove allergens from the nasal cavities as well as help with mucous clearance (this is especially helpful to do before the nasal sprays are given) - Consider allergy shots as a means of  long-term control. - Allergy shots "re-train" and "reset" the immune system to ignore environmental allergens and decrease the resulting immune response to those allergens (sneezing, itchy watery eyes, runny nose, nasal congestion, etc).    - Allergy shots improve symptoms in 75-85% of patients.  - We can discuss more at the next appointment if the medications are not working for you. . 3. Reflux - Continue with Prilosec daily.  4. Return in about 2 months (around 11/25/2017).   Subjective:   Jodi [name] is a 41 y.o. female presenting today for evaluation of  Chief Complaint  Patient presents with  . Allergies    runny nose, itchy/watery eyes.   . Asthma    frequent exacerbations with the changes in weather    Jodi [name] has a history of the following: Patient Active Problem List   Diagnosis Date Noted  . Moderate persistent asthma, uncomplicated 09/24/2017  . Seasonal and perennial allergic rhinitis 09/24/2017  . SI joint arthritis (HCC) 02/21/2016  . SI (sacroiliac) joint dysfunction 09/04/2015  . Fall from horse 07/30/2015  . Lumbar transverse process fracture (HCC) 07/30/2015  . Pelvic ring fracture (HCC) 07/28/2015  . Primary generalized (osteo)arthritis 03/13/2015  . Asthma 11/17/2014  . GERD (gastroesophageal reflux disease) 11/17/2014  . Vitamin D deficiency 11/04/2014  . Dyslipidemia 08/11/2012  . 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 Allergic rhinitis 02/19/2010    History obtained from: chart review and patient.  Jodi [name] was referred by Kari BaarsHawkins, Edward, MD.     Jodi [name] is a 41 y.o. female presenting for an allergy and asthma evaluation.   Asthma/Respiratory Symptom History: She was diagnosed with asthma  as a child and she grew out of it. Around 2012, it came back with a vengeance. Sometime around this, she quite smoking. She has been on Xyzal, Singulair, albuterol, and Flonase. She was on Advair at some point - around two years ago. She felt  that it "sit in her chest", so she stopped. Prior to this, she was on Symbicort but Advair seemed to work better. She has never been on [place]. She was on Breo, which was a sample provided by Dr. Juanetta Gosling until she got over her flare ups. She estimates that she is in prednisone 4 times per year (finished one round yesterday). Prednisone typically does provide relief. She has never undergone full pulmonary function testing to her knowledge. She has not been hospitalized recently for her asthma, but the last time was a few years ago in February 2017. She has never been intubated.   Of note, she smoked for ten years (2002 through 2012, one pack every 3 days).   Allergic Rhinitis Symptom History: She has a history of recurrent sinusitis. She endorses runny nose and itchy watery eyes. She is on Flonase every day and Xyzal every day. This was started years ago. She does feel when she has gone without these medications. Singulair was also started years ago.   She has a history of swelling over her entire body with asp stings. There is no up to date EpiPen. Last sting was 12 years ago. Otherwise, there is no history of other atopic diseases, including drug allergies, food allergies, stinging insect allergies, or urticaria. There is no significant infectious history. Vaccinations are up to date.    Past Medical History: Patient Active Problem List   Diagnosis Date Noted  . Moderate persistent asthma, uncomplicated 09/24/2017  . Seasonal and perennial allergic rhinitis 09/24/2017  . SI joint arthritis (HCC) 02/21/2016  . SI (sacroiliac) joint dysfunction 09/04/2015  . Fall from horse 07/30/2015  . Lumbar transverse process fracture (HCC) 07/30/2015  . Pelvic ring fracture (HCC) 07/28/2015  . Primary generalized (osteo)arthritis 03/13/2015  . Asthma 11/17/2014  . GERD (gastroesophageal reflux disease) 11/17/2014  . Vitamin D deficiency 11/04/2014  . Dyslipidemia 08/11/2012  . Lung granuloma (HCC)  07/22/2011  . Allergic rhinitis 02/19/2010    Medication List:  Allergies as of 09/24/2017      Reactions   Compazine [prochlorperazine] Other (See Comments)   Can not control facial muscles, face swells, tongue sticks out, lost oral control      Medication List        Accurate as of 09/24/17  4:11 PM. Always use your most recent med list.          albuterol 108 (90 Base) MCG/ACT inhaler Commonly known as:  PROVENTIL HFA;VENTOLIN HFA Inhale 2 puffs into the lungs every 6 (six) hours as needed for wheezing or shortness of breath.   celecoxib 100 MG capsule Commonly known as:  CELEBREX Take 100 mg by mouth daily.   desipramine 50 MG tablet Commonly known as:  NORPRAMIN Take 50 mg by mouth at bedtime.   fluticasone 50 MCG/ACT nasal spray Commonly known as:  FLONASE USE TWO SPRAY(S) IN EACH NOSTRIL ONCE DAILY   ipratropium-albuterol 0.5-2.5 (3) MG/3ML Soln Commonly known as:  DUONEB Take 3 mLs by nebulization every 6 (six) hours as needed (shortness of breath).   levocetirizine 5 MG tablet Commonly known as:  XYZAL TAKE ONE TABLET BY MOUTH ONCE DAILY IN THE EVENING   montelukast 10 MG tablet Commonly known as:  SINGULAIR TAKE ONE TABLET BY MOUTH IN THE MORNING   naproxen sodium 220 MG tablet Commonly known as:  ALEVE Take 440 mg by mouth 2 (two) times daily with a meal.   omeprazole 20 MG capsule Commonly known as:  PRILOSEC Take 1 capsule (20 mg total) by mouth daily.       Birth History: non-contributory.  Developmental History: non-contributory.   Past Surgical History: Past Surgical History:  Procedure Laterality Date  . ABDOMINAL HYSTERECTOMY  4-5 yrs ago  . ABDOMINAL HYSTERECTOMY    . blood clot removed from lower abdomen  2000  . CHOLECYSTECTOMY  11/12/2012   Procedure: LAPAROSCOPIC CHOLECYSTECTOMY;  Surgeon: Dalia HeadingMark A Jenkins, MD;  Location: [facility];  Service: General;  Laterality: N/A;  . CHOLECYSTECTOMY    . COLONOSCOPY  11/04/2012   Procedure:  COLONOSCOPY;  Surgeon: [name], MD;  Location: [facility];  Service: Endoscopy;  Laterality: N/A;  915  . cyst removed from ovary and fluid removed from tubes  2000  . ESOPHAGOGASTRODUODENOSCOPY  10/22/2012   Procedure: ESOPHAGOGASTRODUODENOSCOPY (EGD);  Surgeon: [name], MD;  Location: [facility];  Service: Endoscopy;  Laterality: N/A;  100  . HARDWARE REMOVAL Left 02/21/2016   Procedure: HARDWARE REMOVAL LEFT SACROILIAC JOINT;  Surgeon: Myrene GalasMichael Handy, MD;  [facility];  Service: Orthopedics;  Laterality: Left;  . ORIF PELVIC FRACTURE N/A 07/30/2015   Procedure: OPEN REDUCTION INTERNAL FIXATION (ORIF) PELVIC FRACTURE;  Surgeon: Myrene GalasMichael Handy, MD;  [facility];  Service: Orthopedics;  Laterality: N/A;  . PARTIAL HYSTERECTOMY  2009  . SACRO-ILIAC PINNING Right 07/30/2015   Procedure: Loyal GamblerSACRO-ILIAC PINNING;  Surgeon: Myrene GalasMichael Handy, MD;  [facility];  Service: Orthopedics;  Laterality: Right;  . SACRO-ILIAC PINNING Left 09/04/2015   Procedure: LEFT TO RIGHT TRANS SACRO-ILIAC SCREW;  Surgeon: Myrene GalasMichael Handy, MD;  [facility];  Service: Orthopedics;  Laterality: Left;  . SACROILIAC JOINT FUSION Left 02/21/2016   Procedure: LEFT SACROILIAC JOINT FUSION;  Surgeon: Myrene GalasMichael Handy, MD;  [facility];  Service: Orthopedics;  Laterality: Left;  . TUBAL LIGATION  1999     Family History: Family History  Problem Relation Age of Onset  . Asthma Mother   . Hypertension Mother   . Bronchiolitis Mother   . Hypertension Father   . Hyperlipidemia Father        recurrent rectum polyps   . Diabetes Sister   . Lupus Sister   . Depression Sister      Social History: Jodi [name] lives at home with her family. They live in a 43yo home. There are carpeting in the main living areas and tile in the bedrooms. They have electric heating and window units for cooling. There are dogs, horses, donkeys, goat, turkeys, and [place] on her property; only the dogs are inside of the home.  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      Review of Systems: a 14-point review of systems is pertinent for what is mentioned in HPI.  Otherwise, all other systems were negative. Constitutional: negative other than that listed in the HPI Eyes: negative other than that listed in the HPI Ears, nose, mouth, throat, and face: negative other than that listed in the HPI Respiratory: negative other than that listed in the HPI Cardiovascular: negative other than that listed in the HPI Gastrointestinal: negative other than that listed in the HPI Genitourinary: negative  other than that listed in the HPI Integument: negative other than that listed in the HPI Hematologic: negative other than that listed in the HPI Musculoskeletal: negative other than that listed in the HPI Neurological: negative other than that listed in the HPI Allergy/Immunologic: negative other than that listed in the HPI    Objective:   Blood pressure 130/80, pulse (!) 102, resp. rate 18, height 5\' 5"  (1.651 m), weight 209 lb (94.8 kg), last menstrual period 05/17/2012, SpO2 98 %. Body mass index is 34.78 kg/m.   Physical Exam:  General: Alert, interactive, in no acute distress. Mild distress when we attempted the spirometry.  Eyes: Conjunctival injection on the right with limbal sparing, Conjunctival injection on the left with limbal sparing, no discharge on the right, no discharge on the left and no Horner-Trantas dots present. PERRL bilaterally. EOMI without pain. No photophobia.  Ears: Right TM pearly gray with normal light reflex, Left TM pearly gray with normal light reflex, Right TM intact without perforation and Left TM intact without perforation.  Nose/Throat: External nose within normal limits and septum midline. Turbinates edematous and pale with clear discharge. Posterior oropharynx erythematous with cobblestoning in the posterior  oropharynx. Tonsils 2+ without exudates.  Tongue without thrush. Neck: Supple without thyromegaly. Trachea midline. Adenopathy: no enlarged lymph nodes appreciated in the anterior cervical, occipital, axillary, epitrochlear, inguinal, or popliteal regions. Lungs: Clear to auscultation without wheezing, rhonchi or rales. No increased work of breathing. CV: Normal S1/S2. No murmurs. Capillary refill <2 seconds.  Abdomen: Nondistended, nontender. No guarding or rebound tenderness. Bowel sounds absent, faint, present in all fields, hypoactive and hyperactive  Skin: Warm and dry, without lesions or rashes. Extremities:  No clubbing, cyanosis or edema. Neuro:   Grossly intact. No focal deficits appreciated. Responsive to questions.  Diagnostic studies:  Spirometry: results normal (FEV1: 2.23/87%, FVC: 2.44/78%, FEV1/FVC: 91%).    Spirometry consistent with normal pattern (this was actually done AFTER a nebulizer treatment since she was coughing so much during the first attempt at the spirometry.   Allergy Studies:   Indoor/Outdoor Percutaneous Adult Environmental Panel: negative to the entire panel with adequate controls.  Indoor/Outdoor Selected Intradermal Environmental Panel: positive to [place] grass, Johnson grass, ragweed mix, mold mix #2, mold mix #3, cat, dog and mite mix. Otherwise negative with adequate controls.      Malachi Bonds, MD [facility]

## 2017-09-25 ENCOUNTER — Telehealth: Payer: Self-pay

## 2017-09-25 NOTE — Telephone Encounter (Signed)
Pt's rx for Dulera 200-445mcg is not covered by insurance. Insurance prefers Asmanex, Qvar, Designer, jewelleryAlvesco. Would you like to change rx or do a PA for [facility]?

## 2017-09-26 NOTE — Telephone Encounter (Signed)
Please fill out a PA. She has failed Symbicort, Advair, and Breo. She needs a combined ICS/LABA with her frequency of symptoms, therefore Asmanex, Qvar, and Alvesco are not indicated.   Thanks, Malachi BondsJoel Aulden Calise, MD [facility]

## 2017-09-28 DIAGNOSIS — J301 Allergic rhinitis due to pollen: Secondary | ICD-10-CM | POA: Diagnosis not present

## 2017-09-28 DIAGNOSIS — I10 Essential (primary) hypertension: Secondary | ICD-10-CM | POA: Diagnosis not present

## 2017-09-28 DIAGNOSIS — J45901 Unspecified asthma with (acute) exacerbation: Secondary | ICD-10-CM | POA: Diagnosis not present

## 2017-09-29 NOTE — Telephone Encounter (Signed)
PA submitted through Cover My meds for [facility]

## 2017-10-14 DIAGNOSIS — J209 Acute bronchitis, unspecified: Secondary | ICD-10-CM | POA: Diagnosis not present

## 2017-10-14 DIAGNOSIS — R079 Chest pain, unspecified: Secondary | ICD-10-CM | POA: Diagnosis not present

## 2017-10-14 DIAGNOSIS — J4551 Severe persistent asthma with (acute) exacerbation: Secondary | ICD-10-CM | POA: Diagnosis not present

## 2017-11-04 DIAGNOSIS — M2550 Pain in unspecified joint: Secondary | ICD-10-CM | POA: Diagnosis not present

## 2017-11-04 DIAGNOSIS — R21 Rash and other nonspecific skin eruption: Secondary | ICD-10-CM | POA: Diagnosis not present

## 2017-11-04 DIAGNOSIS — J4551 Severe persistent asthma with (acute) exacerbation: Secondary | ICD-10-CM | POA: Diagnosis not present

## 2017-11-06 ENCOUNTER — Telehealth: Payer: Self-pay | Admitting: Family Medicine

## 2017-11-06 NOTE — Telephone Encounter (Signed)
Pt called in wants to be reffered to  rheumatology

## 2017-11-11 ENCOUNTER — Telehealth: Payer: Self-pay | Admitting: Family Medicine

## 2017-11-11 NOTE — Telephone Encounter (Signed)
Please advise 

## 2017-11-11 NOTE — Telephone Encounter (Signed)
Dr. Juanetta GoslingHawkins is requesting for patient to be referred to a rheumatology due to  Asthma flare ups, cant get it under control. Joints are bothering her. She is achey all over her body Skin recently started breaking out on both hands and she denies ever having skin issues. Cb#: [phone]

## 2017-11-12 NOTE — Telephone Encounter (Signed)
Not my patient

## 2017-11-12 NOTE — Telephone Encounter (Signed)
See previous message that was sent to md

## 2017-11-13 ENCOUNTER — Telehealth: Payer: Self-pay | Admitting: Family Medicine

## 2017-11-13 NOTE — Telephone Encounter (Signed)
Please contact [name] office and let them know that he is listed as her PCP  On the record we have.  So referral to the rheumatologist would be from him  I have not seen her  in over 2.5 years.    I was, but am no longer her PCP, and am not re establishing the relationship

## 2017-11-13 NOTE — Telephone Encounter (Signed)
Dr [name] hasn't seen this pt in several years, and per the note the New PCP is [name].. We are not seeing this pt going forward.--I will be following up with [name] to advise that he is listed as the PCP

## 2017-11-16 NOTE — Telephone Encounter (Signed)
Called Dr Hawkins office, spoke with Becky- Advised her that we are not the PT PCP, Dr Hawkins is, she confirmed that is what they had. Also she looked back at the notes and seen that he wanted her to be referred, but she wasn't sure why it came to us, Becky will get with Dr Hawkins to advised they need to Refer the Pt. °

## 2017-11-16 NOTE — Telephone Encounter (Signed)
Called [name] office, spoke with Kriste BasqueBecky- Advised her that we are not the PT PCP, [name] is, she confirmed that is what they had. Also she looked back at the notes and seen that he wanted her to be referred, but she wasn't sure why it came to us, Kriste BasqueBecky will get with [name] to advised they need to Refer the Pt.

## 2017-11-17 ENCOUNTER — Telehealth: Payer: Self-pay | Admitting: Family Medicine

## 2017-11-17 NOTE — Telephone Encounter (Signed)
Patient left a message on [employer] answering machine checking the status of a rheumatology referral. After reviewing previous messages, I called her back (no answer, no voicemail) I left her a message letting her know to consult with her

## 2017-11-19 DIAGNOSIS — R1011 Right upper quadrant pain: Secondary | ICD-10-CM | POA: Diagnosis not present

## 2017-12-03 ENCOUNTER — Ambulatory Visit: Payer: 59 | Admitting: Allergy & Immunology

## 2017-12-07 DIAGNOSIS — M1990 Unspecified osteoarthritis, unspecified site: Secondary | ICD-10-CM | POA: Diagnosis not present

## 2017-12-07 DIAGNOSIS — J4551 Severe persistent asthma with (acute) exacerbation: Secondary | ICD-10-CM | POA: Diagnosis not present

## 2017-12-07 DIAGNOSIS — J301 Allergic rhinitis due to pollen: Secondary | ICD-10-CM | POA: Diagnosis not present

## 2017-12-10 ENCOUNTER — Ambulatory Visit: Payer: 59 | Admitting: Allergy & Immunology

## 2017-12-10 ENCOUNTER — Encounter: Payer: Self-pay | Admitting: Allergy & Immunology

## 2017-12-10 VITALS — BP 130/90 | HR 76 | Temp 98.6°F | Resp 16

## 2017-12-10 DIAGNOSIS — J4540 Moderate persistent asthma, uncomplicated: Secondary | ICD-10-CM | POA: Diagnosis not present

## 2017-12-10 DIAGNOSIS — J3089 Other allergic rhinitis: Secondary | ICD-10-CM | POA: Diagnosis not present

## 2017-12-10 DIAGNOSIS — K219 Gastro-esophageal reflux disease without esophagitis: Secondary | ICD-10-CM | POA: Diagnosis not present

## 2017-12-10 DIAGNOSIS — J302 Other seasonal allergic rhinitis: Secondary | ICD-10-CM | POA: Diagnosis not present

## 2017-12-10 MED ORDER — EPINEPHRINE 0.3 MG/0.3ML IJ SOAJ: 0.3000 mg | Freq: Once | INTRAMUSCULAR | 1 refills | Status: AC

## 2017-12-10 MED ORDER — TIOTROPIUM BROMIDE MONOHYDRATE 1.25 MCG/ACT IN AERS: 2.0000 | INHALATION_SPRAY | Freq: Every day | RESPIRATORY_TRACT | 5 refills | Status: DC

## 2017-12-10 NOTE — Progress Notes (Addendum)
FOLLOW UP  Date of Service/Encounter:  12/10/17   Assessment:   Moderate persistent asthma - likely with VCD component  Gastroesophageal reflux disease - on PPI  Seasonal and perennial allergic rhinitis (weeds, grasses, indoor molds, outdoor molds, dust mites, cat and dog)  Plan/Recommendations:   1. Moderate persistent asthma - with a likely VCD component - Lung testing actually looked surprisingly good.  - I still think that she has a strong component of VCD, but we will add on Spiriva to see if this will help. - Samples of Spiriva provided.  - We could consider speech therapy evaluation if she continues to need prednisone courses.  - If this indeed turns out to be asthma, we could consider the addition of an IL-5 agent with approval based on prednisone sparing.  - Could consider refill history as well.  - Daily controller medication(s): Singulair 10mg  daily, Dulera 200/88mcg two puffs twice daily with spacer and Spiriva 1.38mcg two puffs once daily - Prior to physical activity: ProAir 2 puffs 10-15 minutes before physical activity. - Rescue medications: ProAir 4 puffs every 4-6 hours as needed - Asthma control goals:  * Full participation in all desired activities (may need albuterol before activity) * Albuterol use two time or less a week on average (not counting use with activity) * Cough interfering with sleep two time or less a month * Oral steroids no more than once a year * No hospitalizations  2. Chronic rhinitis (weeds, grasses, indoor molds, outdoor molds, dust mites, cat and dog) - Continue with: Xyzal (levocetirizine) 5mg  tablet once daily, Singulair (montelukast) 10mg  daily and Dymista (fluticasone/azelastine) two sprays per nostril 1-2 times daily as needed - You can use an extra dose of the antihistamine, if needed, for breakthrough symptoms.  - Consider nasal saline rinses 1-2 times daily to remove allergens from the nasal cavities as well as help with mucous  clearance (this is especially helpful to do before the nasal sprays are given) - Information provided on allergy shots. - Call to make sure they are covered and then call us back when you decide to proceed.  . 3. Reflux - Continue with Prilosec daily.  4. Return in about 3 months (around 03/09/2018).  Subjective:   Jodi [name] is a 42 y.o. female presenting today for follow up of  Chief Complaint  Patient presents with  . Asthma    Jodi [name] has a history of the following: Patient Active Problem List   Diagnosis Date Noted  . Moderate persistent asthma, uncomplicated 09/24/2017  . Seasonal and perennial allergic rhinitis 09/24/2017  . SI joint arthritis (HCC) 02/21/2016  . SI (sacroiliac) joint dysfunction 09/04/2015  . Fall from horse 07/30/2015  . Lumbar transverse process fracture (HCC) 07/30/2015  . Pelvic ring fracture (HCC) 07/28/2015  . Primary generalized (osteo)arthritis 03/13/2015  . Asthma 11/17/2014  . Gastroesophageal reflux disease 11/17/2014  . Vitamin D deficiency 11/04/2014  . Dyslipidemia 08/11/2012  . 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 Allergic rhinitis 02/19/2010    History obtained from: chart review and patient.  Jodi [name]'s Primary Care Provider is Jodi Baars, MD.     Jodi [name] is a 42 y.o. female presenting for a follow up visit. She was last seen in December 2018 for her first appointment. At that time, she was having such frequent symptoms that I recommended that she take Dulera 200/5 two puffs BID. I also added on Singulair 10mg  daily. She had testing that demonstrated positives to weeds,  grasses, indoor molds, outdoor molds, dust mites, cat and dog. I recommended stopping Flonase and started Xyzal 5mg  daily. We added on Dymista as well and briefly discussed allergy immunotherapy.   Since the last visit, she has mostly done well. She is getting over a week or so of some shortness of breath. She was actually put on steroid  around four weeks ago. She never got her pep back right away after the steroids. She did get worse before she got better. She was started back on steroids on Monday. [facility] but she has not noticed that it helped. She does feel that the steroids help. She sees Jodi [name] who always feels that she is wheezing. She estimates that she has been on prednisone a 5-8 courses of prednisone over the last calendar year for her asthma.   She is on the nasal spray for her allergies. She is also on an antihistamine. She does not feel that this is working and is interested in moving ahead with allergen immunotherapy. She has not talked to her insurance company about any expected copayments, but would like the information for this. She reports that she has many family members who are on allergy injections and she knows that they have worked with them. She would be able to get them weekly on [place].   Otherwise, there have been no changes to her past medical history, surgical history, family history, or social history.    Review of Systems: a 14-point review of systems is pertinent for what is mentioned in HPI.  Otherwise, all other systems were negative. Constitutional: negative other than that listed in the HPI Eyes: negative other than that listed in the HPI Ears, nose, mouth, throat, and face: negative other than that listed in the HPI Respiratory: negative other than that listed in the HPI Cardiovascular: negative other than that listed in the HPI Gastrointestinal: negative other than that listed in the HPI Genitourinary: negative other than that listed in the HPI Integument: negative other than that listed in the HPI Hematologic: negative other than that listed in the HPI Musculoskeletal: negative other than that listed in the HPI Neurological: negative other than that listed in the HPI Allergy/Immunologic: negative other than that listed in the HPI    Objective:   Blood  pressure 130/90, pulse 76, temperature 98.6 F (37 C), temperature source Oral, resp. rate 16, last menstrual period 05/17/2012, SpO2 97 %. There is no height or weight on file to calculate BMI.   Physical Exam:  General: Alert, interactive, in no acute distress. Pleasant and interactive.  Eyes: No conjunctival injection bilaterally, no discharge on the right, no discharge on the left and no Horner-Trantas dots present. PERRL bilaterally. EOMI without pain. No photophobia.  Ears: Right TM pearly gray with normal light reflex, Left TM pearly gray with normal light reflex, Right TM intact without perforation and Left TM intact without perforation.  Nose/Throat: External nose within normal limits and septum midline. Turbinates edematous and pale with clear discharge. Posterior oropharynx erythematous without cobblestoning in the posterior oropharynx. Tonsils 2+ without exudates.  Tongue without thrush. Lungs: Clear to auscultation without wheezing, rhonchi or rales. No increased work of breathing. CV: Normal S1/S2. No murmurs. Capillary refill <2 seconds.  Skin: Warm and dry, without lesions or rashes. Neuro:   Grossly intact. No focal deficits appreciated. Responsive to questions.  Diagnostic studies:   Spirometry: results normal (FEV1: 2.71/105%, FVC: 3.18/101%, FEV1/FVC: 85%).    Spirometry consistent with  normal pattern.   Allergy Studies: none        Malachi Bonds, MD [facility] [place]

## 2017-12-10 NOTE — Patient Instructions (Addendum)
1. Moderate persistent asthma, uncomplicated - Lung testing actually looked surprisingly good.  - We will add on Spiriva two puffs once daily (samples provided)  - Daily controller medication(s): Singulair 10mg  daily, Dulera 200/35mcg two puffs twice daily with spacer and Spiriva 1.58mcg two puffs once daily - Prior to physical activity: ProAir 2 puffs 10-15 minutes before physical activity. - Rescue medications: ProAir 4 puffs every 4-6 hours as needed - Asthma control goals:  * Full participation in all desired activities (may need albuterol before activity) * Albuterol use two time or less a week on average (not counting use with activity) * Cough interfering with sleep two time or less a month * Oral steroids no more than once a year * No hospitalizations  2. Chronic rhinitis (weeds, grasses, indoor molds, outdoor molds, dust mites, cat and dog) - Continue with: Xyzal (levocetirizine) 5mg  tablet once daily, Singulair (montelukast) 10mg  daily and Dymista (fluticasone/azelastine) two sprays per nostril 1-2 times daily as needed - You can use an extra dose of the antihistamine, if needed, for breakthrough symptoms.  - Consider nasal saline rinses 1-2 times daily to remove allergens from the nasal cavities as well as help with mucous clearance (this is especially helpful to do before the nasal sprays are given) - Information provided on allergy shots. - Call to make sure they are covered and then call us back when you decide to proceed.  . 3. Reflux - Continue with Prilosec daily.  4. Return in about 3 months (around 03/09/2018).   Please inform us of any Emergency Department visits, hospitalizations, or changes in symptoms. Call us before going to the ED for breathing or allergy symptoms since we might be able to fit you in for a sick visit. Feel free to contact us anytime with any questions, problems, or concerns.  It was a pleasure to see you and your family again today!  Websites that  have reliable patient information: 1. American Academy of Asthma, Allergy, and Immunology: www.aaaai.org 2. Food Allergy Research and Education (FARE): foodallergy.org 3. Mothers of Asthmatics: http://www.asthmacommunitynetwork.org 4. American College of Allergy, Asthma, and Immunology: www.acaai.org   Allergy Shots   Allergies are the result of a chain reaction that starts in the immune system. Your immune system controls how your body defends itself. For instance, if you have an allergy to pollen, your immune system identifies pollen as an invader or allergen. Your immune system overreacts by producing antibodies called Immunoglobulin E (IgE). These antibodies travel to cells that release chemicals, causing an allergic reaction.  The concept behind allergy immunotherapy, whether it is received in the form of shots or tablets, is that the immune system can be desensitized to specific allergens that trigger allergy symptoms. Although it requires time and patience, the payback can be long-term relief.  How Do Allergy Shots Work?  Allergy shots work much like a vaccine. Your body responds to injected amounts of a particular allergen given in increasing doses, eventually developing a resistance and tolerance to it. Allergy shots can lead to decreased, minimal or no allergy symptoms.  There generally are two phases: build-up and maintenance. Build-up often ranges from three to six months and involves receiving injections with increasing amounts of the allergens. The shots are typically given once or twice a week, though more rapid build-up schedules are sometimes used.  The maintenance phase begins when the most effective dose is reached. This dose is different for each person, depending on how allergic you are and your response to  the build-up injections. Once the maintenance dose is reached, there are longer periods between injections, typically two to four weeks.  Occasionally doctors give  cortisone-type shots that can temporarily reduce allergy symptoms. These types of shots are different and should not be confused with allergy immunotherapy shots.  Who Can Be Treated with Allergy Shots?  Allergy shots may be a good treatment approach for people with allergic rhinitis (hay fever), allergic asthma, conjunctivitis (eye allergy) or stinging insect allergy.   Before deciding to begin allergy shots, you should consider:  . The length of allergy season and the severity of your symptoms . Whether medications and/or changes to your environment can control your symptoms . Your desire to avoid long-term medication use . Time: allergy immunotherapy requires a major time commitment . Cost: may vary depending on your insurance coverage  Allergy shots for children age 63five and older are effective and often well tolerated. They might prevent the onset of new allergen sensitivities or the progression to asthma.  Allergy shots are not started on patients who are pregnant but can be continued on patients who become pregnant while receiving them. In some patients with other medical conditions or who take certain common medications, allergy shots may be of risk. It is important to mention other medications you talk to your allergist.   When Will I Feel Better?  Some may experience decreased allergy symptoms during the build-up phase. For others, it may take as long as 12 months on the maintenance dose. If there is no improvement after a year of maintenance, your allergist will discuss other treatment options with you.  If you aren't responding to allergy shots, it may be because there is not enough dose of the allergen in your vaccine or there are missing allergens that were not identified during your allergy testing. Other reasons could be that there are high levels of the allergen in your environment or major exposure to non-allergic triggers like tobacco smoke.  What Is the Length of  Treatment?  Once the maintenance dose is reached, allergy shots are generally continued for three to five years. The decision to stop should be discussed with your allergist at that time. Some people may experience a permanent reduction of allergy symptoms. Others may relapse and a longer course of allergy shots can be considered.  What Are the Possible Reactions?  The two types of adverse reactions that can occur with allergy shots are local and systemic. Common local reactions include very mild redness and swelling at the injection site, which can happen immediately or several hours after. A systemic reaction, which is less common, affects the entire body or a particular body system. They are usually mild and typically respond quickly to medications. Signs include increased allergy symptoms such as sneezing, a stuffy nose or hives.  Rarely, a serious systemic reaction called anaphylaxis can develop. Symptoms include swelling in the throat, wheezing, a feeling of tightness in the chest, nausea or dizziness. Most serious systemic reactions develop within 30 minutes of allergy shots. This is why it is strongly recommended you wait in your doctor's office for 30 minutes after your injections. Your allergist is trained to watch for reactions, and his or her staff is trained and equipped with the proper medications to identify and treat them.  Who Should Administer Allergy Shots?  The preferred location for receiving shots is your prescribing allergist's office. Injections can sometimes be given at another facility where the physician and staff are trained to recognize and  treat reactions, and have received instructions by your prescribing allergist.

## 2017-12-13 NOTE — Progress Notes (Signed)
We received notification from Ms. Jodi [name] that she is interested in pursuing allergen immunotherapy. Prescriptions written and routed to the Immunotherapy Team.   Jodi BondsJoel Lexington Krotz, MD [facility] [place]

## 2017-12-13 NOTE — Addendum Note (Signed)
Addended by: Alfonse SpruceGALLAGHER, Rosaria Kubin [place] on: 12/13/2017 08:43 PM   Modules accepted: Orders

## 2017-12-15 DIAGNOSIS — D8989 Other specified disorders involving the immune mechanism, not elsewhere classified: Secondary | ICD-10-CM | POA: Diagnosis not present

## 2017-12-15 DIAGNOSIS — M79672 Pain in left foot: Secondary | ICD-10-CM | POA: Diagnosis not present

## 2017-12-15 DIAGNOSIS — M79671 Pain in right foot: Secondary | ICD-10-CM | POA: Diagnosis not present

## 2017-12-15 DIAGNOSIS — M1990 Unspecified osteoarthritis, unspecified site: Secondary | ICD-10-CM | POA: Diagnosis not present

## 2017-12-15 DIAGNOSIS — M25561 Pain in right knee: Secondary | ICD-10-CM | POA: Diagnosis not present

## 2017-12-15 DIAGNOSIS — M79642 Pain in left hand: Secondary | ICD-10-CM | POA: Diagnosis not present

## 2017-12-15 DIAGNOSIS — M79641 Pain in right hand: Secondary | ICD-10-CM | POA: Diagnosis not present

## 2017-12-15 DIAGNOSIS — M79643 Pain in unspecified hand: Secondary | ICD-10-CM | POA: Diagnosis not present

## 2017-12-15 DIAGNOSIS — R52 Pain, unspecified: Secondary | ICD-10-CM | POA: Diagnosis not present

## 2017-12-15 DIAGNOSIS — M7989 Other specified soft tissue disorders: Secondary | ICD-10-CM | POA: Diagnosis not present

## 2017-12-16 DIAGNOSIS — J3089 Other allergic rhinitis: Secondary | ICD-10-CM | POA: Diagnosis not present

## 2017-12-16 NOTE — Progress Notes (Signed)
VIALS EXP 12-17-18

## 2017-12-17 DIAGNOSIS — J302 Other seasonal allergic rhinitis: Secondary | ICD-10-CM | POA: Diagnosis not present

## 2017-12-21 ENCOUNTER — Encounter (HOSPITAL_COMMUNITY): Payer: Self-pay

## 2017-12-21 ENCOUNTER — Emergency Department (HOSPITAL_COMMUNITY)
Admission: EM | Admit: 2017-12-21 | Discharge: 2017-12-21 | Disposition: A | Discharge status: Home / Self Care | Payer: 59 | Attending: Emergency Medicine | Admitting: Emergency Medicine

## 2017-12-21 DIAGNOSIS — Z87891 Personal history of nicotine dependence: Secondary | ICD-10-CM | POA: Insufficient documentation

## 2017-12-21 DIAGNOSIS — M19071 Primary osteoarthritis, right ankle and foot: Secondary | ICD-10-CM | POA: Insufficient documentation

## 2017-12-21 DIAGNOSIS — Z79899 Other long term (current) drug therapy: Secondary | ICD-10-CM | POA: Diagnosis not present

## 2017-12-21 DIAGNOSIS — M19072 Primary osteoarthritis, left ankle and foot: Secondary | ICD-10-CM | POA: Diagnosis not present

## 2017-12-21 DIAGNOSIS — Z9049 Acquired absence of other specified parts of digestive tract: Secondary | ICD-10-CM | POA: Diagnosis not present

## 2017-12-21 DIAGNOSIS — J45901 Unspecified asthma with (acute) exacerbation: Secondary | ICD-10-CM | POA: Insufficient documentation

## 2017-12-21 DIAGNOSIS — M25571 Pain in right ankle and joints of right foot: Secondary | ICD-10-CM | POA: Diagnosis present

## 2017-12-21 MED ORDER — ONDANSETRON HCL 4 MG PO TABS
4.0000 mg | ORAL_TABLET | Freq: Once | ORAL | Status: AC
  Administered 2017-12-21: 4 mg via ORAL
  Filled 2017-12-21: qty 1

## 2017-12-21 MED ORDER — HYDROCODONE-ACETAMINOPHEN 5-325 MG PO TABS
2.0000 | ORAL_TABLET | Freq: Once | ORAL | Status: AC
  Administered 2017-12-21: 2 via ORAL
  Filled 2017-12-21: qty 2

## 2017-12-21 MED ORDER — IPRATROPIUM-ALBUTEROL 0.5-2.5 (3) MG/3ML IN SOLN
3.0000 mL | Freq: Once | RESPIRATORY_TRACT | Status: AC
  Administered 2017-12-21: 3 mL via RESPIRATORY_TRACT
  Filled 2017-12-21: qty 3

## 2017-12-21 MED ORDER — HYDROCODONE-ACETAMINOPHEN 5-325 MG PO TABS: 1.0000 | ORAL_TABLET | ORAL | 0 refills | Status: DC | PRN

## 2017-12-21 MED ORDER — INDOMETHACIN 25 MG PO CAPS
25.0000 mg | ORAL_CAPSULE | Freq: Once | ORAL | Status: AC
  Administered 2017-12-21: 25 mg via ORAL
  Filled 2017-12-21: qty 1

## 2017-12-21 MED ORDER — DEXAMETHASONE SODIUM PHOSPHATE 10 MG/ML IJ SOLN
10.0000 mg | Freq: Once | INTRAMUSCULAR | Status: AC
  Administered 2017-12-21: 10 mg via INTRAMUSCULAR
  Filled 2017-12-21: qty 1

## 2017-12-21 NOTE — ED Provider Notes (Signed)
[facility] EMERGENCY DEPARTMENT Provider Note   CSN: [id number] Arrival date & time: 12/21/17  0744     History   Chief Complaint Chief Complaint  Patient presents with  . Ankle Pain    HPI Jodi [name] is a 42 y.o. female.  Patient is a 42 year old female who presents to the emergency department with complaint of ankle pain.  The patient states that she has a problem with chronic pain syndrome.  She also is treated for arthritis.  She is seen by rheumatology for pain usually in her knees.  Patient states that she is on a steroid Dosepak.  She is a little over half way through it.  She is concerned as to whether the pain in her ankles could be related to the steroid Dosepak.  She states that her ankles are warm to touch and painful.  She has pain especially when she stands and applies weight.  There is been no injury to the ankles.  The patient denies being more active than usual she is not done any extra standing, walking, climbing, etc.  There is been no recent injury or trauma to the lower extremities.  No procedures.  Patient states she also has asthma, she has been using her albuterol inhaler, but continues to have some wheezing and some sensation of shortness of breath.  She presents to the emergency department at this time for assistance with these issues.  She has some cough, but mostly nonproductive.  She denies any hemoptysis, and she denies any high fever.      Past Medical History:  Diagnosis Date  . Abdominal pain, chronic, right upper quadrant   . Acute recurrent maxillary sinusitis 11/04/2014  . Allergic rhinitis   . Anemia   . Arthritis    knee  . Asthma    Cough variant  . Asthma   . Asthma   . Back pain   . Chronic pain syndrome 06/08/2013  . DVT (deep venous thrombosis) (HCC) 10 yrs ago  . Dyslipidemia 08/11/2012  . Eczema   . GERD (gastroesophageal reflux disease)   . Heart murmur    as a child  . Hyperlipidemia   . IBS (irritable bowel  syndrome)   . 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 Pneumonia   . Shortness of breath    SOB even without exertion at times  . Sickle-cell trait [facility])     Patient Active Problem List   Diagnosis Date Noted  . Moderate persistent asthma, uncomplicated 09/24/2017  . Seasonal and perennial allergic rhinitis 09/24/2017  . SI joint arthritis (HCC) 02/21/2016  . SI (sacroiliac) joint dysfunction 09/04/2015  . Fall from horse 07/30/2015  . Lumbar transverse process fracture (HCC) 07/30/2015  . Pelvic ring fracture (HCC) 07/28/2015  . Primary generalized (osteo)arthritis 03/13/2015  . Asthma 11/17/2014  . Gastroesophageal reflux disease 11/17/2014  . Vitamin D deficiency 11/04/2014  . Dyslipidemia 08/11/2012  . 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 Allergic rhinitis 02/19/2010    Past Surgical History:  Procedure Laterality Date  . ABDOMINAL HYSTERECTOMY  4-5 yrs ago  . ABDOMINAL HYSTERECTOMY    . blood clot removed from lower abdomen  2000  . CHOLECYSTECTOMY  11/12/2012   Procedure: LAPAROSCOPIC CHOLECYSTECTOMY;  Surgeon: Dalia Heading, MD;  Location: [facility] ORS;  Service: General;  Laterality: N/A;  . CHOLECYSTECTOMY    . COLONOSCOPY  11/04/2012   Procedure: COLONOSCOPY;  Surgeon: Malissa Hippo, MD;  Location: [facility] ENDO  SUITE;  Service: Endoscopy;  Laterality: N/A;  915  . cyst removed from ovary and fluid removed from tubes  2000  . ESOPHAGOGASTRODUODENOSCOPY  10/22/2012   Procedure: ESOPHAGOGASTRODUODENOSCOPY (EGD);  Surgeon: Malissa Hippo, MD;  Location: [facility] ENDO SUITE;  Service: Endoscopy;  Laterality: N/A;  100  . HARDWARE REMOVAL Left 02/21/2016   Procedure: HARDWARE REMOVAL LEFT SACROILIAC JOINT;  Surgeon: Myrene Galas, MD;  Location: [facility] OR;  Service: Orthopedics;  Laterality: Left;  . ORIF PELVIC FRACTURE N/A 07/30/2015   Procedure: OPEN REDUCTION INTERNAL FIXATION (ORIF) PELVIC FRACTURE;  Surgeon: Myrene Galas, MD;  Location: [facility] OR;  Service: Orthopedics;   Laterality: N/A;  . PARTIAL HYSTERECTOMY  2009  . SACRO-ILIAC PINNING Right 07/30/2015   Procedure: Loyal Gambler;  Surgeon: Myrene Galas, MD;  Location: [facility] OR;  Service: Orthopedics;  Laterality: Right;  . SACRO-ILIAC PINNING Left 09/04/2015   Procedure: LEFT TO RIGHT TRANS SACRO-ILIAC SCREW;  Surgeon: Myrene Galas, MD;  Location: [facility] OR;  Service: Orthopedics;  Laterality: Left;  . SACROILIAC JOINT FUSION Left 02/21/2016   Procedure: LEFT SACROILIAC JOINT FUSION;  Surgeon: Myrene Galas, MD;  Location: [facility] OR;  Service: Orthopedics;  Laterality: Left;  . TUBAL LIGATION  1999    OB History    Gravida Para Term Preterm AB Living   0 0 0 0 0     SAB TAB Ectopic Multiple Live Births   0 0 0           Home Medications    Prior to Admission medications   Medication Sig Start Date End Date Taking? Authorizing Provider  albuterol (PROVENTIL HFA;VENTOLIN HFA) 108 (90 BASE) MCG/ACT inhaler Inhale 2 puffs into the lungs every 6 (six) hours as needed for wheezing or shortness of breath. 05/08/15   Kerri Perches, MD  Azelastine-Fluticasone 137-50 MCG/ACT SUSP Place 2 sprays into both nostrils See admin instructions. 1-2 times daily. 09/24/17   Alfonse Spruce, MD  celecoxib (CELEBREX) 100 MG capsule Take 100 mg by mouth daily.    [provider]  desipramine (NORPRAMIN) 50 MG tablet Take 50 mg by mouth at bedtime.    [provider]  ipratropium-albuterol (DUONEB) 0.5-2.5 (3) MG/3ML SOLN Take 3 mLs by nebulization every 6 (six) hours as needed (shortness of breath).     [provider]  levocetirizine (XYZAL) 5 MG tablet TAKE ONE TABLET BY MOUTH ONCE DAILY IN THE EVENING 04/28/16   Kerri Perches, MD  mometasone-formoterol [facility]) 200-5 MCG/ACT AERO Inhale 2 puffs into the lungs 2 (two) times daily. With spacer 09/24/17   Alfonse Spruce, MD  montelukast (SINGULAIR) 10 MG tablet TAKE ONE TABLET BY MOUTH IN THE MORNING 04/28/16   Kerri Perches, MD  naproxen sodium (ANAPROX) 220 MG tablet Take 440 mg by mouth 2 (two) times daily with a meal.    [provider]  omeprazole (PRILOSEC) 20 MG capsule Take 1 capsule (20 mg total) by mouth daily. 01/05/14   Setzer, Brand Males, NP  predniSONE (DELTASONE) 10 MG tablet Take 10 mg by mouth daily with breakfast.    [provider]  Tiotropium Bromide Monohydrate (SPIRIVA RESPIMAT) 1.25 MCG/ACT AERS Inhale 2 puffs into the lungs daily. 12/10/17   Alfonse Spruce, MD    Family History Family History  Problem Relation Age of Onset  . Asthma Mother   . Hypertension Mother   . Bronchiolitis Mother   . Hypertension Father   . Hyperlipidemia Father  recurrent rectum polyps   . Diabetes Sister   . Lupus Sister   . Depression Sister     Social History Social History   Tobacco Use  . Smoking status: Former Smoker    Packs/day: 0.25    Years: 10.00    Pack years: 2.50    Types: Cigarettes    Last attempt to quit: 10/13/2008    Years since quitting: 9.1  . Smokeless tobacco: Never Used  Substance Use Topics  . Alcohol use: No  . Drug use: No     Allergies   Compazine [prochlorperazine]   Review of Systems Review of Systems  Constitutional: Negative for activity change.       All ROS Neg except as noted in HPI  HENT: Negative for nosebleeds.   Eyes: Negative for photophobia and discharge.  Respiratory: Positive for cough and shortness of breath. Negative for wheezing.   Cardiovascular: Negative for chest pain and palpitations.  Gastrointestinal: Negative for abdominal pain and blood in stool.  Genitourinary: Negative for dysuria, frequency and hematuria.  Musculoskeletal: Positive for arthralgias. Negative for back pain and neck pain.  Skin: Negative.   Neurological: Negative for dizziness, seizures and speech difficulty.  Psychiatric/Behavioral: Negative for confusion and hallucinations.     Physical Exam Updated Vital Signs BP (!)  143/109 (BP Location: Right Arm)   Pulse (!) 113   Temp 98.9 F (37.2 C) (Oral)   Resp 20   Ht 5\' 6"  (1.676 m)   Wt 97.5 kg (215 lb)   LMP 05/17/2012   SpO2 97%   BMI 34.70 kg/m   Physical Exam  Constitutional: She is oriented to person, place, and time. She appears well-developed and well-nourished.  Non-toxic appearance.  HENT:  Head: Normocephalic.  Right Ear: Tympanic membrane and external ear normal.  Left Ear: Tympanic membrane and external ear normal.  Eyes: EOM and lids are normal. Pupils are equal, round, and reactive to light.  Neck: Normal range of motion. Neck supple. Carotid bruit is not present.  Cardiovascular: Normal rate, regular rhythm, normal heart sounds, intact distal pulses and normal pulses.  Pulmonary/Chest: Breath sounds normal. No respiratory distress.  Abdominal: Soft. Bowel sounds are normal. There is no tenderness. There is no guarding.  Musculoskeletal:       Right ankle: She exhibits decreased range of motion. Tenderness. Lateral malleolus and medial malleolus tenderness found. Achilles tendon normal.       Left ankle: She exhibits decreased range of motion. Tenderness. Lateral malleolus and medial malleolus tenderness found. Achilles tendon normal.  Radial pulses are 2+ bilaterally.  Dorsalis pedis pulses are 2+ bilaterally.  The wrist are warm to touch, the ankles are warm to touch bilaterally.  No increased warmth of the shoulders, elbows, or knees.  No hot joints appreciated.  Lymphadenopathy:       Head (right side): No submandibular adenopathy present.       Head (left side): No submandibular adenopathy present.    She has no cervical adenopathy.  Neurological: She is alert and oriented to person, place, and time. She has normal strength. No cranial nerve deficit or sensory deficit.  Skin: Skin is warm and dry.  Psychiatric: She has a normal mood and affect. Her speech is normal.  Nursing note and vitals reviewed.    ED Treatments / Results   Labs (all labs ordered are listed, but only abnormal results are displayed) Labs Reviewed - No data to display  EKG  EKG Interpretation None  Radiology No results found.  Procedures Procedures (including critical care time)  Medications Ordered in ED Medications  dexamethasone (DECADRON) injection 10 mg (not administered)  indomethacin (INDOCIN) capsule 25 mg (not administered)  ondansetron (ZOFRAN) tablet 4 mg (not administered)  HYDROcodone-acetaminophen (NORCO/VICODIN) 5-325 MG per tablet 2 tablet (not administered)  ipratropium-albuterol (DUONEB) 0.5-2.5 (3) MG/3ML nebulizer solution 3 mL (not administered)     Initial Impression / Assessment and Plan / ED Course  I have reviewed the triage vital signs and the nursing notes.  Pertinent labs & imaging results that were available during my care of the patient were reviewed by me and considered in my medical decision making (see chart for details).       Final Clinical Impressions(s) / ED Diagnoses MDM Vital signs reviewed.  No gross neurovascular deficits noted of the lower extremities. Examination is consistent with primary osteoarthritis of both ankles.  The patient also has an exacerbation of her asthma.  Patient treated with anti-inflammatory pain medication narcotic pain medication.  Patient also given a nebulizer treatment to assist with the asthma exacerbation.  Recheck.  Patient states she feels much better and ankles nearly as painful as when she came to the emergency room.  Patient currently has anti-inflammatory medication.  We will add hydrocodone 10 tablets.  Patient is to follow up with Dr. Juanetta Gosling for any additional pain management and for evaluation.   Final diagnoses:  Primary osteoarthritis of both ankles  Exacerbation of asthma, unspecified asthma severity, unspecified whether persistent    ED Discharge Orders        Ordered    HYDROcodone-acetaminophen (NORCO/VICODIN) 5-325 MG tablet   Every 4 hours PRN     12/21/17 1059       Ivery Quale, PA-C 12/21/17 2102    Loren Racer, MD 12/22/17 1356

## 2017-12-21 NOTE — Discharge Instructions (Signed)
Please continue your steroid medication and your Celebrex medication.  May use Norco every 4 hours for pain.  Please see Dr. Juanetta GoslingHawkins this week to map out a plan for pain control.

## 2017-12-21 NOTE — ED Triage Notes (Signed)
Pt reports is on a steroid dose pack and says her ankles are hurting.  Pt says she had a similar reaction in her knees once before when she was on prednisone.

## 2017-12-22 ENCOUNTER — Ambulatory Visit: Payer: 59

## 2017-12-23 DIAGNOSIS — K210 Gastro-esophageal reflux disease with esophagitis: Secondary | ICD-10-CM | POA: Diagnosis not present

## 2017-12-23 DIAGNOSIS — J301 Allergic rhinitis due to pollen: Secondary | ICD-10-CM | POA: Diagnosis not present

## 2017-12-23 DIAGNOSIS — J4551 Severe persistent asthma with (acute) exacerbation: Secondary | ICD-10-CM | POA: Diagnosis not present

## 2017-12-26 ENCOUNTER — Encounter: Payer: Self-pay | Admitting: Allergy & Immunology

## 2018-01-05 DIAGNOSIS — M1990 Unspecified osteoarthritis, unspecified site: Secondary | ICD-10-CM | POA: Diagnosis not present

## 2018-01-05 DIAGNOSIS — M7989 Other specified soft tissue disorders: Secondary | ICD-10-CM | POA: Diagnosis not present

## 2018-01-05 DIAGNOSIS — M79643 Pain in unspecified hand: Secondary | ICD-10-CM | POA: Diagnosis not present

## 2018-01-06 DIAGNOSIS — J209 Acute bronchitis, unspecified: Secondary | ICD-10-CM | POA: Diagnosis not present

## 2018-01-06 DIAGNOSIS — J4551 Severe persistent asthma with (acute) exacerbation: Secondary | ICD-10-CM | POA: Diagnosis not present

## 2018-01-06 DIAGNOSIS — J301 Allergic rhinitis due to pollen: Secondary | ICD-10-CM | POA: Diagnosis not present

## 2018-01-14 ENCOUNTER — Telehealth: Payer: Self-pay | Admitting: *Deleted

## 2018-01-14 NOTE — Telephone Encounter (Signed)
Faxed return to work form from [employer] to [phone].

## 2018-01-19 ENCOUNTER — Ambulatory Visit (INDEPENDENT_AMBULATORY_CARE_PROVIDER_SITE_OTHER): Payer: 59 | Admitting: *Deleted

## 2018-01-19 DIAGNOSIS — J309 Allergic rhinitis, unspecified: Secondary | ICD-10-CM | POA: Diagnosis not present

## 2018-01-20 DIAGNOSIS — J301 Allergic rhinitis due to pollen: Secondary | ICD-10-CM | POA: Diagnosis not present

## 2018-01-20 DIAGNOSIS — J4551 Severe persistent asthma with (acute) exacerbation: Secondary | ICD-10-CM | POA: Diagnosis not present

## 2018-01-20 DIAGNOSIS — M1990 Unspecified osteoarthritis, unspecified site: Secondary | ICD-10-CM | POA: Diagnosis not present

## 2018-01-20 NOTE — Progress Notes (Signed)
Immunotherapy   Patient Details  Name: Jodi [name] MRN: [id number] Date of Birth: 03/10/1976  01/19/2018  Jodi [name] : Patient started allergy injections.  Grass-Cat-Dog-DM and Mold-RW.  Blue Vial, 1:100,000 and received 0.05 ml of each vial.  Following schedule: B  Frequency: 1-2 times a week. Epi-Pen: Patient does have Epi-Pen and has been instructed on proper use. Consent signed and patient instructions given.  Patient waited 30 minutes after injection and no issues.    Jodi [name] 01/20/2018, 10:41 AM

## 2018-02-04 DIAGNOSIS — J301 Allergic rhinitis due to pollen: Secondary | ICD-10-CM | POA: Diagnosis not present

## 2018-02-04 DIAGNOSIS — R079 Chest pain, unspecified: Secondary | ICD-10-CM | POA: Diagnosis not present

## 2018-02-04 DIAGNOSIS — M2550 Pain in unspecified joint: Secondary | ICD-10-CM | POA: Diagnosis not present

## 2018-02-04 DIAGNOSIS — J4551 Severe persistent asthma with (acute) exacerbation: Secondary | ICD-10-CM | POA: Diagnosis not present

## 2018-02-09 ENCOUNTER — Ambulatory Visit (INDEPENDENT_AMBULATORY_CARE_PROVIDER_SITE_OTHER): Payer: 59

## 2018-02-09 DIAGNOSIS — J309 Allergic rhinitis, unspecified: Secondary | ICD-10-CM | POA: Diagnosis not present

## 2018-02-18 ENCOUNTER — Ambulatory Visit: Payer: 59 | Admitting: Allergy & Immunology

## 2018-02-18 DIAGNOSIS — J4551 Severe persistent asthma with (acute) exacerbation: Secondary | ICD-10-CM | POA: Diagnosis not present

## 2018-02-18 DIAGNOSIS — M2550 Pain in unspecified joint: Secondary | ICD-10-CM | POA: Diagnosis not present

## 2018-02-18 DIAGNOSIS — J301 Allergic rhinitis due to pollen: Secondary | ICD-10-CM | POA: Diagnosis not present

## 2018-02-23 ENCOUNTER — Ambulatory Visit (INDEPENDENT_AMBULATORY_CARE_PROVIDER_SITE_OTHER): Payer: 59 | Admitting: *Deleted

## 2018-02-23 DIAGNOSIS — J309 Allergic rhinitis, unspecified: Secondary | ICD-10-CM

## 2018-02-24 ENCOUNTER — Ambulatory Visit: Payer: 59 | Admitting: Allergy & Immunology

## 2018-03-01 ENCOUNTER — Ambulatory Visit: Payer: 59 | Admitting: Allergy & Immunology

## 2018-03-03 DIAGNOSIS — J301 Allergic rhinitis due to pollen: Secondary | ICD-10-CM | POA: Diagnosis not present

## 2018-03-03 DIAGNOSIS — J4551 Severe persistent asthma with (acute) exacerbation: Secondary | ICD-10-CM | POA: Diagnosis not present

## 2018-03-03 DIAGNOSIS — M1990 Unspecified osteoarthritis, unspecified site: Secondary | ICD-10-CM | POA: Diagnosis not present

## 2018-03-09 ENCOUNTER — Ambulatory Visit (INDEPENDENT_AMBULATORY_CARE_PROVIDER_SITE_OTHER): Payer: 59 | Admitting: *Deleted

## 2018-03-09 DIAGNOSIS — J309 Allergic rhinitis, unspecified: Secondary | ICD-10-CM | POA: Diagnosis not present

## 2018-03-11 ENCOUNTER — Encounter: Payer: Self-pay | Admitting: Allergy & Immunology

## 2018-03-11 ENCOUNTER — Ambulatory Visit: Payer: 59 | Admitting: Allergy & Immunology

## 2018-03-11 VITALS — BP 136/82 | HR 110 | Temp 98.0°F | Resp 22

## 2018-03-11 DIAGNOSIS — J302 Other seasonal allergic rhinitis: Secondary | ICD-10-CM | POA: Diagnosis not present

## 2018-03-11 DIAGNOSIS — J4540 Moderate persistent asthma, uncomplicated: Secondary | ICD-10-CM

## 2018-03-11 DIAGNOSIS — K219 Gastro-esophageal reflux disease without esophagitis: Secondary | ICD-10-CM | POA: Diagnosis not present

## 2018-03-11 DIAGNOSIS — J3089 Other allergic rhinitis: Secondary | ICD-10-CM

## 2018-03-11 MED ORDER — BUDESONIDE-FORMOTEROL FUMARATE 160-4.5 MCG/ACT IN AERO: 2.0000 | INHALATION_SPRAY | Freq: Two times a day (BID) | RESPIRATORY_TRACT | 5 refills | Status: DC

## 2018-03-11 MED ORDER — FLUTICASONE PROPIONATE 50 MCG/ACT NA SUSP: 2.0000 | Freq: Every day | NASAL | 5 refills | Status: DC

## 2018-03-11 NOTE — Patient Instructions (Addendum)
1. Moderate persistent asthma, uncomplicated - Lung testing actually looked quite good today.  - We will change the Dulera to Symbicort since there is a $0 copay card.  - Daily controller medication(s): Singulair  daily and Symbicort 160/4.40mcg two puffs twice daily with spacer - Prior to physical activity: ProAir 2 puffs 10-15 minutes before physical activity. - Rescue medications: ProAir 4 puffs every 4-6 hours as needed - Asthma control goals:  * Full participation in all desired activities (may need albuterol before activity) * Albuterol use two time or less a week on average (not counting use with activity) * Cough interfering with sleep two time or less a month * Oral steroids no more than once a year * No hospitalizations  2. Chronic rhinitis (weeds, grasses, indoor molds, outdoor molds, dust mites, cat and dog) - Continue with: Xyzal (levocetirizine)  tablet once daily, Singulair (montelukast)  daily and Flonase (fluticasone) two sprays per nostril daily  - Stop the Dymista for now since the taste is so unbearable. - You can use an extra dose of the antihistamine, if needed, for breakthrough symptoms.  - Consider nasal saline rinses 1-2 times daily to remove allergens from the nasal cavities as well as help with mucous clearance (this is especially helpful to do before the nasal sprays are given) - Continue with allergy shots at the same schedule.  . 3. Reflux - Continue with Prilosec daily.  4. Return in about 6 months (around 09/11/2018).   Please inform us of any Emergency Department visits, hospitalizations, or changes in symptoms. Call us before going to the ED for breathing or allergy symptoms since we might be able to fit you in for a sick visit. Feel free to contact us anytime with any questions, problems, or concerns.  It was a pleasure to see you again today!  Websites that have reliable patient information: 1. American Academy of Asthma, Allergy, and  Immunology: www.aaaai.org 2. Food Allergy Research and Education (FARE): foodallergy.org 3. Mothers of Asthmatics: http://www.asthmacommunitynetwork.org 4. American College of Allergy, Asthma, and Immunology: MissingWeapons.ca   Make sure you are registered to vote!

## 2018-03-11 NOTE — Progress Notes (Signed)
FOLLOW UP  Date of Service/Encounter:  03/11/18   Assessment:   Moderate persistent asthma - likely with VCD component  Recent viral infection - resolving  Gastroesophageal reflux disease - on PPI  Seasonal and perennial allergic rhinitis (weeds, grasses, indoor molds, outdoor molds, dust mites, cat and dog)   Asthma Reportables:  Severity: moderate persistent  Risk: high due to multiple steroid courses (5-8 per year, per patient) Control: well controlled   Plan/Recommendations:   1. Moderate persistent asthma, uncomplicated - Lung testing actually looked quite good today.  - She continues to do fairly well with the use of an ICS/LABA, but I continue to suspect that there is a component of VCD in her history. - The controller medication has certainly decreased her need for systemic steroids, but in the future if there is an abrupt worsening of her control, I would likely recommend a speech therapy evaluation before increasing her controller medications.  - We will change the Dulera to Symbicort since there is a $0 copay card.  - Daily controller medication(s): Singulair  daily and Symbicort 160/4.22mcg two puffs twice daily with spacer - Prior to physical activity: ProAir 2 puffs 10-15 minutes before physical activity. - Rescue medications: ProAir 4 puffs every 4-6 hours as needed - Asthma control goals:  * Full participation in all desired activities (may need albuterol before activity) * Albuterol use two time or less a week on average (not counting use with activity) * Cough interfering with sleep two time or less a month * Oral steroids no more than once a year * No hospitalizations  2. Chronic rhinitis (weeds, grasses, indoor molds, outdoor molds, dust mites, cat and dog) - Continue with: Xyzal (levocetirizine)  tablet once daily, Singulair (montelukast)  daily and Flonase (fluticasone) two sprays per nostril daily  - Stop the Dymista for now since the  taste is so unbearable. - You can use an extra dose of the antihistamine, if needed, for breakthrough symptoms.  - Consider nasal saline rinses 1-2 times daily to remove allergens from the nasal cavities as well as help with mucous clearance (this is especially helpful to do before the nasal sprays are given) - Continue with allergy shots at the same schedule.  . 3. Reflux - Continue with Prilosec daily.  4. Return in about 6 months (around 09/11/2018).  Subjective:   Jodi [name] is a 42 y.o. female presenting today for follow up of  Chief Complaint  Patient presents with  . Follow-up    sick 2 weeks ago   . Nasal Congestion    Jodi [name] has a history of the following: Patient Active Problem List   Diagnosis Date Noted  . Moderate persistent asthma, uncomplicated 09/24/2017  . Seasonal and perennial allergic rhinitis 09/24/2017  . SI joint arthritis 02/21/2016  . SI (sacroiliac) joint dysfunction 09/04/2015  . Fall from horse 07/30/2015  . Lumbar transverse process fracture (HCC) 07/30/2015  . Pelvic ring fracture (HCC) 07/28/2015  . Primary generalized (osteo)arthritis 03/13/2015  . Asthma 11/17/2014  . Gastroesophageal reflux disease 11/17/2014  . Vitamin D deficiency 11/04/2014  . Dyslipidemia 08/11/2012  . 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 Allergic rhinitis 02/19/2010    History obtained from: chart review and patient.  Jodi [name]'s Primary Care Provider is Jodi Baars, MD.     [name] is a 42 y.o. female presenting for a follow up visit. She was last seen in February 2018. At that time, her lung function looked fairly  good. She was endorsing some additional symptoms despite therapy with Dulera, therefore we added on Spiriva (sample only) to see if this would help at all. Her allergic rhinitis was partially controlled with the use of medications, and she made the decision to go ahead with allergy injections. She has since started those. Reflux  was controlled with the use of a PPI.   Since the last visit, she has mostly done well. She did get the "crud" from her son and developed sinus drainage and chest conegestion. Hers did not last as long as her son's symptoms, and she did not require the use of an antibiotic or steroids. She remains on the [facility] two puffs BID, but does complain about the cost today (~$50 per month). Review of the chart shows that she came to me on [facility] and I therefore did not change it, but the first note says that she has been on Advair and Symbicort in the past. However, she does remember the Symbicort working and is unclear why it was ever changed. She is open to changing her controller medications.  Otherwise, Sharline's asthma has been well controlled. She has not required rescue medication, experienced nocturnal awakenings due to lower respiratory symptoms, nor have activities of daily living been limited. She has required no Emergency Department or Urgent Care visits for her asthma. She has required four courses of systemic steroids for asthma exacerbations since the last visit. ACT score today is 17, indicating excellent asthma symptom control.    [name] is on allergen immunotherapy. She receives two injections. Immunotherapy script #1 contains grasses, dust mites, cat and dog. She currently receives 0.76mL of the BLUE vial (1/100,000). Immunotherapy script #2 contains ragweed and molds. She currently receives 0.33mL of the BLUE vial (1/100,000). She started shots April of 2019 and not yet reached maintenance.   She has had several life stressors over the last month. Her horse had some issues including problems with blood vessels and required surgery. They kept her all day on Monday. They have four horses and then two additonal ones. Her dog also had a c/s and had one puppy. And as mentioned she was sick with a cold during this time.   Otherwise, there have been no changes to her past medical history, surgical  history, family history, or social history.    Review of Systems: a 14-point review of systems is pertinent for what is mentioned in HPI.  Otherwise, all other systems were negative. Constitutional: negative other than that listed in the HPI Eyes: negative other than that listed in the HPI Ears, nose, mouth, throat, and face: negative other than that listed in the HPI Respiratory: negative other than that listed in the HPI Cardiovascular: negative other than that listed in the HPI Gastrointestinal: negative other than that listed in the HPI Genitourinary: negative other than that listed in the HPI Integument: negative other than that listed in the HPI Hematologic: negative other than that listed in the HPI Musculoskeletal: negative other than that listed in the HPI Neurological: negative other than that listed in the HPI Allergy/Immunologic: negative other than that listed in the HPI    Objective:   Blood pressure 136/82, pulse (!) 110, temperature 98 F (36.7 C), temperature source Oral, resp. rate (!) 22, last menstrual period 05/17/2012, SpO2 95 %. There is no height or weight on file to calculate BMI.   Physical Exam:  General: Alert, interactive, in no acute distress. Smiling and pleasant.  Eyes: No conjunctival injection bilaterally, no  discharge on the right, no discharge on the left and no Horner-Trantas dots present. PERRL bilaterally. EOMI without pain. No photophobia.  Ears: Right TM pearly gray with normal light reflex, Left TM pearly gray with normal light reflex, Right TM intact without perforation and Left TM intact without perforation.  Nose/Throat: External nose within normal limits and septum midline. Turbinates edematous without discharge. Posterior oropharynx mildly erythematous without cobblestoning in the posterior oropharynx. Tonsils 2+ without exudates.  Tongue without thrush. Lungs: Clear to auscultation without wheezing, rhonchi or rales. No increased work  of breathing. CV: Normal S1/S2. No murmurs. Capillary refill <2 seconds.  Skin: Warm and dry, without lesions or rashes. Neuro:   Grossly intact. No focal deficits appreciated. Responsive to questions.  Diagnostic studies:   Spirometry: results normal (FEV1: 2.61/102%, FVC: 3.13/100%, FEV1/FVC: 83%).    Spirometry consistent with normal pattern.   Allergy Studies: none    Malachi Bonds, MD  [facility]

## 2018-03-12 ENCOUNTER — Encounter: Payer: Self-pay | Admitting: Allergy & Immunology

## 2018-03-15 ENCOUNTER — Other Ambulatory Visit (HOSPITAL_COMMUNITY): Payer: Self-pay | Admitting: Unknown Physician Specialty

## 2018-03-15 DIAGNOSIS — Z1231 Encounter for screening mammogram for malignant neoplasm of breast: Secondary | ICD-10-CM

## 2018-03-17 DIAGNOSIS — J4551 Severe persistent asthma with (acute) exacerbation: Secondary | ICD-10-CM | POA: Diagnosis not present

## 2018-03-17 DIAGNOSIS — J301 Allergic rhinitis due to pollen: Secondary | ICD-10-CM | POA: Diagnosis not present

## 2018-03-17 DIAGNOSIS — M1990 Unspecified osteoarthritis, unspecified site: Secondary | ICD-10-CM | POA: Diagnosis not present

## 2018-03-23 ENCOUNTER — Ambulatory Visit (INDEPENDENT_AMBULATORY_CARE_PROVIDER_SITE_OTHER): Payer: 59

## 2018-03-23 DIAGNOSIS — J309 Allergic rhinitis, unspecified: Secondary | ICD-10-CM | POA: Diagnosis not present

## 2018-03-27 ENCOUNTER — Emergency Department (HOSPITAL_COMMUNITY)
Admission: EM | Admit: 2018-03-27 | Discharge: 2018-03-27 | Disposition: A | Discharge status: Home / Self Care | Payer: 59 | Attending: Emergency Medicine | Admitting: Emergency Medicine

## 2018-03-27 ENCOUNTER — Encounter (HOSPITAL_COMMUNITY): Payer: Self-pay

## 2018-03-27 ENCOUNTER — Other Ambulatory Visit: Payer: Self-pay

## 2018-03-27 DIAGNOSIS — M549 Dorsalgia, unspecified: Secondary | ICD-10-CM | POA: Diagnosis present

## 2018-03-27 DIAGNOSIS — Z87891 Personal history of nicotine dependence: Secondary | ICD-10-CM | POA: Insufficient documentation

## 2018-03-27 DIAGNOSIS — J45909 Unspecified asthma, uncomplicated: Secondary | ICD-10-CM | POA: Insufficient documentation

## 2018-03-27 DIAGNOSIS — Z8781 Personal history of (healed) traumatic fracture: Secondary | ICD-10-CM | POA: Diagnosis not present

## 2018-03-27 DIAGNOSIS — Z79899 Other long term (current) drug therapy: Secondary | ICD-10-CM | POA: Insufficient documentation

## 2018-03-27 DIAGNOSIS — M62830 Muscle spasm of back: Secondary | ICD-10-CM | POA: Diagnosis not present

## 2018-03-27 DIAGNOSIS — M545 Low back pain: Secondary | ICD-10-CM | POA: Diagnosis not present

## 2018-03-27 MED ORDER — KETOROLAC TROMETHAMINE 60 MG/2ML IM SOLN
60.0000 mg | Freq: Once | INTRAMUSCULAR | Status: AC
  Administered 2018-03-27: 60 mg via INTRAMUSCULAR
  Filled 2018-03-27: qty 2

## 2018-03-27 MED ORDER — MELOXICAM 15 MG PO TABS: 15.0000 mg | ORAL_TABLET | Freq: Every day | ORAL | 0 refills | Status: DC

## 2018-03-27 MED ORDER — CYCLOBENZAPRINE HCL 10 MG PO TABS: 10.0000 mg | ORAL_TABLET | Freq: Three times a day (TID) | ORAL | 0 refills | Status: DC

## 2018-03-27 MED ORDER — HYDROCODONE-ACETAMINOPHEN 5-325 MG PO TABS
2.0000 | ORAL_TABLET | Freq: Once | ORAL | Status: AC
  Administered 2018-03-27: 2 via ORAL
  Filled 2018-03-27: qty 2

## 2018-03-27 MED ORDER — HYDROCODONE-ACETAMINOPHEN 5-325 MG PO TABS: 1.0000 | ORAL_TABLET | ORAL | 0 refills | Status: DC | PRN

## 2018-03-27 MED ORDER — DIAZEPAM 5 MG PO TABS
10.0000 mg | ORAL_TABLET | Freq: Once | ORAL | Status: AC
  Administered 2018-03-27: 10 mg via ORAL
  Filled 2018-03-27: qty 2

## 2018-03-27 MED ORDER — ONDANSETRON HCL 4 MG PO TABS
4.0000 mg | ORAL_TABLET | Freq: Once | ORAL | Status: AC
Start: 2018-03-27 — End: 2018-03-27
  Administered 2018-03-27: 4 mg via ORAL
  Filled 2018-03-27: qty 1

## 2018-03-27 NOTE — ED Provider Notes (Signed)
[facility] EMERGENCY DEPARTMENT Provider Note   CSN: [id number] Arrival date & time: 03/27/18  1223     History   Chief Complaint Chief Complaint  Patient presents with  . Back Pain    HPI Jodi [name] is a 42 y.o. female.  Patient is a 42 year old female who presents to the emergency department with a complaint of back pain.  The patient states that she has a history of severe back and pelvis injury.  She has plates and screws both in her back and in her pelvis.  She has been told not to use cowboy boots, but she continues to use cowboy boots when she is feeding and when she is riding her horses.  She recently used her cowboy boots and had pain.  She says that usually she has pain that will respond to rest and ibuprofen.  This time the pain has been hanging around for couple of days and seems to be getting worse instead of getting better.  The patient has not had any loss of bowel or bladder function.  She has not had any unusual numbness in the saddle area.  She presents now for assistance with this pain and evaluation.   Back Pain   Pertinent negatives include no chest pain, no abdominal pain and no dysuria.    Past Medical History:  Diagnosis Date  . Abdominal pain, chronic, right upper quadrant   . Acute recurrent maxillary sinusitis 11/04/2014  . Allergic rhinitis   . Anemia   . Angio-edema   . Arthritis    knee  . Asthma    Cough variant  . Asthma   . Asthma   . Back pain   . Chronic pain syndrome 06/08/2013  . DVT (deep venous thrombosis) (HCC) 10 yrs ago  . Dyslipidemia 08/11/2012  . Eczema   . GERD (gastroesophageal reflux disease)   . Heart murmur    as a child  . Hyperlipidemia   . IBS (irritable bowel syndrome)   . 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 Pneumonia   . Shortness of breath    SOB even without exertion at times  . Sickle-cell trait [facility])     Patient Active Problem List   Diagnosis Date Noted  . Moderate  persistent asthma, uncomplicated 09/24/2017  . Seasonal and perennial allergic rhinitis 09/24/2017  . SI joint arthritis 02/21/2016  . SI (sacroiliac) joint dysfunction 09/04/2015  . Fall from horse 07/30/2015  . Lumbar transverse process fracture (HCC) 07/30/2015  . Pelvic ring fracture (HCC) 07/28/2015  . Primary generalized (osteo)arthritis 03/13/2015  . Asthma 11/17/2014  . Gastroesophageal reflux disease 11/17/2014  . Vitamin D deficiency 11/04/2014  . Dyslipidemia 08/11/2012  . 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 Allergic rhinitis 02/19/2010    Past Surgical History:  Procedure Laterality Date  . ABDOMINAL HYSTERECTOMY  4-5 yrs ago  . ABDOMINAL HYSTERECTOMY    . blood clot removed from lower abdomen  2000  . CHOLECYSTECTOMY  11/12/2012   Procedure: LAPAROSCOPIC CHOLECYSTECTOMY;  Surgeon: Dalia Heading, MD;  Location: [facility] ORS;  Service: General;  Laterality: N/A;  . CHOLECYSTECTOMY    . COLONOSCOPY  11/04/2012   Procedure: COLONOSCOPY;  Surgeon: Malissa Hippo, MD;  Location: [facility] ENDO SUITE;  Service: Endoscopy;  Laterality: N/A;  915  . cyst removed from ovary and fluid removed from tubes  2000  . ESOPHAGOGASTRODUODENOSCOPY  10/22/2012   Procedure: ESOPHAGOGASTRODUODENOSCOPY (EGD);  Surgeon: Malissa Hippo, MD;  Location: [facility] ENDO SUITE;  Service: Endoscopy;  Laterality: N/A;  100  . FRACTURE SURGERY    . HARDWARE REMOVAL Left 02/21/2016   Procedure: HARDWARE REMOVAL LEFT SACROILIAC JOINT;  Surgeon: Myrene Galas, MD;  Location: [facility] OR;  Service: Orthopedics;  Laterality: Left;  . ORIF PELVIC FRACTURE N/A 07/30/2015   Procedure: OPEN REDUCTION INTERNAL FIXATION (ORIF) PELVIC FRACTURE;  Surgeon: Myrene Galas, MD;  Location: [facility] OR;  Service: Orthopedics;  Laterality: N/A;  . PARTIAL HYSTERECTOMY  2009  . SACRO-ILIAC PINNING Right 07/30/2015   Procedure: Loyal Gambler;  Surgeon: Myrene Galas, MD;  Location: [facility] OR;  Service: Orthopedics;  Laterality: Right;  . SACRO-ILIAC  PINNING Left 09/04/2015   Procedure: LEFT TO RIGHT TRANS SACRO-ILIAC SCREW;  Surgeon: Myrene Galas, MD;  Location: [facility] OR;  Service: Orthopedics;  Laterality: Left;  . SACROILIAC JOINT FUSION Left 02/21/2016   Procedure: LEFT SACROILIAC JOINT FUSION;  Surgeon: Myrene Galas, MD;  Location: [facility] OR;  Service: Orthopedics;  Laterality: Left;  . TUBAL LIGATION  1999     OB History    Gravida  0   Para  0   Term  0   Preterm  0   AB  0   Living        SAB  0   TAB  0   Ectopic  0   Multiple      Live Births               Home Medications    Prior to Admission medications   Medication Sig Start Date End Date Taking? Authorizing Provider  albuterol (PROVENTIL HFA;VENTOLIN HFA) 108 (90 BASE) MCG/ACT inhaler Inhale 2 puffs into the lungs every 6 (six) hours as needed for wheezing or shortness of breath. 05/08/15   Kerri Perches, MD  Azelastine-Fluticasone 137-50 MCG/ACT SUSP Place 2 sprays into both nostrils See admin instructions. 1-2 times daily. 09/24/17   Alfonse Spruce, MD  budesonide-formoterol [facility]) 160-4.5 MCG/ACT inhaler Inhale 2 puffs into the lungs 2 (two) times daily. 03/11/18   Alfonse Spruce, MD  celecoxib (CELEBREX) 100 MG capsule Take 100 mg by mouth daily.    [provider]  desipramine (NORPRAMIN) 50 MG tablet Take 50 mg by mouth at bedtime.    [provider]  fluticasone (FLONASE) 50 MCG/ACT nasal spray Place 2 sprays into both nostrils daily. 03/11/18 04/10/18  Alfonse Spruce, MD  HYDROcodone-acetaminophen (NORCO/VICODIN) 5-325 MG tablet Take 1 tablet by mouth every 4 (four) hours as needed. Patient not taking: Reported on 03/11/2018 12/21/17   Ivery Quale, PA-C  ipratropium-albuterol (DUONEB) 0.5-2.5 (3) MG/3ML SOLN Take 3 mLs by nebulization every 6 (six) hours as needed (shortness of breath).     [provider]  levocetirizine (XYZAL) 5 MG tablet TAKE ONE TABLET BY MOUTH ONCE DAILY IN THE EVENING  04/28/16   Kerri Perches, MD  mometasone-formoterol [facility]) 200-5 MCG/ACT AERO Inhale 2 puffs into the lungs 2 (two) times daily. With spacer 09/24/17   Alfonse Spruce, MD  montelukast (SINGULAIR) 10 MG tablet TAKE ONE TABLET BY MOUTH IN THE MORNING 04/28/16   Kerri Perches, MD  naproxen sodium (ANAPROX) 220 MG tablet Take 440 mg by mouth 2 (two) times daily with a meal.    [provider]  omeprazole (PRILOSEC) 20 MG capsule Take 1 capsule (20 mg total) by mouth daily. 01/05/14   Setzer, Brand Males, NP  predniSONE (DELTASONE) 10 MG tablet Take 10 mg by  mouth daily with breakfast.    [provider]  Tiotropium Bromide Monohydrate (SPIRIVA RESPIMAT) 1.25 MCG/ACT AERS Inhale 2 puffs into the lungs daily. 12/10/17   Alfonse SpruceGallagher, Joel Louis, MD    Family History Family History  Problem Relation Age of Onset  . Asthma Mother   . Hypertension Mother   . Bronchiolitis Mother   . Hypertension Father   . Hyperlipidemia Father        recurrent rectum polyps   . Allergic rhinitis Father   . Diabetes Sister   . Lupus Sister   . Depression Sister     Social History Social History   Tobacco Use  . Smoking status: Former Smoker    Packs/day: 0.25    Years: 10.00    Pack years: 2.50    Types: Cigarettes    Last attempt to quit: 10/13/2008    Years since quitting: 9.4  . Smokeless tobacco: Never Used  Substance Use Topics  . Alcohol use: No  . Drug use: No     Allergies   Compazine [prochlorperazine]   Review of Systems Review of Systems  Constitutional: Negative for activity change.       All ROS Neg except as noted in HPI  HENT: Negative for nosebleeds.   Eyes: Negative for photophobia and discharge.  Respiratory: Negative for cough, shortness of breath and wheezing.   Cardiovascular: Negative for chest pain and palpitations.  Gastrointestinal: Negative for abdominal pain and blood in stool.  Genitourinary: Negative for dysuria, frequency and  hematuria.  Musculoskeletal: Positive for back pain. Negative for arthralgias and neck pain.  Skin: Negative.   Neurological: Negative for dizziness, seizures and speech difficulty.  Psychiatric/Behavioral: Negative for confusion and hallucinations.     Physical Exam Updated Vital Signs BP (!) 156/99 (BP Location: Right Arm)   Pulse (!) 105   Temp 98.4 F (36.9 C) (Oral)   Resp 14   Ht 5\' 6"  (1.676 m)   Wt 98.4 kg (217 lb)   LMP 05/17/2012   SpO2 97%   BMI 35.02 kg/m   Physical Exam  Constitutional: She is oriented to person, place, and time. She appears well-developed and well-nourished.  Non-toxic appearance.  HENT:  Head: Normocephalic.  Right Ear: Tympanic membrane and external ear normal.  Left Ear: Tympanic membrane and external ear normal.  Eyes: Pupils are equal, round, and reactive to light. EOM and lids are normal.  Neck: Normal range of motion. Neck supple. Carotid bruit is not present.  Cardiovascular: Normal rate, regular rhythm, normal heart sounds, intact distal pulses and normal pulses.  Pulmonary/Chest: Breath sounds normal. No respiratory distress.  Abdominal: Soft. Bowel sounds are normal. There is no tenderness. There is no guarding.  Musculoskeletal: Normal range of motion.       Lumbar back: She exhibits pain and spasm.       Back:  Lymphadenopathy:       Head (right side): No submandibular adenopathy present.       Head (left side): No submandibular adenopathy present.    She has no cervical adenopathy.  Neurological: She is alert and oriented to person, place, and time. She has normal strength. No cranial nerve deficit or sensory deficit.  Skin: Skin is warm and dry.  Psychiatric: She has a normal mood and affect. Her speech is normal.  Nursing note and vitals reviewed.    ED Treatments / Results  Labs (all labs ordered are listed, but only abnormal results are displayed) Labs Reviewed -  No data to display  EKG None  Radiology No  results found.  Procedures Procedures (including critical care time)  Medications Ordered in ED Medications  ketorolac (TORADOL) injection 60 mg (60 mg Intramuscular Given 03/27/18 1440)  HYDROcodone-acetaminophen (NORCO/VICODIN) 5-325 MG per tablet 2 tablet (2 tablets Oral Given 03/27/18 1441)  diazepam (VALIUM) tablet 10 mg (10 mg Oral Given 03/27/18 1442)  ondansetron (ZOFRAN) tablet 4 mg (4 mg Oral Given 03/27/18 1441)     Initial Impression / Assessment and Plan / ED Course  I have reviewed the triage vital signs and the nursing notes.  Pertinent labs & imaging results that were available during my care of the patient were reviewed by me and considered in my medical decision making (see chart for details).      Final Clinical Impressions(s) / ED Diagnoses MDm  Patient has a previous history of pelvic fracture, requiring multiple plates and screws.  She continues to ride horses, and also use cowboy boots in spite of the fact that she has been told not to wear cowboy boots.  The examination is negative for any acute changes.  No recent falls or direct trauma.  Patient will be treated with muscle relaxer, anti-inflammatory pain medication, and 10 tablets of hydrocodone.  Patient is strongly encouraged to see her primary orthopedic specialist as soon as possible.  Patient is in agreement with this plan.  The patient was ambulatory at discharge without problem.   Final diagnoses:  Muscle spasm of back  History of pelvic fracture    ED Discharge Orders        Ordered    cyclobenzaprine (FLEXERIL) 10 MG tablet  3 times daily     03/27/18 1522    meloxicam (MOBIC) 15 MG tablet  Daily     03/27/18 1522    HYDROcodone-acetaminophen (NORCO/VICODIN) 5-325 MG tablet  Every 4 hours PRN     03/27/18 1522       Ivery Quale, PA-C 03/27/18 1613    Donnetta Hutching, MD 03/28/18 1306

## 2018-03-27 NOTE — Discharge Instructions (Addendum)
Please use a heating pad to your back and pelvis area.  Please use Flexeril 3 times daily.  Use meloxicam daily with food.  Use norco for more severe pain.  Norco and Flexeril may cause drowsiness, please use these medications with caution.  Please see Dr. Juanetta GoslingHawkins for additional evaluation and pain management.

## 2018-03-27 NOTE — ED Triage Notes (Signed)
Pt has an old injury from a horse riding accident. Per PCP pt was told not to wear any cowboy boots due to the way they tilt her Pelvis. On Monday pt wore these boots while around horses and has been having progressively worsening lower back pain and spasms. Has taken Ibuprofen with no lasting relief.

## 2018-03-27 NOTE — ED Notes (Signed)
Pt reports ORIF of pelvis with fx of L spine vertebrae in past  Works at [employer], has had low back pain for several days- hard to get up and hard to stay up without pain  Also has been on prednisone for several months for her asthma  Denies incontinence

## 2018-03-29 DIAGNOSIS — J301 Allergic rhinitis due to pollen: Secondary | ICD-10-CM | POA: Diagnosis not present

## 2018-03-29 DIAGNOSIS — I10 Essential (primary) hypertension: Secondary | ICD-10-CM | POA: Diagnosis not present

## 2018-03-29 DIAGNOSIS — J4551 Severe persistent asthma with (acute) exacerbation: Secondary | ICD-10-CM | POA: Diagnosis not present

## 2018-04-01 ENCOUNTER — Ambulatory Visit (INDEPENDENT_AMBULATORY_CARE_PROVIDER_SITE_OTHER): Payer: 59

## 2018-04-01 ENCOUNTER — Ambulatory Visit: Payer: 59 | Admitting: Orthopaedic Surgery

## 2018-04-01 ENCOUNTER — Encounter: Payer: Self-pay | Admitting: Orthopaedic Surgery

## 2018-04-01 VITALS — BP 136/91 | HR 102 | Ht 65.0 in | Wt 222.0 lb

## 2018-04-01 DIAGNOSIS — G8929 Other chronic pain: Secondary | ICD-10-CM

## 2018-04-01 DIAGNOSIS — R102 Pelvic and perineal pain: Secondary | ICD-10-CM

## 2018-04-01 DIAGNOSIS — M545 Low back pain, unspecified: Secondary | ICD-10-CM

## 2018-04-01 NOTE — Progress Notes (Signed)
Subjective:    Patient ID: Jodi [name], female    DOB: [birth year], 42 y.o.   MRN: [id number]  HPI She had a horse rear up and threw her off the horse and then the horse fell on her on 07-28-15.  She had severe trauma to her pelvis and had transverse process fractures left lumbar 2, 3 and 4.  She also had an avulsion fracture of the right lateral malleolus.  She had surgery with iliosacral screw fixation of the right hemipelvis and ORIF of the anterior pubic symphysis by Dr. Carola Frost on 07-28-15.  She later returned to [facility] and had  Transsacral screw fixation of right and left S1 and also S2 by Dr. Carola Frost on 09-04-15. On 02-21-16 she had left S1 fusion and hardware exchange of the screws times two.  Post all these surgeries she has done fairly well she says.  She was told about not wearing heeled shoes and has been careful in wearing her cowboy boots.  She has resumed working with and riding horses.  She gets tired at times and has to rest.  Last week she started having lower back pain.  It stayed and did not go away which is unusual for her.  She has no paresthesias.  She tried rest, ice, heat but no help.  She is taking large doses of prednisone for a flare up of her asthma.  Her pain kept getting worse and worse. She went to the ER on 03-27-18.  She was evaluated there and given Flexeril and Mobic.  She also was given hydrocodone but she does not like to take that so she has not.  She has no relief.  She has pain after sitting a while and standing a while.  She is very concerned.  She has no weakness, no numbness.  She is still on her prednisone for her asthma and is taking now 10 mgm a day down from 40 mgm a day.    Review of Systems  Constitutional: Positive for activity change.  HENT: Positive for congestion.   Respiratory: Positive for cough, chest tightness and shortness of breath.   Endocrine: Positive for cold intolerance.  Musculoskeletal: Positive for arthralgias, back pain  and myalgias.  Allergic/Immunologic: Positive for environmental allergies.  All other systems reviewed and are negative.  Past Medical History:  Diagnosis Date  . Abdominal pain, chronic, right upper quadrant   . Acute recurrent maxillary sinusitis 11/04/2014  . Allergic rhinitis   . Anemia   . Angio-edema   . Arthritis    knee  . Asthma    Cough variant  . Asthma   . Asthma   . Back pain   . Chronic pain syndrome 06/08/2013  . DVT (deep venous thrombosis) (HCC) 10 yrs ago  . Dyslipidemia 08/11/2012  . Eczema   . Fractures   . GERD (gastroesophageal reflux disease)   . Heart murmur    as a child  . Hyperlipidemia   . IBS (irritable bowel syndrome)   . 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 Pneumonia   . Shortness of breath    SOB even without exertion at times  . Sickle-cell trait [facility])     Past Surgical History:  Procedure Laterality Date  . ABDOMINAL HYSTERECTOMY  4-5 yrs ago  . ABDOMINAL HYSTERECTOMY    . blood clot removed from lower abdomen  2000  . CHOLECYSTECTOMY  11/12/2012   Procedure: LAPAROSCOPIC CHOLECYSTECTOMY;  Surgeon: Loraine Leriche  Val RilesA Jenkins, MD;  Location: [facility] ORS;  Service: General;  Laterality: N/A;  . CHOLECYSTECTOMY    . COLONOSCOPY  11/04/2012   Procedure: COLONOSCOPY;  Surgeon: [name], MD;  Location[facility];  Service: Endoscopy;  Laterality: N/A;  915  . cyst removed from ovary and fluid removed from tubes  2000  . ESOPHAGOGASTRODUODENOSCOPY  10/22/2012   Procedure: ESOPHAGOGASTRODUODENOSCOPY (EGD);  Surgeon: [name], MD;  [facility];  Service: Endoscopy;  Laterality: N/A;  100  . FRACTURE SURGERY    . HARDWARE REMOVAL Left 02/21/2016   Procedure: HARDWARE REMOVAL LEFT SACROILIAC JOINT;  Surgeon: Myrene GalasMichael Handy, MD;  [facility];  Service: Orthopedics;  Laterality: Left;  . ORIF PELVIC FRACTURE N/A 07/30/2015   Procedure: OPEN REDUCTION INTERNAL FIXATION (ORIF) PELVIC FRACTURE;  Surgeon: Myrene GalasMichael Handy,  MD;  [facility];  Service: Orthopedics;  Laterality: N/A;  . PARTIAL HYSTERECTOMY  2009  . SACRO-ILIAC PINNING Right 07/30/2015   Procedure: Loyal GamblerSACRO-ILIAC PINNING;  Surgeon: Myrene GalasMichael Handy, MD;  [facility];  Service: Orthopedics;  Laterality: Right;  . SACRO-ILIAC PINNING Left 09/04/2015   Procedure: LEFT TO RIGHT TRANS SACRO-ILIAC SCREW;  Surgeon: Myrene GalasMichael Handy, MD;  [facility];  Service: Orthopedics;  Laterality: Left;  . SACROILIAC JOINT FUSION Left 02/21/2016   Procedure: LEFT SACROILIAC JOINT FUSION;  Surgeon: Myrene GalasMichael Handy, MD;  [facility];  Service: Orthopedics;  Laterality: Left;  . TUBAL LIGATION  1999    Current Outpatient Medications on File Prior to Visit  Medication Sig Dispense Refill  . albuterol (PROVENTIL HFA;VENTOLIN HFA) 108 (90 BASE) MCG/ACT inhaler Inhale 2 puffs into the lungs every 6 (six) hours as needed for wheezing or shortness of breath. 18 g 0  . Azelastine-Fluticasone 137-50 MCG/ACT SUSP Place 2 sprays into both nostrils See admin instructions. 1-2 times daily. 23 g 5  . budesonide-formoterol (SYMBICORT) 160-4.5 MCG/ACT inhaler Inhale 2 puffs into the lungs 2 (two) times daily. 1 Inhaler 5  . celecoxib (CELEBREX) 100 MG capsule Take 100 mg by mouth daily.    . cyclobenzaprine (FLEXERIL) 10 MG tablet Take 1 tablet (10 mg total) by mouth 3 (three) times daily. 20 tablet 0  . desipramine (NORPRAMIN) 50 MG tablet Take 50 mg by mouth at bedtime.    . fluticasone (FLONASE) 50 MCG/ACT nasal spray Place 2 sprays into both nostrils daily. 16 g 5  . HYDROcodone-acetaminophen (NORCO/VICODIN) 5-325 MG tablet Take 1 tablet by mouth every 4 (four) hours as needed. 10 tablet 0  . ipratropium-albuterol (DUONEB) 0.5-2.5 (3) MG/3ML SOLN Take 3 mLs by nebulization every 6 (six) hours as needed (shortness of breath).     Marland Kitchen. levocetirizine (XYZAL) 5 MG tablet TAKE ONE TABLET BY MOUTH ONCE DAILY IN THE EVENING 30 tablet 0  . meloxicam (MOBIC) 15 MG tablet Take 1 tablet  (15 mg total) by mouth daily. 6 tablet 0  . mometasone-formoterol (DULERA) 200-5 MCG/ACT AERO Inhale 2 puffs into the lungs 2 (two) times daily. With spacer 13 g 3  . montelukast (SINGULAIR) 10 MG tablet TAKE ONE TABLET BY MOUTH IN THE MORNING 30 tablet 0  . naproxen sodium (ANAPROX) 220 MG tablet Take 440 mg by mouth 2 (two) times daily with a meal.    . omeprazole (PRILOSEC) 20 MG capsule Take 1 capsule (20 mg total) by mouth daily. 30 capsule 5  . predniSONE (DELTASONE) 10 MG tablet Take 10 mg by mouth daily with breakfast.    . Tiotropium  Bromide Monohydrate (SPIRIVA RESPIMAT) 1.25 MCG/ACT AERS Inhale 2 puffs into the lungs daily. 4 g 5   No current facility-administered medications on file prior to visit.     Social History   Socioeconomic History  . Marital status: Single    Spouse name: Not on file  . Number of children: 3  . Years of education: Not on file  . Highest education level: Not on file  Occupational History  . Occupation: Employed at English as a second language teacher , Designer, jewellery: [employer]  Social Needs  . Financial resource strain: Not on file  . Food insecurity:    Worry: Not on file    Inability: Not on file  . Transportation needs:    Medical: Not on file    Non-medical: Not on file  Tobacco Use  . Smoking status: Former Smoker    Packs/day: 0.25    Years: 10.00    Pack years: 2.50    Types: Cigarettes    Last attempt to quit: 10/13/2008    Years since quitting: 9.4  . Smokeless tobacco: Never Used  Substance and Sexual Activity  . Alcohol use: No  . Drug use: No  . Sexual activity: Yes    Birth control/protection: Surgical  Lifestyle  . Physical activity:    Days per week: Not on file    Minutes per session: Not on file  . Stress: Not on file  Relationships  . Social connections:    Talks on phone: Not on file    Gets together: Not on file    Attends religious service: Not on file    Active member of club or organization: Not on file    Attends  meetings of clubs or organizations: Not on file    Relationship status: Not on file  . Intimate partner violence:    Fear of current or ex partner: Not on file    Emotionally abused: Not on file    Physically abused: Not on file    Forced sexual activity: Not on file  Other Topics Concern  . Not on file  Social History Narrative   ** Merged History Encounter **       ** Merged History Encounter **        Family History  Problem Relation Age of Onset  . Asthma Mother   . Hypertension Mother   . Bronchiolitis Mother   . Hypertension Father   . Hyperlipidemia Father        recurrent rectum polyps   . Allergic rhinitis Father   . Diabetes Sister   . Lupus Sister   . Depression Sister     BP (!) 136/91   Pulse (!) 102   Ht 5\' 5"  (1.651 m)   Wt 222 lb (100.7 kg)   LMP 05/17/2012   BMI 36.94 kg/m      Objective:   Physical Exam  Constitutional: She is oriented to person, place, and time. She appears well-developed and well-nourished.  HENT:  Head: Normocephalic and atraumatic.  Eyes: Pupils are equal, round, and reactive to light. Conjunctivae and EOM are normal.  Neck: Normal range of motion. Neck supple.  Cardiovascular: Normal rate, regular rhythm and intact distal pulses.  Pulmonary/Chest: Effort normal.  Abdominal: Soft.  Musculoskeletal:       Back:  Neurological: She is alert and oriented to person, place, and time. She has normal reflexes. She displays normal reflexes. No cranial nerve deficit. She exhibits normal muscle tone. Coordination  normal.  Skin: Skin is warm and dry.  Psychiatric: She has a normal mood and affect. Her behavior is normal. Judgment and thought content normal.    I have reviewed her old records and the recent ER visit and reports.  I have reviewed old x-rays and reports.  X-rays were done today of the lumbar spine and pelvis, reported separately.      Assessment & Plan:   Encounter Diagnoses  Name Primary?  . Chronic midline  low back pain without sciatica Yes  . Pelvic pain    She has no change in the position and alignment of the fixation of the pelvis.  She was concerned there was a change.  She has arthrodesis of SI joint on the left.  She is taking prednisone.  She is to continue the taper.  I told her to stop the Mobic.  Mobic is not recommended with the prednisone.  Continue the flexeril.  Begin PT.  She may need MRI if not improved.  Precautions given.  Return in one week.  Call if any problem.  Precautions discussed.   Electronically Signed Darreld Mclean, MD 6/20/20199:21 AM

## 2018-04-01 NOTE — Patient Instructions (Signed)
  Physical therapy has been ordered for you at [place] [phone] is the phone number to call if you want to call to schedule. Please let us know if you do not hear anything within one week.  

## 2018-04-02 ENCOUNTER — Ambulatory Visit (HOSPITAL_COMMUNITY)
Admission: RE | Admit: 2018-04-02 | Discharge: 2018-04-02 | Disposition: A | Discharge status: Home / Self Care | Payer: 59 | Source: Ambulatory Visit | Attending: Unknown Physician Specialty | Admitting: Unknown Physician Specialty

## 2018-04-02 DIAGNOSIS — Z1231 Encounter for screening mammogram for malignant neoplasm of breast: Secondary | ICD-10-CM | POA: Insufficient documentation

## 2018-04-08 ENCOUNTER — Ambulatory Visit: Payer: 59 | Admitting: Orthopaedic Surgery

## 2018-04-08 ENCOUNTER — Encounter: Payer: Self-pay | Admitting: Orthopaedic Surgery

## 2018-04-08 VITALS — BP 135/87 | HR 107 | Ht 66.0 in | Wt 222.0 lb

## 2018-04-08 DIAGNOSIS — R102 Pelvic and perineal pain: Secondary | ICD-10-CM | POA: Diagnosis not present

## 2018-04-08 DIAGNOSIS — G8929 Other chronic pain: Secondary | ICD-10-CM

## 2018-04-08 DIAGNOSIS — M545 Low back pain: Secondary | ICD-10-CM

## 2018-04-08 MED ORDER — HYDROCODONE-ACETAMINOPHEN 5-325 MG PO TABS: ORAL_TABLET | ORAL | 0 refills | Status: DC

## 2018-04-08 NOTE — Progress Notes (Signed)
Patient Jodi [name], female DOB:14-Jul-1976, 42 y.o. AVW:[id number]  Chief Complaint  Patient presents with  . Back Pain    lumbar     HPI  Jodi [name] is a 42 y.o. female who has continued pain in the lower back and the pelvis.  She is post multiple surgeries on the pelvis.  She went to PT but was told they cannot see her until July 9th.  She has completed her prednisone taper she was given for her asthma.  It did not help her back.  I would like to get a MRI of the lumbar spine to rule out HNP. HPI  Body mass index is 35.83 kg/m.  ROS  Review of Systems  Constitutional: Positive for activity change.  HENT: Positive for congestion.   Respiratory: Positive for cough, chest tightness and shortness of breath.   Endocrine: Positive for cold intolerance.  Musculoskeletal: Positive for arthralgias, back pain and myalgias.  Allergic/Immunologic: Positive for environmental allergies.  All other systems reviewed and are negative.   Past Medical History:  Diagnosis Date  . Abdominal pain, chronic, right upper quadrant   . Acute recurrent maxillary sinusitis 11/04/2014  . Allergic rhinitis   . Anemia   . Angio-edema   . Arthritis    knee  . Asthma    Cough variant  . Asthma   . Asthma   . Back pain   . Chronic pain syndrome 06/08/2013  . DVT (deep venous thrombosis) (HCC) 10 yrs ago  . Dyslipidemia 08/11/2012  . Eczema   . Fractures   . GERD (gastroesophageal reflux disease)   . Heart murmur    as a child  . Hyperlipidemia   . IBS (irritable bowel syndrome)   . 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 Pneumonia   . Shortness of breath    SOB even without exertion at times  . Sickle-cell trait [facility])     Past Surgical History:  Procedure Laterality Date  . ABDOMINAL HYSTERECTOMY  4-5 yrs ago  . ABDOMINAL HYSTERECTOMY    . blood clot removed from lower abdomen  2000  . CHOLECYSTECTOMY  11/12/2012   Procedure: LAPAROSCOPIC CHOLECYSTECTOMY;   Surgeon: Dalia Heading, MD;  Location: [facility] ORS;  Service: General;  Laterality: N/A;  . CHOLECYSTECTOMY    . COLONOSCOPY  11/04/2012   Procedure: COLONOSCOPY;  Surgeon: Malissa Hippo, MD;  Location: [facility];  Service: Endoscopy;  Laterality: N/A;  915  . cyst removed from ovary and fluid removed from tubes  2000  . ESOPHAGOGASTRODUODENOSCOPY  10/22/2012   Procedure: ESOPHAGOGASTRODUODENOSCOPY (EGD);  Surgeon: Malissa Hippo, MD;  Location: [facility];  Service: Endoscopy;  Laterality: N/A;  100  . FRACTURE SURGERY    . HARDWARE REMOVAL Left 02/21/2016   Procedure: HARDWARE REMOVAL LEFT SACROILIAC JOINT;  Surgeon: Myrene Galas, MD;  Location: [facility] OR;  Service: Orthopedics;  Laterality: Left;  . ORIF PELVIC FRACTURE N/A 07/30/2015   Procedure: OPEN REDUCTION INTERNAL FIXATION (ORIF) PELVIC FRACTURE;  Surgeon: Myrene Galas, MD;  Location: [facility] OR;  Service: Orthopedics;  Laterality: N/A;  . PARTIAL HYSTERECTOMY  2009  . SACRO-ILIAC PINNING Right 07/30/2015   Procedure: Loyal Gambler;  Surgeon: Myrene Galas, MD;  Location: [facility] OR;  Service: Orthopedics;  Laterality: Right;  . SACRO-ILIAC PINNING Left 09/04/2015   Procedure: LEFT TO RIGHT TRANS SACRO-ILIAC SCREW;  Surgeon: Myrene Galas, MD;  Location: [facility] OR;  Service: Orthopedics;  Laterality: Left;  .  SACROILIAC JOINT FUSION Left 02/21/2016   Procedure: LEFT SACROILIAC JOINT FUSION;  Surgeon: Myrene Galas, MD;  Location: [facility] OR;  Service: Orthopedics;  Laterality: Left;  . TUBAL LIGATION  1999    Family History  Problem Relation Age of Onset  . Asthma Mother   . Hypertension Mother   . Bronchiolitis Mother   . Hypertension Father   . Hyperlipidemia Father        recurrent rectum polyps   . Allergic rhinitis Father   . Diabetes Sister   . Lupus Sister   . Depression Sister     Social History Social History   Tobacco Use  . Smoking status: Former Smoker    Packs/day: 0.25    Years: 10.00    Pack years: 2.50     Types: Cigarettes    Last attempt to quit: 10/13/2008    Years since quitting: 9.4  . Smokeless tobacco: Never Used  Substance Use Topics  . Alcohol use: No  . Drug use: No    Allergies  Allergen Reactions  . Compazine [Prochlorperazine] Other (See Comments)    Can not control facial muscles, face swells, tongue sticks out, lost oral control    Current Outpatient Medications  Medication Sig Dispense Refill  . albuterol (PROVENTIL HFA;VENTOLIN HFA) 108 (90 BASE) MCG/ACT inhaler Inhale 2 puffs into the lungs every 6 (six) hours as needed for wheezing or shortness of breath. 18 g 0  . amLODipine (NORVASC) 5 MG tablet Take 5 mg by mouth daily.  5  . Azelastine-Fluticasone 137-50 MCG/ACT SUSP Place 2 sprays into both nostrils See admin instructions. 1-2 times daily. 23 g 5  . budesonide-formoterol (SYMBICORT) 160-4.5 MCG/ACT inhaler Inhale 2 puffs into the lungs 2 (two) times daily. 1 Inhaler 5  . celecoxib (CELEBREX) 100 MG capsule Take 100 mg by mouth daily.    . cyclobenzaprine (FLEXERIL) 10 MG tablet Take 1 tablet (10 mg total) by mouth 3 (three) times daily. 20 tablet 0  . desipramine (NORPRAMIN) 50 MG tablet Take 50 mg by mouth at bedtime.    . fluticasone (FLONASE) 50 MCG/ACT nasal spray Place 2 sprays into both nostrils daily. 16 g 5  . ipratropium-albuterol (DUONEB) 0.5-2.5 (3) MG/3ML SOLN Take 3 mLs by nebulization every 6 (six) hours as needed (shortness of breath).     Marland Kitchen levocetirizine (XYZAL) 5 MG tablet TAKE ONE TABLET BY MOUTH ONCE DAILY IN THE EVENING 30 tablet 0  . meloxicam (MOBIC) 15 MG tablet Take 1 tablet (15 mg total) by mouth daily. 6 tablet 0  . mometasone-formoterol (DULERA) 200-5 MCG/ACT AERO Inhale 2 puffs into the lungs 2 (two) times daily. With spacer 13 g 3  . montelukast (SINGULAIR) 10 MG tablet TAKE ONE TABLET BY MOUTH IN THE MORNING 30 tablet 0  . naproxen sodium (ANAPROX) 220 MG tablet Take 440 mg by mouth 2 (two) times daily with a meal.    . omeprazole  (PRILOSEC) 20 MG capsule Take 1 capsule (20 mg total) by mouth daily. 30 capsule 5  . predniSONE (DELTASONE) 10 MG tablet Take 10 mg by mouth daily with breakfast.    . Tiotropium Bromide Monohydrate (SPIRIVA RESPIMAT) 1.25 MCG/ACT AERS Inhale 2 puffs into the lungs daily. 4 g 5  . HYDROcodone-acetaminophen (NORCO/VICODIN) 5-325 MG tablet One tablet by mouth every six hours as needed for pain.  Seven day limit 28 tablet 0   No current facility-administered medications for this visit.      Physical Exam  Blood  pressure 135/87, pulse (!) 107, height 5\' 6"  (1.676 m), weight 222 lb (100.7 kg), last menstrual period 05/17/2012.  Constitutional: overall normal hygiene, normal nutrition, well developed, normal grooming, normal body habitus. Assistive device:none  Musculoskeletal: gait and station Limp none, muscle tone and strength are normal, no tremors or atrophy is present.  .  Neurological: coordination overall normal.  Deep tendon reflex/nerve stretch intact.  Sensation normal.  Cranial nerves II-XII intact.   Skin:   Normal overall no scars, lesions, ulcers or rashes. No psoriasis.  Psychiatric: Alert and oriented x 3.  Recent memory intact, remote memory unclear.  Normal mood and affect. Well groomed.  Good eye contact.  Cardiovascular: overall no swelling, no varicosities, no edema bilaterally, normal temperatures of the legs and arms, no clubbing, cyanosis and good capillary refill.  Lymphatic: palpation is normal.  Her lower back is painful.  NV intact.  SLR negative.  All other systems reviewed and are negative   The patient has [name] educated about the nature of the problem(s) and counseled on treatment options.  The patient appeared to understand what I have discussed and is in agreement with it.  Encounter Diagnoses  Name Primary?  . Chronic midline low back pain without sciatica Yes  . Pelvic pain     PLAN Call if any problems.  Precautions discussed.  Continue current  medications.   Return to clinic after MRI lumbar spine   I [place] [facility] [place] web site prior to prescribing narcotic medicine for this patient.  Electronically Signed Darreld McleanWayne Chaselynn Kepple, MD 6/27/201910:33 AM

## 2018-04-08 NOTE — Patient Instructions (Signed)
..  Your MRI has been ordered.  We will contact your insurance company for approval. After the authorization is received the MRI and a return appointment will be scheduled with you by phone.  If you do not hear from us within 5 business days please call [phone] and ask for the pre-authorization representative in our office.  

## 2018-04-09 DIAGNOSIS — J301 Allergic rhinitis due to pollen: Secondary | ICD-10-CM | POA: Diagnosis not present

## 2018-04-09 DIAGNOSIS — I10 Essential (primary) hypertension: Secondary | ICD-10-CM | POA: Diagnosis not present

## 2018-04-09 DIAGNOSIS — J4551 Severe persistent asthma with (acute) exacerbation: Secondary | ICD-10-CM | POA: Diagnosis not present

## 2018-04-13 ENCOUNTER — Ambulatory Visit (INDEPENDENT_AMBULATORY_CARE_PROVIDER_SITE_OTHER): Payer: 59 | Admitting: *Deleted

## 2018-04-13 ENCOUNTER — Ambulatory Visit (HOSPITAL_COMMUNITY)
Admission: RE | Admit: 2018-04-13 | Discharge: 2018-04-13 | Disposition: A | Discharge status: Home / Self Care | Payer: 59 | Source: Ambulatory Visit | Attending: Orthopaedic Surgery | Admitting: Orthopaedic Surgery

## 2018-04-13 DIAGNOSIS — M5136 Other intervertebral disc degeneration, lumbar region: Secondary | ICD-10-CM | POA: Diagnosis not present

## 2018-04-13 DIAGNOSIS — J309 Allergic rhinitis, unspecified: Secondary | ICD-10-CM

## 2018-04-13 DIAGNOSIS — G8929 Other chronic pain: Secondary | ICD-10-CM | POA: Diagnosis not present

## 2018-04-13 DIAGNOSIS — M48061 Spinal stenosis, lumbar region without neurogenic claudication: Secondary | ICD-10-CM | POA: Insufficient documentation

## 2018-04-13 DIAGNOSIS — R102 Pelvic and perineal pain: Secondary | ICD-10-CM

## 2018-04-13 DIAGNOSIS — M545 Low back pain, unspecified: Secondary | ICD-10-CM

## 2018-04-20 ENCOUNTER — Ambulatory Visit: Payer: 59 | Admitting: Orthopaedic Surgery

## 2018-04-20 ENCOUNTER — Ambulatory Visit (INDEPENDENT_AMBULATORY_CARE_PROVIDER_SITE_OTHER): Payer: 59

## 2018-04-20 ENCOUNTER — Encounter (HOSPITAL_COMMUNITY): Payer: Self-pay

## 2018-04-20 ENCOUNTER — Ambulatory Visit (HOSPITAL_COMMUNITY): Payer: 59 | Attending: Orthopaedic Surgery

## 2018-04-20 ENCOUNTER — Other Ambulatory Visit: Payer: Self-pay

## 2018-04-20 ENCOUNTER — Encounter: Payer: Self-pay | Admitting: Orthopaedic Surgery

## 2018-04-20 VITALS — BP 146/99 | HR 111 | Ht 66.0 in | Wt 227.0 lb

## 2018-04-20 DIAGNOSIS — M545 Low back pain, unspecified: Secondary | ICD-10-CM

## 2018-04-20 DIAGNOSIS — J309 Allergic rhinitis, unspecified: Secondary | ICD-10-CM

## 2018-04-20 DIAGNOSIS — R102 Pelvic and perineal pain: Secondary | ICD-10-CM

## 2018-04-20 DIAGNOSIS — G8929 Other chronic pain: Secondary | ICD-10-CM | POA: Diagnosis not present

## 2018-04-20 DIAGNOSIS — M6281 Muscle weakness (generalized): Secondary | ICD-10-CM | POA: Insufficient documentation

## 2018-04-20 DIAGNOSIS — R29898 Other symptoms and signs involving the musculoskeletal system: Secondary | ICD-10-CM | POA: Insufficient documentation

## 2018-04-20 MED ORDER — HYDROCODONE-ACETAMINOPHEN 5-325 MG PO TABS: ORAL_TABLET | ORAL | 0 refills | Status: DC

## 2018-04-20 NOTE — Progress Notes (Signed)
Patient ZO:XWRUE Jodi [name], female DOB:07/03/1976, 42 y.o. AVW:[id number]  CC:  My back is hurting  HPI  Jodi [name] is a 42 y.o. female who has lower back pain and pelvic pain.  She has been to PT earlier today and is in some slight pain.  She had a MRI of the lumbar spine which showed: IMPRESSION: 1. L5-S1 and sacral detail obscured by sacral hardware susceptibility artifact. 2. Mild disc and posterior element degeneration at L3-L4 resulting in borderline to mild stenosis at the level of the left L3 and L4 nerves. No lumbar spinal stenosis or other neural impingement.  I have explained the findings to her.  I would like her to do two full weeks of PT.  I will see her in two weeks and see how she is doing.  She may need to be seen at [facility] for this problem.  She understands.  I will refill her medicine today.   Body mass index is 36.64 kg/m.  ROS  Review of Systems  Constitutional: Positive for activity change.  HENT: Positive for congestion.   Respiratory: Positive for cough, chest tightness and shortness of breath.   Endocrine: Positive for cold intolerance.  Musculoskeletal: Positive for arthralgias, back pain and myalgias.  Allergic/Immunologic: Positive for environmental allergies.  All other systems reviewed and are negative.   All other systems reviewed and are negative.  Past Medical History:  Diagnosis Date  . Abdominal pain, chronic, right upper quadrant   . Acute recurrent maxillary sinusitis 11/04/2014  . Allergic rhinitis   . Anemia   . Angio-edema   . Arthritis    knee  . Asthma    Cough variant  . Asthma   . Asthma   . Back pain   . Chronic pain syndrome 06/08/2013  . DVT (deep venous thrombosis) (HCC) 10 yrs ago  . Dyslipidemia 08/11/2012  . Eczema   . Fractures   . GERD (gastroesophageal reflux disease)   . Heart murmur    as a child  . Hyperlipidemia   . IBS (irritable bowel syndrome)   . Lung granuloma (HCC) 07/22/2011   4mm per CT  chest  . Pneumonia   . Shortness of breath    SOB even without exertion at times  . Sickle-cell trait [facility])     Past Surgical History:  Procedure Laterality Date  . ABDOMINAL HYSTERECTOMY  4-5 yrs ago  . ABDOMINAL HYSTERECTOMY    . blood clot removed from lower abdomen  2000  . CHOLECYSTECTOMY  11/12/2012   Procedure: LAPAROSCOPIC CHOLECYSTECTOMY;  Surgeon: Dalia Heading, MD;  Location: [facility];  Service: General;  Laterality: N/A;  . CHOLECYSTECTOMY    . COLONOSCOPY  11/04/2012   Procedure: COLONOSCOPY;  Surgeon: Malissa Hippo, MD;  [facility];  Service: Endoscopy;  Laterality: N/A;  915  . cyst removed from ovary and fluid removed from tubes  2000  . ESOPHAGOGASTRODUODENOSCOPY  10/22/2012   Procedure: ESOPHAGOGASTRODUODENOSCOPY (EGD);  Surgeon: Malissa Hippo, MD;  [facility];  Service: Endoscopy;  Laterality: N/A;  100  . FRACTURE SURGERY    . HARDWARE REMOVAL Left 02/21/2016   Procedure: HARDWARE REMOVAL LEFT SACROILIAC JOINT;  Surgeon: Myrene Galas, MD;  Location[facility] OR;  Service: Orthopedics;  Laterality: Left;  . ORIF PELVIC FRACTURE N/A 07/30/2015   Procedure: OPEN REDUCTION INTERNAL FIXATION (ORIF) PELVIC FRACTURE;  Surgeon: Myrene Galas, MD;  Location[facility] OR;  Service: Orthopedics;  Laterality: N/A;  . PARTIAL HYSTERECTOMY  2009  . SACRO-ILIAC PINNING Right 07/30/2015   Procedure: Loyal GamblerSACRO-ILIAC PINNING;  Surgeon: Myrene GalasMichael Handy, MD;  [facility];  Service: Orthopedics;  Laterality: Right;  . SACRO-ILIAC PINNING Left 09/04/2015   Procedure: LEFT TO RIGHT TRANS SACRO-ILIAC SCREW;  Surgeon: Myrene GalasMichael Handy, MD;  [facility];  Service: Orthopedics;  Laterality: Left;  . SACROILIAC JOINT FUSION Left 02/21/2016   Procedure: LEFT SACROILIAC JOINT FUSION;  Surgeon: Myrene GalasMichael Handy, MD;  [facility];  Service: Orthopedics;  Laterality: Left;  . TUBAL LIGATION  1999    Family History  Problem Relation Age of Onset  . Asthma Mother   . Hypertension  Mother   . Bronchiolitis Mother   . Hypertension Father   . Hyperlipidemia Father        recurrent rectum polyps   . Allergic rhinitis Father   . Diabetes Sister   . Lupus Sister   . Depression Sister     Social History Social History   Tobacco Use  . Smoking status: Former Smoker    Packs/day: 0.25    Years: 10.00    Pack years: 2.50    Types: Cigarettes    Last attempt to quit: 10/13/2008    Years since quitting: 9.5  . Smokeless tobacco: Never Used  Substance Use Topics  . Alcohol use: No  . Drug use: No    Allergies  Allergen Reactions  . Prochlorperazine Other (See Comments) and Anaphylaxis    Can not control facial muscles, face swells, tongue sticks out, lost oral control Can not control facial muscles Other reaction(s): Other (See Comments) Lost oral control Face swells, tongue sticks out     Current Outpatient Medications  Medication Sig Dispense Refill  . albuterol (PROVENTIL HFA;VENTOLIN HFA) 108 (90 BASE) MCG/ACT inhaler Inhale 2 puffs into the lungs every 6 (six) hours as needed for wheezing or shortness of breath. 18 g 0  . amLODipine (NORVASC) 5 MG tablet Take 5 mg by mouth daily.  5  . Azelastine-Fluticasone 137-50 MCG/ACT SUSP Place 2 sprays into both nostrils See admin instructions. 1-2 times daily. 23 g 5  . budesonide-formoterol (SYMBICORT) 160-4.5 MCG/ACT inhaler Inhale 2 puffs into the lungs 2 (two) times daily. 1 Inhaler 5  . celecoxib (CELEBREX) 100 MG capsule Take 100 mg by mouth daily.    . cyclobenzaprine (FLEXERIL) 10 MG tablet Take 1 tablet (10 mg total) by mouth 3 (three) times daily. 20 tablet 0  . desipramine (NORPRAMIN) 50 MG tablet Take 50 mg by mouth at bedtime.    Marland Kitchen. HYDROcodone-acetaminophen (NORCO/VICODIN) 5-325 MG tablet One tablet by mouth every six hours as needed for pain.  Seven day limit 28 tablet 0  . ipratropium-albuterol (DUONEB) 0.5-2.5 (3) MG/3ML SOLN Take 3 mLs by nebulization every 6 (six) hours as needed (shortness of  breath).     Marland Kitchen. levocetirizine (XYZAL) 5 MG tablet TAKE ONE TABLET BY MOUTH ONCE DAILY IN THE EVENING 30 tablet 0  . meloxicam (MOBIC) 15 MG tablet Take 1 tablet (15 mg total) by mouth daily. 6 tablet 0  . mometasone-formoterol (DULERA) 200-5 MCG/ACT AERO Inhale 2 puffs into the lungs 2 (two) times daily. With spacer 13 g 3  . montelukast (SINGULAIR) 10 MG tablet TAKE ONE TABLET BY MOUTH IN THE MORNING 30 tablet 0  . naproxen sodium (ANAPROX) 220 MG tablet Take 440 mg by mouth 2 (two) times daily with a meal.    . omeprazole (PRILOSEC) 20 MG capsule Take 1 capsule (20 mg total)  by mouth daily. 30 capsule 5  . predniSONE (DELTASONE) 10 MG tablet Take 10 mg by mouth daily with breakfast.    . Tiotropium Bromide Monohydrate (SPIRIVA RESPIMAT) 1.25 MCG/ACT AERS Inhale 2 puffs into the lungs daily. 4 g 5  . fluticasone (FLONASE) 50 MCG/ACT nasal spray Place 2 sprays into both nostrils daily. 16 g 5   No current facility-administered medications for this visit.      Physical Exam  Blood pressure (!) 146/99, pulse (!) 111, height 5\' 6"  (1.676 m), weight 227 lb (103 kg), last menstrual period 05/17/2012.  Constitutional: overall normal hygiene, normal nutrition, well developed, normal grooming, normal body habitus. Assistive device:none  Musculoskeletal: gait and station Limp none, muscle tone and strength are normal, no tremors or atrophy is present.  .  Neurological: coordination overall normal.  Deep tendon reflex/nerve stretch intact.  Sensation normal.  Cranial nerves II-XII intact.   Skin:   Normal overall no scars, lesions, ulcers or rashes. No psoriasis.  Psychiatric: Alert and oriented x 3.  Recent memory intact, remote memory unclear.  Normal mood and affect. Well groomed.  Good eye contact.  Cardiovascular: overall no swelling, no varicosities, no edema bilaterally, normal temperatures of the legs and arms, no clubbing, cyanosis and good capillary refill.  Lymphatic: palpation is  normal.  Spine/Pelvis examination:  Inspection:  Overall, sacoiliac joint benign and hips nontender; without crepitus or defects.   Thoracic spine inspection: Alignment normal without kyphosis present   Lumbar spine inspection:  Alignment  with normal lumbar lordosis, without scoliosis apparent.   Thoracic spine palpation:  without tenderness of spinal processes   Lumbar spine palpation: without tenderness of lumbar area; without tightness of lumbar muscles    Range of Motion:   Lumbar flexion, forward flexion is normal without pain or tenderness    Lumbar extension is full without pain or tenderness   Left lateral bend is normal without pain or tenderness   Right lateral bend is normal without pain or tenderness   Straight leg raising is normal  Strength & tone: normal   Stability overall normal stability All other systems reviewed and are negative   The patient has been educated about the nature of the problem(s) and counseled on treatment options.  The patient appeared to understand what I have discussed and is in agreement with it.  Encounter Diagnoses  Name Primary?  . Chronic midline low back pain without sciatica Yes  . Pelvic pain     PLAN Call if any problems.  Precautions discussed.  Continue current medications.   Return to clinic 2 weeks   Continue PT.  I have reviewed the [place] Controlled Substance Reporting System web site prior to prescribing narcotic medicine for this patient.  Electronically Signed Darreld Mclean, MD 7/9/201910:44 AM

## 2018-04-20 NOTE — Patient Instructions (Signed)
Access Code: [email or web address]  URL: [email or web address]  Date: 04/20/2018  Prepared by: Jac CanavanBrooke Denijah Karrer   Exercises Supine Double Knee to Chest - 10 reps - 3 sets - 1x daily - 7x weekly

## 2018-04-20 NOTE — Therapy (Signed)
[facility] [address] [place], [place], [address] Phone: [phone]   Fax:  [phone]  Physical Therapy Evaluation  Patient Details  Name: Jodi [name] MRN: [id number] Date of Birth: Dec 09, 1975 Referring Provider: Darreld Mclean, MD   Encounter Date: 04/20/2018  PT End of Session - 04/20/18 1222    Visit Number  1    Number of Visits  19    Date for PT Re-Evaluation  06/01/18 mini reassessment 05/12/18    Authorization Type  [employer]    Authorization Time Period  04/20/18 to 06/01/18    Authorization - Visit Number  1    Authorization - Number of Visits  60    PT Start Time  0815    PT Stop Time  0856    PT Time Calculation (min)  41 min    Activity Tolerance  Patient tolerated treatment well    Behavior During Therapy  [facility] for tasks assessed/performed       Past Medical History:  Diagnosis Date  . Abdominal pain, chronic, right upper quadrant   . Acute recurrent maxillary sinusitis 11/04/2014  . Allergic rhinitis   . Anemia   . Angio-edema   . Arthritis    knee  . Asthma    Cough variant  . Asthma   . Asthma   . Back pain   . Chronic pain syndrome 06/08/2013  . DVT (deep venous thrombosis) (HCC) 10 yrs ago  . Dyslipidemia 08/11/2012  . Eczema   . Fractures   . GERD (gastroesophageal reflux disease)   . Heart murmur    as a child  . Hyperlipidemia   . IBS (irritable bowel syndrome)   . 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 Pneumonia   . Shortness of breath    SOB even without exertion at times  . Sickle-cell trait [facility])     Past Surgical History:  Procedure Laterality Date  . ABDOMINAL HYSTERECTOMY  4-5 yrs ago  . ABDOMINAL HYSTERECTOMY    . blood clot removed from lower abdomen  2000  . CHOLECYSTECTOMY  11/12/2012   Procedure: LAPAROSCOPIC CHOLECYSTECTOMY;  Surgeon: Dalia Heading, MD;  Location: [facility] ORS;  Service: General;  Laterality: N/A;  . CHOLECYSTECTOMY    . COLONOSCOPY   11/04/2012   Procedure: COLONOSCOPY;  Surgeon: Malissa Hippo, MD;  Location: [facility];  Service: Endoscopy;  Laterality: N/A;  915  . cyst removed from ovary and fluid removed from tubes  2000  . ESOPHAGOGASTRODUODENOSCOPY  10/22/2012   Procedure: ESOPHAGOGASTRODUODENOSCOPY (EGD);  Surgeon: Malissa Hippo, MD;  Location: [facility];  Service: Endoscopy;  Laterality: N/A;  100  . FRACTURE SURGERY    . HARDWARE REMOVAL Left 02/21/2016   Procedure: HARDWARE REMOVAL LEFT SACROILIAC JOINT;  Surgeon: Myrene Galas, MD;  Location[facility] OR;  Service: Orthopedics;  Laterality: Left;  . ORIF PELVIC FRACTURE N/A 07/30/2015   Procedure: OPEN REDUCTION INTERNAL FIXATION (ORIF) PELVIC FRACTURE;  Surgeon: Myrene Galas, MD;  Location: [facility] OR;  Service: Orthopedics;  Laterality: N/A;  . PARTIAL HYSTERECTOMY  2009  . SACRO-ILIAC PINNING Right 07/30/2015   Procedure: Loyal Gambler;  Surgeon: Myrene Galas, MD;  Location: [facility] OR;  Service: Orthopedics;  Laterality: Right;  . SACRO-ILIAC PINNING Left 09/04/2015   Procedure: LEFT TO RIGHT TRANS SACRO-ILIAC SCREW;  Surgeon: Myrene Galas, MD;  Location: [facility] OR;  Service: Orthopedics;  Laterality: Left;  . SACROILIAC JOINT FUSION Left  02/21/2016   Procedure: LEFT SACROILIAC JOINT FUSION;  Surgeon: Myrene Galas, MD;  Location: [facility] OR;  Service: Orthopedics;  Laterality: Left;  . TUBAL LIGATION  1999    There were no vitals filed for this visit.   Subjective Assessment - 04/20/18 0818    Subjective  Pt states that she had a horse accident in 2016 when the horse reared up on her and came back down on her. She has plates and screws in the front of her pelvis (3 on one side, 2 on the other, and a fused bone in the back). She was also told that she had 2-3 fractured vertebrae but that they had healed properly without treatment. She states that in 2017 she had taken physical therapy and improved to the point that she could ride again; when stepping up into her  tack room and felt immediate pain and had some spasms. One of her screws had come loose so she had another surgery to fuse that bone. She had been fine from 2017 until about 4 weeks ago. She states that she wore her cowgirl boots out to feed her horses, she had pain in her back (which would usually go away) but her pain has not decreased since. She denies any other trauma. No radicular symptoms or b/b changes. Her pain is mainly located across her lower back and pelvis region with L being worse than R.     Limitations  Standing;Walking;House hold activities;Lifting    How long can you sit comfortably?  depends on the chair; 1 hr in one and the other one she has no issues    How long can you stand comfortably?  10 mins    How long can you walk comfortably?  15 mins, but varies    Diagnostic tests  MRI on 04/13/18    Patient Stated Goals  decrease back pain    Currently in Pain?  Yes    Pain Score  3     Pain Location  Back    Pain Orientation  Right;Left    Pain Descriptors / Indicators  Dull;Aching    Pain Type  Chronic pain    Pain Onset  More than a month ago    Pain Frequency  Constant    Aggravating Factors   standing, walking    Pain Relieving Factors  sitting, rest    Effect of Pain on Daily Activities  restricting everything         [facility] PT Assessment - 04/20/18 0001      Assessment   Medical Diagnosis  chronic midline low back pain without sciatica, pelvic pain    Referring Provider  Darreld Mclean, MD    Onset Date/Surgical Date  03/23/18 surgery in 2016 and 2017; Dr. Carola Frost performed all 3 surgerie    Next MD Visit  04/20/18    Prior Therapy  yes for back in 2016-2017      Precautions   Precautions  None      Balance Screen   Has the patient fallen in the past 6 months  No    Has the patient had a decrease in activity level because of a fear of falling?   No    Is the patient reluctant to leave their home because of a fear of falling?   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); Farm duties (part-time)    Leisure  Engineer, maintenance (IT)iding horses, dog shows      Observation/Other Assessments   Focus on Therapeutic Outcomes (FOTO)   to be completed next visit      Functional Tests   Functional tests  Sit to Stand      Sit to Stand   Comments  30 sec chair rise test: 9, painful      ROM / Strength   AROM / PROM / Strength  AROM;Strength      AROM   AROM Assessment Site  Lumbar    Lumbar Flexion  WFL, slight lordosis, pain on return to extension    Lumbar Extension  WFL    Lumbar - Right Side Bend  knee joint, pain on L    Lumbar - Left Side Bend  just above knee joint, more painful on L    Lumbar - Right Rotation  WFL    Lumbar - Left Rotation  WFL, minor pain      Strength   Strength Assessment Site  Hip;Knee;Ankle    Right Hip Flexion  5/5    Right Hip Extension  4+/5    Right Hip ABduction  4+/5    Left Hip Flexion  4+/5    Left Hip Extension  4/5    Left Hip ABduction  4+/5    Right Knee Flexion  5/5    Right Knee Extension  5/5    Left Knee Flexion  4+/5 L pelvic pain    Left Knee Extension  5/5    Right Ankle Dorsiflexion  5/5    Left Ankle Dorsiflexion  5/5      Flexibility   Soft Tissue Assessment /Muscle Length  yes    Hamstrings  WNL, BLE    Quadriceps  +Ely's BLE, R>L      Palpation   Spinal mobility  thoracic hypomobile, lumbar slightly hypomobile with tenderness to palpation    Palpation comment  soft tissue restrictions of bil lumbar paraspinals and glutes thorughout, L worse than R, reported recreation of LBP with palpation to lumbar paraspinals      Special Tests    Special Tests  Lumbar    Lumbar Tests  Straight Leg Raise      Straight Leg Raise   Findings  Negative    Comment  BLE      Ambulation/Gait   Ambulation Distance (Feet)  -- to be completed next visit      Balance   Balance Assessed  Yes       Static Standing Balance   Static Standing - Balance Support  No upper extremity supported    Static Standing Balance -  Activities   Single Leg Stance - Right Leg;Single Leg Stance - Left Leg    Static Standing - Comment/# of Minutes  R: 1:00, L: 44 sec           Objective measurements completed on examination: See above findings.        PT Education - 04/20/18 1222    Education Details  exam findings, POC, HEP    Person(s) Educated  Patient    Methods  Explanation;Demonstration;Handout    Comprehension  Verbalized understanding;Returned demonstration       PT Short Term Goals - 04/20/18 1226      PT SHORT TERM GOAL #1   Title  Pt will have 1/2 grade improvement in MMT throughout proximal hips in order to decrease pain.  Time  3    Period  Weeks    Status  New    Target Date  05/11/18      PT SHORT TERM GOAL #2   Title  Pt will have improved L SLS to 60 sec without pain to demo improved core strength.    Time  3    Period  Weeks    Status  New      PT SHORT TERM GOAL #3   Title  Pt will have improved 30 sec chair rise test to 11x  without pain in order to demo improved balance and functional strength.    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    Time  6    Period  Weeks    Status  New    Target Date  06/01/18      PT LONG TERM GOAL #2   Title  Pt will have improved 30 sec chair rise test to 13x without pain to demo improved functional and core strength.    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    Time  6    Period   Weeks    Status  New      PT LONG TERM GOAL #4   Title  Pt will report being able to sleep through the night without awakening due to LBP in order to maximize her recovery.    Time  6    Period  Weeks    Status  New             Plan - 04/20/18 1224    Clinical Impression Statement  Pt is pleasant 41YO F who presents to OPPT with c/o LBP. Pt has h/o sacral surgeries in 2016-2017 (L SI joint fusion with screws and R SI screws; also has h/o fractured vertebrae which have healed well with conservative treatment per pt) s/p fall from horse in 2016. Pt reports that her pain is located across her lower back and at the base of her ischium on the L side. Pt has deficits in BLE MMT, core strength, balance, gait, functional strength, and hypomobility throughout thoracic and lumbar spine and increased soft tissue restrictions of lumbar paraspinals and glutes, L>R. Pt needs skilled PT intervention to address these impairments in order to decrease her pain and maximize her return to PLOF.     Clinical Presentation  Stable    Clinical Presentation due to:  MMT, core strength, balnace, functional strength, SLS, 30 sec chair rise test, palpation, flexbility    Clinical Decision Making  Low    Rehab Potential  Good    PT Frequency  3x / week will likely decrease to 2x/week following progress at mini reassessment 05/12/18    PT Duration  6 weeks    PT  Treatment/Interventions  ADLs/Self Care Home Management;Aquatic Therapy;Cryotherapy;Electrical Stimulation;Moist Heat;Ultrasound;DME Instruction;Gait training;Stair training;Functional mobility training;Therapeutic activities;Therapeutic exercise;Balance training;Neuromuscular re-education;Patient/family education;Manual techniques;Scar mobilization;Passive range of motion;Dry needling;Energy conservation;Taping    PT Next Visit Plan  review goals and HEP; perform FOTO and ; initiate core, functional, BLE strengthening, flexbility, and manual for soft tissue  restrictions    PT Home Exercise Plan  eval: DKTC    Consulted and Agree with Plan of Care  Patient       Patient will benefit from skilled therapeutic intervention in order to improve the following deficits and impairments:  Abnormal gait, Decreased activity tolerance, Decreased balance, Decreased endurance, Decreased mobility, Decreased range of motion, Decreased strength, Difficulty walking, Hypomobility, Increased fascial restricitons, Increased muscle spasms, Impaired flexibility, Improper body mechanics, Pain  Visit Diagnosis: Acute bilateral low back pain without sciatica - Plan: PT plan of care cert/re-cert  Muscle weakness (generalized) - Plan: PT plan of care cert/re-cert  Other symptoms and signs involving the musculoskeletal system - Plan: PT plan of care cert/re-cert     Problem List Patient Active Problem List   Diagnosis Date Noted  . Moderate persistent asthma, uncomplicated 09/24/2017  . Seasonal and perennial allergic rhinitis 09/24/2017  . SI joint arthritis 02/21/2016  . SI (sacroiliac) joint dysfunction 09/04/2015  . Fall from horse 07/30/2015  . Lumbar transverse process fracture (HCC) 07/30/2015  . Pelvic ring fracture (HCC) 07/28/2015  . Primary generalized (osteo)arthritis 03/13/2015  . Asthma 11/17/2014  . Gastroesophageal reflux disease 11/17/2014  . Vitamin D deficiency 11/04/2014  . Dyslipidemia 08/11/2012  . 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 Allergic rhinitis 02/19/2010       Jac Canavan PT, DPT  [facility] [address] [place], [place], [address] Phone: [phone]   Fax:  [phone]  Name: KEITH CANCIO MRN: [id number] Date of Birth: [birth year]

## 2018-04-21 ENCOUNTER — Ambulatory Visit (HOSPITAL_COMMUNITY): Payer: 59

## 2018-04-21 DIAGNOSIS — M545 Low back pain, unspecified: Secondary | ICD-10-CM

## 2018-04-21 DIAGNOSIS — M6281 Muscle weakness (generalized): Secondary | ICD-10-CM

## 2018-04-21 DIAGNOSIS — R29898 Other symptoms and signs involving the musculoskeletal system: Secondary | ICD-10-CM

## 2018-04-21 NOTE — Therapy (Signed)
[facility] [address] [place], [place], [address] Phone: [phone]   Fax:  [phone]  Physical Therapy Treatment  Patient Details  Name: Jodi FellersJaime M Owensby MRN: [id number] Date of Birth: 11/23/1975 Referring Provider: Darreld McleanWayne Keeling, MD   Encounter Date: 04/21/2018  PT End of Session - 04/21/18 0912    Visit Number  2    Number of Visits  19    Date for PT Re-Evaluation  06/01/18 mini reassessment 05/12/18    Authorization Type  [employer]    Authorization Time Period  04/20/18 to 06/01/18    Authorization - Visit Number  2    Authorization - Number of Visits  60    PT Start Time  0912 pt arrived late    PT Stop Time  0943    PT Time Calculation (min)  31 min    Activity Tolerance  Patient tolerated treatment well    Behavior During Therapy  [facility]/performed       Past Medical History:  Diagnosis Date  . Abdominal pain, chronic, right upper quadrant   . Acute recurrent maxillary sinusitis 11/04/2014  . Allergic rhinitis   . Anemia   . Angio-edema   . Arthritis    knee  . Asthma    Cough variant  . Asthma   . Asthma   . Back pain   . Chronic pain syndrome 06/08/2013  . DVT (deep venous thrombosis) (HCC) 10 yrs ago  . Dyslipidemia 08/11/2012  . Eczema   . Fractures   . GERD (gastroesophageal reflux disease)   . Heart murmur    as a child  . Hyperlipidemia   . IBS (irritable bowel syndrome)   . 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 Pneumonia   . Shortness of breath    SOB even without exertion at times  . Sickle-cell trait [facility](HCC)     Past Surgical History:  Procedure Laterality Date  . ABDOMINAL HYSTERECTOMY  4-5 yrs ago  . ABDOMINAL HYSTERECTOMY    . blood clot removed from lower abdomen  2000  . CHOLECYSTECTOMY  11/12/2012   Procedure: LAPAROSCOPIC CHOLECYSTECTOMY;  Surgeon: Dalia HeadingMark A Jenkins, MD;  Location: [facility] ORS;  Service: General;  Laterality: N/A;  . CHOLECYSTECTOMY     . COLONOSCOPY  11/04/2012   Procedure: COLONOSCOPY;  Surgeon: [name], MD;  Location: [facility] END[facility] SUITE;  Service: Endoscopy;  Laterality: N/A;  915  . cyst removed from ovary and fluid removed from tubes  2000  . ESOPHAGOGASTRODUODENOSCOPY  10/22/2012   Procedure: ESOPHAGOGASTRODUODENOSCOPY (EGD);  Surgeon: [name], MD;  Location: [facility];  Service: Endoscopy;  Laterality: N/A;  100  . FRACTURE SURGERY    . HARDWARE REMOVAL Left 02/21/2016   Procedure: HARDWARE REMOVAL LEFT SACROILIAC JOINT;  Surgeon: Myrene GalasMichael Handy, MD;  [facility];  Service: Orthopedics;  Laterality: Left;  . ORIF PELVIC FRACTURE N/A 07/30/2015   Procedure: OPEN REDUCTION INTERNAL FIXATION (ORIF) PELVIC FRACTURE;  Surgeon: Myrene GalasMichael Handy, MD;  [facility];  Service: Orthopedics;  Laterality: N/A;  . PARTIAL HYSTERECTOMY  2009  . SACRO-ILIAC PINNING Right 07/30/2015   Procedure: Loyal GamblerSACRO-ILIAC PINNING;  Surgeon: Myrene GalasMichael Handy, MD;  [facility];  Service: Orthopedics;  Laterality: Right;  . SACRO-ILIAC PINNING Left 09/04/2015   Procedure: LEFT TO RIGHT TRANS SACRO-ILIAC SCREW;  Surgeon: Myrene GalasMichael Handy, MD;  [facility];  Service: Orthopedics;  Laterality: Left;  . SACROILIAC  JOINT FUSION Left 02/21/2016   Procedure: LEFT SACROILIAC JOINT FUSION;  Surgeon: Myrene Galas, MD;  Location: [facility] OR;  Service: Orthopedics;  Laterality: Left;  . TUBAL LIGATION  1999    There were no vitals filed for this visit.  Subjective Assessment - 04/21/18 0913    Subjective  Pt reports that she is tired. Her back still hurts. Dr. Hilda Lias told her that she has arthritis and mild stenosis in her back. She states that he wants her to try PT for 2 weeks and if she is not better then she will go to [facility] for futher f/u/assessment.    Limitations  Standing;Walking;House hold activities;Lifting    How long can you sit comfortably?  depends on the chair; 1 hr in one and the other one she has no issues    How long can you  stand comfortably?  10 mins    How long can you walk comfortably?  15 mins, but varies    Diagnostic tests  MRI on 04/13/18    Patient Stated Goals  decrease back pain    Currently in Pain?  Yes    Pain Score  3     Pain Location  Back    Pain Orientation  Right;Left    Pain Descriptors / Indicators  Dull;Aching    Pain Type  Chronic pain    Pain Onset  More than a month ago    Pain Frequency  Constant    Aggravating Factors   standing, walking    Pain Relieving Factors  sitting, rest    Effect of Pain on Daily Activities  restricting everything         [facility] PT Assessment - 04/21/18 0001      Assessment   Medical Diagnosis  chronic midline low back pain without sciatica, pelvic pain    Referring Provider  Darreld Mclean, MD    Onset Date/Surgical Date  03/23/18 surgery in 2016 and 2017; Dr. Carola Frost performed all 3 surgerie    Next MD Visit  05/04/18    Prior Therapy  yes for back in 2016-2017      Observation/Other Assessments   Focus on Therapeutic Outcomes (FOTO)   72% limitation      Ambulation/Gait   Ambulation Distance (Feet)  526 Feet    Assistive device  None    Gait Pattern  Step-through pattern;Decreased dorsiflexion - right;Decreased dorsiflexion - left;Antalgic;Trendelenburg;Wide base of support increased lumbar rotation and decreased pelvic rotation bil           [facility] Adult PT Treatment/Exercise - 04/21/18 0001      Exercises   Exercises  Lumbar      Lumbar Exercises: Stretches   Double Knee to Chest Stretch  3 reps;30 seconds    Lower Trunk Rotation Limitations  10x5" bil      Lumbar Exercises: Supine   Ab Set  10 reps;5 seconds    AB Set Limitations  belly button down and up    Bent Knee Raise  10 reps;3 seconds    Bent Knee Raise Limitations  with ab set            PT Education - 04/21/18 0912    Education Details  reviewed goals and HEP; exercise technique    Person(s) Educated  Patient    Methods  Explanation;Demonstration;Handout     Comprehension  Verbalized understanding;Returned demonstration       PT Short Term Goals - 04/20/18 1226  PT SHORT TERM GOAL #1   Title  Pt will have 1/2 grade improvement in MMT throughout proximal hips in order to decrease pain.    Time  3    Period  Weeks    Status  New    Target Date  05/11/18      PT SHORT TERM GOAL #2   Title  Pt will have improved L SLS to 60 sec without pain to demo improved core strength.    Time  3    Period  Weeks    Status  New      PT SHORT TERM GOAL #3   Title  Pt will have improved 30 sec chair rise test to 11x  without pain in order to demo improved balance and functional strength.    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    Time  6    Period  Weeks    Status  New    Target Date  06/01/18      PT LONG TERM GOAL #2   Title  Pt will have improved 30 sec chair rise test to 13x without pain to demo improved functional and core strength.    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    Time  6    Period  Weeks    Status  New      PT LONG TERM GOAL #4   Title  Pt will report being able to sleep through the night without awakening due to LBP in order to maximize her recovery.    Time  6    Period  Weeks    Status  New            Plan - 04/20/18 1224    Clinical Impression Statement  Pt is pleasant 41YO F who presents to OPPT with c/o LBP. Pt has h/o sacral surgeries in 2016-2017 (L SI joint fusion with screws and R SI screws; also has h/o fractured vertebrae which have healed well with conservative  treatment per pt) s/p fall from horse in 2016. Pt reports that her pain is located across her lower back and at the base of her ischium on the L side. Pt has deficits in BLE MMT, core strength, balance, gait, functional strength, and hypomobility throughout thoracic and lumbar spine and increased soft tissue restrictions of lumbar paraspinals and glutes, L>R. Pt needs skilled PT intervention to address these impairments in order to decrease her pain and maximize her return to PLOF.     Clinical Presentation  Stable    Clinical Presentation due to:  MMT, core strength, balnace, functional strength, SLS, 30 sec chair rise test, palpation, flexbility    Clinical Decision Making  Low    Rehab Potential  Good    PT Frequency  3x / week will likely decrease to 2x/week following progress at mini reassessment 05/12/18    PT Duration  6 weeks    PT Treatment/Interventions  ADLs/Self Care Home Management;Aquatic Therapy;Cryotherapy;Electrical Stimulation;Moist Heat;Ultrasound;DME Instruction;Gait training;Stair training;Functional mobility training;Therapeutic activities;Therapeutic exercise;Balance training;Neuromuscular re-education;Patient/family education;Manual techniques;Scar mobilization;Passive range of motion;Dry needling;Energy conservation;Taping    PT Next Visit Plan  review goals and HEP; perform FOTO and ; initiate core, functional, BLE strengthening, flexbility, and manual for soft tissue restrictions    PT Home Exercise Plan  eval: DKTC    Consulted and Agree with Plan of Care  Patient       Patient will benefit from skilled therapeutic intervention in order to improve the following deficits and impairments:     Visit Diagnosis: Acute bilateral low back pain without sciatica  Muscle weakness (generalized)  Other symptoms and signs involving the musculoskeletal system     Problem List Patient Active Problem List   Diagnosis Date Noted  . Moderate persistent asthma, uncomplicated  09/24/2017  . Seasonal and perennial allergic rhinitis 09/24/2017  . SI joint arthritis 02/21/2016  . SI (sacroiliac) joint dysfunction 09/04/2015  . Fall from horse 07/30/2015  . Lumbar transverse process fracture (HCC) 07/30/2015  . Pelvic ring fracture (HCC) 07/28/2015  . Primary generalized (osteo)arthritis 03/13/2015  . Asthma 11/17/2014  . Gastroesophageal reflux disease 11/17/2014  . Vitamin D deficiency 11/04/2014  . Dyslipidemia 08/11/2012  . 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 Allergic rhinitis 02/19/2010       Jac Canavan PT, DPT  [facility] [address] [place], [place], [address] Phone: [phone]   Fax:  [phone]  Name: JESTINA STEPHANI MRN: [id number] Date of Birth: [birth year]

## 2018-04-23 ENCOUNTER — Encounter (HOSPITAL_COMMUNITY): Payer: Self-pay

## 2018-04-23 ENCOUNTER — Ambulatory Visit (HOSPITAL_COMMUNITY): Payer: 59

## 2018-04-23 DIAGNOSIS — M6281 Muscle weakness (generalized): Secondary | ICD-10-CM

## 2018-04-23 DIAGNOSIS — M545 Low back pain, unspecified: Secondary | ICD-10-CM

## 2018-04-23 DIAGNOSIS — R29898 Other symptoms and signs involving the musculoskeletal system: Secondary | ICD-10-CM

## 2018-04-23 NOTE — Therapy (Signed)
[facility] [address] [place], [place], [address] Phone: [phone]   Fax:  [phone]  Physical Therapy Treatment  Patient Details  Name: Jodi [name] MRN: [id number] Date of Birth: [birth year] Referring Provider: Darreld Mclean, MD   Encounter Date: 04/23/2018  PT End of Session - 04/23/18 0928    Visit Number  3    Number of Visits  19    Date for PT Re-Evaluation  06/01/18 minireassess 05/12/2018    Authorization Type  [employer]    Authorization Time Period  04/20/18 to 06/01/18    Authorization - Visit Number  3    Authorization - Number of Visits  60    PT Start Time  [phone] pt late for apt, stated having phone issues to call about delay    PT Stop Time  0947    PT Time Calculation (min)  29 min    Activity Tolerance  Patient tolerated treatment well    Behavior During Therapy  [facility] for tasks assessed/performed       Past Medical History:  Diagnosis Date  . Abdominal pain, chronic, right upper quadrant   . Acute recurrent maxillary sinusitis 11/04/2014  . Allergic rhinitis   . Anemia   . Angio-edema   . Arthritis    knee  . Asthma    Cough variant  . Asthma   . Asthma   . Back pain   . Chronic pain syndrome 06/08/2013  . DVT (deep venous thrombosis) (HCC) 10 yrs ago  . Dyslipidemia 08/11/2012  . Eczema   . Fractures   . GERD (gastroesophageal reflux disease)   . Heart murmur    as a child  . Hyperlipidemia   . IBS (irritable bowel syndrome)   . 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 Pneumonia   . Shortness of breath    SOB even without exertion at times  . Sickle-cell trait [facility])     Past Surgical History:  Procedure Laterality Date  . ABDOMINAL HYSTERECTOMY  4-5 yrs ago  . ABDOMINAL HYSTERECTOMY    . blood clot removed from lower abdomen  2000  . CHOLECYSTECTOMY  11/12/2012   Procedure: LAPAROSCOPIC CHOLECYSTECTOMY;  Surgeon: Dalia Heading, MD;  Location: [facility] ORS;  Service:  General;  Laterality: N/A;  . CHOLECYSTECTOMY    . COLONOSCOPY  11/04/2012   Procedure: COLONOSCOPY;  Surgeon: Malissa Hippo, MD;  Location: [facility];  Service: Endoscopy;  Laterality: N/A;  915  . cyst removed from ovary and fluid removed from tubes  2000  . ESOPHAGOGASTRODUODENOSCOPY  10/22/2012   Procedure: ESOPHAGOGASTRODUODENOSCOPY (EGD);  Surgeon: Malissa Hippo, MD;  Location: [facility];  Service: Endoscopy;  Laterality: N/A;  100  . FRACTURE SURGERY    . HARDWARE REMOVAL Left 02/21/2016   Procedure: HARDWARE REMOVAL LEFT SACROILIAC JOINT;  Surgeon: Myrene Galas, MD;  Location: [facility] OR;  Service: Orthopedics;  Laterality: Left;  . ORIF PELVIC FRACTURE N/A 07/30/2015   Procedure: OPEN REDUCTION INTERNAL FIXATION (ORIF) PELVIC FRACTURE;  Surgeon: Myrene Galas, MD;  [facility];  Service: Orthopedics;  Laterality: N/A;  . PARTIAL HYSTERECTOMY  2009  . SACRO-ILIAC PINNING Right 07/30/2015   Procedure: Loyal Gambler;  Surgeon: Myrene Galas, MD;  Location: [facility] OR;  Service: Orthopedics;  Laterality: Right;  . SACRO-ILIAC PINNING Left 09/04/2015   Procedure: LEFT TO RIGHT TRANS SACRO-ILIAC SCREW;  Surgeon: Myrene Galas, MD;  Location: [facility] OR;  Service: Orthopedics;  Laterality: Left;  . SACROILIAC JOINT FUSION Left 02/21/2016   Procedure: LEFT SACROILIAC JOINT FUSION;  Surgeon: Myrene Galas, MD;  Location: [facility] OR;  Service: Orthopedics;  Laterality: Left;  . TUBAL LIGATION  1999    There were no vitals filed for this visit.  Subjective Assessment - 04/23/18 0921    Subjective  Pt late for apt today, reports she has pain with her grandkids.  Reports she has began completeing HEP at home.  Pain scale 6/10 achey dull pain.    Patient Stated Goals  decrease back pain    Currently in Pain?  Yes    Pain Score  6     Pain Location  Back    Pain Orientation  Lower;Left;Right Lt > Rt    Pain Descriptors / Indicators  Aching;Dull spasms across lower back    Pain Type  Chronic  pain    Pain Onset  More than a month ago    Pain Frequency  Constant    Aggravating Factors   standing, walking    Pain Relieving Factors  sitting, rest    Effect of Pain on Daily Activities  restricting everything                       [facility] Adult PT Treatment/Exercise - 04/23/18 0001      Lumbar Exercises: Stretches   Single Knee to Chest Stretch  1 rep;30 seconds      Lumbar Exercises: Supine   Ab Set  10 reps;5 seconds    AB Set Limitations  belly button down and up    Bent Knee Raise  10 reps;3 seconds    Bent Knee Raise Limitations  with ab set    Bridge  5 reps    Other Supine Lumbar Exercises  gluteal sets 10x 5"      Manual Therapy   Manual Therapy  Soft tissue mobilization    Manual therapy comments  manual complete separate than rest of tx    Soft tissue mobilization  STM to QL and gluteal musculature in prone               PT Short Term Goals - 04/20/18 1226      PT SHORT TERM GOAL #1   Title  Pt will have 1/2 grade improvement in MMT throughout proximal hips in order to decrease pain.    Time  3    Period  Weeks    Status  New    Target Date  05/11/18      PT SHORT TERM GOAL #2   Title  Pt will have improved L SLS to 60 sec without pain to demo improved core strength.    Time  3    Period  Weeks    Status  New      PT SHORT TERM GOAL #3   Title  Pt will have improved 30 sec chair rise test to 11x  without pain in order to demo improved balance and functional strength.    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    Time  6  Period  Weeks    Status  New    Target Date  06/01/18      PT LONG TERM GOAL #2   Title  Pt will have improved 30 sec chair rise test to 13x without pain to demo improved functional and  core strength.    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    Time  6    Period  Weeks    Status  New      PT LONG TERM GOAL #4   Title  Pt will report being able to sleep through the night without awakening due to LBP in order to maximize her recovery.    Time  6    Period  Weeks    Status  New            Plan - 04/23/18 [id number]    Clinical Impression Statement  Pt late for apt today, reports multiple spasms in lower back today.  Session focus on core and proximal strengthening to support lumbar musculature.  Stretches complete for mobility.  EOS with manual soft tissue mobilization to reduce spasms, pt able to tolerate prone position well. Reports pain reduced to 3/10 followoing manual.     Rehab Potential  Good    PT Frequency  3x / week    PT Duration  6 weeks    PT Treatment/Interventions  ADLs/Self Care Home Management;Aquatic Therapy;Cryotherapy;Electrical Stimulation;Moist Heat;Ultrasound;DME Instruction;Gait training;Stair training;Functional mobility training;Therapeutic activities;Therapeutic exercise;Balance training;Neuromuscular re-education;Patient/family education;Manual techniques;Scar mobilization;Passive range of motion;Dry needling;Energy conservation;Taping    PT Next Visit Plan  Continue core, functional, BLE strengthening, flexbility, and manual for soft tissue restrictions    PT Home Exercise Plan  eval: DKTC       Patient will benefit from skilled therapeutic intervention in order to improve the following deficits and impairments:  Abnormal gait, Decreased activity tolerance, Decreased balance, Decreased endurance, Decreased mobility, Decreased range of motion, Decreased strength, Difficulty walking, Hypomobility, Increased fascial  restricitons, Increased muscle spasms, Impaired flexibility, Improper body mechanics, Pain  Visit Diagnosis: Acute bilateral low back pain without sciatica  Muscle weakness (generalized)  Other symptoms and signs involving the musculoskeletal system     Problem List Patient Active Problem List   Diagnosis Date Noted  . Moderate persistent asthma, uncomplicated 09/24/2017  . Seasonal and perennial allergic rhinitis 09/24/2017  . SI joint arthritis 02/21/2016  . SI (sacroiliac) joint dysfunction 09/04/2015  . Fall from horse 07/30/2015  . Lumbar transverse process fracture (HCC) 07/30/2015  . Pelvic ring fracture (HCC) 07/28/2015  . Primary generalized (osteo)arthritis 03/13/2015  . Asthma 11/17/2014  . Gastroesophageal reflux disease 11/17/2014  . Vitamin D deficiency 11/04/2014  . Dyslipidemia 08/11/2012  . 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 Allergic rhinitis 02/19/2010   Becky Saxasey Burrel Legrand, LPTA; CBIS [phone]  Juel BurrowCockerham, Ayline Dingus Jo 04/23/2018, 10:11 AM  [facility] [address] [place], [place], [address] Phone: [phone]   Fax:  [phone]  Name: Jodi [name] MRN: [id number] Date of Birth: 07/03/1976

## 2018-04-26 ENCOUNTER — Ambulatory Visit (HOSPITAL_COMMUNITY): Payer: 59 | Admitting: Physical Therapy

## 2018-04-26 ENCOUNTER — Telehealth: Payer: Self-pay | Admitting: Orthopaedic Surgery

## 2018-04-26 ENCOUNTER — Encounter: Payer: Self-pay | Admitting: Orthopaedic Surgery

## 2018-04-26 DIAGNOSIS — M545 Low back pain, unspecified: Secondary | ICD-10-CM

## 2018-04-26 DIAGNOSIS — R29898 Other symptoms and signs involving the musculoskeletal system: Secondary | ICD-10-CM

## 2018-04-26 DIAGNOSIS — M6281 Muscle weakness (generalized): Secondary | ICD-10-CM

## 2018-04-26 NOTE — Telephone Encounter (Signed)
Patient's employer, [name], has sent a request for work status. 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  Please review and advise as to when patient is cleared for work.

## 2018-04-26 NOTE — Therapy (Signed)
[facility] [address] [place], [place], [address] Phone: [phone]   Fax:  [phone]  Physical Therapy Treatment  Patient Details  Name: Jodi [name] MRN: [id number] Date of Birth: 09/22/1976 Referring Provider: Darreld McleanWayne Keeling, MD   Encounter Date: 04/26/2018  PT End of Session - 04/26/18 1651    Visit Number  4    Number of Visits  19    Date for PT Re-Evaluation  06/01/18 minireassess 05/12/2018    Authorization Type  [employer]    Authorization Time Period  04/20/18 to 06/01/18    Authorization - Visit Number  4    Authorization - Number of Visits  60    PT Start Time  1605    PT Stop Time  1645    PT Time Calculation (min)  40 min    Activity Tolerance  Patient tolerated treatment well    Behavior During Therapy  [facility] for tasks assessed/performed       Past Medical History:  Diagnosis Date  . Abdominal pain, chronic, right upper quadrant   . Acute recurrent maxillary sinusitis 11/04/2014  . Allergic rhinitis   . Anemia   . Angio-edema   . Arthritis    knee  . Asthma    Cough variant  . Asthma   . Asthma   . Back pain   . Chronic pain syndrome 06/08/2013  . DVT (deep venous thrombosis) (HCC) 10 yrs ago  . Dyslipidemia 08/11/2012  . Eczema   . Fractures   . GERD (gastroesophageal reflux disease)   . Heart murmur    as a child  . Hyperlipidemia   . IBS (irritable bowel syndrome)   . 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 Pneumonia   . Shortness of breath    SOB even without exertion at times  . Sickle-cell trait [employer](HCC)     Past Surgical History:  Procedure Laterality Date  . ABDOMINAL HYSTERECTOMY  4-5 yrs ago  . ABDOMINAL HYSTERECTOMY    . blood clot removed from lower abdomen  2000  . CHOLECYSTECTOMY  11/12/2012   Procedure: LAPAROSCOPIC CHOLECYSTECTOMY;  Surgeon: Dalia HeadingMark A Jenkins, MD;  Location: [facility];  Service: General;  Laterality: N/A;  . CHOLECYSTECTOMY    . COLONOSCOPY   11/04/2012   Procedure: COLONOSCOPY;  Surgeon: [name], MD;  Location: [facility];  Service: Endoscopy;  Laterality: N/A;  915  . cyst removed from ovary and fluid removed from tubes  2000  . ESOPHAGOGASTRODUODENOSCOPY  10/22/2012   Procedure: ESOPHAGOGASTRODUODENOSCOPY (EGD);  Surgeon: [name], MD;  [facility]: [facility];  Service: Endoscopy;  Laterality: N/A;  100  . FRACTURE SURGERY    . HARDWARE REMOVAL Left 02/21/2016   Procedure: HARDWARE REMOVAL LEFT SACROILIAC JOINT;  Surgeon: Myrene GalasMichael Handy, MD;  [facility];  Service: Orthopedics;  Laterality: Left;  . ORIF PELVIC FRACTURE N/A 07/30/2015   Procedure: OPEN REDUCTION INTERNAL FIXATION (ORIF) PELVIC FRACTURE;  Surgeon: Myrene GalasMichael Handy, MD;  [facility];  Service: Orthopedics;  Laterality: N/A;  . PARTIAL HYSTERECTOMY  2009  . SACRO-ILIAC PINNING Right 07/30/2015   Procedure: Loyal GamblerSACRO-ILIAC PINNING;  Surgeon: Myrene GalasMichael Handy, MD;  [facility];  Service: Orthopedics;  Laterality: Right;  . SACRO-ILIAC PINNING Left 09/04/2015   Procedure: LEFT TO RIGHT TRANS SACRO-ILIAC SCREW;  Surgeon: Myrene GalasMichael Handy, MD;  [facility];  Service: Orthopedics;  Laterality: Left;  . SACROILIAC JOINT FUSION Left 02/21/2016  Procedure: LEFT SACROILIAC JOINT FUSION;  Surgeon: Myrene Galas, MD;  Location: [facility] OR;  Service: Orthopedics;  Laterality: Left;  . TUBAL LIGATION  1999    There were no vitals filed for this visit.  Subjective Assessment - 04/26/18 1657    Subjective  Pt states she is doing better today than last visit but still with c/o pain at 5/10 in [place] region.  continues to be compliant with HEP.     Currently in Pain?  Yes    Pain Score  5     Pain Orientation  Left;Lower    Pain Descriptors / Indicators  Aching    Pain Type  Chronic pain                       [facility] Adult PT Treatment/Exercise - 04/26/18 0001      Lumbar Exercises: Stretches   Single Knee to Chest Stretch  1 rep;30 seconds     Lower Trunk Rotation Limitations  10x5" bil      Lumbar Exercises: Standing   Heel Raises  10 reps;Limitations    Heel Raises Limitations  toeraises 10 reps    Forward Lunge  10 reps;Limitations    Forward Lunge Limitations  no UE's onto 4" step      Lumbar Exercises: Supine   Ab Set  5 seconds;15 reps    AB Set Limitations  belly button down and up    Bent Knee Raise  10 reps;3 seconds    Bent Knee Raise Limitations  with ab set    Bridge  10 reps    Other Supine Lumbar Exercises  gluteal sets 10x 5"      Manual Therapy   Manual Therapy  Soft tissue mobilization    Manual therapy comments  manual complete separate than rest of tx    Soft tissue mobilization  STM to QL and gluteal musculature in prone               PT Short Term Goals - 04/20/18 1226      PT SHORT TERM GOAL #1   Title  Pt will have 1/2 grade improvement in MMT throughout proximal hips in order to decrease pain.    Time  3    Period  Weeks    Status  New    Target Date  05/11/18      PT SHORT TERM GOAL #2   Title  Pt will have improved L SLS to 60 sec without pain to demo improved core strength.    Time  3    Period  Weeks    Status  New      PT SHORT TERM GOAL #3   Title  Pt will have improved 30 sec chair rise test to 11x  without pain in order to demo improved balance and functional strength.    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    Time  6    Period  Weeks    Status  New    Target Date  06/01/18      PT LONG TERM GOAL #2   Title  Pt will have improved 30 sec chair rise test to 13x without pain to demo improved functional and core strength.    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    Time  6    Period  Weeks    Status  New      PT LONG TERM GOAL #4   Title  Pt will report being able to sleep through the night without awakening due to LBP in order to maximize her recovery.    Time  6    Period  Weeks    Status  New            Plan - 04/26/18 1652    Clinical Impression Statement  continued with core stab strengthening and flexibility exercises to help reduce LB symptoms and dysfunction.   Added lunges with cues for stabilization to engage core.   Pt able to complete all exercises without c/o pain, however reported bridge as being most uncomfortable.  No spasms or tightness palpated with soft tissue mobilization at end of session.  Pt reported being painfree at end of session.      Rehab Potential  Good    PT Frequency  3x / week    PT Duration  6 weeks    PT Treatment/Interventions  ADLs/Self Care Home Management;Aquatic Therapy;Cryotherapy;Electrical Stimulation;Moist Heat;Ultrasound;DME Instruction;Gait training;Stair training;Functional mobility training;Therapeutic activities;Therapeutic exercise;Balance training;Neuromuscular re-education;Patient/family education;Manual techniques;Scar mobilization;Passive range of motion;Dry needling;Energy conservation;Taping    PT Next Visit Plan  Continue core, functional, BLE strengthening, flexbility, and manual for soft tissue restrictions.      PT Home Exercise Plan  eval: DKTC       Patient will benefit from skilled therapeutic intervention in order to improve the following deficits and impairments:  Abnormal gait, Decreased activity tolerance, Decreased balance, Decreased endurance, Decreased mobility, Decreased range of motion, Decreased strength, Difficulty walking, Hypomobility, Increased fascial restricitons, Increased muscle spasms, Impaired flexibility, Improper body  mechanics, Pain  Visit Diagnosis: Acute bilateral low back pain without sciatica  Muscle weakness (generalized)  Other symptoms and signs involving the musculoskeletal system     Problem List Patient Active Problem List   Diagnosis Date Noted  . Moderate persistent asthma, uncomplicated 09/24/2017  . Seasonal and perennial allergic rhinitis 09/24/2017  . SI joint arthritis 02/21/2016  . SI (sacroiliac) joint dysfunction 09/04/2015  . Fall from horse 07/30/2015  . Lumbar transverse process fracture (HCC) 07/30/2015  . Pelvic ring fracture (HCC) 07/28/2015  . Primary generalized (osteo)arthritis 03/13/2015  . Asthma 11/17/2014  . Gastroesophageal reflux disease 11/17/2014  . Vitamin D deficiency 11/04/2014  . Dyslipidemia 08/11/2012  . 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 Allergic rhinitis 02/19/2010   Lurena Nida, PTA/CLT [phone]  Lurena Nida 04/26/2018, 4:58 PM  [facility] [address] [place], [place], [address] Phone: [phone]   Fax:  [phone]  Name: Jodi [name] MRN: [id number] Date of Birth: 1976/10/03

## 2018-04-27 ENCOUNTER — Ambulatory Visit (INDEPENDENT_AMBULATORY_CARE_PROVIDER_SITE_OTHER): Payer: 59

## 2018-04-27 ENCOUNTER — Telehealth: Payer: Self-pay | Admitting: Orthopaedic Surgery

## 2018-04-27 DIAGNOSIS — J309 Allergic rhinitis, unspecified: Secondary | ICD-10-CM | POA: Diagnosis not present

## 2018-04-27 NOTE — Telephone Encounter (Signed)
She is out of work until she completes PT.  She may have to be out longer and [facility]

## 2018-04-27 NOTE — Telephone Encounter (Signed)
Work status note entered accordingly.

## 2018-04-27 NOTE — Telephone Encounter (Signed)
Note faxed to [name], fax# [phone]/ ph# [phone] for patient's employer, [employer], per request; authorization on file and scanned into chart.Patient aware.

## 2018-04-28 ENCOUNTER — Ambulatory Visit (HOSPITAL_COMMUNITY): Payer: 59

## 2018-04-28 ENCOUNTER — Encounter (HOSPITAL_COMMUNITY): Payer: Self-pay

## 2018-04-28 DIAGNOSIS — R29898 Other symptoms and signs involving the musculoskeletal system: Secondary | ICD-10-CM

## 2018-04-28 DIAGNOSIS — M545 Low back pain, unspecified: Secondary | ICD-10-CM

## 2018-04-28 DIAGNOSIS — M6281 Muscle weakness (generalized): Secondary | ICD-10-CM

## 2018-04-28 NOTE — Therapy (Signed)
[facility] [address] [place], [place], [address] Phone: [phone]   Fax:  [phone]  Physical Therapy Treatment  Patient Details  Name: Jodi [name] MRN: [id number] Date of Birth: Sep 06, 1976 Referring Provider: Darreld Mclean, MD   Encounter Date: 04/28/2018  PT End of Session - 04/28/18 1523    Visit Number  5    Number of Visits  19    Date for PT Re-Evaluation  06/01/18 minireassess 05/12/2018    Authorization Type  [employer]    Authorization Time Period  04/20/18 to 06/01/18    Authorization - Visit Number  5    Authorization - Number of Visits  60    PT Start Time  1521    PT Stop Time  1604    PT Time Calculation (min)  43 min    Activity Tolerance  Patient tolerated treatment well    Behavior During Therapy  [facility] for tasks assessed/performed       Past Medical History:  Diagnosis Date  . Abdominal pain, chronic, right upper quadrant   . Acute recurrent maxillary sinusitis 11/04/2014  . Allergic rhinitis   . Anemia   . Angio-edema   . Arthritis    knee  . Asthma    Cough variant  . Asthma   . Asthma   . Back pain   . Chronic pain syndrome 06/08/2013  . DVT (deep venous thrombosis) (HCC) 10 yrs ago  . Dyslipidemia 08/11/2012  . Eczema   . Fractures   . GERD (gastroesophageal reflux disease)   . Heart murmur    as a child  . Hyperlipidemia   . IBS (irritable bowel syndrome)   . 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 Pneumonia   . Shortness of breath    SOB even without exertion at times  . Sickle-cell trait [facility])     Past Surgical History:  Procedure Laterality Date  . ABDOMINAL HYSTERECTOMY  4-5 yrs ago  . ABDOMINAL HYSTERECTOMY    . blood clot removed from lower abdomen  2000  . CHOLECYSTECTOMY  11/12/2012   Procedure: LAPAROSCOPIC CHOLECYSTECTOMY;  Surgeon: Dalia Heading, MD;  Location: [facility];  Service: General;  Laterality: N/A;  . CHOLECYSTECTOMY    . COLONOSCOPY   11/04/2012   Procedure: COLONOSCOPY;  Surgeon: Malissa Hippo, MD;  [facility];  Service: Endoscopy;  Laterality: N/A;  915  . cyst removed from ovary and fluid removed from tubes  2000  . ESOPHAGOGASTRODUODENOSCOPY  10/22/2012   Procedure: ESOPHAGOGASTRODUODENOSCOPY (EGD);  Surgeon: Malissa Hippo, MD;  [facility];  Service: Endoscopy;  Laterality: N/A;  100  . FRACTURE SURGERY    . HARDWARE REMOVAL Left 02/21/2016   Procedure: HARDWARE REMOVAL LEFT SACROILIAC JOINT;  Surgeon: Myrene Galas, MD;  Location: [facility] OR;  Service: Orthopedics;  Laterality: Left;  . ORIF PELVIC FRACTURE N/A 07/30/2015   Procedure: OPEN REDUCTION INTERNAL FIXATION (ORIF) PELVIC FRACTURE;  Surgeon: Myrene Galas, MD;  [facility];  Service: Orthopedics;  Laterality: N/A;  . PARTIAL HYSTERECTOMY  2009  . SACRO-ILIAC PINNING Right 07/30/2015   Procedure: Loyal Gambler;  Surgeon: Myrene Galas, MD;  Location[facility] OR;  Service: Orthopedics;  Laterality: Right;  . SACRO-ILIAC PINNING Left 09/04/2015   Procedure: LEFT TO RIGHT TRANS SACRO-ILIAC SCREW;  Surgeon: Myrene Galas, MD;  Location: [facility] OR;  Service: Orthopedics;  Laterality: Left;  . SACROILIAC JOINT FUSION Left 02/21/2016  Procedure: LEFT SACROILIAC JOINT FUSION;  Surgeon: Myrene GalasMichael Handy, MD;  [facility];  Service: Orthopedics;  Laterality: Left;  . TUBAL LIGATION  1999    There were no vitals filed for this visit.  Subjective Assessment - 04/28/18 1523    Subjective  Pt reports that she is okay today. Her pain decreased following last session but then spasmed as soon as she got in her car. She states that her spasms have been really bad today.    Currently in Pain?  Yes    Pain Score  5     Pain Location  Back    Pain Orientation  Left;Lower    Pain Descriptors / Indicators  Dull;Aching;Spasm    Pain Type  Chronic pain    Pain Onset  More than a month ago    Pain Frequency  Constant    Aggravating Factors   standing,  walking    Pain Relieving Factors  sitting, rest    Effect of Pain on Daily Activities  restricting everything             [facility] Adult PT Treatment/Exercise - 04/28/18 0001      Lumbar Exercises: Stretches   Single Knee to Chest Stretch  Right;Left;2 reps;30 seconds    Pelvic Tilt  10 reps;5 seconds    Other Lumbar Stretch Exercise  seate lumbar flexion stretch with medium physioball 3x15" each      Lumbar Exercises: Supine   Other Supine Lumbar Exercises  decompression 2-5, 10x3" holds each      Manual Therapy   Manual Therapy  Soft tissue mobilization    Manual therapy comments  manual complete separate than rest of tx    Soft tissue mobilization  STM to L lumbar paraspinals and QL in prone with pillow under hips for lumbar flexion               PT Short Term Goals - 04/20/18 1226      PT SHORT TERM GOAL #1   Title  Pt will have 1/2 grade improvement in MMT throughout proximal hips in order to decrease pain.    Time  3    Period  Weeks    Status  New    Target Date  05/11/18      PT SHORT TERM GOAL #2   Title  Pt will have improved L SLS to 60 sec without pain to demo improved core strength.    Time  3    Period  Weeks    Status  New      PT SHORT TERM GOAL #3   Title  Pt will have improved 30 sec chair rise test to 11x  without pain in order to demo improved balance and functional strength.    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    Time  6    Period  Weeks    Status  New    Target Date  06/01/18      PT LONG TERM GOAL #2   Title  Pt will have improved 30 sec chair rise test to 13x without pain to demo improved functional  and core strength.    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    Time  6    Period  Weeks    Status  New      PT LONG TERM GOAL #4   Title  Pt will report being able to sleep through the night without awakening due to LBP in order to maximize her recovery.    Time  6    Period  Weeks    Status  New            Plan - 04/28/18 1608    Clinical Impression Statement  Pt continues with reports of L-sided lower back pain and muscle spasms of lower back. Continued with lumbar stretching and added decompression exercises this date to assess response. Ended with manual therapy to address soft tissue restrictions; pt with palpable restrictions and taut bands noted. PT placed pillow under pt's pelvis to promote lumbar flexion and pt stated that this position was more comfortable to her. LBP decreased to 3/10 at EOS (was 5/10). Continue as planned, progressing as able.     Rehab Potential  Good    PT Frequency  3x / week    PT Duration  6 weeks    PT Treatment/Interventions  ADLs/Self Care Home Management;Aquatic Therapy;Cryotherapy;Electrical Stimulation;Moist Heat;Ultrasound;DME Instruction;Gait training;Stair training;Functional mobility training;Therapeutic activities;Therapeutic exercise;Balance training;Neuromuscular re-education;Patient/family education;Manual techniques;Scar mobilization;Passive range of motion;Dry needling;Energy conservation;Taping    PT Next Visit Plan  cotninue decompression and add to HEP if tolerated well; Continue core, functional, BLE strengthening, flexbility, and manual for soft tissue restrictions.      PT Home Exercise Plan  eval: DKTC    Consulted and Agree with Plan of Care  Patient       Patient will benefit from skilled therapeutic intervention in order to improve the following deficits and impairments:  Abnormal gait, Decreased activity  tolerance, Decreased balance, Decreased endurance, Decreased mobility, Decreased range of motion, Decreased strength, Difficulty walking, Hypomobility, Increased fascial restricitons, Increased muscle spasms, Impaired flexibility, Improper body mechanics, Pain  Visit Diagnosis: Acute bilateral low back pain without sciatica  Muscle weakness (generalized)  Other symptoms and signs involving the musculoskeletal system     Problem List Patient Active Problem List   Diagnosis Date Noted  . Moderate persistent asthma, uncomplicated 09/24/2017  . Seasonal and perennial allergic rhinitis 09/24/2017  . SI joint arthritis 02/21/2016  . SI (sacroiliac) joint dysfunction 09/04/2015  . Fall from horse 07/30/2015  . Lumbar transverse process fracture (HCC) 07/30/2015  . Pelvic ring fracture (HCC) 07/28/2015  . Primary generalized (osteo)arthritis 03/13/2015  . Asthma 11/17/2014  . Gastroesophageal reflux disease 11/17/2014  . Vitamin D deficiency 11/04/2014  . Dyslipidemia 08/11/2012  . 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 Allergic rhinitis 02/19/2010       Jac Canavan PT, DPT  [facility] [address] [place], [place], [address] Phone: [phone]   Fax:  [phone]  Name: Jodi [name] MRN: [id number] Date of Birth: 18-Feb-1976

## 2018-04-30 ENCOUNTER — Encounter (HOSPITAL_COMMUNITY): Payer: Self-pay | Admitting: Physical Therapy

## 2018-04-30 ENCOUNTER — Ambulatory Visit (HOSPITAL_COMMUNITY): Payer: 59 | Admitting: Physical Therapy

## 2018-04-30 DIAGNOSIS — M545 Low back pain, unspecified: Secondary | ICD-10-CM

## 2018-04-30 DIAGNOSIS — R29898 Other symptoms and signs involving the musculoskeletal system: Secondary | ICD-10-CM

## 2018-04-30 DIAGNOSIS — M6281 Muscle weakness (generalized): Secondary | ICD-10-CM

## 2018-04-30 NOTE — Therapy (Signed)
[facility] [address] [place], [place], [address] Phone: [phone]   Fax:  [phone]  Physical Therapy Treatment  Patient Details  Name: GENI SKORUPSKI MRN: [id number] Date of Birth: [birth year] Referring Provider: Darreld Mclean, MD   Encounter Date: 04/30/2018  PT End of Session - 04/30/18 1454    Visit Number  6    Number of Visits  19    Date for PT Re-Evaluation  06/01/18 minireassess 05/12/2018    Authorization Type  [employer]    Authorization Time Period  04/20/18 to 06/01/18    Authorization - Visit Number  6    Authorization - Number of Visits  60    PT Start Time  1435    PT Stop Time  1515    PT Time Calculation (min)  40 min    Activity Tolerance  Patient tolerated treatment well    Behavior During Therapy  [facility] for tasks assessed/performed       Past Medical History:  Diagnosis Date  . Abdominal pain, chronic, right upper quadrant   . Acute recurrent maxillary sinusitis 11/04/2014  . Allergic rhinitis   . Anemia   . Angio-edema   . Arthritis    knee  . Asthma    Cough variant  . Asthma   . Asthma   . Back pain   . Chronic pain syndrome 06/08/2013  . DVT (deep venous thrombosis) (HCC) 10 yrs ago  . Dyslipidemia 08/11/2012  . Eczema   . Fractures   . GERD (gastroesophageal reflux disease)   . Heart murmur    as a child  . Hyperlipidemia   . IBS (irritable bowel syndrome)   . 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 Pneumonia   . Shortness of breath    SOB even without exertion at times  . Sickle-cell trait [facility])     Past Surgical History:  Procedure Laterality Date  . ABDOMINAL HYSTERECTOMY  4-5 yrs ago  . ABDOMINAL HYSTERECTOMY    . blood clot removed from lower abdomen  2000  . CHOLECYSTECTOMY  11/12/2012   Procedure: LAPAROSCOPIC CHOLECYSTECTOMY;  Surgeon: Dalia Heading, MD;  Location: [facility] ORS;  Service: General;  Laterality: N/A;  . CHOLECYSTECTOMY    . COLONOSCOPY   11/04/2012   Procedure: COLONOSCOPY;  Surgeon: Malissa Hippo, MD;  Location: [facility] ENDO SUITE;  Service: Endoscopy;  Laterality: N/A;  915  . cyst removed from ovary and fluid removed from tubes  2000  . ESOPHAGOGASTRODUODENOSCOPY  10/22/2012   Procedure: ESOPHAGOGASTRODUODENOSCOPY (EGD);  Surgeon: Malissa Hippo, MD;  Location: [facility] SU[facility];  Service: Endoscopy;  Laterality: N/A;  100  . FRACTURE SURGERY    . HARDWARE REMOVAL Left 02/21/2016   Procedure: HARDWARE REMOVAL LEFT SACROILIAC JOINT;  Surgeon: Myrene Galas, MD;  Location: [facility] OR;  Service: Orthopedics;  Laterality: Left;  . ORIF PELVIC FRACTURE N/A 07/30/2015   Procedure: OPEN REDUCTION INTERNAL FIXATION (ORIF) PELVIC FRACTURE;  Surgeon: Myrene Galas, MD;  Location: [facility] OR;  Service: Orthopedics;  Laterality: N/A;  . PARTIAL HYSTERECTOMY  2009  . SACRO-ILIAC PINNING Right 07/30/2015   Procedure: Loyal Gambler;  Surgeon: Myrene Galas, MD;  Location: [facility] OR;  Service: Orthopedics;  Laterality: Right;  . SACRO-ILIAC PINNING Left 09/04/2015   Procedure: LEFT TO RIGHT TRANS SACRO-ILIAC SCREW;  Surgeon: Myrene Galas, MD;  Location: [facility] OR;  Service: Orthopedics;  Laterality: Left;  . SACROILIAC JOINT FUSION Left 02/21/2016  Procedure: LEFT SACROILIAC JOINT FUSION;  Surgeon: Myrene GalasMichael Handy, MD;  [facility];  Service: Orthopedics;  Laterality: Left;  . TUBAL LIGATION  1999    There were no vitals filed for this visit.  Subjective Assessment - 04/30/18 1439    Subjective  Patient reported that her back pain is a 5/10 this session. She stated that it is mostly is dull but occassionally is a sharp pain.     Currently in Pain?  Yes    Pain Score  5     Pain Location  Back    Pain Orientation  Lower    Pain Descriptors / Indicators  Aching    Pain Type  Chronic pain    Pain Onset  More than a month ago                       [facility] Adult PT Treatment/Exercise - 04/30/18 0001      Lumbar Exercises: Stretches    Single Knee to Chest Stretch  Right;Left;2 reps;30 seconds    Pelvic Tilt  10 reps;5 seconds    Other Lumbar Stretch Exercise  seated lumbar flexion stretch with medium physioball 3x15" forward, left and right      Lumbar Exercises: Standing   Functional Squats  Limitations;Other (comment) 6 repetitions before increased pain    Functional Squats Limitations  Bilateral hand hold assist with verbal cues and tactile cues for form    Other Standing Lumbar Exercises  Palloff press with red theraband inside prallel bars x 10 each leg forward      Lumbar Exercises: Supine   Heel Slides  10 reps;Limitations    Heel Slides Limitations  Abdominal set with heel slide for abdominal strengthening    Bent Knee Raise  10 reps;3 seconds    Bent Knee Raise Limitations  with ab set alternating legs    Other Supine Lumbar Exercises  decompression 2-5, 2 x 5 with 3" holds each. Shoulder press bilaterally, head press, leg lengthener, leg press.              PT Education - 04/30/18 1441    Education Details  This session described purpose and technique of exercises throughout session, and educated patient to perform decompression exercises at home.     Person(s) Educated  Patient    Methods  Explanation;Handout    Comprehension  Verbalized understanding       PT Short Term Goals - 04/30/18 1502      PT SHORT TERM GOAL #1   Title  Pt will have 1/2 grade improvement in MMT throughout proximal hips in order to decrease pain.    Time  3    Period  Weeks    Status  On-going      PT SHORT TERM GOAL #2   Title  Pt will have improved L SLS to 60 sec without pain to demo improved core strength.    Time  3    Period  Weeks    Status  On-going      PT SHORT TERM GOAL #3   Title  Pt will have improved 30 sec chair rise test to 11x  without pain in order to demo improved balance and functional strength.    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    Time  6    Period  Weeks    Status  On-going      PT LONG TERM GOAL #2   Title  Pt will have improved 30 sec chair rise test to 13x without pain to demo improved functional and core strength.    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    Time  6    Period  Weeks    Status  On-going      PT LONG TERM GOAL #4   Title  Pt will report being able to sleep through the night without awakening due to LBP in order to maximize her recovery.    Time  6    Period  Weeks    Status  On-going            Plan - 04/30/18 1501    Clinical Impression Statement  This session patient reported that the decompression exercises were helpful, so began session with those and then added them to the patient's HEP. This session continued abdominal strengthening and lower extremity strengthening as well as stretches and mobility exercises for patient's lower back. This session added heel slides with abdominal sets which patient tolerated well. Also added palloff press which patient reported having some increased spasms with, and functional squats. Plan to continue with plan of care to continue progression toward functional goals.     Rehab Potential  Good    PT Frequency  3x / week    PT Duration  6 weeks    PT Treatment/Interventions  ADLs/Self Care Home Management;Aquatic Therapy;Cryotherapy;Electrical Stimulation;Moist Heat;Ultrasound;DME Instruction;Gait training;Stair training;Functional mobility training;Therapeutic activities;Therapeutic exercise;Balance training;Neuromuscular re-education;Patient/family  education;Manual techniques;Scar mobilization;Passive range of motion;Dry needling;Energy conservation;Taping    PT Next Visit Plan  Follow-up on tolerance to decompression exercises at home. Continue core, functional, BLE strengthening, flexbility, and manual for soft tissue restrictions.      PT Home Exercise Plan  eval: DKTC; 04/30/18: Decompression exercises level 1 x 10 3'' holds 1x/day    Consulted and Agree with Plan of Care  Patient       Patient will benefit from skilled therapeutic intervention in order to improve the following deficits and impairments:  Abnormal gait, Decreased activity tolerance, Decreased balance, Decreased endurance, Decreased mobility, Decreased range of motion, Decreased strength, Difficulty walking, Hypomobility, Increased fascial restricitons, Increased muscle spasms, Impaired flexibility, Improper body mechanics, Pain  Visit Diagnosis: Acute bilateral low back pain without sciatica  Muscle weakness (generalized)  Other symptoms and signs involving the musculoskeletal system     Problem List Patient Active Problem List   Diagnosis Date Noted  . Moderate persistent asthma, uncomplicated 09/24/2017  . Seasonal and perennial allergic rhinitis 09/24/2017  . SI joint arthritis 02/21/2016  . SI (sacroiliac) joint dysfunction 09/04/2015  . Fall from horse 07/30/2015  . Lumbar transverse process fracture (HCC) 07/30/2015  . Pelvic ring fracture (HCC) 07/28/2015  . Primary generalized (osteo)arthritis 03/13/2015  . Asthma 11/17/2014  .  Gastroesophageal reflux disease 11/17/2014  . Vitamin D deficiency 11/04/2014  . Dyslipidemia 08/11/2012  . 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 Allergic rhinitis 02/19/2010   Verne Carrow PT, DPT 3:25 PM, 04/30/18 [phone]  [facility] [address] [place], [place], [address] Phone: [phone]   Fax:  [phone]  Name: MADALEN GAVIN MRN: [id number] Date of Birth:  Feb 04, 1976

## 2018-05-03 ENCOUNTER — Telehealth (HOSPITAL_COMMUNITY): Payer: Self-pay

## 2018-05-03 ENCOUNTER — Ambulatory Visit (HOSPITAL_COMMUNITY): Payer: 59

## 2018-05-03 NOTE — Telephone Encounter (Signed)
She has a sore throat and a fever

## 2018-05-04 ENCOUNTER — Ambulatory Visit: Payer: 59 | Admitting: Orthopaedic Surgery

## 2018-05-04 ENCOUNTER — Ambulatory Visit (INDEPENDENT_AMBULATORY_CARE_PROVIDER_SITE_OTHER): Payer: 59 | Admitting: *Deleted

## 2018-05-04 VITALS — BP 143/87 | HR 93 | Temp 97.8°F | Ht 66.0 in | Wt 223.0 lb

## 2018-05-04 DIAGNOSIS — M545 Low back pain: Secondary | ICD-10-CM

## 2018-05-04 DIAGNOSIS — G8929 Other chronic pain: Secondary | ICD-10-CM

## 2018-05-04 DIAGNOSIS — J309 Allergic rhinitis, unspecified: Secondary | ICD-10-CM | POA: Diagnosis not present

## 2018-05-04 NOTE — Patient Instructions (Signed)
[facility].  Please expect a call from them for scheduling.

## 2018-05-05 ENCOUNTER — Ambulatory Visit (HOSPITAL_COMMUNITY): Payer: 59

## 2018-05-05 ENCOUNTER — Telehealth: Payer: Self-pay | Admitting: Orthopaedic Surgery

## 2018-05-05 ENCOUNTER — Encounter (HOSPITAL_COMMUNITY): Payer: Self-pay

## 2018-05-05 ENCOUNTER — Encounter: Payer: Self-pay | Admitting: Orthopaedic Surgery

## 2018-05-05 DIAGNOSIS — M545 Low back pain, unspecified: Secondary | ICD-10-CM

## 2018-05-05 DIAGNOSIS — R29898 Other symptoms and signs involving the musculoskeletal system: Secondary | ICD-10-CM

## 2018-05-05 DIAGNOSIS — M6281 Muscle weakness (generalized): Secondary | ICD-10-CM

## 2018-05-05 MED ORDER — HYDROCODONE-ACETAMINOPHEN 5-325 MG PO TABS: ORAL_TABLET | ORAL | 0 refills | Status: DC

## 2018-05-05 NOTE — Telephone Encounter (Signed)
Thank you - may we re-address the question: "Work status - due to awaiting referral appointment [facility] -  continue out of work? How long?"

## 2018-05-05 NOTE — Therapy (Signed)
[facility] [address] , [place], [address] Phone: [phone]   Fax:  [phone]  Physical Therapy Treatment  Patient Details  Name: Jodi [name] MRN: [id number] Date of Birth: 11/27/1975 Referring Provider: Darreld McleanWayne Keeling, MD   Encounter Date: 05/05/2018  PT End of Session - 05/05/18 0834    Visit Number  7    Number of Visits  19    Date for PT Re-Evaluation  06/01/18 Minireassess 05/12/18    Authorization Type  [employer]    Authorization Time Period  04/20/18 to 06/01/18    Authorization - Visit Number  7    Authorization - Number of Visits  60    PT Start Time  [id number] pt late for apt    PT Stop Time  0902    PT Time Calculation (min)  39 min    Activity Tolerance  Patient tolerated treatment well;No increased pain    Behavior During Therapy  [facility] for tasks assessed/performed       Past Medical History:  Diagnosis Date  . Abdominal pain, chronic, right upper quadrant   . Acute recurrent maxillary sinusitis 11/04/2014  . Allergic rhinitis   . Anemia   . Angio-edema   . Arthritis    knee  . Asthma    Cough variant  . Asthma   . Asthma   . Back pain   . Chronic pain syndrome 06/08/2013  . DVT (deep venous thrombosis) (HCC) 10 yrs ago  . Dyslipidemia 08/11/2012  . Eczema   . Fractures   . GERD (gastroesophageal reflux disease)   . Heart murmur    as a child  . Hyperlipidemia   . IBS (irritable bowel syndrome)   . 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 Pneumonia   . Shortness of breath    SOB even without exertion at times  . Sickle-cell trait [facility](HCC)     Past Surgical History:  Procedure Laterality Date  . ABDOMINAL HYSTERECTOMY  4-5 yrs ago  . ABDOMINAL HYSTERECTOMY    . blood clot removed from lower abdomen  2000  . CHOLECYSTECTOMY  11/12/2012   Procedure: LAPAROSCOPIC CHOLECYSTECTOMY;  Surgeon: Dalia HeadingMark A Jenkins, MD;  Location: [facility] ORS;  Service: General;  Laterality: N/A;  .  CHOLECYSTECTOMY    . COLONOSCOPY  11/04/2012   Procedure: COLONOSCOPY;  Surgeon: [name], MD;  [facility];  Service: Endoscopy;  Laterality: N/A;  915  . cyst removed from ovary and fluid removed from tubes  2000  . ESOPHAGOGASTRODUODENOSCOPY  10/22/2012   Procedure: ESOPHAGOGASTRODUODENOSCOPY (EGD);  Surgeon: [name], MD;  [facility]: [facility];  Service: Endoscopy;  Laterality: N/A;  100  . FRACTURE SURGERY    . HARDWARE REMOVAL Left 02/21/2016   Procedure: HARDWARE REMOVAL LEFT SACROILIAC JOINT;  Surgeon: Myrene GalasMichael Handy, MD;  [facility];  Service: Orthopedics;  Laterality: Left;  . ORIF PELVIC FRACTURE N/A 07/30/2015   Procedure: OPEN REDUCTION INTERNAL FIXATION (ORIF) PELVIC FRACTURE;  Surgeon: Myrene GalasMichael Handy, MD;  [facility];  Service: Orthopedics;  Laterality: N/A;  . PARTIAL HYSTERECTOMY  2009  . SACRO-ILIAC PINNING Right 07/30/2015   Procedure: Loyal GamblerSACRO-ILIAC PINNING;  Surgeon: Myrene GalasMichael Handy, MD;  [facility];  Service: Orthopedics;  Laterality: Right;  . SACRO-ILIAC PINNING Left 09/04/2015   Procedure: LEFT TO RIGHT TRANS SACRO-ILIAC SCREW;  Surgeon: Myrene GalasMichael Handy, MD;  [facility];  Service: Orthopedics;  Laterality: Left;  .  SACROILIAC JOINT FUSION Left 02/21/2016   Procedure: LEFT SACROILIAC JOINT FUSION;  Surgeon: Myrene Galas, MD;  Location: [facility] OR;  Service: Orthopedics;  Laterality: Left;  . TUBAL LIGATION  1999    There were no vitals filed for this visit.  Subjective Assessment - 05/05/18 0829    Subjective  Pt stated she continues to have intermittent spasms lower back, current pain scale 4/10 dull achey pain almost sharpy pain.  Reports compliance iwth HEP daily, feels confident with decompression exercises.    Patient Stated Goals  decrease back pain    Currently in Pain?  Yes    Pain Score  4     Pain Location  Back    Pain Orientation  Lower;Left Lt>Rt    Pain Descriptors / Indicators  Dull;Aching    Pain Type  Chronic pain     Pain Onset  More than a month ago    Pain Frequency  Constant    Aggravating Factors   standing, walking    Pain Relieving Factors  sitting, rest    Effect of Pain on Daily Activities  restricting everything                       [facility] Adult PT Treatment/Exercise - 05/05/18 0001      Lumbar Exercises: Standing   Functional Squats  Limitations;Other (comment);10 reps    Functional Squats Limitations  Bilateral hand hold assist with verbal cues and tactile cues for form    Other Standing Lumbar Exercises  Palloff press with red theraband inside prallel bars x 10 each leg forward      Lumbar Exercises: Supine   Heel Slides  10 reps;Limitations    Heel Slides Limitations  Abdominal set with heel slide for abdominal strengthening    Bent Knee Raise  10 reps;3 seconds    Bent Knee Raise Limitations  with ab set alternating legs    Other Supine Lumbar Exercises  1-4 decompression with RTB 10x 5" each       Manual Therapy   Manual Therapy  Soft tissue mobilization    Manual therapy comments  manual complete separate than rest of tx    Soft tissue mobilization  STM to L lumbar paraspinals and QL in prone with pillow under hips for lumbar flexion               PT Short Term Goals - 04/30/18 1502      PT SHORT TERM GOAL #1   Title  Pt will have 1/2 grade improvement in MMT throughout proximal hips in order to decrease pain.    Time  3    Period  Weeks    Status  On-going      PT SHORT TERM GOAL #2   Title  Pt will have improved L SLS to 60 sec without pain to demo improved core strength.    Time  3    Period  Weeks    Status  On-going      PT SHORT TERM GOAL #3   Title  Pt will have improved 30 sec chair rise test to 11x  without pain in order to demo improved balance and functional strength.    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    Time  6    Period  Weeks    Status  On-going      PT LONG TERM GOAL #2   Title  Pt will have improved 30 sec chair rise test to 13x without pain to demo improved functional and core strength.    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    Time  6    Period  Weeks    Status  On-going      PT LONG TERM GOAL #4   Title  Pt will report being able to sleep through the night without awakening due to LBP in order to maximize her recovery.    Time  6    Period  Weeks    Status  On-going            Plan - 05/05/18 1036    Clinical Impression Statement  Pt reports compliance and understanding with decompression exercises; progressed to decompression exercise wiht resistance for postural strengthening.  Continued session focus with abdominal and LE strengthenig.  Pt required cueing to improve abdominal activaiton wiht LE movements due to weakness.  Continued with palloff exercise with reports of some increased spasms during exercise.  EOS with manual soft tissue mobilization to address restrictions mainly in Lt paraspinals and Bil QL.  No reports of increased pain at EOS.      Rehab Potential  Good    PT Frequency  3x / week    PT Duration  6 weeks    PT Treatment/Interventions  ADLs/Self Care Home Management;Aquatic Therapy;Cryotherapy;Electrical Stimulation;Moist Heat;Ultrasound;DME Instruction;Gait training;Stair training;Functional mobility training;Therapeutic activities;Therapeutic exercise;Balance training;Neuromuscular re-education;Patient/family education;Manual techniques;Scar mobilization;Passive range of motion;Dry needling;Energy conservation;Taping     PT Next Visit Plan  Continue core, functional, BLE strengthening, flexbility, and manual for soft tissue restrictions.      PT Home Exercise Plan  eval: DKTC; 04/30/18: Decompression exercises level 1 x 10 3'' holds 1x/day       Patient will benefit from skilled therapeutic intervention in order to improve the following deficits and impairments:  Abnormal gait, Decreased activity tolerance, Decreased balance, Decreased endurance, Decreased mobility, Decreased range of motion, Decreased strength, Difficulty walking, Hypomobility, Increased fascial restricitons, Increased muscle spasms, Impaired flexibility, Improper body mechanics, Pain  Visit Diagnosis: Acute bilateral low back pain without sciatica  Muscle weakness (generalized)  Other symptoms and signs involving the musculoskeletal system     Problem List Patient Active Problem List   Diagnosis Date Noted  . Moderate persistent asthma, uncomplicated 09/24/2017  . Seasonal and perennial allergic rhinitis 09/24/2017  . SI joint arthritis 02/21/2016  . SI (sacroiliac) joint dysfunction 09/04/2015  . Fall from horse 07/30/2015  . Lumbar transverse process fracture (HCC) 07/30/2015  . Pelvic ring fracture (HCC) 07/28/2015  . Primary generalized (osteo)arthritis 03/13/2015  . Asthma 11/17/2014  . Gastroesophageal reflux disease 11/17/2014  . Vitamin D deficiency 11/04/2014  . Dyslipidemia 08/11/2012  . 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 Allergic rhinitis 02/19/2010   Becky Sax, LPTA;  CBIS [phone]  Juel Burrow 05/05/2018, 10:47 AM  [facility] [address] [place], [place], [address] Phone: [phone]   Fax:  [phone]  Name: Jodi [name] MRN: [id number] Date of Birth: 19-Apr-1976

## 2018-05-05 NOTE — Progress Notes (Signed)
Patient ZO:XWRUE:Jodi [name], female DOB:02/28/1976, 42 y.o. AVW:[id number]  Chief Complaint  Patient presents with  . Follow-up    recheck lumbar spine-not much relief from physical therapy    HPI  Jodi [name] is a 42 y.o. female who has had significant pelvic trauma in the past and had multiple surgeries. She has been to PT several time.  I have reviewed the notes. She says PT helps for about several hours then her back and pelvic pain returns.  She and I have discussed about [facility].  [facility].  I will arrange this.  She has had an unusual injury and is now having pain that has not responded to typical care.   Body mass index is 35.99 kg/m.  ROS  Review of Systems  Constitutional: Positive for activity change.  HENT: Positive for congestion.   Respiratory: Positive for cough, chest tightness and shortness of breath.   Endocrine: Positive for cold intolerance.  Musculoskeletal: Positive for arthralgias, back pain and myalgias.  Allergic/Immunologic: Positive for environmental allergies.  All other systems reviewed and are negative.   All other systems reviewed and are negative.  Past Medical History:  Diagnosis Date  . Abdominal pain, chronic, right upper quadrant   . Acute recurrent maxillary sinusitis 11/04/2014  . Allergic rhinitis   . Anemia   . Angio-edema   . Arthritis    knee  . Asthma    Cough variant  . Asthma   . Asthma   . Back pain   . Chronic pain syndrome 06/08/2013  . DVT (deep venous thrombosis) (HCC) 10 yrs ago  . Dyslipidemia 08/11/2012  . Eczema   . Fractures   . GERD (gastroesophageal reflux disease)   . Heart murmur    as a child  . Hyperlipidemia   . IBS (irritable bowel syndrome)   . 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 Pneumonia   . Shortness of breath    SOB even without exertion at times  . Sickle-cell trait [facility](HCC)     Past Surgical History:  Procedure  Laterality Date  . ABDOMINAL HYSTERECTOMY  4-5 yrs ago  . ABDOMINAL HYSTERECTOMY    . blood clot removed from lower abdomen  2000  . CHOLECYSTECTOMY  11/12/2012   Procedure: LAPAROSCOPIC CHOLECYSTECTOMY;  Surgeon: Dalia HeadingMark A Jenkins, MD;  Location: [facility] ORS;  Service: General;  Laterality: N/A;  . CHOLECYSTECTOMY    . COLONOSCOPY  11/04/2012   Procedure: COLONOSCOPY;  Surgeon: [name], MD;  Location: [facility] END[facility] SUITE;  Service: Endoscopy;  Laterality: N/A;  915  . cyst removed from ovary and fluid removed from tubes  2000  . ESOPHAGOGASTRODUODENOSCOPY  10/22/2012   Procedure: ESOPHAGOGASTRODUODENOSCOPY (EGD);  Surgeon: [name], MD;  Location: [facility];  Service: Endoscopy;  Laterality: N/A;  100  . FRACTURE SURGERY    . HARDWARE REMOVAL Left 02/21/2016   Procedure: HARDWARE REMOVAL LEFT SACROILIAC JOINT;  Surgeon: Myrene GalasMichael Handy, MD;  [facility];  Service: Orthopedics;  Laterality: Left;  . ORIF PELVIC FRACTURE N/A 07/30/2015   Procedure: OPEN REDUCTION INTERNAL FIXATION (ORIF) PELVIC FRACTURE;  Surgeon: Myrene GalasMichael Handy, MD;  [facility];  Service: Orthopedics;  Laterality: N/A;  . PARTIAL HYSTERECTOMY  2009  . SACRO-ILIAC PINNING Right 07/30/2015   Procedure: Loyal GamblerSACRO-ILIAC PINNING;  Surgeon: Myrene GalasMichael Handy, MD;  [facility];  Service: Orthopedics;  Laterality: Right;  . SACRO-ILIAC  PINNING Left 09/04/2015   Procedure: LEFT TO RIGHT TRANS SACRO-ILIAC SCREW;  Surgeon: Myrene Galas, MD;  Location: [facility] OR;  Service: Orthopedics;  Laterality: Left;  . SACROILIAC JOINT FUSION Left 02/21/2016   Procedure: LEFT SACROILIAC JOINT FUSION;  Surgeon: Myrene Galas, MD;  Location: [facility] OR;  Service: Orthopedics;  Laterality: Left;  . TUBAL LIGATION  1999    Family History  Problem Relation Age of Onset  . Asthma Mother   . Hypertension Mother   . Bronchiolitis Mother   . Hypertension Father   . Hyperlipidemia Father        recurrent rectum polyps   . Allergic rhinitis Father   .  Diabetes Sister   . Lupus Sister   . Depression Sister     Social History Social History   Tobacco Use  . Smoking status: Former Smoker    Packs/day: 0.25    Years: 10.00    Pack years: 2.50    Types: Cigarettes    Last attempt to quit: 10/13/2008    Years since quitting: 9.5  . Smokeless tobacco: Never Used  Substance Use Topics  . Alcohol use: No  . Drug use: No    Allergies  Allergen Reactions  . Prochlorperazine Other (See Comments) and Anaphylaxis    Can not control facial muscles, face swells, tongue sticks out, lost oral control Can not control facial muscles Other reaction(s): Other (See Comments) Lost oral control Face swells, tongue sticks out     Current Outpatient Medications  Medication Sig Dispense Refill  . albuterol (PROVENTIL HFA;VENTOLIN HFA) 108 (90 BASE) MCG/ACT inhaler Inhale 2 puffs into the lungs every 6 (six) hours as needed for wheezing or shortness of breath. 18 g 0  . amLODipine (NORVASC) 5 MG tablet Take 5 mg by mouth daily.  5  . Azelastine-Fluticasone 137-50 MCG/ACT SUSP Place 2 sprays into both nostrils See admin instructions. 1-2 times daily. 23 g 5  . budesonide-formoterol (SYMBICORT) 160-4.5 MCG/ACT inhaler Inhale 2 puffs into the lungs 2 (two) times daily. 1 Inhaler 5  . celecoxib (CELEBREX) 100 MG capsule Take 100 mg by mouth daily.    . cyclobenzaprine (FLEXERIL) 10 MG tablet Take 1 tablet (10 mg total) by mouth 3 (three) times daily. 20 tablet 0  . desipramine (NORPRAMIN) 50 MG tablet Take 50 mg by mouth at bedtime.    . fluticasone (FLONASE) 50 MCG/ACT nasal spray Place 2 sprays into both nostrils daily. 16 g 5  . HYDROcodone-acetaminophen (NORCO/VICODIN) 5-325 MG tablet One tablet by mouth every six hours as needed for pain.  Seven day limit 28 tablet 0  . ipratropium-albuterol (DUONEB) 0.5-2.5 (3) MG/3ML SOLN Take 3 mLs by nebulization every 6 (six) hours as needed (shortness of breath).     Marland Kitchen levocetirizine (XYZAL) 5 MG tablet TAKE  ONE TABLET BY MOUTH ONCE DAILY IN THE EVENING 30 tablet 0  . meloxicam (MOBIC) 15 MG tablet Take 1 tablet (15 mg total) by mouth daily. 6 tablet 0  . mometasone-formoterol (DULERA) 200-5 MCG/ACT AERO Inhale 2 puffs into the lungs 2 (two) times daily. With spacer 13 g 3  . montelukast (SINGULAIR) 10 MG tablet TAKE ONE TABLET BY MOUTH IN THE MORNING 30 tablet 0  . naproxen sodium (ANAPROX) 220 MG tablet Take 440 mg by mouth 2 (two) times daily with a meal.    . omeprazole (PRILOSEC) 20 MG capsule Take 1 capsule (20 mg total) by mouth daily. 30 capsule 5  . predniSONE (DELTASONE) 10 MG  tablet Take 10 mg by mouth daily with breakfast.    . Tiotropium Bromide Monohydrate (SPIRIVA RESPIMAT) 1.25 MCG/ACT AERS Inhale 2 puffs into the lungs daily. 4 g 5   No current facility-administered medications for this visit.      Physical Exam  Blood pressure (!) 143/87, pulse 93, temperature 97.8 F (36.6 C), height 5\' 6"  (1.676 m), weight 223 lb (101.2 kg), last menstrual period 05/17/2012.  Constitutional: overall normal hygiene, normal nutrition, well developed, normal grooming, normal body habitus. Assistive device:none  Musculoskeletal: gait and station Limp none, muscle tone and strength are normal, no tremors or atrophy is present.  .  Neurological: coordination overall normal.  Deep tendon reflex/nerve stretch intact.  Sensation normal.  Cranial nerves II-XII intact.   Skin:   Normal overall no scars, lesions, ulcers or rashes. No psoriasis.  Psychiatric: Alert and oriented x 3.  Recent memory intact, remote memory unclear.  Normal mood and affect. Well groomed.  Good eye contact.  Cardiovascular: overall no swelling, no varicosities, no edema bilaterally, normal temperatures of the legs and arms, no clubbing, cyanosis and good capillary refill.  Lymphatic: palpation is normal.  Spine/Pelvis examination:  Inspection:  Overall, sacoiliac joint benign and hips nontender; without crepitus or  defects.   Thoracic spine inspection: Alignment normal without kyphosis present   Lumbar spine inspection:  Alignment  with normal lumbar lordosis, without scoliosis apparent.   Thoracic spine palpation:  without tenderness of spinal processes   Lumbar spine palpation: without tenderness of lumbar area; without tightness of lumbar muscles    Range of Motion:   Lumbar flexion, forward flexion is normal without pain or tenderness    Lumbar extension is full without pain or tenderness   Left lateral bend is normal without pain or tenderness   Right lateral bend is normal without pain or tenderness   Straight leg raising is normal  Strength & tone: normal   Stability overall normal stability All other systems reviewed and are negative   The patient has been educated about the nature of the problem(s) and counseled on treatment options.  The patient appeared to understand what I have discussed and is in agreement with it.  Encounter Diagnosis  Name Primary?  . Chronic midline low back pain without sciatica Yes    PLAN Call if any problems.  Precautions discussed.  Continue current medications.   Return to clinic To make referral to [facility].   She will continue PT for a few more sessions.  Electronically Signed Darreld Mclean, MD 7/24/20197:43 AM

## 2018-05-05 NOTE — Telephone Encounter (Signed)
Patient called regarding office visit yesterday, 05/04/18 to request: (1) Refill HYDROcodone-acetaminophen (NORCO/VICODIN) 5-325 MG tablet 28 tablet  -[facility] [place]  (2) Work status - due to awaiting referral appointment [facility] -  continue out of work? How long?

## 2018-05-06 ENCOUNTER — Telehealth: Payer: Self-pay | Admitting: Radiology

## 2018-05-06 ENCOUNTER — Encounter: Payer: Self-pay | Admitting: Orthopaedic Surgery

## 2018-05-06 NOTE — Telephone Encounter (Signed)
Called patient; note issued, which will cover patient until referral appointment, [facility], scheduled for tomorrow, 05/07/18, 1:15pm; patient aware.

## 2018-05-06 NOTE — Telephone Encounter (Signed)
I called patient and advised of the [facility]

## 2018-05-06 NOTE — Telephone Encounter (Signed)
Ask Glynda status of referral.  Stay out of work.

## 2018-05-07 ENCOUNTER — Ambulatory Visit (HOSPITAL_COMMUNITY): Payer: 59

## 2018-05-07 ENCOUNTER — Encounter (HOSPITAL_COMMUNITY): Payer: Self-pay

## 2018-05-07 DIAGNOSIS — M545 Low back pain, unspecified: Secondary | ICD-10-CM

## 2018-05-07 DIAGNOSIS — G8929 Other chronic pain: Secondary | ICD-10-CM | POA: Diagnosis not present

## 2018-05-07 DIAGNOSIS — R29898 Other symptoms and signs involving the musculoskeletal system: Secondary | ICD-10-CM

## 2018-05-07 DIAGNOSIS — M6281 Muscle weakness (generalized): Secondary | ICD-10-CM

## 2018-05-07 NOTE — Therapy (Signed)
[facility] [address] [place], [place], [address] Phone: [phone]   Fax:  [phone]  Physical Therapy Treatment  Patient Details  Name: Jodi [name] MRN: [id number] Date of Birth: 1975/11/10 Referring Provider: Darreld Mclean, MD   Encounter Date: 05/07/2018  PT End of Session - 05/07/18 0826    Visit Number  8    Number of Visits  19    Date for PT Re-Evaluation  06/01/18 Minireassess 05/12/18    Authorization Type  [employer]    Authorization Time Period  04/20/18 to 06/01/18    Authorization - Visit Number  8    Authorization - Number of Visits  60    PT Start Time  0826 pt arrived late    PT Stop Time  0857    PT Time Calculation (min)  31 min    Activity Tolerance  Patient tolerated treatment well;No increased pain    Behavior During Therapy  WFL for tasks assessed/performed       Past Medical History:  Diagnosis Date  . Abdominal pain, chronic, right upper quadrant   . Acute recurrent maxillary sinusitis 11/04/2014  . Allergic rhinitis   . Anemia   . Angio-edema   . Arthritis    knee  . Asthma    Cough variant  . Asthma   . Asthma   . Back pain   . Chronic pain syndrome 06/08/2013  . DVT (deep venous thrombosis) (HCC) 10 yrs ago  . Dyslipidemia 08/11/2012  . Eczema   . Fractures   . GERD (gastroesophageal reflux disease)   . Heart murmur    as a child  . Hyperlipidemia   . IBS (irritable bowel syndrome)   . 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 Pneumonia   . Shortness of breath    SOB even without exertion at times  . Sickle-cell trait [facility])     Past Surgical History:  Procedure Laterality Date  . ABDOMINAL HYSTERECTOMY  4-5 yrs ago  . ABDOMINAL HYSTERECTOMY    . blood clot removed from lower abdomen  2000  . CHOLECYSTECTOMY  11/12/2012   Procedure: LAPAROSCOPIC CHOLECYSTECTOMY;  Surgeon: Dalia Heading, MD;  Location: [facility] ORS;  Service: General;  Laterality: N/A;  .  CHOLECYSTECTOMY    . COLONOSCOPY  11/04/2012   Procedure: COLONOSCOPY;  Surgeon: Malissa Hippo, MD;  Location: [facility];  Service: Endoscopy;  Laterality: N/A;  915  . cyst removed from ovary and fluid removed from tubes  2000  . ESOPHAGOGASTRODUODENOSCOPY  10/22/2012   Procedure: ESOPHAGOGASTRODUODENOSCOPY (EGD);  Surgeon: Malissa Hippo, MD;  Location: [facility];  Service: Endoscopy;  Laterality: N/A;  100  . FRACTURE SURGERY    . HARDWARE REMOVAL Left 02/21/2016   Procedure: HARDWARE REMOVAL LEFT SACROILIAC JOINT;  Surgeon: Myrene Galas, MD;  Location: [facility] OR;  Service: Orthopedics;  Laterality: Left;  . ORIF PELVIC FRACTURE N/A 07/30/2015   Procedure: OPEN REDUCTION INTERNAL FIXATION (ORIF) PELVIC FRACTURE;  Surgeon: Myrene Galas, MD;  [facility] OR;  Service: Orthopedics;  Laterality: N/A;  . PARTIAL HYSTERECTOMY  2009  . SACRO-ILIAC PINNING Right 07/30/2015   Procedure: Loyal Gambler;  Surgeon: Myrene Galas, MD;  Location: [facility] OR;  Service: Orthopedics;  Laterality: Right;  . SACRO-ILIAC PINNING Left 09/04/2015   Procedure: LEFT TO RIGHT TRANS SACRO-ILIAC SCREW;  Surgeon: Myrene Galas, MD;  Location: [facility] OR;  Service: Orthopedics;  Laterality: Left;  .  SACROILIAC JOINT FUSION Left 02/21/2016   Procedure: LEFT SACROILIAC JOINT FUSION;  Surgeon: Myrene Galas, MD;  Location: [facility] OR;  Service: Orthopedics;  Laterality: Left;  . TUBAL LIGATION  1999    There were no vitals filed for this visit.  Subjective Assessment - 05/07/18 0826    Subjective  Pt reports that ever since she left therapy after her last session, her back has had increased spasms and they haven't decreased in intensity since. She goes to [facility] today for f/u with a specialist for her back/pelvic pain.    Patient Stated Goals  decrease back pain    Currently in Pain?  Yes    Pain Score  5     Pain Location  Back    Pain Orientation  Lower;Left    Pain Descriptors / Indicators  Dull;Aching    Pain Type   Chronic pain    Pain Onset  More than a month ago    Pain Frequency  Constant    Aggravating Factors   standing, walking    Pain Relieving Factors  sitting, rest    Effect of Pain on Daily Activities  restricting everything           [facility] Adult PT Treatment/Exercise - 05/07/18 0001      Lumbar Exercises: Supine   Other Supine Lumbar Exercises  1-4 decompression with RTB 10x 5" each       Manual Therapy   Manual Therapy  Soft tissue mobilization    Manual therapy comments  manual complete separate than rest of tx    Soft tissue mobilization  STM L lumbar paraspinals and QL with pt in sidelying             PT Education - 05/07/18 0826    Education Details  exercise technique; updated HEP    Person(s) Educated  Patient    Methods  Explanation;Demonstration;Handout    Comprehension  Verbalized understanding;Returned demonstration       PT Short Term Goals - 04/30/18 1502      PT SHORT TERM GOAL #1   Title  Pt will have 1/2 grade improvement in MMT throughout proximal hips in order to decrease pain.    Time  3    Period  Weeks    Status  On-going      PT SHORT TERM GOAL #2   Title  Pt will have improved L SLS to 60 sec without pain to demo improved core strength.    Time  3    Period  Weeks    Status  On-going      PT SHORT TERM GOAL #3   Title  Pt will have improved 30 sec chair rise test to 11x  without pain in order to demo improved balance and functional strength.    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    Time  6    Period  Weeks    Status  On-going      PT LONG TERM GOAL #2   Title  Pt will have improved 30 sec chair rise test to 13x without pain to demo improved functional and core  strength.    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    Time  6    Period  Weeks    Status  On-going      PT LONG TERM GOAL #4   Title  Pt will report being able to sleep through the night without awakening due to LBP in order to maximize her recovery.    Time  6    Period  Weeks    Status  On-going            Plan - 05/07/18 [id number]    Clinical Impression Statement  Session limited as pt arrived late for appointment. Pt reporting increased LBP and muscle spasms following last treatment session. Session focused on pain management today. Continued with decompression exercises with theraband and added to HEP. Ended with manual therapy for STM to L lumbar paraspinals for pain control and decreasing spasms. Pt reporting decreased pain to 3-4/10 pain at EOS. [facility] regarding her pelvic surgeries/pain/LBP and will f/u with us afterwards regarding their findings/plan of car going forward.     Rehab Potential  Good    PT Frequency  3x / week    PT Duration  6 weeks    PT Treatment/Interventions  ADLs/Self Care Home Management;Aquatic Therapy;Cryotherapy;Electrical Stimulation;Moist Heat;Ultrasound;DME Instruction;Gait training;Stair training;Functional mobility training;Therapeutic activities;Therapeutic exercise;Balance training;Neuromuscular re-education;Patient/family education;Manual techniques;Scar mobilization;Passive range of motion;Dry needling;Energy conservation;Taping    PT Next Visit Plan  f[facility]u [facility]; Continue core, functional, BLE strengthening, flexbility, and manual for soft tissue restrictions.      PT Home Exercise Plan  eval: DKTC; 04/30/18: Decompression exercises level 1 x 10 3'' holds 1x/day; 7/26: decomp wtih theraband    Consulted and Agree with Plan of Care   Patient       Patient will benefit from skilled therapeutic intervention in order to improve the following deficits and impairments:  Abnormal gait, Decreased activity tolerance, Decreased balance, Decreased endurance, Decreased mobility, Decreased range of motion, Decreased strength, Difficulty walking, Hypomobility, Increased fascial restricitons, Increased muscle spasms, Impaired flexibility, Improper body mechanics, Pain  Visit Diagnosis: Acute bilateral low back pain without sciatica  Muscle weakness (generalized)  Other symptoms and signs involving the musculoskeletal system     Problem List Patient Active Problem List   Diagnosis Date Noted  . Moderate persistent asthma, uncomplicated 09/24/2017  . Seasonal and perennial allergic rhinitis 09/24/2017  . SI joint arthritis 02/21/2016  . SI (sacroiliac) joint dysfunction 09/04/2015  . Fall from horse 07/30/2015  . Lumbar transverse process fracture (HCC) 07/30/2015  . Pelvic ring fracture (HCC) 07/28/2015  . Primary generalized (osteo)arthritis 03/13/2015  . Asthma 11/17/2014  . Gastroesophageal reflux disease 11/17/2014  . Vitamin D deficiency 11/04/2014  . Dyslipidemia 08/11/2012  . 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 Allergic rhinitis 02/19/2010       Jac CanavanBrooke Powell PT, DPT  [facility] [address] [place], [place], [address] Phone: [phone]   Fax:  [phone]  Name: Jodi [name] MRN: [id number] Date of Birth: 04/28/1976

## 2018-05-10 ENCOUNTER — Encounter (HOSPITAL_COMMUNITY): Payer: Self-pay

## 2018-05-10 ENCOUNTER — Ambulatory Visit (HOSPITAL_COMMUNITY): Payer: 59

## 2018-05-10 DIAGNOSIS — M545 Low back pain, unspecified: Secondary | ICD-10-CM

## 2018-05-10 DIAGNOSIS — R29898 Other symptoms and signs involving the musculoskeletal system: Secondary | ICD-10-CM

## 2018-05-10 DIAGNOSIS — M6281 Muscle weakness (generalized): Secondary | ICD-10-CM

## 2018-05-10 NOTE — Therapy (Signed)
[facility] [address] [place], [place], [address] Phone: [phone]   Fax:  [phone]  Physical Therapy Treatment  Patient Details  Name: Jodi [name] MRN: [id number] Date of Birth: 1976-09-09 Referring Provider: Darreld Mclean, MD   Encounter Date: 05/10/2018  PT End of Session - 05/10/18 0824    Visit Number  9    Number of Visits  19    Date for PT Re-Evaluation  06/01/18 Minireassess 05/12/18    Authorization Type  [employer]    Authorization Time Period  04/20/18 to 06/01/18    Authorization - Visit Number  9    Authorization - Number of Visits  60    PT Start Time  0824 pt late    PT Stop Time  0857    PT Time Calculation (min)  33 min    Activity Tolerance  Patient tolerated treatment well;No increased pain    Behavior During Therapy  WFL for tasks assessed/performed       Past Medical History:  Diagnosis Date  . Abdominal pain, chronic, right upper quadrant   . Acute recurrent maxillary sinusitis 11/04/2014  . Allergic rhinitis   . Anemia   . Angio-edema   . Arthritis    knee  . Asthma    Cough variant  . Asthma   . Asthma   . Back pain   . Chronic pain syndrome 06/08/2013  . DVT (deep venous thrombosis) (HCC) 10 yrs ago  . Dyslipidemia 08/11/2012  . Eczema   . Fractures   . GERD (gastroesophageal reflux disease)   . Heart murmur    as a child  . Hyperlipidemia   . IBS (irritable bowel syndrome)   . 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 Pneumonia   . Shortness of breath    SOB even without exertion at times  . Sickle-cell trait [facility])     Past Surgical History:  Procedure Laterality Date  . ABDOMINAL HYSTERECTOMY  4-5 yrs ago  . ABDOMINAL HYSTERECTOMY    . blood clot removed from lower abdomen  2000  . CHOLECYSTECTOMY  11/12/2012   Procedure: LAPAROSCOPIC CHOLECYSTECTOMY;  Surgeon: Dalia Heading, MD;  Location: [facility] ORS;  Service: General;  Laterality: N/A;  . CHOLECYSTECTOMY     . COLONOSCOPY  11/04/2012   Procedure: COLONOSCOPY;  Surgeon: Malissa Hippo, MD;  [facility];  Service: Endoscopy;  Laterality: N/A;  915  . cyst removed from ovary and fluid removed from tubes  2000  . ESOPHAGOGASTRODUODENOSCOPY  10/22/2012   Procedure: ESOPHAGOGASTRODUODENOSCOPY (EGD);  Surgeon: Malissa Hippo, MD;  [facility];  Service: Endoscopy;  Laterality: N/A;  100  . FRACTURE SURGERY    . HARDWARE REMOVAL Left 02/21/2016   Procedure: HARDWARE REMOVAL LEFT SACROILIAC JOINT;  Surgeon: Myrene Galas, MD;  Location: [facility] OR;  Service: Orthopedics;  Laterality: Left;  . ORIF PELVIC FRACTURE N/A 07/30/2015   Procedure: OPEN REDUCTION INTERNAL FIXATION (ORIF) PELVIC FRACTURE;  Surgeon: Myrene Galas, MD;  Location: [facility] OR;  Service: Orthopedics;  Laterality: N/A;  . PARTIAL HYSTERECTOMY  2009  . SACRO-ILIAC PINNING Right 07/30/2015   Procedure: Loyal Gambler;  Surgeon: Myrene Galas, MD;  Location: [facility] OR;  Service: Orthopedics;  Laterality: Right;  . SACRO-ILIAC PINNING Left 09/04/2015   Procedure: LEFT TO RIGHT TRANS SACRO-ILIAC SCREW;  Surgeon: Myrene Galas, MD;  Location: [facility] OR;  Service: Orthopedics;  Laterality: Left;  . SACROILIAC  JOINT FUSION Left 02/21/2016   Procedure: LEFT SACROILIAC JOINT FUSION;  Surgeon: Myrene Galas, MD;  [facility] OR;  Service: Orthopedics;  Laterality: Left;  . TUBAL LIGATION  1999    There were no vitals filed for this visit.  Subjective Assessment - 05/10/18 0824    Subjective  Pt reports she f/u at [facility] who wants her to continue therapy and potentially trial dry needling for her LBP. She is also going to refer her to a spine specialist to see if pt's back pain requires surgical intervention. The NP she saw at [facility] did not have her imaging on file so the pt had it sent to her and once the NP looks at it, she will call the patient regarding what she sees; pending that will determine if the pt returns to work. L side back  pain is not as bad today, but her R side is about a 5/10 currently.    Patient Stated Goals  decrease back pain    Currently in Pain?  Yes    Pain Score  5     Pain Location  Back    Pain Orientation  Right;Lower    Pain Descriptors / Indicators  Dull;Aching    Pain Type  Chronic pain    Pain Onset  More than a month ago    Pain Frequency  Constant    Aggravating Factors   standing, walking    Pain Relieving Factors  sitting, rest     Effect of Pain on Daily Activities  restricting everything           [facility] Adult PT Treatment/Exercise - 05/10/18 0001      Lumbar Exercises: Stretches   Passive Hamstring Stretch  Right;Left;2 reps;30 seconds;Limitations    Passive Hamstring Stretch Limitations  supine with rope    Other Lumbar Stretch Exercise  seated lumbar flexion stretch with medium physioball 10x10" forward, left and right      Manual Therapy   Manual Therapy  Soft tissue mobilization    Manual therapy comments  manual complete separate than rest of tx    Soft tissue mobilization  STM bil lumbar paraspinals and QL with pt in sidelying, R>L today           PT Education - 05/10/18 0833    Education Details  exercise technique, may attempt dry needling next session    Person(s) Educated  Patient    Methods  Explanation;Demonstration    Comprehension  Verbalized understanding;Returned demonstration       PT Short Term Goals - 04/30/18 1502      PT SHORT TERM GOAL #1   Title  Pt will have 1/2 grade improvement in MMT throughout proximal hips in order to decrease pain.    Time  3    Period  Weeks    Status  On-going      PT SHORT TERM GOAL #2   Title  Pt will have improved L SLS to 60 sec without pain to demo improved core strength.    Time  3    Period  Weeks    Status  On-going      PT SHORT TERM GOAL #3   Title  Pt will have improved 30 sec chair rise test to 11x  without pain in order to demo improved balance and functional strength.    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    Time  6    Period  Weeks    Status  On-going      PT LONG TERM GOAL #2   Title  Pt will have improved 30 sec chair rise test to 13x without pain to demo improved functional and core strength.    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    Time  6    Period  Weeks    Status  On-going      PT LONG TERM GOAL #4   Title  Pt will report being able to sleep through the night without awakening due to LBP in order to maximize her recovery.    Time  6    Period  Weeks    Status  On-going            Plan - 05/10/18 0857    Clinical Impression Statement  Pt presents to therapy after [facility] and sacral pain. Pt states that the NP told her once she sees her imaging of her spine, she will call her and let her know if she will let her return to work or not. This NP also recommended that she continue therapy and focus on core strength, stretching, and to consider employing McKenzie method and/or dry needling. Session limited as pt arrived late. Session focused on addressing pain and spasms in back with stretching and manual STM. Pt reported feeling better at EOS. Pt due for reassessment next visit.    Rehab Potential  Good    PT Frequency  3x / week    PT Duration  6 weeks    PT Treatment/Interventions  ADLs/Self Care Home Management;Aquatic Therapy;Cryotherapy;Electrical Stimulation;Moist Heat;Ultrasound;DME Instruction;Gait training;Stair training;Functional mobility training;Therapeutic  activities;Therapeutic exercise;Balance training;Neuromuscular re-education;Patient/family education;Manual techniques;Scar mobilization;Passive range of motion;Dry needling;Energy conservation;Taping    PT Next Visit Plan  reassessment; consider dry needling for lumbar paraspinals/multifidis; Continue core, functional, BLE strengthening, flexbility, and manual for soft tissue restrictions.      PT Home Exercise Plan  eval: DKTC; 04/30/18: Decompression exercises level 1 x 10 3'' holds 1x/day; 7/26: decomp wtih theraband    Consulted and Agree with Plan of Care  Patient       Patient will benefit from skilled therapeutic intervention in order to improve the following deficits and impairments:  Abnormal gait, Decreased activity tolerance, Decreased balance, Decreased endurance, Decreased mobility, Decreased range of motion, Decreased strength, Difficulty walking, Hypomobility, Increased fascial restricitons, Increased muscle spasms, Impaired flexibility, Improper body mechanics, Pain  Visit Diagnosis: Acute bilateral low back pain without sciatica  Muscle weakness (generalized)  Other symptoms and signs involving the musculoskeletal system     Problem List Patient Active Problem List   Diagnosis Date Noted  . Moderate persistent asthma, uncomplicated 09/24/2017  . Seasonal and perennial allergic rhinitis 09/24/2017  . SI joint arthritis 02/21/2016  .  SI (sacroiliac) joint dysfunction 09/04/2015  . Fall from horse 07/30/2015  . Lumbar transverse process fracture (HCC) 07/30/2015  . Pelvic ring fracture (HCC) 07/28/2015  . Primary generalized (osteo)arthritis 03/13/2015  . Asthma 11/17/2014  . Gastroesophageal reflux disease 11/17/2014  . Vitamin D deficiency 11/04/2014  . Dyslipidemia 08/11/2012  . 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 Allergic rhinitis 02/19/2010        Jac Canavan PT, DPT  [facility] [address] [place],  [place], [address] Phone: [phone]   Fax:  [phone]  Name: Jodi [name] MRN: [id number] Date of Birth: Sep 14, 1976

## 2018-05-12 ENCOUNTER — Telehealth: Payer: Self-pay | Admitting: Orthopaedic Surgery

## 2018-05-12 ENCOUNTER — Ambulatory Visit (HOSPITAL_COMMUNITY): Payer: 59

## 2018-05-12 ENCOUNTER — Encounter: Payer: Self-pay | Admitting: Orthopaedic Surgery

## 2018-05-12 ENCOUNTER — Encounter (HOSPITAL_COMMUNITY): Payer: Self-pay

## 2018-05-12 DIAGNOSIS — M79643 Pain in unspecified hand: Secondary | ICD-10-CM | POA: Diagnosis not present

## 2018-05-12 DIAGNOSIS — M6281 Muscle weakness (generalized): Secondary | ICD-10-CM

## 2018-05-12 DIAGNOSIS — M545 Low back pain, unspecified: Secondary | ICD-10-CM

## 2018-05-12 DIAGNOSIS — M1990 Unspecified osteoarthritis, unspecified site: Secondary | ICD-10-CM | POA: Diagnosis not present

## 2018-05-12 DIAGNOSIS — M7989 Other specified soft tissue disorders: Secondary | ICD-10-CM | POA: Diagnosis not present

## 2018-05-12 DIAGNOSIS — R29898 Other symptoms and signs involving the musculoskeletal system: Secondary | ICD-10-CM

## 2018-05-12 NOTE — Telephone Encounter (Signed)
Call received from patient today, requests out of work note. Patient verified that she has seen the specialist at [facility] to whom Dr Hilda LiasKeeling referred, Fatima SangerHeather Carroll, PA, 05/07/18 as scheduled. States that she was not issued out of work note as the provider relayed she does not issue out of work for back, without surgery, etc. York SpanielSaid was offered a note to return with restriction of taking breaks every 3 hours, which patient said would not work at her job due to prolonged standing.  Patient is asking if Dr Hilda LiasKeeling would approve out of work until she sees a specialist in September? Ph# [phone]

## 2018-05-12 NOTE — Telephone Encounter (Signed)
Called patient to notify. Report has been received and reviewed by Dr Hilda LiasKeeling; out of work note is approved until [facility] appointment [facility] 07/08/[facility]. May pick up note.

## 2018-05-12 NOTE — Therapy (Signed)
[place], [place], [address] Phone: [phone]   Fax:  [phone]  Physical Therapy Treatment  Patient Details  Name: Jodi [name] MRN: [id number] Date of Birth: 1976-01-03 Referring Provider: Sanjuana Kava, MD   Encounter Date: 05/12/2018  PT End of Session - 05/12/18 0818    Visit Number  10    Number of Visits  19    Date for PT Re-Evaluation  06/01/18 Minireassess 05/12/18    Authorization Type  [employer]    Authorization Time Period  04/20/18 to 06/01/18    Authorization - Visit Number  10    Authorization - Number of Visits  60    PT Start Time  8413    PT Stop Time  2440    PT Time Calculation (min)  40 min    Activity Tolerance  Patient tolerated treatment well;No increased pain    Behavior During Therapy  WFL for tasks assessed/performed       Past Medical History:  Diagnosis Date  . Abdominal pain, chronic, right upper quadrant   . Acute recurrent maxillary sinusitis 11/04/2014  . Allergic rhinitis   . Anemia   . Angio-edema   . Arthritis    knee  . Asthma    Cough variant  . Asthma   . Asthma   . Back pain   . Chronic pain syndrome 06/08/2013  . DVT (deep venous thrombosis) ([place]) 10 yrs ago  . Dyslipidemia 08/11/2012  . Eczema   . Fractures   . GERD (gastroesophageal reflux disease)   . Heart murmur    as a child  . Hyperlipidemia   . IBS (irritable bowel syndrome)   . 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 Pneumonia   . Shortness of breath    SOB even without exertion at times  . Sickle-cell trait ([facility]     Past Surgical History:  Procedure Laterality Date  . ABDOMINAL HYSTERECTOMY  4-5 yrs ago  . ABDOMINAL HYSTERECTOMY    . blood clot removed from lower abdomen  2000  . CHOLECYSTECTOMY  11/12/2012   Procedure: LAPAROSCOPIC CHOLECYSTECTOMY;  Surgeon: MJamesetta So MD;  Location: [facility];  Service: General;  Laterality: N/A;  . CHOLECYSTECTOMY    .  COLONOSCOPY  11/04/2012   Procedure: COLONOSCOPY;  Surgeon: NRogene Houston MD;  [facility];  Service: Endoscopy;  Laterality: N/A;  915  . cyst removed from ovary and fluid removed from tubes  2000  . ESOPHAGOGASTRODUODENOSCOPY  10/22/2012   Procedure: ESOPHAGOGASTRODUODENOSCOPY (EGD);  Surgeon: NRogene Houston MD;  [facility];  Service: Endoscopy;  Laterality: N/A;  100  . FRACTURE SURGERY    . HARDWARE REMOVAL Left 02/21/2016   Procedure: HARDWARE REMOVAL LEFT SACROILIAC JOINT;  Surgeon: MAltamese Smiths Ferry MD;  [place]  Service: Orthopedics;  Laterality: Left;  . ORIF PELVIC FRACTURE N/A 07/30/2015   Procedure: OPEN REDUCTION INTERNAL FIXATION (ORIF) PELVIC FRACTURE;  Surgeon: MAltamese Loreauville MD;  [place]  Service: Orthopedics;  Laterality: N/A;  . PARTIAL HYSTERECTOMY  2009  . SACRO-ILIAC PINNING Right 07/30/2015   Procedure: SDub Mikes  Surgeon: MAltamese [place] MD;  [place]  Service: Orthopedics;  Laterality: Right;  . SACRO-ILIAC PINNING Left 09/04/2015   Procedure: LEFT TO RIGHT TRANS SACRO-ILIAC SCREW;  Surgeon: MAltamese Boykin MD;  [place] [facility]  Service: Orthopedics;  Laterality: Left;  . SACROILIAC JOINT FUSION  Left 02/21/2016   Procedure: LEFT SACROILIAC JOINT FUSION;  Surgeon: Altamese Goldonna, MD;  Location: [place];  Service: Orthopedics;  Laterality: Left;  . TUBAL LIGATION  1999    There were no vitals filed for this visit.  Subjective Assessment - 05/12/18 0820    Subjective  Pt reports that the NP at [facility] told her that she didn't see anything on the MRI so they are going to refer her to a spine specialist at [facility] in September for more imaging. She staets that she brushed her horse yesterday and her back is hurting from it.    Patient Stated Goals  decrease back pain    Currently in Pain?  Yes    Pain Score  5     Pain Location  Back    Pain Orientation  Right;Lower    Pain Descriptors / Indicators  Aching;Dull    Pain  Type  Chronic pain    Pain Onset  More than a month ago    Pain Frequency  Constant    Aggravating Factors   standing, walking    Pain Relieving Factors  sitting, rest    Effect of Pain on Daily Activities  restricting everything         [facility] PT Assessment - 05/12/18 0001      Assessment   Medical Diagnosis  chronic midline low back pain without sciatica, pelvic pain    Referring Provider  Sanjuana Kava, MD    Onset Date/Surgical Date  03/23/18 surgery in 2016 and 2017; Dr. Marcelino Scot performed all 3 surgerie    Next MD Visit  calling today about next appiontment    Prior Therapy  yes for back in 2016-2017      Observation/Other Assessments   Focus on Therapeutic Outcomes (FOTO)   59% limited was 72% limitation      Functional Tests   Functional tests  Sit to Stand      Sit to Stand   Comments  30 sec chair rise test: 11x was 9, painful      Strength   Right Hip Extension  4+/5 was 4+    Right Hip ABduction  4+/5 was 4+    Left Hip Flexion  5/5 was 4+    Left Hip Extension  4/5 was 4    Left Hip ABduction  4+/5 was 4+    Left Knee Flexion  4+/5 in prone, was 4+      Ambulation/Gait   Ambulation Distance (Feet)  580 Feet was 591f    Assistive device  None    Gait Pattern  Step-through pattern;Decreased dorsiflexion - right;Decreased dorsiflexion - left;Antalgic;Trendelenburg;Wide base of support increased lumbar rotation and decreased pelvic rotation bil      Balance   Balance Assessed  Yes      Static Standing Balance   Static Standing - Balance Support  No upper extremity supported    Static Standing Balance -  Activities   Single Leg Stance - Left Leg    Static Standing - Comment/# of Minutes  L: 1:00 was 44sec           [facility] Adult PT Treatment/Exercise - 05/12/18 0001      Lumbar Exercises: Stretches   Other Lumbar Stretch Exercise  seated lumbar flexion stretch 5x10"      Lumbar Exercises: Standing   Other Standing Lumbar Exercises  RFISx10 (increased  LBP); REIS x10 (increased pain); Repeated L LF in standing x10 reps (caused L sided spasm); Repeated  R LF in standing x20 reps (no change in symptoms after first 10 reps then 10 it slightly decreased L sided spasm)             PT Education - 05/12/18 0819    Education Details  reassessment findings; dry needling handout    Person(s) Educated  Patient    Methods  Explanation;Demonstration;Handout    Comprehension  Verbalized understanding;Returned demonstration       PT Short Term Goals - 05/12/18 4098      PT SHORT TERM GOAL #1   Title  Pt will have 1/2 grade improvement in MMT throughout proximal hips in order to decrease pain.    Baseline  7/31: see MMT    Time  3    Period  Weeks    Status  On-going      PT SHORT TERM GOAL #2   Title  Pt will have improved L SLS to 60 sec without pain to demo improved core strength.    Time  3    Period  Weeks    Status  Achieved      PT SHORT TERM GOAL #3   Title  Pt will have improved 30 sec chair rise test to 11x  without pain in order to demo improved balance and functional strength.    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    Baseline  7/31: 5816f   Time  6    Period  Weeks    Status  On-going      PT LONG TERM GOAL #2   Title  Pt will have improved 30 sec chair rise test to 13x without pain to demo improved functional and core strength.    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    Baseline  7/31:  reports her tolerance for standing and walking is about the same as when she started (10 mins standing, 2053m walking)    Time  6    Period  Weeks    Status  On-going      PT LONG TERM GOAL #4   Title  Pt will report being able to sleep through the night without awakening due to LBP in order to maximize her recovery.    Baseline  7/31: still wakes up with LBP about 2x/night, rolling over is what gets her, but she reports her sleeping has improved    Time  6    Period  Weeks    Status  On-going            Plan - 05/12/18 [id number] Clinical Impression Statement  PT reassessed pt's goals and outcome measures this date. Pt has made  good progress towards goals as illustrated above. She has met 2/3 STG and has made good progress towards her LTG. Pt is still limited with LBP and "muscle spasms" which limits her daily activities, functional tasks, and functional mobility. Pt does feel that therapy has helped her pain decrease a little. Overall, pt has made good progress towards goals and needs continued skilled PT intervention to continue to address her limitations so as to decrease pain and maximize return to PLOF. Performed multiple repeated movements this date without great success. She continues to demo deficits in lumbar flexion AROM as she is in lordosis for almost all of her lumbar ROM/movements. Seated lumbar flexion stretch helped decrease her pain some at EOS. Will attempt dry needling next visit so as to decrease trigger points throughout paraspinals and multifidis bilaterally so as to decrease her pain and maximize her mobility.    Rehab Potential  Good    PT Frequency  3x / week    PT Duration  6 weeks    PT Treatment/Interventions  ADLs/Self Care Home Management;Aquatic Therapy;Cryotherapy;Electrical Stimulation;Moist Heat;Ultrasound;DME Instruction;Gait training;Stair training;Functional mobility training;Therapeutic activities;Therapeutic exercise;Balance training;Neuromuscular  re-education;Patient/family education;Manual techniques;Scar mobilization;Passive range of motion;Dry needling;Energy conservation;Taping    PT Next Visit Plan  dry needling for bil lumbar paraspinals/multifidis; Continue core, functional, BLE strengthening, flexbility, and manual for soft tissue restrictions.      PT Home Exercise Plan  eval: DKTC; 04/30/18: Decompression exercises level 1 x 10 3'' holds 1x/day; 7/26: decomp wtih theraband    Consulted and Agree with Plan of Care  Patient       Patient will benefit from skilled therapeutic intervention in order to improve the following deficits and impairments:  Abnormal gait, Decreased activity tolerance, Decreased balance, Decreased endurance, Decreased mobility, Decreased range of motion, Decreased strength, Difficulty walking, Hypomobility, Increased fascial restricitons, Increased muscle spasms, Impaired flexibility, Improper body mechanics, Pain  Visit Diagnosis: Acute bilateral low back pain without sciatica  Muscle weakness (generalized)  Other symptoms and signs involving the musculoskeletal system     Problem List Patient Active Problem List   Diagnosis Date Noted  . Moderate persistent asthma, uncomplicated 00/37/0488  . Seasonal and perennial allergic rhinitis 09/24/2017  . SI joint arthritis 02/21/2016  . SI (sacroiliac) joint dysfunction 09/04/2015  . Fall from horse 07/30/2015  . Lumbar transverse process fracture ([place]) 07/30/2015  . Pelvic ring fracture ([place]) 07/28/2015  . Primary generalized (osteo)arthritis 03/13/2015  . Asthma 11/17/2014  . Gastroesophageal reflux disease 11/17/2014  . Vitamin D deficiency 11/04/2014  . Dyslipidemia 08/11/2012  . 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 Allergic rhinitis 02/19/2010       Geraldine Solar PT, DPT  [facility] [address] [place], [place], [address] Phone: [phone]   Fax:  [phone]  Name: Jodi [name] MRN: [id number] Date of Birth: January 31, 1976

## 2018-05-12 NOTE — Patient Instructions (Signed)

## 2018-05-12 NOTE — Telephone Encounter (Signed)
I need to have copy of the [facility] Doctor's note first.  Who is she seeing in September?

## 2018-05-12 NOTE — Telephone Encounter (Signed)
Called patient; voiced understanding. Patient relays appointment with spine specialist, [place] MechanicMichael Ingram, MD is 07/08/18, per referral by Tessa LernerHeather Carroll,PA.  I followed up with [facility], medical records department regarding report/consultation, per Vikki PortsValerie - report is being faxed.

## 2018-05-14 ENCOUNTER — Ambulatory Visit (HOSPITAL_COMMUNITY): Payer: 59 | Attending: Orthopaedic Surgery

## 2018-05-14 ENCOUNTER — Encounter (HOSPITAL_COMMUNITY): Payer: Self-pay

## 2018-05-14 DIAGNOSIS — M545 Low back pain, unspecified: Secondary | ICD-10-CM

## 2018-05-14 DIAGNOSIS — M6281 Muscle weakness (generalized): Secondary | ICD-10-CM

## 2018-05-14 DIAGNOSIS — R29898 Other symptoms and signs involving the musculoskeletal system: Secondary | ICD-10-CM | POA: Diagnosis present

## 2018-05-14 NOTE — Therapy (Signed)
[place] [facility] [address] [place], [place], [address] Phone: [phone]   Fax:  [phone]  Physical Therapy Treatment  Patient Details  Name: LAREN ORAMA MRN: [id number] Date of Birth: 1976-03-11 Referring Provider: Darreld Mclean, MD   Encounter Date: 05/14/2018  PT End of Session - 05/14/18 0818    Visit Number  11    Number of Visits  19    Date for PT Re-Evaluation  06/01/18 Minireassess 05/12/18    Authorization Type  [employer]    Authorization Time Period  04/20/18 to 06/01/18    Authorization - Visit Number  11    Authorization - Number of Visits  60    PT Start Time  0817    PT Stop Time  0857    PT Time Calculation (min)  40 min    Activity Tolerance  Patient tolerated treatment well;No increased pain    Behavior During Therapy  WFL for tasks assessed/performed       Past Medical History:  Diagnosis Date  . Abdominal pain, chronic, right upper quadrant   . Acute recurrent maxillary sinusitis 11/04/2014  . Allergic rhinitis   . Anemia   . Angio-edema   . Arthritis    knee  . Asthma    Cough variant  . Asthma   . Asthma   . Back pain   . Chronic pain syndrome 06/08/2013  . DVT (deep venous thrombosis) (HCC) 10 yrs ago  . Dyslipidemia 08/11/2012  . Eczema   . Fractures   . GERD (gastroesophageal reflux disease)   . Heart murmur    as a child  . Hyperlipidemia   . IBS (irritable bowel syndrome)   . 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 Pneumonia   . Shortness of breath    SOB even without exertion at times  . Sickle-cell trait [facility])     Past Surgical History:  Procedure Laterality Date  . ABDOMINAL HYSTERECTOMY  4-5 yrs ago  . ABDOMINAL HYSTERECTOMY    . blood clot removed from lower abdomen  2000  . CHOLECYSTECTOMY  11/12/2012   Procedure: LAPAROSCOPIC CHOLECYSTECTOMY;  Surgeon: Dalia Heading, MD;  Location: [facility];  Service: General;  Laterality: N/A;  . CHOLECYSTECTOMY    .  COLONOSCOPY  11/04/2012   Procedure: COLONOSCOPY;  Surgeon: Malissa Hippo, MD;  [facility];  Service: Endoscopy;  Laterality: N/A;  915  . cyst removed from ovary and fluid removed from tubes  2000  . ESOPHAGOGASTRODUODENOSCOPY  10/22/2012   Procedure: ESOPHAGOGASTRODUODENOSCOPY (EGD);  Surgeon: Malissa Hippo, MD;  [facility]: [facility];  Service: Endoscopy;  Laterality: N/A;  100  . FRACTURE SURGERY    . HARDWARE REMOVAL Left 02/21/2016   Procedure: HARDWARE REMOVAL LEFT SACROILIAC JOINT;  Surgeon: Myrene Galas, MD;  Location: [facility] OR;  Service: Orthopedics;  Laterality: Left;  . ORIF PELVIC FRACTURE N/A 07/30/2015   Procedure: OPEN REDUCTION INTERNAL FIXATION (ORIF) PELVIC FRACTURE;  Surgeon: Myrene Galas, MD;  Location: [facility] OR;  Service: Orthopedics;  Laterality: N/A;  . PARTIAL HYSTERECTOMY  2009  . SACRO-ILIAC PINNING Right 07/30/2015   Procedure: Loyal Gambler;  Surgeon: Myrene Galas, MD;  Location: [facility] OR;  Service: Orthopedics;  Laterality: Right;  . SACRO-ILIAC PINNING Left 09/04/2015   Procedure: LEFT TO RIGHT TRANS SACRO-ILIAC SCREW;  Surgeon: Myrene Galas, MD;  Location: [facility] OR;  Service: Orthopedics;  Laterality: Left;  . SACROILIAC JOINT FUSION  Left 02/21/2016   Procedure: LEFT SACROILIAC JOINT FUSION;  Surgeon: Myrene GalasMichael Handy, MD;  [facility] OR;  Service: Orthopedics;  Laterality: Left;  . TUBAL LIGATION  1999    There were no vitals filed for this visit.  Subjective Assessment - 05/14/18 0819    Subjective  Pt states that she is a little tight today. She has a CT scan next Tuesday evening.     Patient Stated Goals  decrease back pain    Currently in Pain?  Yes    Pain Score  4     Pain Location  Back    Pain Orientation  Right;Lower    Pain Descriptors / Indicators  Aching;Dull    Pain Type  Chronic pain    Pain Onset  More than a month ago    Pain Frequency  Constant    Aggravating Factors   standing, walking    Pain Relieving Factors  sitting,  rest    Effect of Pain on Daily Activities  restricting everything            [facility] Adult PT Treatment/Exercise - 05/14/18 0001      Manual Therapy   Manual Therapy  Soft tissue mobilization    Manual therapy comments  manual complete separate than rest of tx    Soft tissue mobilization  STM to bil lumbar paraspinals after needling for decreasd pain and restrictions       Trigger Point Dry Needling - 05/14/18 0834    Consent Given?  Yes    Education Handout Provided  Yes at last visit    Muscles Treated Lower Body  Adductor longus/brevius/maximus    Adductor Response  Twitch response elicited;Palpable increased muscle length bil lumbar multifidis L3-4 in prone           PT Education - 05/14/18 0834    Education Details  trigger point dry needling, will likely be sore    Person(s) Educated  Patient    Methods  Explanation;Demonstration    Comprehension  Verbalized understanding;Returned demonstration       PT Short Term Goals - 05/12/18 16100822      PT SHORT TERM GOAL #1   Title  Pt will have 1/2 grade improvement in MMT throughout proximal hips in order to decrease pain.    Baseline  7/31: see MMT    Time  3    Period  Weeks    Status  On-going      PT SHORT TERM GOAL #2   Title  Pt will have improved L SLS to 60 sec without pain to demo improved core strength.    Time  3    Period  Weeks    Status  Achieved      PT SHORT TERM GOAL #3   Title  Pt will have improved 30 sec chair rise test to 11x  without pain in order to demo improved balance and functional strength.    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    Baseline  7/31: 56180ft    Time  6    Period  Weeks    Status  On-going      PT LONG TERM  GOAL #2   Title  Pt will have improved 30 sec chair rise test to 13x without pain to demo improved functional and core strength.    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    Baseline  7/31: reports her tolerance for standing and walking is about the same as when she started (10 mins standing, walking)    Time  6    Period  Weeks    Status  On-going      PT LONG TERM GOAL #4   Title  Pt will report being able to sleep through the night without awakening due to LBP in order to maximize her recovery.    Baseline  7/31: still wakes up with LBP about 2x/night, rolling over is what gets her, but she reports her sleeping has improved    Time  6    Period  Weeks    Status  On-going            Plan - 05/14/18 0859    Clinical Impression Statement  Initiated trigger point dry needling this visit. Performed dry needling to bil lumbar multifidus and left needles in for 5 mins to allow for muscle relaxation. Pt reporting most tender/pain on the R side during needling. Applied x4 needles to bil L3-4 multifidus for 5 mins and then x2 needles at bil L4 multifidus x ; pt noted to be very tight on R side throughout. Ended with manual STM for continued muscle relaxation and pain control. Educated that she will likely be sore following needling but that this is a normal response. Continue with core/functional/BLE strengthening as able along with manual techniques to decreased pain and soft tissue restrictions. Pt reported decreased pain on L side to 1-2/10 but R side of lower back remained around 4/10 at EOS.     Rehab Potential  Good    PT Frequency  3x / week    PT Duration  6 weeks    PT Treatment/Interventions  ADLs/Self Care  Home Management;Aquatic Therapy;Cryotherapy;Electrical Stimulation;Moist Heat;Ultrasound;DME Instruction;Gait training;Stair training;Functional mobility training;Therapeutic activities;Therapeutic exercise;Balance training;Neuromuscular re-education;Patient/family education;Manual techniques;Scar mobilization;Passive range of motion;Dry needling;Energy conservation;Taping    PT Next Visit Plan  Continue core, functional, BLE strengthening, flexbility, and manual for soft tissue restrictions. resume dry needling for bil lumbar paraspinals/multifidus when able    PT Home Exercise Plan  eval: DKTC; 04/30/18: Decompression exercises level 1 x 10 3'' holds 1x/day; 7/26: decomp wtih theraband    Consulted and Agree with Plan of Care  Patient       Patient will benefit from skilled therapeutic intervention in order to improve the following deficits and impairments:  Abnormal gait, Decreased activity tolerance, Decreased balance, Decreased endurance, Decreased mobility, Decreased range of motion, Decreased strength, Difficulty walking, Hypomobility, Increased fascial restricitons, Increased muscle spasms, Impaired flexibility, Improper body mechanics, Pain  Visit Diagnosis: Acute bilateral low back pain without sciatica  Muscle weakness (generalized)  Other symptoms and signs involving the musculoskeletal system  Problem List Patient Active Problem List   Diagnosis Date Noted  . Moderate persistent asthma, uncomplicated 09/24/2017  . Seasonal and perennial allergic rhinitis 09/24/2017  . SI joint arthritis 02/21/2016  . SI (sacroiliac) joint dysfunction 09/04/2015  . Fall from horse 07/30/2015  . Lumbar transverse process fracture (HCC) 07/30/2015  . Pelvic ring fracture (HCC) 07/28/2015  . Primary generalized (osteo)arthritis 03/13/2015  . Asthma 11/17/2014  . Gastroesophageal reflux disease 11/17/2014  . Vitamin D deficiency 11/04/2014  . Dyslipidemia 08/11/2012  . Lung granuloma (HCC)  07/22/2011  . Allergic rhinitis 02/19/2010       Jac Canavan PT, DPT  [place] [facility] [address] [place], [place], [address] Phone: [phone]   Fax:  [phone]  Name: AI SONNENFELD MRN: [id number] Date of Birth: 10-Dec-1975

## 2018-05-18 DIAGNOSIS — M25551 Pain in right hip: Secondary | ICD-10-CM | POA: Diagnosis not present

## 2018-05-18 DIAGNOSIS — M25552 Pain in left hip: Secondary | ICD-10-CM | POA: Diagnosis not present

## 2018-05-18 DIAGNOSIS — S32810D Multiple fractures of pelvis with stable disruption of pelvic ring, subsequent encounter for fracture with routine healing: Secondary | ICD-10-CM | POA: Diagnosis not present

## 2018-05-18 DIAGNOSIS — S32048A Other fracture of fourth lumbar vertebra, initial encounter for closed fracture: Secondary | ICD-10-CM | POA: Diagnosis not present

## 2018-05-19 ENCOUNTER — Ambulatory Visit (HOSPITAL_COMMUNITY): Payer: 59 | Admitting: Physical Therapy

## 2018-05-19 DIAGNOSIS — M6281 Muscle weakness (generalized): Secondary | ICD-10-CM

## 2018-05-19 DIAGNOSIS — M545 Low back pain, unspecified: Secondary | ICD-10-CM

## 2018-05-19 DIAGNOSIS — R29898 Other symptoms and signs involving the musculoskeletal system: Secondary | ICD-10-CM

## 2018-05-19 NOTE — Therapy (Signed)
[place] [facility] [address] [place], [place], [address] Phone: [phone]   Fax:  [phone]  Physical Therapy Treatment  Patient Details  Name: Jodi [name] MRN: [id number] Date of Birth: March 23, 1976 Referring Provider: Darreld Mclean, MD   Encounter Date: 05/19/2018  PT End of Session - 05/19/18 0859    Visit Number  12    Number of Visits  19    Date for PT Re-Evaluation  06/01/18 Minireassess 05/12/18    Authorization Type  [employer]    Authorization Time Period  04/20/18 to 06/01/18    Authorization - Visit Number  12    Authorization - Number of Visits  60    PT Start Time  0825    PT Stop Time  0903    PT Time Calculation (min)  38 min    Activity Tolerance  Patient tolerated treatment well;No increased pain    Behavior During Therapy  WFL for tasks assessed/performed       Past Medical History:  Diagnosis Date  . Abdominal pain, chronic, right upper quadrant   . Acute recurrent maxillary sinusitis 11/04/2014  . Allergic rhinitis   . Anemia   . Angio-edema   . Arthritis    knee  . Asthma    Cough variant  . Asthma   . Asthma   . Back pain   . Chronic pain syndrome 06/08/2013  . DVT (deep venous thrombosis) (HCC) 10 yrs ago  . Dyslipidemia 08/11/2012  . Eczema   . Fractures   . GERD (gastroesophageal reflux disease)   . Heart murmur    as a child  . Hyperlipidemia   . IBS (irritable bowel syndrome)   . 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 Pneumonia   . Shortness of breath    SOB even without exertion at times  . Sickle-cell trait [facility])     Past Surgical History:  Procedure Laterality Date  . ABDOMINAL HYSTERECTOMY  4-5 yrs ago  . ABDOMINAL HYSTERECTOMY    . blood clot removed from lower abdomen  2000  . CHOLECYSTECTOMY  11/12/2012   Procedure: LAPAROSCOPIC CHOLECYSTECTOMY;  Surgeon: Dalia Heading, MD;  Location: [facility];  Service: General;  Laterality: N/A;  . CHOLECYSTECTOMY    .  COLONOSCOPY  11/04/2012   Procedure: COLONOSCOPY;  Surgeon: Malissa Hippo, MD;  [facility];  Service: Endoscopy;  Laterality: N/A;  915  . cyst removed from ovary and fluid removed from tubes  2000  . ESOPHAGOGASTRODUODENOSCOPY  10/22/2012   Procedure: ESOPHAGOGASTRODUODENOSCOPY (EGD);  Surgeon: Malissa Hippo, MD;  [facility];  Service: Endoscopy;  Laterality: N/A;  100  . FRACTURE SURGERY    . HARDWARE REMOVAL Left 02/21/2016   Procedure: HARDWARE REMOVAL LEFT SACROILIAC JOINT;  Surgeon: Myrene Galas, MD;  Location: [facility] OR;  Service: Orthopedics;  Laterality: Left;  . ORIF PELVIC FRACTURE N/A 07/30/2015   Procedure: OPEN REDUCTION INTERNAL FIXATION (ORIF) PELVIC FRACTURE;  Surgeon: Myrene Galas, MD;  Location: [facility] OR;  Service: Orthopedics;  Laterality: N/A;  . PARTIAL HYSTERECTOMY  2009  . SACRO-ILIAC PINNING Right 07/30/2015   Procedure: Loyal Gambler;  Surgeon: Myrene Galas, MD;  Location: [facility] OR;  Service: Orthopedics;  Laterality: Right;  . SACRO-ILIAC PINNING Left 09/04/2015   Procedure: LEFT TO RIGHT TRANS SACRO-ILIAC SCREW;  Surgeon: Myrene Galas, MD;  Location: [facility] OR;  Service: Orthopedics;  Laterality: Left;  . SACROILIAC JOINT FUSION  Left 02/21/2016   Procedure: LEFT SACROILIAC JOINT FUSION;  Surgeon: Myrene GalasMichael Handy, MD;  [facility];  Service: Orthopedics;  Laterality: Left;  . TUBAL LIGATION  1999    There were no vitals filed for this visit.  Subjective Assessment - 05/19/18 0829    Subjective  Pt 10 minutes late for appt. States she got a CT scan yesterday and it hurt.  STates she is bruised from the dry needling and it hurt as well.     Currently in Pain?  Yes    Pain Score  6     Pain Location  Back    Pain Orientation  Right;Left;Lower    Pain Descriptors / Indicators  Aching    Pain Type  Chronic pain                       [facility] Adult PT Treatment/Exercise - 05/19/18 0001      Lumbar Exercises: Stretches    Active Hamstring Stretch  Right;Left;3 reps;30 seconds;Limitations    Active Hamstring Stretch Limitations  12" box    Single Knee to Chest Stretch  Right;Left;3 reps;30 seconds    Lower Trunk Rotation Limitations  10x5" bil    Other Lumbar Stretch Exercise  seated lumbar flexion stretch with medium physioball 10x10" forward, left and right      Lumbar Exercises: Standing   Other Standing Lumbar Exercises  lumbar excursions 10 reps each    Other Standing Lumbar Exercises  Palov press GTB 10 reps each direction with each LE lead (40 total)      Lumbar Exercises: Supine   Clam  10 reps;Limitations    Clam Limitations  with core stability               PT Short Term Goals - 05/12/18 16100822      PT SHORT TERM GOAL #1   Title  Pt will have 1/2 grade improvement in MMT throughout proximal hips in order to decrease pain.    Baseline  7/31: see MMT    Time  3    Period  Weeks    Status  On-going      PT SHORT TERM GOAL #2   Title  Pt will have improved L SLS to 60 sec without pain to demo improved core strength.    Time  3    Period  Weeks    Status  Achieved      PT SHORT TERM GOAL #3   Title  Pt will have improved 30 sec chair rise test to 11x  without pain in order to demo improved balance and functional strength.    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    Baseline  7/31: 53580ft    Time  6    Period  Weeks    Status  On-going      PT LONG TERM GOAL #2   Title  Pt will have improved 30 sec chair rise test to 13x without pain to demo improved functional and core strength.    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    Baseline  7/31: reports her tolerance for standing and walking is about the same as when she started (10 mins standing, walking)    Time  6    Period  Weeks    Status  On-going      PT LONG TERM GOAL #4   Title  Pt will report being able to sleep through the night without awakening due to LBP in order to maximize her recovery.    Baseline  7/31: still wakes up with LBP about 2x/night, rolling over is what gets her, but she reports her sleeping has improved    Time  6    Period  Weeks    Status  On-going            Plan - 05/19/18 0900    Clinical Impression Statement  PT late for appt.  Focused this session on core stability and stretches to relieve tightness in lumbar paraspinals.  Pt able to complete all therex without c/o pain, however did have some spasms while completing clams with core stab.  No noted area of redness/infection just brusiing 2X5cm at dry needling spot from last session.  Pt instructed to focus on stretching and core stab wtih HEP.    Rehab Potential  Good    PT Frequency  3x / week    PT Duration  6 weeks    PT Treatment/Interventions  ADLs/Self Care Home Management;Aquatic Therapy;Cryotherapy;Electrical Stimulation;Moist Heat;Ultrasound;DME Instruction;Gait training;Stair training;Functional mobility training;Therapeutic activities;Therapeutic exercise;Balance training;Neuromuscular re-education;Patient/family education;Manual techniques;Scar mobilization;Passive range of motion;Dry needling;Energy conservation;Taping    PT Next Visit Plan  Continue core, functional, BLE strengthening, flexbility, and manual for soft tissue restrictions. resume dry needling for bil lumbar paraspinals/multifidus when able    PT Home Exercise Plan  eval: DKTC; 04/30/18: Decompression exercises level 1 x 10  3'' holds 1x/day; 7/26: decomp wtih theraband    Consulted and Agree with Plan of Care  Patient       Patient will benefit from skilled therapeutic intervention in order to improve the following deficits and impairments:  Abnormal gait, Decreased activity tolerance, Decreased balance, Decreased endurance, Decreased mobility, Decreased range of motion, Decreased strength, Difficulty walking, Hypomobility, Increased fascial restricitons, Increased muscle spasms, Impaired flexibility, Improper body mechanics, Pain  Visit Diagnosis: Acute bilateral low back pain without sciatica  Muscle weakness (generalized)  Other symptoms and signs involving the musculoskeletal system     Problem List Patient Active Problem List   Diagnosis Date Noted  . Moderate persistent asthma, uncomplicated 09/24/2017  . Seasonal and perennial allergic rhinitis 09/24/2017  . SI joint arthritis 02/21/2016  . SI (sacroiliac) joint dysfunction 09/04/2015  . Fall from horse 07/30/2015  . Lumbar transverse process fracture (HCC) 07/30/2015  . Pelvic ring fracture (HCC) 07/28/2015  . Primary generalized (osteo)arthritis 03/13/2015  . Asthma 11/17/2014  . Gastroesophageal reflux disease 11/17/2014  . Vitamin D deficiency 11/04/2014  . Dyslipidemia 08/11/2012  . 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 Allergic rhinitis 02/19/2010   Lurena Nida, PTA/CLT [phone]  Bascom Levels, Jahnai Slingerland B 05/19/2018, 9:23 AM  [facility] [address] [place], [place], [address] Phone: [phone]   Fax:  [phone]  Name: AMARILIS BELFLOWER MRN: [id number] Date of Birth: 05/05/1976

## 2018-05-21 ENCOUNTER — Ambulatory Visit (HOSPITAL_COMMUNITY): Payer: 59

## 2018-05-21 ENCOUNTER — Telehealth (HOSPITAL_COMMUNITY): Payer: Self-pay | Admitting: Pulmonary Disease

## 2018-05-21 NOTE — Telephone Encounter (Signed)
05/21/18  pt called to say she can't find her dog so she won't be here - wants to add to the end of her schedule

## 2018-05-24 DIAGNOSIS — J45901 Unspecified asthma with (acute) exacerbation: Secondary | ICD-10-CM | POA: Diagnosis not present

## 2018-05-24 DIAGNOSIS — I10 Essential (primary) hypertension: Secondary | ICD-10-CM | POA: Diagnosis not present

## 2018-05-24 DIAGNOSIS — J301 Allergic rhinitis due to pollen: Secondary | ICD-10-CM | POA: Diagnosis not present

## 2018-05-25 ENCOUNTER — Ambulatory Visit (HOSPITAL_COMMUNITY): Payer: 59

## 2018-05-25 ENCOUNTER — Encounter (HOSPITAL_COMMUNITY): Payer: Self-pay

## 2018-05-25 DIAGNOSIS — M6281 Muscle weakness (generalized): Secondary | ICD-10-CM

## 2018-05-25 DIAGNOSIS — M545 Low back pain, unspecified: Secondary | ICD-10-CM

## 2018-05-25 DIAGNOSIS — R29898 Other symptoms and signs involving the musculoskeletal system: Secondary | ICD-10-CM

## 2018-05-25 NOTE — Therapy (Signed)
[facility] [address] [place], [place], [address] Phone: [phone]   Fax:  [phone]  Physical Therapy Treatment  Patient Details  Name: Jodi [name] MRN: [id number] Date of Birth: 1976/06/28 Referring Provider: Darreld Mclean, MD   Encounter Date: 05/25/2018  PT End of Session - 05/25/18 1446    Visit Number  13    Number of Visits  19    Date for PT Re-Evaluation  06/01/18   minireassess 05/12/18   Authorization Type  [employer]    Authorization Time Period  04/20/18 to 06/01/18    Authorization - Visit Number  13    Authorization - Number of Visits  60    PT Start Time  1438    PT Stop Time  1516    PT Time Calculation (min)  38 min    Activity Tolerance  Patient limited by pain;No increased pain;Patient tolerated treatment well    Behavior During Therapy  [facility] for tasks assessed/performed       Past Medical History:  Diagnosis Date  . Abdominal pain, chronic, right upper quadrant   . Acute recurrent maxillary sinusitis 11/04/2014  . Allergic rhinitis   . Anemia   . Angio-edema   . Arthritis    knee  . Asthma    Cough variant  . Asthma   . Asthma   . Back pain   . Chronic pain syndrome 06/08/2013  . DVT (deep venous thrombosis) (HCC) 10 yrs ago  . Dyslipidemia 08/11/2012  . Eczema   . Fractures   . GERD (gastroesophageal reflux disease)   . Heart murmur    as a child  . Hyperlipidemia   . IBS (irritable bowel syndrome)   . 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 Pneumonia   . Shortness of breath    SOB even without exertion at times  . Sickle-cell trait [facility])     Past Surgical History:  Procedure Laterality Date  . ABDOMINAL HYSTERECTOMY  4-5 yrs ago  . ABDOMINAL HYSTERECTOMY    . blood clot removed from lower abdomen  2000  . CHOLECYSTECTOMY  11/12/2012   Procedure: LAPAROSCOPIC CHOLECYSTECTOMY;  Surgeon: Dalia Heading, MD;  Location: [facility] ORS;  Service: General;  Laterality: N/A;   . CHOLECYSTECTOMY    . COLONOSCOPY  11/04/2012   Procedure: COLONOSCOPY;  Surgeon: Malissa Hippo, MD;  [facility];  Service: Endoscopy;  Laterality: N/A;  915  . cyst removed from ovary and fluid removed from tubes  2000  . ESOPHAGOGASTRODUODENOSCOPY  10/22/2012   Procedure: ESOPHAGOGASTRODUODENOSCOPY (EGD);  Surgeon: Malissa Hippo, MD;  Location: [facility];  Service: Endoscopy;  Laterality: N/A;  100  . FRACTURE SURGERY    . HARDWARE REMOVAL Left 02/21/2016   Procedure: HARDWARE REMOVAL LEFT SACROILIAC JOINT;  Surgeon: Myrene Galas, MD;  Location: [facility] OR;  Service: Orthopedics;  Laterality: Left;  . ORIF PELVIC FRACTURE N/A 07/30/2015   Procedure: OPEN REDUCTION INTERNAL FIXATION (ORIF) PELVIC FRACTURE;  Surgeon: Myrene Galas, MD;  Location: [facility] OR;  Service: Orthopedics;  Laterality: N/A;  . PARTIAL HYSTERECTOMY  2009  . SACRO-ILIAC PINNING Right 07/30/2015   Procedure: Loyal Gambler;  Surgeon: Myrene Galas, MD;  Location: [facility] OR;  Service: Orthopedics;  Laterality: Right;  . SACRO-ILIAC PINNING Left 09/04/2015   Procedure: LEFT TO RIGHT TRANS SACRO-ILIAC SCREW;  Surgeon: Myrene Galas, MD;  Location: [facility] OR;  Service: Orthopedics;  Laterality: Left;  .  SACROILIAC JOINT FUSION Left 02/21/2016   Procedure: LEFT SACROILIAC JOINT FUSION;  Surgeon: Myrene GalasMichael Handy, MD;  [facility];  Service: Orthopedics;  Laterality: Left;  . TUBAL LIGATION  1999    There were no vitals filed for this visit.  Subjective Assessment - 05/25/18 1442    Subjective  Pt stated she has apt with [facility] next Tuesday to look at back, stated the screw did not fuse in sacral area back in 2016. Current LBP pain scale 5/10, continues to have spasms.      Patient Stated Goals  decrease back pain    Currently in Pain?  Yes    Pain Score  5     Pain Location  Back    Pain Orientation  Right;Left;Lower    Pain Descriptors / Indicators  Aching;Dull;Sore    Pain Type  Chronic pain    Pain  Onset  More than a month ago    Pain Frequency  Constant    Aggravating Factors   standing, walking    Pain Relieving Factors  sitting, rest    Effect of Pain on Daily Activities  restricting everything                       [facility] Adult PT Treatment/Exercise - 05/25/18 0001      Lumbar Exercises: Stretches   Single Knee to Chest Stretch  Right;Left;2 reps;30 seconds      Lumbar Exercises: Standing   Forward Lunge  10 reps;Limitations    Forward Lunge Limitations  no UE's onto 4" step    Other Standing Lumbar Exercises  3D hip excursion 10x    Other Standing Lumbar Exercises  Palov press GTB 10 reps each direction with each LE lead (40 total)      Manual Therapy   Manual Therapy  Soft tissue mobilization    Manual therapy comments  manual complete separate than rest of tx    Soft tissue mobilization  Bil lumbar parasi               PT Short Term Goals - 05/12/18 [id number]      PT SHORT TERM GOAL #1   Title  Pt will have 1/2 grade improvement in MMT throughout proximal hips in order to decrease pain.    Baseline  7/31: see MMT    Time  3    Period  Weeks    Status  On-going      PT SHORT TERM GOAL #2   Title  Pt will have improved L SLS to 60 sec without pain to demo improved core strength.    Time  3    Period  Weeks    Status  Achieved      PT SHORT TERM GOAL #3   Title  Pt will have improved 30 sec chair rise test to 11x  without pain in order to demo improved balance and functional strength.    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    Baseline  7/31: 5280ft    Time  6  Period  Weeks    Status  On-going      PT LONG TERM GOAL #2   Title  Pt will have improved 30 sec chair rise test to  13x without pain to demo improved functional and core strength.    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    Baseline  7/31: reports her tolerance for standing and walking is about the same as when she started (10 mins standing, walking)    Time  6    Period  Weeks    Status  On-going      PT LONG TERM GOAL #4   Title  Pt will report being able to sleep through the night without awakening due to LBP in order to maximize her recovery.    Baseline  7/31: still wakes up with LBP about 2x/night, rolling over is what gets her, but she reports her sleeping has improved    Time  6    Period  Weeks    Status  On-going            Plan - 05/25/18 1724    Clinical Impression Statement  Session focus on core stability and stretches for pain control.  Pt able to complete majority of therex with good form with minimal cueing to improve form and activate appropriate muscalutre.  Pt continues to c/o intermittent spasms during activities like palvo.  EOS with manual soft tissue mobilization to address tightness for pain control.  No reports of pain at EOS.    Rehab Potential  Good    PT Frequency  3x / week    PT Duration  6 weeks    PT Treatment/Interventions  ADLs/Self Care Home Management;Aquatic Therapy;Cryotherapy;Electrical Stimulation;Moist Heat;Ultrasound;DME Instruction;Gait training;Stair training;Functional mobility training;Therapeutic activities;Therapeutic exercise;Balance training;Neuromuscular re-education;Patient/family education;Manual techniques;Scar mobilization;Passive range of motion;Dry needling;Energy conservation;Taping    PT Next Visit Plan  Continue core, functional, BLE strengthening, flexbility, and manual  for soft tissue restrictions. resume dry needling for bil lumbar paraspinals/multifidus when able    PT Home Exercise Plan  eval: DKTC; 04/30/18: Decompression exercises level 1 x 10 3'' holds 1x/day; 7/26: decomp wtih theraband       Patient will benefit from skilled therapeutic intervention in order to improve the following deficits and impairments:  Abnormal gait, Decreased activity tolerance, Decreased balance, Decreased endurance, Decreased mobility, Decreased range of motion, Decreased strength, Difficulty walking, Hypomobility, Increased fascial restricitons, Increased muscle spasms, Impaired flexibility, Improper body mechanics, Pain  Visit Diagnosis: Acute bilateral low back pain without sciatica  Muscle weakness (generalized)  Other symptoms and signs involving the musculoskeletal system     Problem List Patient Active Problem List   Diagnosis Date Noted  . Moderate persistent asthma, uncomplicated 09/24/2017  . Seasonal and perennial allergic rhinitis 09/24/2017  . SI joint arthritis 02/21/2016  . SI (sacroiliac) joint dysfunction 09/04/2015  . Fall from horse 07/30/2015  . Lumbar transverse process fracture (HCC) 07/30/2015  . Pelvic ring fracture (HCC) 07/28/2015  . Primary generalized (osteo)arthritis 03/13/2015  . Asthma 11/17/2014  . Gastroesophageal reflux disease 11/17/2014  . Vitamin  D deficiency 11/04/2014  . Dyslipidemia 08/11/2012  . 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 Allergic rhinitis 02/19/2010   Becky Saxasey Khoen Genet, LPTA; CBIS [phone]  Juel BurrowCockerham, Ember Gottwald Jo 05/25/2018, 5:30 PM  [facility] [address] [place], [place], [address] Phone: [phone]   Fax:  [phone]  Name: Jodi [name] MRN: [id number] Date of Birth: 12/18/1975

## 2018-05-26 ENCOUNTER — Telehealth: Payer: Self-pay | Admitting: Orthopaedic Surgery

## 2018-05-26 MED ORDER — HYDROCODONE-ACETAMINOPHEN 5-325 MG PO TABS: ORAL_TABLET | ORAL | 0 refills | Status: DC

## 2018-05-26 NOTE — Telephone Encounter (Signed)
Hydrocodone-Acetaminophen 5/325 mg  Qty 28 Tablets   PATIENT USES [place] [facility] 

## 2018-05-27 ENCOUNTER — Telehealth (HOSPITAL_COMMUNITY): Payer: Self-pay | Admitting: Pulmonary Disease

## 2018-05-27 ENCOUNTER — Ambulatory Visit (HOSPITAL_COMMUNITY): Payer: 59

## 2018-05-27 NOTE — Telephone Encounter (Signed)
05/27/18  pt called and said she needed to reschedule the appt... I told her I could add to the end of her schedule and she said ok

## 2018-05-31 ENCOUNTER — Telehealth (HOSPITAL_COMMUNITY): Payer: Self-pay | Admitting: Pulmonary Disease

## 2018-05-31 ENCOUNTER — Ambulatory Visit (HOSPITAL_COMMUNITY): Payer: 59

## 2018-05-31 NOTE — Telephone Encounter (Signed)
05/31/18  pt left a message that she wanted to reschedule this appt to the end of her sessions

## 2018-06-01 DIAGNOSIS — G8929 Other chronic pain: Secondary | ICD-10-CM | POA: Diagnosis not present

## 2018-06-01 DIAGNOSIS — M545 Low back pain: Secondary | ICD-10-CM | POA: Diagnosis not present

## 2018-06-01 NOTE — Addendum Note (Signed)
Addended by: Jerl SantosPOWELL, BROOKE E on: 06/01/2018 09:27 AM   Modules accepted: Orders

## 2018-06-02 ENCOUNTER — Telehealth (HOSPITAL_COMMUNITY): Payer: Self-pay

## 2018-06-02 ENCOUNTER — Ambulatory Visit (HOSPITAL_COMMUNITY): Payer: 59

## 2018-06-02 NOTE — Telephone Encounter (Signed)
No show, called and left message concerning missed apt today.  Included next apt date and time with contact information given.    Camelia Stelzner, LPTA; CBIS [phone]  

## 2018-06-07 ENCOUNTER — Ambulatory Visit (HOSPITAL_COMMUNITY): Payer: 59

## 2018-06-07 ENCOUNTER — Telehealth (HOSPITAL_COMMUNITY): Payer: Self-pay

## 2018-06-07 NOTE — Telephone Encounter (Signed)
Patient left a mess to cancel her appt for today.

## 2018-06-09 ENCOUNTER — Ambulatory Visit (HOSPITAL_COMMUNITY): Payer: 59

## 2018-06-09 ENCOUNTER — Encounter (HOSPITAL_COMMUNITY): Payer: Self-pay

## 2018-06-09 DIAGNOSIS — M545 Low back pain, unspecified: Secondary | ICD-10-CM

## 2018-06-09 DIAGNOSIS — M6281 Muscle weakness (generalized): Secondary | ICD-10-CM

## 2018-06-09 DIAGNOSIS — R29898 Other symptoms and signs involving the musculoskeletal system: Secondary | ICD-10-CM

## 2018-06-09 NOTE — Therapy (Signed)
[place], [place], [address] Phone: [phone]   Fax:  [phone]   Progress Note Reporting Period 05/12/18 to 06/09/18  See note below for Objective Data and Assessment of Progress/Goals.   Physical Therapy Treatment  Patient Details  Name: Jodi [name] MRN: [id number] Date of Birth: Feb 19, 1976 Referring Provider: Sanjuana Kava, MD   Encounter Date: 06/09/2018  PT End of Session - 06/09/18 0819    Visit Number  14    Number of Visits  19    Date for PT Re-Evaluation  06/01/18   minireassess 05/12/18   Authorization Type  [employer]    Authorization Time Period  04/20/18 to 06/01/18    Authorization - Visit Number  14    Authorization - Number of Visits  60    PT Start Time  0818    PT Stop Time  0846    PT Time Calculation (min)  28 min    Activity Tolerance  Patient limited by pain;No increased pain;Patient tolerated treatment well    Behavior During Therapy  [facility] for tasks assessed/performed       Past Medical History:  Diagnosis Date  . Abdominal pain, chronic, right upper quadrant   . Acute recurrent maxillary sinusitis 11/04/2014  . Allergic rhinitis   . Anemia   . Angio-edema   . Arthritis    knee  . Asthma    Cough variant  . Asthma   . Asthma   . Back pain   . Chronic pain syndrome 06/08/2013  . DVT (deep venous thrombosis) ([place]) 10 yrs ago  . Dyslipidemia 08/11/2012  . Eczema   . Fractures   . GERD (gastroesophageal reflux disease)   . Heart murmur    as a child  . Hyperlipidemia   . IBS (irritable bowel syndrome)   . Lung granuloma ([place]) 07/22/2011   33m per CT chest  . Pneumonia   . Shortness of breath    SOB even without exertion at times  . Sickle-cell trait ([facility]     Past Surgical History:  Procedure Laterality Date  . ABDOMINAL HYSTERECTOMY  4-5 yrs ago  . ABDOMINAL HYSTERECTOMY    . blood clot removed from lower abdomen  2000  . CHOLECYSTECTOMY  11/12/2012   Procedure: LAPAROSCOPIC CHOLECYSTECTOMY;  Surgeon: MJamesetta So MD;  Location: [facility] ORS;  Service: General;  Laterality: N/A;  . CHOLECYSTECTOMY    . COLONOSCOPY  11/04/2012   Procedure: COLONOSCOPY;  Surgeon: NRogene Houston MD;  Location: [facility];  Service: Endoscopy;  Laterality: N/A;  915  . cyst removed from ovary and fluid removed from tubes  2000  . ESOPHAGOGASTRODUODENOSCOPY  10/22/2012   Procedure: ESOPHAGOGASTRODUODENOSCOPY (EGD);  Surgeon: NRogene Houston MD;  [facility]: [facility];  Service: Endoscopy;  Laterality: N/A;  100  . FRACTURE SURGERY    . HARDWARE REMOVAL Left 02/21/2016   Procedure: HARDWARE REMOVAL LEFT SACROILIAC JOINT;  Surgeon: MAltamese Clearwater MD;  [place]  Service: Orthopedics;  Laterality: Left;  . ORIF PELVIC FRACTURE N/A 07/30/2015   Procedure: OPEN REDUCTION INTERNAL FIXATION (ORIF) PELVIC FRACTURE;  Surgeon: MAltamese Baylor MD;  [place]  Service: Orthopedics;  Laterality: N/A;  . PARTIAL HYSTERECTOMY  2009  . SACRO-ILIAC PINNING Right 07/30/2015   Procedure: SDub Mikes  Surgeon: MAltamese [place]ford MD;  [place]  Service: Orthopedics;  Laterality: Right;  . SACRO-ILIAC PINNING Left 09/04/2015   Procedure: LEFT TO  RIGHT TRANS SACRO-ILIAC SCREW;  Surgeon: Altamese Tehama, MD;  Location: [place];  Service: Orthopedics;  Laterality: Left;  . SACROILIAC JOINT FUSION Left 02/21/2016   Procedure: LEFT SACROILIAC JOINT FUSION;  Surgeon: Altamese , MD;  Location: [place];  Service: Orthopedics;  Laterality: Left;  . TUBAL LIGATION  1999    There were no vitals filed for this visit.  Subjective Assessment - 06/09/18 0819    Subjective  Pt reports that her back pain/spasms have increased. Her pains shoot up her spine. She said that Dr. Tristan Schroeder doesn't think her pain is the hardware. He told her to see a spine specialist.     Patient Stated Goals  decrease back pain    Currently in Pain?  Yes    Pain Score  4     Pain Location  Back     Pain Orientation  Right;Left;Lower    Pain Descriptors / Indicators  Aching;Dull;Sore    Pain Type  Chronic pain    Pain Onset  More than a month ago    Pain Frequency  Constant    Aggravating Factors   standing, walking    Pain Relieving Factors  sitting, rest    Effect of Pain on Daily Activities  restricting everything         [facility] PT Assessment - 06/09/18 0001      Assessment   Medical Diagnosis  chronic midline low back pain without sciatica, pelvic pain    Referring Provider  Sanjuana Kava, MD    Onset Date/Surgical Date  03/23/18   surgery in 2016 and 2017; Dr. Marcelino Scot performed all 3 surgerie   Next MD Visit  nothing yet; sees spine specialist on 07/08/18    Prior Therapy  yes for back in 2016-2017      Observation/Other Assessments   Focus on Therapeutic Outcomes (FOTO)   57% limited   was 59% limited     Sit to Stand   Comments  30 sec chair rise test: 14x   was 11x     Strength   Right Hip Extension  4+/5   was 4+   Right Hip ABduction  4+/5   was4+   Left Hip Extension  4/5   was 4   Left Hip ABduction  4+/5   was 4+   Left Knee Flexion  5/5   was 4+     Ambulation/Gait   Ambulation Distance (Feet)  594 Feet   3MWT; was 520f   Assistive device  None    Gait Pattern  Step-through pattern;Decreased dorsiflexion - right;Decreased dorsiflexion - left;Antalgic;Trendelenburg;Wide base of support   increased lumbar rotation and decreased pelvic rotation bil           PT Education - 06/09/18 0819    Education Details  reassessment findings    Person(s) Educated  Patient    Methods  Explanation    Comprehension  Verbalized understanding       PT Short Term Goals - 06/09/18 03903     PT SHORT TERM GOAL #1   Title  Pt will have 1/2 grade improvement in MMT throughout proximal hips in order to decrease pain.    Baseline  8/28: see MMT    Time  3    Period  Weeks    Status  Partially Met      PT SHORT TERM GOAL #2   Title  Pt will have  improved L SLS to 60 sec without pain to demo  improved core strength.    Time  3    Period  Weeks    Status  Achieved      PT SHORT TERM GOAL #3   Title  Pt will have improved 30 sec chair rise test to 11x  without pain in order to demo improved balance and functional strength.    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    Baseline  8/28: 5926f   Time  6    Period  Weeks    Status  On-going      PT LONG TERM GOAL #2   Title  Pt will have improved 30 sec chair rise test to 13x without pain to demo improved functional and core strength.    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    Baseline  8/28: pt reports that she has improved very little in her standing, walking, and lifting; some days are better than others but overall no real change    Time  6    Period  Weeks    Status  On-going      PT LONG TERM GOAL #4   Title  Pt will report being able to sleep through the night without awakening due to LBP in order to maximize her recovery.    Baseline  8/28: still wakes up with LBP about 2x/night, rolling over is what gets her, and reports no change in her sleeping since starting therapy    Time  6    Period  Weeks    Status  On-going            Plan - 06/09/18 [id number]  Clinical Impression Statement  PT reassessed pt's goals and outcome measures this date. Pt has made minimal subjective and objective improvements overall as illustrated above. She is still c/o increased  muscle spasm pain and difficulty with functional mobility due to pain and her sleep is still disturbed due to pain. Therapy sessions have attempted core, BLE, functional strengthening as well as manual STM, stretching, and dry needling to decrease her pain, all with minimal effect. Pt sees a spine specialist on 07/08/18. PT recommending placing pt on 4-week HEP/hold until 07/09/18 in case the spine specialist discovers something else/new going on with her and refers her back to therapy. Otherwise, pt will be discharged at that time due to lack of progress.    Rehab Potential  Good    PT Frequency  3x / week    PT Duration  6 weeks    PT Treatment/Interventions  ADLs/Self Care Home Management;Aquatic Therapy;Cryotherapy;Electrical Stimulation;Moist Heat;Ultrasound;DME Instruction;Gait training;Stair training;Functional mobility training;Therapeutic activities;Therapeutic exercise;Balance training;Neuromuscular re-education;Patient/family education;Manual techniques;Scar mobilization;Passive range of motion;Dry needling;Energy conservation;Taping  PT Next Visit Plan  reassess and/or discharge; Continue core, functional, BLE strengthening, flexbility, and manual for soft tissue restrictions. resume dry needling for bil lumbar paraspinals/multifidus when able    PT Home Exercise Plan  eval: DKTC; 04/30/18: Decompression exercises level 1 x 10 3'' holds 1x/day; 7/26: decomp wtih theraband    Consulted and Agree with Plan of Care  Patient       Patient will benefit from skilled therapeutic intervention in order to improve the following deficits and impairments:  Abnormal gait, Decreased activity tolerance, Decreased balance, Decreased endurance, Decreased mobility, Decreased range of motion, Decreased strength, Difficulty walking, Hypomobility, Increased fascial restricitons, Increased muscle spasms, Impaired flexibility, Improper body mechanics, Pain  Visit Diagnosis: Acute bilateral low back pain without  sciatica - Plan: PT plan of care cert/re-cert  Muscle weakness (generalized) - Plan: PT plan of care cert/re-cert  Other symptoms and signs involving the musculoskeletal system - Plan: PT plan of care cert/re-cert     Problem List Patient Active Problem List   Diagnosis Date Noted  . Moderate persistent asthma, uncomplicated 62/83/6629  . Seasonal and perennial allergic rhinitis 09/24/2017  . SI joint arthritis 02/21/2016  . SI (sacroiliac) joint dysfunction 09/04/2015  . Fall from horse 07/30/2015  . Lumbar transverse process fracture ([place]) 07/30/2015  . Pelvic ring fracture ([place]) 07/28/2015  . Primary generalized (osteo)arthritis 03/13/2015  . Asthma 11/17/2014  . Gastroesophageal reflux disease 11/17/2014  . Vitamin D deficiency 11/04/2014  . Dyslipidemia 08/11/2012  . Lung granuloma ([place]) 07/22/2011  . Allergic rhinitis 02/19/2010        Geraldine Solar PT, DPT  [place] [address] [place], [place], [address] Phone: [phone]   Fax:  [phone]  Name: Jodi [name] MRN: [id number] Date of Birth: [birth year]

## 2018-06-15 ENCOUNTER — Ambulatory Visit (INDEPENDENT_AMBULATORY_CARE_PROVIDER_SITE_OTHER): Payer: 59

## 2018-06-15 DIAGNOSIS — J309 Allergic rhinitis, unspecified: Secondary | ICD-10-CM

## 2018-06-30 DIAGNOSIS — Z09 Encounter for follow-up examination after completed treatment for conditions other than malignant neoplasm: Secondary | ICD-10-CM | POA: Diagnosis not present

## 2018-07-02 DIAGNOSIS — I10 Essential (primary) hypertension: Secondary | ICD-10-CM | POA: Diagnosis not present

## 2018-07-02 DIAGNOSIS — J301 Allergic rhinitis due to pollen: Secondary | ICD-10-CM | POA: Diagnosis not present

## 2018-07-02 DIAGNOSIS — J45901 Unspecified asthma with (acute) exacerbation: Secondary | ICD-10-CM | POA: Diagnosis not present

## 2018-07-05 ENCOUNTER — Emergency Department (HOSPITAL_COMMUNITY)
Admission: EM | Admit: 2018-07-05 | Discharge: 2018-07-05 | Disposition: A | Discharge status: Home / Self Care | Payer: 59 | Attending: Emergency Medicine | Admitting: Emergency Medicine

## 2018-07-05 ENCOUNTER — Other Ambulatory Visit: Payer: Self-pay

## 2018-07-05 ENCOUNTER — Encounter (HOSPITAL_COMMUNITY): Payer: Self-pay | Admitting: Emergency Medicine

## 2018-07-05 DIAGNOSIS — Z79899 Other long term (current) drug therapy: Secondary | ICD-10-CM | POA: Insufficient documentation

## 2018-07-05 DIAGNOSIS — Z87891 Personal history of nicotine dependence: Secondary | ICD-10-CM | POA: Diagnosis not present

## 2018-07-05 DIAGNOSIS — J4540 Moderate persistent asthma, uncomplicated: Secondary | ICD-10-CM | POA: Diagnosis not present

## 2018-07-05 DIAGNOSIS — G8929 Other chronic pain: Secondary | ICD-10-CM | POA: Diagnosis not present

## 2018-07-05 DIAGNOSIS — M545 Low back pain: Secondary | ICD-10-CM | POA: Insufficient documentation

## 2018-07-05 MED ORDER — METHOCARBAMOL 500 MG PO TABS
750.0000 mg | ORAL_TABLET | Freq: Once | ORAL | Status: AC
  Administered 2018-07-05: 750 mg via ORAL
  Filled 2018-07-05: qty 2

## 2018-07-05 MED ORDER — KETOROLAC TROMETHAMINE 30 MG/ML IJ SOLN
30.0000 mg | Freq: Once | INTRAMUSCULAR | Status: AC
  Administered 2018-07-05: 30 mg via INTRAMUSCULAR
  Filled 2018-07-05: qty 1

## 2018-07-05 MED ORDER — METHOCARBAMOL 500 MG PO TABS: 500.0000 mg | ORAL_TABLET | Freq: Two times a day (BID) | ORAL | 0 refills | Status: DC

## 2018-07-05 NOTE — Discharge Instructions (Addendum)
You were evaluated today for low back pain.  Your pain was controlled in the ED with a shot of Toradol and Robaxin a muscle relaxer.  Please keep your follow-up appointment with your spine specialist in 2 days.    Blood pressure was elevated while you are in the department today.  Please follow-up with your primary care provider for reevaluation of this.  Please return to the emergency room with any new or worsening symptoms such as:  Get help right away if: You cannot control when you poop (bowel movement) or pee (urinate). Your arms or legs feel weak. Your arms or legs lose feeling (numbness). You feel sick to your stomach (nauseous) or throw up (vomit). You have belly (abdominal) pain. You feel like you may pass out (faint).

## 2018-07-05 NOTE — ED Provider Notes (Signed)
[facility] EMERGENCY DEPARTMENT Provider Note   CSN: [id number] Arrival date & time: 07/05/18  1101   History   Chief Complaint Chief Complaint  Patient presents with  . Back Pain    HPI Jodi [name] is a 42 y.o. female medical history significant for chronic back pain and sickle cell trait who presents for evaluation of lumbar back pain.  Per patient she fell off of a horse approximately 1 year ago and has had chronic back pain since.  Has been seen by surgeons at [facility] for this.  Per patient she has a follow-up appointment with a spine specialist at [facility] in 2 days.  Patient states she was standing on concrete without proper shoe attire for multiple hours last week.  Since this she has noticed increase back pain.  Describes her pain as spasms that radiate across her whole back.  Rates her pain a 10/10.  No recent falls or injuries. Denies fever, chills, chest pain, shortness of breath, radiation of her back pain, numbness, tingling, weakness, IV drug use, history of malignancy, bowel or bladder incontinence, saddle anesthesia.  Per patient she was given Flexeril and Voltaren at her last appointment with her surgeon, however has not had follow-up and does not have refills on these prescriptions. HPI  Past Medical History:  Diagnosis Date  . Abdominal pain, chronic, right upper quadrant   . Acute recurrent maxillary sinusitis 11/04/2014  . Allergic rhinitis   . Anemia   . Angio-edema   . Arthritis    knee  . Asthma    Cough variant  . Asthma   . Asthma   . Back pain   . Chronic pain syndrome 06/08/2013  . DVT (deep venous thrombosis) (HCC) 10 yrs ago  . Dyslipidemia 08/11/2012  . Eczema   . Fractures   . GERD (gastroesophageal reflux disease)   . Heart murmur    as a child  . Hyperlipidemia   . IBS (irritable bowel syndrome)   . 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 Pneumonia   . Shortness of breath    SOB even without exertion at times  . Sickle-cell  trait [facility])     Patient Active Problem List   Diagnosis Date Noted  . Moderate persistent asthma, uncomplicated 09/24/2017  . Seasonal and perennial allergic rhinitis 09/24/2017  . SI joint arthritis 02/21/2016  . SI (sacroiliac) joint dysfunction 09/04/2015  . Fall from horse 07/30/2015  . Lumbar transverse process fracture (HCC) 07/30/2015  . Pelvic ring fracture (HCC) 07/28/2015  . Primary generalized (osteo)arthritis 03/13/2015  . Asthma 11/17/2014  . Gastroesophageal reflux disease 11/17/2014  . Vitamin D deficiency 11/04/2014  . Dyslipidemia 08/11/2012  . 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 Allergic rhinitis 02/19/2010    Past Surgical History:  Procedure Laterality Date  . ABDOMINAL HYSTERECTOMY  4-5 yrs ago  . ABDOMINAL HYSTERECTOMY    . blood clot removed from lower abdomen  2000  . CHOLECYSTECTOMY  11/12/2012   Procedure: LAPAROSCOPIC CHOLECYSTECTOMY;  Surgeon: Dalia Heading, MD;  Location: [facility] ORS;  Service: General;  Laterality: N/A;  . CHOLECYSTECTOMY    . COLONOSCOPY  11/04/2012   Procedure: COLONOSCOPY;  Surgeon: Malissa Hippo, MD;  Location: [facility] ENDO SUITE;  Service: Endoscopy;  Laterality: N/A;  915  . cyst removed from ovary and fluid removed from tubes  2000  . ESOPHAGOGASTRODUODENOSCOPY  10/22/2012   Procedure: ESOPHAGOGASTRODUODENOSCOPY (EGD);  Surgeon: Malissa Hippo, MD;  Location: [facility]  ENDO SUITE;  Service: Endoscopy;  Laterality: N/A;  100  . FRACTURE SURGERY    . HARDWARE REMOVAL Left 02/21/2016   Procedure: HARDWARE REMOVAL LEFT SACROILIAC JOINT;  Surgeon: Myrene Galas, MD;  Location: [facility] OR;  Service: Orthopedics;  Laterality: Left;  . ORIF PELVIC FRACTURE N/A 07/30/2015   Procedure: OPEN REDUCTION INTERNAL FIXATION (ORIF) PELVIC FRACTURE;  Surgeon: Myrene Galas, MD;  Location: [facility] OR;  Service: Orthopedics;  Laterality: N/A;  . PARTIAL HYSTERECTOMY  2009  . SACRO-ILIAC PINNING Right 07/30/2015   Procedure: Loyal Gambler;  Surgeon: Myrene Galas,  MD;  Location: [facility] OR;  Service: Orthopedics;  Laterality: Right;  . SACRO-ILIAC PINNING Left 09/04/2015   Procedure: LEFT TO RIGHT TRANS SACRO-ILIAC SCREW;  Surgeon: Myrene Galas, MD;  Location: [facility] OR;  Service: Orthopedics;  Laterality: Left;  . SACROILIAC JOINT FUSION Left 02/21/2016   Procedure: LEFT SACROILIAC JOINT FUSION;  Surgeon: Myrene Galas, MD;  Location: [facility] OR;  Service: Orthopedics;  Laterality: Left;  . TUBAL LIGATION  1999     OB History    Gravida  0   Para  0   Term  0   Preterm  0   AB  0   Living        SAB  0   TAB  0   Ectopic  0   Multiple      Live Births               Home Medications    Prior to Admission medications   Medication Sig Start Date End Date Taking? Authorizing Provider  albuterol (PROVENTIL HFA;VENTOLIN HFA) 108 (90 BASE) MCG/ACT inhaler Inhale 2 puffs into the lungs every 6 (six) hours as needed for wheezing or shortness of breath. 05/08/15   Kerri Perches, MD  amLODipine (NORVASC) 5 MG tablet Take 5 mg by mouth daily. 03/29/18   [provider]  Azelastine-Fluticasone 137-50 MCG/ACT SUSP Place 2 sprays into both nostrils See admin instructions. 1-2 times daily. 09/24/17   Alfonse Spruce, MD  budesonide-formoterol [facility]) 160-4.5 MCG/ACT inhaler Inhale 2 puffs into the lungs 2 (two) times daily. 03/11/18   Alfonse Spruce, MD  celecoxib (CELEBREX) 100 MG capsule Take 100 mg by mouth daily.    [provider]  cyclobenzaprine (FLEXERIL) 10 MG tablet Take 1 tablet (10 mg total) by mouth 3 (three) times daily. 03/27/18   Ivery Quale, PA-C  desipramine (NORPRAMIN) 50 MG tablet Take 50 mg by mouth at bedtime.    [provider]  fluticasone (FLONASE) 50 MCG/ACT nasal spray Place 2 sprays into both nostrils daily. 03/11/18 05/04/18  Alfonse Spruce, MD  HYDROcodone-acetaminophen (NORCO/VICODIN) 5-325 MG tablet One tablet by mouth every six hours as needed for pain.  Seven day limit  05/26/18   Darreld Mclean, MD  ipratropium-albuterol (DUONEB) 0.5-2.5 (3) MG/3ML SOLN Take 3 mLs by nebulization every 6 (six) hours as needed (shortness of breath).     [provider]  levocetirizine (XYZAL) 5 MG tablet TAKE ONE TABLET BY MOUTH ONCE DAILY IN THE EVENING 04/28/16   Kerri Perches, MD  meloxicam (MOBIC) 15 MG tablet Take 1 tablet (15 mg total) by mouth daily. 03/27/18   Ivery Quale, PA-C  methocarbamol (ROBAXIN) 500 MG tablet Take 1 tablet (500 mg total) by mouth 2 (two) times daily. 07/05/18   Jenisis Harmsen A, PA-C  mometasone-formoterol (DULERA) 200-5 MCG/ACT AERO Inhale 2 puffs into the lungs 2 (two) times daily. With spacer 09/24/17  Alfonse Spruce, MD  montelukast (SINGULAIR) 10 MG tablet TAKE ONE TABLET BY MOUTH IN THE MORNING 04/28/16   Kerri Perches, MD  naproxen sodium (ANAPROX) 220 MG tablet Take 440 mg by mouth 2 (two) times daily with a meal.    [provider]  omeprazole (PRILOSEC) 20 MG capsule Take 1 capsule (20 mg total) by mouth daily. 01/05/14   Setzer, Brand Males, NP  predniSONE (DELTASONE) 10 MG tablet Take 10 mg by mouth daily with breakfast.    [provider]  Tiotropium Bromide Monohydrate (SPIRIVA RESPIMAT) 1.25 MCG/ACT AERS Inhale 2 puffs into the lungs daily. 12/10/17   Alfonse Spruce, MD    Family History Family History  Problem Relation Age of Onset  . Asthma Mother   . Hypertension Mother   . Bronchiolitis Mother   . Hypertension Father   . Hyperlipidemia Father        recurrent rectum polyps   . Allergic rhinitis Father   . Diabetes Sister   . Lupus Sister   . Depression Sister     Social History Social History   Tobacco Use  . Smoking status: Former Smoker    Packs/day: 0.25    Years: 10.00    Pack years: 2.50    Types: Cigarettes    Last attempt to quit: 10/13/2008    Years since quitting: 9.7  . Smokeless tobacco: Never Used  Substance Use Topics  . Alcohol use: No  . Drug use:  No     Allergies   Prochlorperazine   Review of Systems Review of Systems  Constitutional: Negative for activity change, appetite change, chills, diaphoresis, fatigue, fever and unexpected weight change.  Respiratory: Negative.   Cardiovascular: Negative.   Gastrointestinal: Negative.   Genitourinary: Negative.   Musculoskeletal: Positive for back pain. Negative for gait problem, joint swelling, myalgias, neck pain and neck stiffness.  Skin: Negative.      Physical Exam Updated Vital Signs BP (!) 149/93 (BP Location: Left Arm)   Pulse 99   Temp 98.6 F (37 C) (Oral)   Resp 18   Ht 5\' 6"  (1.676 m)   Wt 99.8 kg   LMP 05/17/2012   SpO2 100%   BMI 35.51 kg/m   Physical Exam  Physical Exam  Constitutional: Pt appears well-developed and well-nourished. No distress.  HENT:  Head: Normocephalic and atraumatic.  Mouth/Throat: Oropharynx is clear and moist. No oropharyngeal exudate.  Eyes: Conjunctivae are normal.  Neck: Normal range of motion. Neck supple.  Full ROM without pain  Cardiovascular: Normal rate, regular rhythm and intact distal pulses.   Pulmonary/Chest: Effort normal and breath sounds normal. No respiratory distress. Pt has no wheezes.  Abdominal: Soft. Pt exhibits no distension. There is no tenderness, rebound or guarding. No abd bruit or pulsatile mass Musculoskeletal:  Full range of motion of the T-spine and L-spine with flexion, hyperextension, and lateral flexion. No midline tenderness or stepoffs. No tenderness to palpation of the spinous processes of the T-spine or L-spine. Mild tenderness to palpation of the paraspinous muscles of the L-spine. Negative straight leg raise. Lymphadenopathy:    Pt has no cervical adenopathy.  Neurological: Pt is alert. Pt has normal reflexes.  Reflex Scores:      Bicep reflexes are 2+ on the right side and 2+ on the left side.      Brachioradialis reflexes are 2+ on the right side and 2+ on the left side.       Patellar reflexes are  2+ on the right side and 2+ on the left side.      Achilles reflexes are 2+ on the right side and 2+ on the left side. Speech is clear and goal oriented, follows commands Normal 5/5 strength in upper and lower extremities bilaterally including dorsiflexion and plantar flexion, strong and equal grip strength Sensation normal to light and sharp touch Moves extremities without ataxia, coordination intact Normal gait Normal balance No Clonus Skin: Skin is warm and dry. No rash noted or lesions noted. Pt is not diaphoretic. No erythema, ecchymosis,edema or warmth.  Psychiatric: Pt has a normal mood and affect. Behavior is normal.  Nursing note and vitals reviewed. ED Treatments / Results  Labs (all labs ordered are listed, but only abnormal results are displayed) Labs Reviewed - No data to display  EKG None  Radiology No results found.  Procedures Procedures (including critical care time)  Medications Ordered in ED Medications  ketorolac (TORADOL) 30 MG/ML injection 30 mg (30 mg Intramuscular Given 07/05/18 1222)  methocarbamol (ROBAXIN) tablet 750 mg (750 mg Oral Given 07/05/18 1221)     Initial Impression / Assessment and Plan / ED Course  I have reviewed the triage vital signs and the nursing notes as well as past medical history.  Pertinent labs & imaging results that were available during my care of the patient were reviewed by me and considered in my medical decision making (see chart for details).  42 year old otherwise healthy female presents with acute on chronic back pain.  Has follow-up appointment with spine surgeon in 2 days, however is out of her Flexeril and Voltaren. No neurological deficits and normal neuro exam.  Patient can walk but states is painful.  No loss of bowel or bladder control. No concern for cauda equina.  No fever, night sweats, weight loss, h/o cancer, IVDU.  Will try IM Toradol and Robaxin for pain control and reevaluate.  On  reevaluation patient states her pain has improved.  Discussed at this time not needing further imaging.  Discussed that she needs to keep her follow-up appointment with her spine specialist in 2 days.  Will DC home with short course of Robaxin as is work for her spasms while in the department.  Patient's blood pressure was mildly elevated in the department today.  No headache, blurred vision, chest pain, shortness of breath.  Per patient she does have history of hypertension.  Discussed follow-up with her primary care provider for reevaluation.  Discussed strict return precautions and reasons for her to return to the emergency room.  Patient voiced understanding.     Final Clinical Impressions(s) / ED Diagnoses   Final diagnoses:  Chronic bilateral low back pain without sciatica    ED Discharge Orders         Ordered    methocarbamol (ROBAXIN) 500 MG tablet  2 times daily     07/05/18 1317           Dezaria Methot A, PA-C 07/05/18 1322    Blane OharaZavitz, Joshua, MD 07/09/18 1732

## 2018-07-05 NOTE — ED Triage Notes (Signed)
Pt reports chronic back pain from a horse accident years ago.  Has been worse for the past few days with no new injury.

## 2018-07-05 NOTE — ED Notes (Signed)
Patient given discharge instruction, verbalized understand. Patient ambulatory out of the department.  

## 2018-07-06 ENCOUNTER — Ambulatory Visit (INDEPENDENT_AMBULATORY_CARE_PROVIDER_SITE_OTHER): Payer: 59

## 2018-07-06 DIAGNOSIS — J309 Allergic rhinitis, unspecified: Secondary | ICD-10-CM

## 2018-07-08 ENCOUNTER — Telehealth: Payer: Self-pay | Admitting: Orthopaedic Surgery

## 2018-07-08 DIAGNOSIS — M545 Low back pain: Secondary | ICD-10-CM | POA: Diagnosis not present

## 2018-07-08 DIAGNOSIS — M533 Sacrococcygeal disorders, not elsewhere classified: Secondary | ICD-10-CM | POA: Diagnosis not present

## 2018-07-08 DIAGNOSIS — R102 Pelvic and perineal pain: Secondary | ICD-10-CM

## 2018-07-08 DIAGNOSIS — M62830 Muscle spasm of back: Secondary | ICD-10-CM | POA: Diagnosis not present

## 2018-07-08 NOTE — Telephone Encounter (Signed)
I called to make appointment but they want to schedule directly with the patient. I called her to advised, she will call to make appointment.

## 2018-07-08 NOTE — Telephone Encounter (Signed)
Jodi [name] called and said that she doesn't feel she is getting anywhere or getting any help from the physicians at [facility].  She wants to go to someone at [facility]  Can we refer her to there please?  Thanks

## 2018-07-08 NOTE — Telephone Encounter (Signed)
Put in referral will call for appointment [phone]

## 2018-07-08 NOTE — Telephone Encounter (Signed)
OK 

## 2018-07-08 NOTE — Telephone Encounter (Signed)
Patient has new referral to [facility] as noted, and appointment has been scheduled with Dr Thereasa Solo on 07/21/18. Asking would Dr Hilda Lias prescribe muscle relaxer Robaxin or another muscle relaxer; said last prescription was for 5 days. If so, pharmacy is [facility], [place].

## 2018-07-09 ENCOUNTER — Ambulatory Visit (HOSPITAL_COMMUNITY): Payer: 59

## 2018-07-09 ENCOUNTER — Encounter (HOSPITAL_COMMUNITY): Payer: Self-pay

## 2018-07-09 ENCOUNTER — Telehealth (HOSPITAL_COMMUNITY): Payer: Self-pay

## 2018-07-09 NOTE — Therapy (Signed)
[facility] [address] [place], [place], [address] Phone: [phone]   Fax:  [phone]  Patient Details  Name: Jodi [name] MRN: [id number] Date of Birth: 11/06/1975 Referring Provider:  No ref. provider found  Encounter Date: 07/09/2018   Patient called and wants to be discharged; her MD is sending her to [facility].  PHYSICAL THERAPY DISCHARGE SUMMARY  Visits from Start of Care: 14  Current functional level related to goals / functional outcomes: See last note   Remaining deficits: See last note   Education / Equipment: n/a  Plan: Patient agrees to discharge.  Patient goals were partially met. Patient is being discharged due to the patient's request.  ?????      Brooke Powell PT, DPT  [facility] [address] [place], [place], [address] Phone: [phone]   Fax:  [phone] 

## 2018-07-09 NOTE — Telephone Encounter (Signed)
Patient wants to be discharged her MD is sending her to [facility].

## 2018-07-13 MED ORDER — METHOCARBAMOL 500 MG PO TABS: 500.0000 mg | ORAL_TABLET | Freq: Two times a day (BID) | ORAL | 0 refills | Status: DC

## 2018-07-16 DIAGNOSIS — J301 Allergic rhinitis due to pollen: Secondary | ICD-10-CM | POA: Diagnosis not present

## 2018-07-16 DIAGNOSIS — E669 Obesity, unspecified: Secondary | ICD-10-CM | POA: Diagnosis not present

## 2018-07-16 DIAGNOSIS — J4551 Severe persistent asthma with (acute) exacerbation: Secondary | ICD-10-CM | POA: Diagnosis not present

## 2018-07-21 DIAGNOSIS — M545 Low back pain: Secondary | ICD-10-CM | POA: Diagnosis not present

## 2018-07-21 DIAGNOSIS — M47818 Spondylosis without myelopathy or radiculopathy, sacral and sacrococcygeal region: Secondary | ICD-10-CM | POA: Diagnosis not present

## 2018-08-18 DIAGNOSIS — M47818 Spondylosis without myelopathy or radiculopathy, sacral and sacrococcygeal region: Secondary | ICD-10-CM | POA: Diagnosis not present

## 2018-08-18 DIAGNOSIS — M4328 Fusion of spine, sacral and sacrococcygeal region: Secondary | ICD-10-CM | POA: Diagnosis not present

## 2018-08-27 ENCOUNTER — Ambulatory Visit (INDEPENDENT_AMBULATORY_CARE_PROVIDER_SITE_OTHER): Payer: 59 | Admitting: *Deleted

## 2018-08-27 DIAGNOSIS — J309 Allergic rhinitis, unspecified: Secondary | ICD-10-CM | POA: Diagnosis not present

## 2018-09-07 ENCOUNTER — Ambulatory Visit: Payer: 59 | Admitting: Allergy & Immunology

## 2018-09-15 ENCOUNTER — Encounter: Payer: Self-pay | Admitting: Allergy & Immunology

## 2018-09-15 ENCOUNTER — Ambulatory Visit: Payer: 59 | Admitting: Allergy & Immunology

## 2018-09-15 ENCOUNTER — Ambulatory Visit: Payer: Self-pay

## 2018-09-15 VITALS — BP 122/80 | HR 101 | Resp 18 | Ht 65.75 in | Wt 224.2 lb

## 2018-09-15 DIAGNOSIS — J302 Other seasonal allergic rhinitis: Secondary | ICD-10-CM | POA: Diagnosis not present

## 2018-09-15 DIAGNOSIS — J4540 Moderate persistent asthma, uncomplicated: Secondary | ICD-10-CM | POA: Diagnosis not present

## 2018-09-15 DIAGNOSIS — J3089 Other allergic rhinitis: Secondary | ICD-10-CM | POA: Diagnosis not present

## 2018-09-15 DIAGNOSIS — K219 Gastro-esophageal reflux disease without esophagitis: Secondary | ICD-10-CM

## 2018-09-15 DIAGNOSIS — J309 Allergic rhinitis, unspecified: Secondary | ICD-10-CM

## 2018-09-15 DIAGNOSIS — M47818 Spondylosis without myelopathy or radiculopathy, sacral and sacrococcygeal region: Secondary | ICD-10-CM | POA: Diagnosis not present

## 2018-09-15 MED ORDER — FLUTICASONE-SALMETEROL 230-21 MCG/ACT IN AERO: 2.0000 | INHALATION_SPRAY | Freq: Two times a day (BID) | RESPIRATORY_TRACT | 5 refills | Status: DC

## 2018-09-15 NOTE — Patient Instructions (Addendum)
1. Moderate persistent asthma, uncomplicated - Lung testing actually looked quite good today.  - With all of the prednisone, you really should be on a daily controller medication. - Since Advair worked so well for so long, we will [facility] to you the [facility]. - Information on Dupixent provided and consent form filled out. - Tammy will be reaching out to you to discuss this.  - Hopefully we can get it approved without needing a complete blood count (we cannot get one today since you are on prednisone currently) - Daily controller medication(s): Singulair 10mg  daily and Advair 230/1721mcg two puffs twice daily with spacer - Prior to physical activity: ProAir 2 puffs 10-15 minutes before physical activity. - Rescue medications: ProAir 4 puffs every 4-6 hours as needed - Asthma control goals:  * Full participation in all desired activities (may need albuterol before activity) * Albuterol use two time or less a week on average (not counting use with activity) * Cough interfering with sleep two time or less a month * Oral steroids no more than once a year * No hospitalizations  2. Chronic rhinitis (weeds, grasses, indoor molds, outdoor molds, dust mites, cat and dog) - Continue with: Xyzal (levocetirizine) 5mg  tablet once daily, Singulair (montelukast) 10mg  daily and Flonase (fluticasone) two sprays per nostril daily  - You can use an extra dose of the antihistamine, if needed, for breakthrough symptoms.  - Consider nasal saline rinses 1-2 times daily to remove allergens from the nasal cavities as well as help with mucous clearance (this is especially helpful to do before the nasal sprays are given) - Continue with allergy shots at the same schedule.  . 3. Reflux - Continue with Prilosec daily.  4. Return in about 1 month (around 10/16/2018).   Please inform us of any Emergency Department visits, hospitalizations, or changes in symptoms. Call us before going to the ED for breathing or allergy  symptoms since we might be able to fit you in for a sick visit. Feel free to contact us anytime with any questions, problems, or concerns.  It was a pleasure to see you again today!  Websites that have reliable patient information: 1. American Academy of Asthma, Allergy, and Immunology: www.aaaai.org 2. Food Allergy Research and Education (FARE): foodallergy.org 3. Mothers of Asthmatics: http://www.asthmacommunitynetwork.org 4. American College of Allergy, Asthma, and Immunology: MissingWeapons.cawww.acaai.org   Make sure you are registered to vote!

## 2018-09-15 NOTE — Progress Notes (Signed)
FOLLOW UP  Date of Service/Encounter:  09/15/18   Assessment:   Moderate persistent asthma- likely with VCD component  Gastroesophageal reflux disease- on PPI  Seasonal and perennial allergic rhinitis(weeds, grasses, indoor molds, outdoor molds, dust mites, cat and dog)   Asthma Reportables:  Severity: moderate persistent  Risk: high due to multiple steroid courses (5-8 per year, per patient) Control: well controlled   Jodi [name] presents for a follow-up visit.  She has a history of moderate persistent asthma which is not well controlled.  It seems that we have done nothing to help her asthma control since I first saw her around 1 year ago.  According to my note 1 year ago, she was requiring about 4 rounds of steroids per 12 months.  This does not seem to have changed at all, as she estimates that she has had 3 rounds of since her last visit.  However, this is likely due to the fact that she is no longer on a controller medication.  Her Symbicort in addition to not working well, was also quite expensive despite the co-pay card.  I did offer to talk to our representative about the co-pay card to make sure that it is working properly, but she said she did not want to take it anymore anyway.  She also reports that Jodi [name] was not working when she was on this.  She has been on Breo in the past, but she thinks that Advair worked the best.  However, the dry powder started to give her a hoarse voice is why she switched from Advair.  We are going to change her to Advair HFA today to see if this provides the same level of control as the Advair Diskus.  I also think she needs to strongly consider the addition of a biologic as a steroid sparing agent.  Review of her past complete blood counts show that she has not had an eosinophil count higher than 100, but I am hopeful that we can get fixed and approved on a steroid sparing indication.  Dupixent would be an excellent addition because she will  be homebound after her surgery and it would be easier for her to have a medication that can be administered at home.   Her allergic rhinitis seems well controlled at this point, which she attributes to the allergy shots.  However, she has not gotten out of her Jodi [name] and therefore I am not convinced that this is providing any kind of relief for her.  She is going to come twice a week until her surgery to build up to a higher dose.  I think this will be very efficacious since spring is her worst time of the year.  We should be able to build up fairly safely during the winter months when the outdoor pollen counts are absent.  Also of note, she does report getting antibiotics around 4 times per year.  We could consider an immune deficiency work-up in the future if this trend continues.   Plan/Recommendations:   1. Moderate persistent asthma, uncomplicated - Lung testing actually looked quite good today.  - With all of the prednisone, you really should be on a daily controller medication. - Since Advair worked so well for so long, we will change to you the [name] version. - Information on Dupixent provided and consent form filled out. - Jodi [name] will be reaching out to you to discuss this.  - Hopefully we can get it approved without needing a complete  blood count (we cannot get one today since you are on prednisone currently) - Daily controller medication(s): Singulair 10mg  daily and Advair 230/1521mcg two puffs twice daily with spacer - Prior to physical activity: ProAir 2 puffs 10-15 minutes before physical activity. - Rescue medications: ProAir 4 puffs every 4-6 hours as needed - Asthma control goals:  * Full participation in all desired activities (may need albuterol before activity) * Albuterol use two time or less a week on average (not counting use with activity) * Cough interfering with sleep two time or less a month * Oral steroids no more than once a year * No hospitalizations  2. Chronic  rhinitis (weeds, grasses, indoor molds, outdoor molds, dust mites, cat and dog) - Continue with: Xyzal (levocetirizine) 5mg  tablet once daily, Singulair (montelukast) 10mg  daily and Flonase (fluticasone) two sprays per nostril daily  - You can use an extra dose of the antihistamine, if needed, for breakthrough symptoms.  - Consider nasal saline rinses 1-2 times daily to remove allergens from the nasal cavities as well as help with mucous clearance (this is especially helpful to do before the nasal sprays are given) - Continue with allergy shots at the same schedule.  . 3. Reflux - Continue with Prilosec daily.  4. Return in about 1 month (around 10/16/2018).   Subjective:   Jodi [name] is a 42 y.o. female presenting today for follow up of  Chief Complaint  Patient presents with  . Follow-up    Jodi [name] has a history of the following: Patient Active Problem List   Diagnosis Date Noted  . Moderate persistent asthma, uncomplicated 09/24/2017  . Seasonal and perennial allergic rhinitis 09/24/2017  . SI joint arthritis 02/21/2016  . SI (sacroiliac) joint dysfunction 09/04/2015  . Fall from horse 07/30/2015  . Lumbar transverse process fracture (HCC) 07/30/2015  . Pelvic ring fracture (HCC) 07/28/2015  . Primary generalized (osteo)arthritis 03/13/2015  . Asthma 11/17/2014  . Gastroesophageal reflux disease 11/17/2014  . Vitamin D deficiency 11/04/2014  . Dyslipidemia 08/11/2012  . 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 Allergic rhinitis 02/19/2010    History obtained from: chart and patient.  Jodi [name]'s Primary Care Provider is Jodi [name].     Jodi NiemannJaime is a 42 y.o. female presenting for a follow up visit.  She was last seen in the office in May 2019.  At that time, her lung testing looked quite good.  [facility] Symbicort due to a $0 co-pay card.  We also continued Singulair 10 mg daily.  She has history of perennial and seasonal allergic  rhinitis.  We continued Xyzal, Singulair, and Flonase.  We stopped the Dymista since she was complaining about the taste.  We continued Prilosec daily for her reflux.  Since the last visit, she mostly did well. However, last week she developed some shortness of breath and coughing. She felt that this worsened during the night time. She was started on a prednisone burst over the course of ten days or so. She as on them all of the time last year, but it has been better since we started seeing her. She has been on prednisone on a number of occasions since May 2019. She estimates that it has been around three or so.   She is no longer on Symbicort because her copay card did not work and Symbicort was much more expensive. She did not [facility] the [facility] since that never helped her at all. Advair had always  helped but then she could not tolerate the powder. She was on Breo but is unsure how well that worked. She had been on Advair for several years when it stopped working completely.   [place] is on allergen immunotherapy. She receives two injections. Immunotherapy script #1 contains grasses, dust mites, cat and dog. She currently receives 0.48mL of the BLUE vial (1/100,000). Immunotherapy script #2 contains ragweed and molds. She currently receives 0.46mL of the BLUE vial (1/100,000). She started shots April of 2019 and not yet reached maintenance. She has had problems making it up here for allergy injections.   She is going to undergo a spinal fusion soon, and this will be putting her back into the wheelchair. She will likely have problems getting here for allergy shots. Until this is scheduled, however, she is going to try to come in twice weekly to build up on her shots. In any case she does feel that the shots have helped her to feel better controlled from an allergic rhinitis perspective.   Otherwise, there have been no changes to her past medical history, surgical history, family history, or social  history.    Review of Systems: a 14-point review of systems is pertinent for what is mentioned in HPI.  Otherwise, all other systems were negative.  Constitutional: negative other than that listed in the HPI Eyes: negative other than that listed in the HPI Ears, nose, mouth, throat, and face: negative other than that listed in the HPI Respiratory: negative other than that listed in the HPI Cardiovascular: negative other than that listed in the HPI Gastrointestinal: negative other than that listed in the HPI Genitourinary: negative other than that listed in the HPI Integument: negative other than that listed in the HPI Hematologic: negative other than that listed in the HPI Musculoskeletal: negative other than that listed in the HPI Neurological: negative other than that listed in the HPI Allergy/Immunologic: negative other than that listed in the HPI    Objective:   Blood pressure 122/80, pulse (!) 101, resp. rate 18, height 5' 5.75" (1.67 m), weight 224 lb 3.2 oz (101.7 kg), last menstrual period 05/17/2012, SpO2 95 %. Body mass index is 36.46 kg/m.   Physical Exam:  General: Alert, interactive, in no acute distress. Talkative.  Eyes: No conjunctival injection bilaterally, no discharge on the right, no discharge on the left and no Horner-Trantas dots present. PERRL bilaterally. EOMI without pain. No photophobia.  Ears: Right TM pearly gray with normal light reflex, Left TM pearly gray with normal light reflex, Right TM intact without perforation and Left TM intact without perforation.  Nose/Throat: External nose within normal limits and septum midline. Turbinates edematous and pale with clear discharge. Posterior oropharynx mildly erythematous without cobblestoning in the posterior oropharynx. Tonsils 2+ without exudates.  Tongue without thrush. Lungs: Clear to auscultation without wheezing, rhonchi or rales. No increased work of breathing. CV: Normal S1/S2. No murmurs. Capillary  refill <2 seconds.  Skin: Warm and dry, without lesions or rashes. Neuro:   Grossly intact. No focal deficits appreciated. Responsive to questions.  Diagnostic studies:   Spirometry: results normal (FEV1: 2.88/119%, FVC: 3.27/104%, FEV1/FVC: 88%).    Spirometry consistent with normal pattern.  Allergy Studies: none        Malachi Bonds, [name]  [facility]

## 2018-10-14 DIAGNOSIS — J4551 Severe persistent asthma with (acute) exacerbation: Secondary | ICD-10-CM | POA: Diagnosis not present

## 2018-10-14 DIAGNOSIS — J301 Allergic rhinitis due to pollen: Secondary | ICD-10-CM | POA: Diagnosis not present

## 2018-10-14 DIAGNOSIS — M545 Low back pain: Secondary | ICD-10-CM | POA: Diagnosis not present

## 2018-10-27 ENCOUNTER — Ambulatory Visit: Payer: 59 | Admitting: Allergy & Immunology

## 2018-10-28 DIAGNOSIS — E669 Obesity, unspecified: Secondary | ICD-10-CM | POA: Diagnosis not present

## 2018-10-28 DIAGNOSIS — J4551 Severe persistent asthma with (acute) exacerbation: Secondary | ICD-10-CM | POA: Diagnosis not present

## 2018-10-28 DIAGNOSIS — M545 Low back pain: Secondary | ICD-10-CM | POA: Diagnosis not present

## 2018-11-04 DIAGNOSIS — M533 Sacrococcygeal disorders, not elsewhere classified: Secondary | ICD-10-CM | POA: Diagnosis not present

## 2018-11-04 DIAGNOSIS — M545 Low back pain: Secondary | ICD-10-CM | POA: Diagnosis not present

## 2018-11-04 DIAGNOSIS — G8929 Other chronic pain: Secondary | ICD-10-CM | POA: Diagnosis not present

## 2018-11-05 ENCOUNTER — Ambulatory Visit: Payer: 59 | Admitting: Allergy & Immunology

## 2018-11-11 ENCOUNTER — Telehealth: Payer: Self-pay | Admitting: *Deleted

## 2018-11-11 NOTE — Telephone Encounter (Signed)
L/M for patient to advise if she had received her Dupixent and for her to contact office to set up appt to get started on therapy

## 2018-11-19 ENCOUNTER — Ambulatory Visit: Payer: 59 | Admitting: Allergy & Immunology

## 2018-11-19 VITALS — BP 126/72 | HR 114 | Temp 98.5°F | Resp 20

## 2018-11-19 DIAGNOSIS — J3089 Other allergic rhinitis: Secondary | ICD-10-CM

## 2018-11-19 DIAGNOSIS — K219 Gastro-esophageal reflux disease without esophagitis: Secondary | ICD-10-CM | POA: Diagnosis not present

## 2018-11-19 DIAGNOSIS — J4540 Moderate persistent asthma, uncomplicated: Secondary | ICD-10-CM

## 2018-11-19 DIAGNOSIS — J302 Other seasonal allergic rhinitis: Secondary | ICD-10-CM

## 2018-11-19 MED ORDER — CLARITHROMYCIN 500 MG PO TABS: 500.0000 mg | ORAL_TABLET | Freq: Two times a day (BID) | ORAL | 0 refills | Status: AC

## 2018-11-19 MED ORDER — GUAIFENESIN-CODEINE 100-10 MG/5ML PO SYRP: 5.0000 mL | ORAL_SOLUTION | Freq: Three times a day (TID) | ORAL | 0 refills | Status: DC | PRN

## 2018-11-19 NOTE — Progress Notes (Signed)
FOLLOW UP  Date of Service/Encounter:  11/19/18   Assessment:   Moderate persistent asthma- likely with VCD component  Gastroesophageal reflux disease- on PPI  Seasonal and perennial allergic rhinitis(weeds, grasses, indoor molds, outdoor molds, dust mites, cat and dog)   Asthma Reportables: Severity:moderate persistent Risk:high due to multiple steroid courses (5-8 per year, per patient) Control:well controlled   Marijean NiemannJaime presents for a sick visit. She is no longer on her controller medication - due in part to cost it seems. We are going to change her back to Symbicort which has a $0 copay card, in hopes that she will remain compliant with this. For her rhinitis, she is going to get back onto her allergy shots once she is well. This should help control her allergies as well as her asthma. We are going to start the Dupixent and she will make an appointment to get that done.    Plan/Recommendations:   1. Moderate persistent asthma, uncomplicated - Lung testing deferred since you were coughing so much. - Start two puffs of Symbicort twice daily until it is used up (USE WITH SPACER).  - Daily controller medication(s): Singulair 10mg  daily + Dupixent every two weeks  - Prior to physical activity: ProAir 2 puffs 10-15 minutes before physical activity. - Rescue medications: ProAir 4 puffs every 4-6 hours as needed - Asthma control goals:  * Full participation in all desired activities (may need albuterol before activity) * Albuterol use two time or less a week on average (not counting use with activity) * Cough interfering with sleep two time or less a month * Oral steroids no more than once a year * No hospitalizations  2. Chronic rhinitis (weeds, grasses, indoor molds, outdoor molds, dust mites, cat and dog) - Continue with: Xyzal (levocetirizine) 5mg  tablet once daily, Singulair (montelukast) 10mg  daily and Flonase (fluticasone) two sprays per nostril daily  - You  can use an extra dose of the antihistamine, if needed, for breakthrough symptoms.  - Consider nasal saline rinses 1-2 times daily to remove allergens from the nasal cavities as well as help with mucous clearance (this is especially helpful to do before the nasal sprays are given) - Resume allergy shots when you are better.  . 3. Reflux - Continue with Prilosec daily.  4. Upper respiratory infection - Start clarithromycin 500mg  twice daily for 14 days. - Complete prednisone course.   - Start cough medicine 5mL every 8 hours as needed.   5. Return in about 3 months (around 02/17/2019).   Subjective:   Lenord FellersJaime M Laverdure is a 43 y.o. female presenting today for follow up of  Chief Complaint  Patient presents with  . Cough    post nasal drainage, cough with no productivity, runny nose, headaches.     Lenord FellersJaime M Yerby has a history of the following: Patient Active Problem List   Diagnosis Date Noted  . Moderate persistent asthma, uncomplicated 09/24/2017  . Seasonal and perennial allergic rhinitis 09/24/2017  . SI joint arthritis 02/21/2016  . SI (sacroiliac) joint dysfunction 09/04/2015  . Fall from horse 07/30/2015  . Lumbar transverse process fracture (HCC) 07/30/2015  . Pelvic ring fracture (HCC) 07/28/2015  . Primary generalized (osteo)arthritis 03/13/2015  . Asthma 11/17/2014  . Gastroesophageal reflux disease 11/17/2014  . Vitamin D deficiency 11/04/2014  . Dyslipidemia 08/11/2012  . 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 Allergic rhinitis 02/19/2010    History obtained from: chart review and patient.  Orlinda BlalockJaime M Falzone's Primary Care Provider is Juanetta GoslingHawkins,  Ramon Dredge, MD.     Kath is a 43 y.o. female presenting for a sick visit.  She was last seen in December 2019.  At that time, I felt that she needed to be on a daily controller medication.  She has failed multiple daily controllers in the past due to hoarseness.  We added on an Advair HFA 230/21 mcg 2 puffs twice daily and  continued Singulair 10 mg daily.  For her allergic rhinitis, we continued Xyzal, Singulair, and Flonase.  We also continued her on allergen immunotherapy.  Since the last visit, she has not done well. She tells me that her boyfriend had the "crud". She has had symptoms for over one month. She has tried using steroids and Tussionex from Dr. Juanetta Gosling without improvement in her symptoms. The Tussionex was helping at first and it started to get better and then get worse. She did get antibiotics around one month ago. She tells me that she feels that she is going to throw up. She has not been back to work in over one year. The cough is her main complaint at this time. She remains on prednisone and is the last few days of the taper. She finished the antibiotic a couple of weeks ago (she was on Keflex).   Asthma/Respiratory Symptom History: She is no longer on the Advair. This was going to cost $75. The only thing that she takes every day for her asthma is Singulair. She has failed Symbicort, Dulera, and Breo in the past. Advair worked well but with the cost she has not pursued this. She does have Dupixent on hand, but she has not started it yet since she is sick.   Allergic Rhinitis Symptom History: She remains on the fluticasone daily. She is on Xyzal daily. She is no longer on the shots. Her illness has been a problem with getting the shots.   Otherwise, there have been no changes to her past medical history, surgical history, family history, or social history.    Review of Systems: a 14-point review of systems is pertinent for what is mentioned in HPI.  Otherwise, all other systems were negative.  Constitutional: negative other than that listed in the HPI Eyes: negative other than that listed in the HPI Ears, nose, mouth, throat, and face: negative other than that listed in the HPI Respiratory: negative other than that listed in the HPI Cardiovascular: negative other than that listed in the  HPI Gastrointestinal: negative other than that listed in the HPI Genitourinary: negative other than that listed in the HPI Integument: negative other than that listed in the HPI Hematologic: negative other than that listed in the HPI Musculoskeletal: negative other than that listed in the HPI Neurological: negative other than that listed in the HPI Allergy/Immunologic: negative other than that listed in the HPI    Objective:   Blood pressure 126/72, pulse (!) 114, temperature 98.5 F (36.9 C), temperature source Oral, resp. rate 20, last menstrual period 05/17/2012, SpO2 97 %. There is no height or weight on file to calculate BMI.   Physical Exam:  General: Alert, interactive, in no acute distress. Pleasant female. Hoarse cough.  Eyes: No conjunctival injection bilaterally, no discharge on the right, no discharge on the left and no Horner-Trantas dots present. PERRL bilaterally. EOMI without pain. No photophobia.  Ears: Right TM pearly gray with normal light reflex, Left TM pearly gray with normal light reflex, Right TM intact without perforation and Left TM intact without perforation.  Nose/Throat: External  nose within normal limits and septum midline. Turbinates edematous and pale with clear discharge. Posterior oropharynx erythematous without cobblestoning in the posterior oropharynx. Tonsils 2+ without exudates.  Tongue without thrush. Lungs: Clear to auscultation without wheezing, rhonchi or rales. No increased work of breathing. CV: Normal S1/S2. No murmurs. Capillary refill <2 seconds.  Skin: Warm and dry, without lesions or rashes. Neuro:   Grossly intact. No focal deficits appreciated. Responsive to questions.  Diagnostic studies: none     Malachi BondsJoel Gallagher, MD  [facility] [place] [place] [place]

## 2018-11-19 NOTE — Patient Instructions (Addendum)
1. Moderate persistent asthma, uncomplicated - Lung testing deferred since you were coughing so much. - Start two puffs of Symbicort twice daily until it is used up (USE WITH SPACER).  - Daily controller medication(s): Singulair 10mg  daily + Dupixent every two weeks  - Prior to physical activity: ProAir 2 puffs 10-15 minutes before physical activity. - Rescue medications: ProAir 4 puffs every 4-6 hours as needed - Asthma control goals:  * Full participation in all desired activities (may need albuterol before activity) * Albuterol use two time or less a week on average (not counting use with activity) * Cough interfering with sleep two time or less a month * Oral steroids no more than once a year * No hospitalizations  2. Chronic rhinitis (weeds, grasses, indoor molds, outdoor molds, dust mites, cat and dog) - Continue with: Xyzal (levocetirizine) 5mg  tablet once daily, Singulair (montelukast) 10mg  daily and Flonase (fluticasone) two sprays per nostril daily  - You can use an extra dose of the antihistamine, if needed, for breakthrough symptoms.  - Consider nasal saline rinses 1-2 times daily to remove allergens from the nasal cavities as well as help with mucous clearance (this is especially helpful to do before the nasal sprays are given) - Resume allergy shots when you are better.  . 3. Reflux - Continue with Prilosec daily.  4. Upper respiratory infection - Start clarithromycin 500mg  twice daily for 14 days. - Complete prednisone course.   - Start cough medicine 17mL every 8 hours as needed.   5. Return in about 3 months (around 02/17/2019).   Please inform us of any Emergency Department visits, hospitalizations, or changes in symptoms. Call us before going to the ED for breathing or allergy symptoms since we might be able to fit you in for a sick visit. Feel free to contact us anytime with any questions, problems, or concerns.  It was a pleasure to see you again today!  Websites  that have reliable patient information: 1. American Academy of Asthma, Allergy, and Immunology: www.aaaai.org 2. Food Allergy Research and Education (FARE): foodallergy.org 3. Mothers of Asthmatics: http://www.asthmacommunitynetwork.org 4. American College of Allergy, Asthma, and Immunology: MissingWeapons.ca   Make sure you are registered to vote!

## 2018-11-22 ENCOUNTER — Encounter: Payer: Self-pay | Admitting: Allergy & Immunology

## 2018-11-29 DIAGNOSIS — J309 Allergic rhinitis, unspecified: Secondary | ICD-10-CM | POA: Diagnosis not present

## 2018-11-29 DIAGNOSIS — J4551 Severe persistent asthma with (acute) exacerbation: Secondary | ICD-10-CM | POA: Diagnosis not present

## 2018-11-29 DIAGNOSIS — M545 Low back pain: Secondary | ICD-10-CM | POA: Diagnosis not present

## 2018-11-30 DIAGNOSIS — M47818 Spondylosis without myelopathy or radiculopathy, sacral and sacrococcygeal region: Secondary | ICD-10-CM | POA: Diagnosis not present

## 2018-12-15 DIAGNOSIS — Z4789 Encounter for other orthopedic aftercare: Secondary | ICD-10-CM | POA: Diagnosis not present

## 2018-12-15 DIAGNOSIS — M533 Sacrococcygeal disorders, not elsewhere classified: Secondary | ICD-10-CM | POA: Diagnosis not present

## 2018-12-15 DIAGNOSIS — M47818 Spondylosis without myelopathy or radiculopathy, sacral and sacrococcygeal region: Secondary | ICD-10-CM | POA: Diagnosis not present

## 2019-01-04 DIAGNOSIS — J4551 Severe persistent asthma with (acute) exacerbation: Secondary | ICD-10-CM | POA: Diagnosis not present

## 2019-01-04 DIAGNOSIS — M545 Low back pain: Secondary | ICD-10-CM | POA: Diagnosis not present

## 2019-01-04 DIAGNOSIS — J309 Allergic rhinitis, unspecified: Secondary | ICD-10-CM | POA: Diagnosis not present

## 2019-02-18 ENCOUNTER — Ambulatory Visit (INDEPENDENT_AMBULATORY_CARE_PROVIDER_SITE_OTHER): Payer: 59 | Admitting: Allergy & Immunology

## 2019-02-18 ENCOUNTER — Other Ambulatory Visit: Payer: Self-pay

## 2019-02-18 ENCOUNTER — Encounter: Payer: Self-pay | Admitting: Allergy & Immunology

## 2019-02-18 ENCOUNTER — Ambulatory Visit: Payer: 59 | Admitting: Allergy & Immunology

## 2019-02-18 VITALS — BP 112/88 | HR 106 | Resp 16

## 2019-02-18 DIAGNOSIS — K219 Gastro-esophageal reflux disease without esophagitis: Secondary | ICD-10-CM | POA: Diagnosis not present

## 2019-02-18 DIAGNOSIS — J4540 Moderate persistent asthma, uncomplicated: Secondary | ICD-10-CM | POA: Diagnosis not present

## 2019-02-18 DIAGNOSIS — J302 Other seasonal allergic rhinitis: Secondary | ICD-10-CM | POA: Diagnosis not present

## 2019-02-18 DIAGNOSIS — J3089 Other allergic rhinitis: Secondary | ICD-10-CM | POA: Diagnosis not present

## 2019-02-18 NOTE — Progress Notes (Signed)
FOLLOW UP  Date of Service/Encounter:  02/18/19   Assessment:   Moderate persistent asthma- likely with VCD component  Gastroesophageal reflux disease- on PPI  Seasonal and perennial allergic rhinitis(weeds, grasses, indoor molds, outdoor molds, dust mites, cat and dog)  Chronic back pain - on disability from [employer] at this time   Asthma Reportables: Severity:moderate persistent Risk:high due to multiple steroid courses (5-8 per year, per patient) Control:well controlled   Jodi [name] presents for a follow up visit. Fortunately, we do have her Dupixent in the office todayso we are going to do ahead and administer her dose. I reiterated that she needed to be on the Dupixent as a steroid sparing agent, as the continuous prednisone bursts will only make her underlying bone disease worse. She seems to understand this. She is going to have a couple of second opinions regarding her back pain. She would prefer to get the issue fixed and continue to work, but her current neurosurgeon reportedly does not think that the surgery would be curative and might not even help transiently. In the meantime, we are going to get her back on her allergen immunotherapy and continue with the Dupixent every two weeks. She does feel comfortable giving herself a shot at home, but I recommended that she come back with her next dose so that we can provide more instruction on how to administer it.     Plan/Recommendations:   1. Moderate persistent asthma, uncomplicated - I am glad that you are getting the Dupixent started. - This should help you avoid much of the prednisone.  - Your Dupixent is provided through [facility] pharmacy. - Call SUSAN at [phone] or main number [phone] to let them now that they can ship the next dose for administration in two weeks. - Make an appointment in two weeks for a Dupixent injection and we can teach you how to give it to yourself.  - Daily  controller medication(s): Singulair 10mg  daily and Advair 230/12mcg two puffs twice daily with spacer + Dupixent every two weeks  - Prior to physical activity: ProAir 2 puffs 10-15 minutes before physical activity. - Rescue medications: ProAir 4 puffs every 4-6 hours as needed - Asthma control goals:  * Full participation in all desired activities (may need albuterol before activity) * Albuterol use two time or less a week on average (not counting use with activity) * Cough interfering with sleep two time or less a month * Oral steroids no more than once a year * No hospitalizations  2. Chronic rhinitis (weeds, grasses, indoor molds, outdoor molds, dust mites, cat and dog) - Continue with: Xyzal (levocetirizine) 5mg  tablet once daily as needed, Singulair (montelukast) 10mg  daily and Flonase (fluticasone) two sprays per nostril daily  - You can use an extra dose of the antihistamine, if needed, for breakthrough symptoms.  - Consider nasal saline rinses 1-2 times daily to remove allergens from the nasal cavities as well as help with mucous clearance (this is especially helpful to do before the nasal sprays are given) - Resume allergy shots when you are better.   3. Reflux - Continue with Prilosec daily.  4. Return in about 6 weeks (around 04/01/2019).   Subjective:   Jodi [name] is a 43 y.o. female presenting today for follow up of  Chief Complaint  Patient presents with  . Asthma    wheezing occasionally   . Cough    comes and goes    Jodi [name] has a  history of the following: Patient Active Problem List   Diagnosis Date Noted  . Moderate persistent asthma, uncomplicated 09/24/2017  . Seasonal and perennial allergic rhinitis 09/24/2017  . SI joint arthritis 02/21/2016  . SI (sacroiliac) joint dysfunction 09/04/2015  . Fall from horse 07/30/2015  . Lumbar transverse process fracture (HCC) 07/30/2015  . Pelvic ring fracture (HCC) 07/28/2015  . Primary generalized  (osteo)arthritis 03/13/2015  . Asthma 11/17/2014  . Gastroesophageal reflux disease 11/17/2014  . Vitamin D deficiency 11/04/2014  . Dyslipidemia 08/11/2012  . 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 Allergic rhinitis 02/19/2010    History obtained from: chart review and patient.  Jodi [name] is a 43 y.o. female presenting for a follow up visit. She was last seen in February 2020 after a fairly long hiatus. She was sick at the time and I gave her another prednisone burst. We continued with Singulair and recommended that she go ahead and start the Dupixent. She has a history of P/SAR and we continue with Xyzal as well as Singulair and Flonase. In addition to the prednisone, we started her on a course of clarithromycin.   She has needed prednisone twice since the last visit. She does well when she is on the prednisone but then as she weans off her symptoms return.   Asthma/Respiratory Symptom History: She is currently on Advair 230/21 two puffs twice daily. This is now $75 per month. She has failed Symbicort and Dulera. She might have been on Breo and she is unsure if it worked. However she prefers to avoid dry powder inhalers. She is using albuterol around a few times per week. She has not started the Dupixent yet (she got her first injection today finally).    Allergic Rhinitis Symptom History: She remains on the Flonase daily. She was on Dymista in the past. When we sw her in February she was actually sick and required clarithromycin as well as azithromycin. She also got two courses of prednisone.   She tells me that she is supposed to have back surgery at some point, but she has been told that she doe snot have enough bone left to do the surgery. She was told this by an orthopedic surgeon at [facility] (Dr. Ala Dach). She is going to have some second opinions but this is on hold due to COVID19. She is on alendronate weekly to help with bone mineralization.   She is no longer working at this point. She is on  disability from [place] [employer] but she is working on getting full disability. She would prefer to have surgery and get back to work. Otherwise, there have been no changes to her past medical history, surgical history, family history, or social history.    Review of Systems  Constitutional: Negative.  Negative for fever, malaise/fatigue and weight loss.  HENT: Negative.  Negative for congestion, ear discharge and ear pain.   Eyes: Negative for pain, discharge and redness.  Respiratory: Negative for cough, sputum production, shortness of breath and wheezing.   Cardiovascular: Negative.  Negative for chest pain and palpitations.  Gastrointestinal: Negative for abdominal pain and heartburn.  Skin: Negative.  Negative for itching and rash.  Neurological: Negative for dizziness and headaches.  Endo/Heme/Allergies: Negative for environmental allergies. Does not bruise/bleed easily.       Objective:   Blood pressure 112/88, pulse (!) 106, resp. rate 16, last menstrual period 05/17/2012, SpO2 99 %. There is no height or weight on file to calculate BMI.   Physical Exam:  Physical Exam  Constitutional: She appears well-developed.  Very pleasant female.   HENT:  Head: Normocephalic and atraumatic.  Right Ear: Tympanic membrane, external ear and ear canal normal.  Left Ear: Tympanic membrane and ear canal normal.  Nose: No mucosal edema, rhinorrhea, nasal deformity or septal deviation. No epistaxis. Right sinus exhibits no maxillary sinus tenderness and no frontal sinus tenderness. Left sinus exhibits no maxillary sinus tenderness and no frontal sinus tenderness.  Mouth/Throat: Uvula is midline and oropharynx is clear and moist. Mucous membranes are not pale and not dry.  Eyes: Pupils are equal, round, and reactive to light. Conjunctivae and EOM are normal. Right eye exhibits no chemosis and no discharge. Left eye exhibits no chemosis and no discharge. Right conjunctiva is not injected.  Left conjunctiva is not injected.  Cardiovascular: Normal rate, regular rhythm and normal heart sounds.  Respiratory: Effort normal and breath sounds normal. No accessory muscle usage. No tachypnea. No respiratory distress. She has no wheezes. She has no rhonchi. She has no rales. She exhibits no tenderness.  Moving air well in all lung fields.  Lymphadenopathy:    She has no cervical adenopathy.  Neurological: She is alert.  Skin: No abrasion, no petechiae and no rash noted. Rash is not papular, not vesicular and not urticarial. No erythema. No pallor.  Psychiatric: She has a normal mood and affect.     Diagnostic studies: none   First dose of Dupixent administered in clinic today.        Malachi BondsJoel Gallagher, MD  [facility]

## 2019-02-18 NOTE — Progress Notes (Signed)
Immunotherapy   Patient Details  Name: Jodi [name] MRN: [id number] Date of Birth: 1976/08/11  02/18/2019  Lenord Fellers started Dupixent for Asthma. Patient received loading dose 600 mg. Patient waited in office for 30 minutes with no problem.  Frequency: Every 14 days Epi-Pen: Yes Consent signed and patient instructions given.   Dub Mikes 02/18/2019, 2:37 PM

## 2019-02-18 NOTE — Progress Notes (Signed)
Patient is being seen in the office to receive dupixent and f/u

## 2019-02-18 NOTE — Patient Instructions (Addendum)
1. Moderate persistent asthma, uncomplicated - I am glad that you are getting the Dupixent started. - This should help you avoid much of the prednisone.  - Your Dupixent is provided through [facility] pharmacy. - Call SUSAN at [phone] or main number [phone] to let them now that they can ship the next dose for administration in two weeks. - Make an appointment in two weeks for a Dupixent injection and we can teach you how to give it to yourself.  - Daily controller medication(s): Singulair 10mg  daily and Advair 230/42mcg two puffs twice daily with spacer + Dupixent every two weeks  - Prior to physical activity: ProAir 2 puffs 10-15 minutes before physical activity. - Rescue medications: ProAir 4 puffs every 4-6 hours as needed - Asthma control goals:  * Full participation in all desired activities (may need albuterol before activity) * Albuterol use two time or less a week on average (not counting use with activity) * Cough interfering with sleep two time or less a month * Oral steroids no more than once a year * No hospitalizations  2. Chronic rhinitis (weeds, grasses, indoor molds, outdoor molds, dust mites, cat and dog) - Continue with: Xyzal (levocetirizine) 5mg  tablet once daily as needed, Singulair (montelukast) 10mg  daily and Flonase (fluticasone) two sprays per nostril daily  - You can use an extra dose of the antihistamine, if needed, for breakthrough symptoms.  - Consider nasal saline rinses 1-2 times daily to remove allergens from the nasal cavities as well as help with mucous clearance (this is especially helpful to do before the nasal sprays are given) - Resume allergy shots when you are better.   3. Reflux - Continue with Prilosec daily.  4. Return in about 6 weeks (around 04/01/2019).   Please inform us of any Emergency Department visits, hospitalizations, or changes in symptoms. Call us before going to the ED for breathing or allergy symptoms since we might be able to  fit you in for a sick visit. Feel free to contact us anytime with any questions, problems, or concerns.  It was a pleasure to see you again today!  Websites that have reliable patient information: 1. American Academy of Asthma, Allergy, and Immunology: www.aaaai.org 2. Food Allergy Research and Education (FARE): foodallergy.org 3. Mothers of Asthmatics: http://www.asthmacommunitynetwork.org 4. American College of Allergy, Asthma, and Immunology: MissingWeapons.ca   Make sure you are registered to vote!

## 2019-02-20 ENCOUNTER — Encounter: Payer: Self-pay | Admitting: Allergy & Immunology

## 2019-02-24 DIAGNOSIS — J3089 Other allergic rhinitis: Secondary | ICD-10-CM

## 2019-02-24 NOTE — Progress Notes (Signed)
VIALS EXP 02-24-2020

## 2019-03-01 DIAGNOSIS — J301 Allergic rhinitis due to pollen: Secondary | ICD-10-CM | POA: Diagnosis not present

## 2019-03-04 ENCOUNTER — Ambulatory Visit: Payer: Self-pay

## 2019-03-09 ENCOUNTER — Ambulatory Visit (INDEPENDENT_AMBULATORY_CARE_PROVIDER_SITE_OTHER): Payer: 59

## 2019-03-09 DIAGNOSIS — J4550 Severe persistent asthma, uncomplicated: Secondary | ICD-10-CM | POA: Diagnosis not present

## 2019-03-09 NOTE — Progress Notes (Signed)
Immunotherapy   Patient Details  Name: CINTHIA SELMER MRN: [id number] Date of Birth: March 28, 1976  03/09/2019  Lenord Fellers Patient received second injection  Following schedule: Dupixent, patient is giving injection at home from now on.  Frequency:Every 14 days  Epi-Pen: Yes  Consent signed and patient instructions given.   Florence Canner 03/09/2019, 11:29 AM

## 2019-03-11 ENCOUNTER — Ambulatory Visit (INDEPENDENT_AMBULATORY_CARE_PROVIDER_SITE_OTHER): Payer: 59

## 2019-03-11 ENCOUNTER — Other Ambulatory Visit: Payer: Self-pay

## 2019-03-11 DIAGNOSIS — J309 Allergic rhinitis, unspecified: Secondary | ICD-10-CM | POA: Diagnosis not present

## 2019-03-11 NOTE — Progress Notes (Signed)
Immunotherapy   Patient Details  Name: Jodi [name] MRN: [id number] Date of Birth: 1976/09/07  03/11/2019  Lenord Fellers restarted her allergy injections today. Patient received 0.05 ml of both her blue vials. One with Grass-Cat-Dog-DM and the other with Mold-RW. Patient waited 30 minutes in an exam room with no problems. Following schedule: B Frequency: 1-2 times weekly Epi-Pen: Yes Consent signed and patient instructions given.   Dub Mikes 03/11/2019, 1:37 PM

## 2019-03-14 ENCOUNTER — Other Ambulatory Visit: Payer: Self-pay | Admitting: Allergy & Immunology

## 2019-03-18 ENCOUNTER — Ambulatory Visit (INDEPENDENT_AMBULATORY_CARE_PROVIDER_SITE_OTHER): Payer: 59

## 2019-03-18 DIAGNOSIS — J309 Allergic rhinitis, unspecified: Secondary | ICD-10-CM | POA: Diagnosis not present

## 2019-03-24 ENCOUNTER — Other Ambulatory Visit (HOSPITAL_COMMUNITY): Payer: Self-pay | Admitting: Unknown Physician Specialty

## 2019-03-24 DIAGNOSIS — Z1231 Encounter for screening mammogram for malignant neoplasm of breast: Secondary | ICD-10-CM

## 2019-03-30 ENCOUNTER — Other Ambulatory Visit: Payer: Self-pay

## 2019-03-30 ENCOUNTER — Encounter: Payer: Self-pay | Admitting: Allergy & Immunology

## 2019-03-30 ENCOUNTER — Ambulatory Visit: Payer: 59 | Admitting: Allergy & Immunology

## 2019-03-30 VITALS — BP 118/68 | HR 102 | Temp 97.7°F | Resp 18

## 2019-03-30 DIAGNOSIS — J4540 Moderate persistent asthma, uncomplicated: Secondary | ICD-10-CM

## 2019-03-30 DIAGNOSIS — K219 Gastro-esophageal reflux disease without esophagitis: Secondary | ICD-10-CM

## 2019-03-30 DIAGNOSIS — J309 Allergic rhinitis, unspecified: Secondary | ICD-10-CM

## 2019-03-30 DIAGNOSIS — J302 Other seasonal allergic rhinitis: Secondary | ICD-10-CM

## 2019-03-30 DIAGNOSIS — J3089 Other allergic rhinitis: Secondary | ICD-10-CM

## 2019-03-30 DIAGNOSIS — H1010 Acute atopic conjunctivitis, unspecified eye: Secondary | ICD-10-CM

## 2019-03-30 NOTE — Patient Instructions (Addendum)
1. Moderate persistent asthma, uncomplicated - It seems that you are doing well with the [place]. - We may consider decreasing your Advair dosing once you have been stable for a longer period of time. - Daily controller medication(s): Singulair 10mg  daily and Advair 230/42mcg two puffs twice daily with spacer + Dupixent every two weeks  - Prior to physical activity: ProAir 2 puffs 10-15 minutes before physical activity. - Rescue medications: ProAir 4 puffs every 4-6 hours as needed - Asthma control goals:  * Full participation in all desired activities (may need albuterol before activity) * Albuterol use two time or less a week on average (not counting use with activity) * Cough interfering with sleep two time or less a month * Oral steroids no more than once a year * No hospitalizations  2. Chronic rhinitis (weeds, grasses, indoor molds, outdoor molds, dust mites, cat and dog) - Continue with: Xyzal (levocetirizine) 5mg  tablet once daily, Singulair (montelukast) 10mg  daily and Flonase (fluticasone) two sprays per nostril daily  - Try making the Xyzal as needed.  - You can use an extra dose of the antihistamine, if needed, for breakthrough symptoms.  - Consider nasal saline rinses 1-2 times daily to remove allergens from the nasal cavities as well as help with mucous clearance (this is especially helpful to do before the nasal sprays are given) - Continue with allergy shots at the same schedule.  3. Reflux - Continue with Prilosec daily.  4. Return in about 3 months (around 06/30/2019).   Please inform us of any Emergency Department visits, hospitalizations, or changes in symptoms. Call us before going to the ED for breathing or allergy symptoms since we might be able to fit you in for a sick visit. Feel free to contact us anytime with any questions, problems, or concerns.  It was a pleasure to see you again today!  Websites that have reliable patient information: 1. American Academy  of Asthma, Allergy, and Immunology: www.aaaai.org 2. Food Allergy Research and Education (FARE): foodallergy.org 3. Mothers of Asthmatics: http://www.asthmacommunitynetwork.org 4. American College of Allergy, Asthma, and Immunology: MonthlyElectricBill.co.uk   Make sure you are registered to vote!

## 2019-03-30 NOTE — Progress Notes (Signed)
FOLLOW UP  Date of Service/Encounter:  03/30/19   Assessment:   Moderate persistent asthma- likely with VCD component  Gastroesophageal reflux disease- on PPI  Seasonal and perennial allergic rhinitis(weeds, grasses, indoor molds, outdoor molds, dust mites, cat and dog)  Chronic back pain - on disability from [employer] at this time   Asthma Reportables: Severity:moderate persistent Risk:high due to multiple steroid courses (5-8 per year, per patient) Control:well controlled   Jodi [name] presents for follow-up visit.  She has started the Dupixent since last visit has prevented her from needing any prednisone at all.  She did feel a fairly quick improvement in her shortness of breath.  She has had no problems with the injections.  We are to continue with the same regimen for now.  I would like to decrease her Advair at some point, but I am not sure now is the right time to do that.  For her allergic rhinitis, she has started the shots, but is still in the very dilute [employer] Vial.  She has had no adverse events to the shots.  Has been improvement from her allergy shots at this point, but I did tell her it is rather early for this.  She continues to work for permanent disability, but I do not believe we have been asked for any records, at least to my knowledge.   Plan/Recommendations:   1. Moderate persistent asthma, uncomplicated - It seems that you are doing well with the Dupixent. - We may consider decreasing your Advair dosing once you have been stable for a longer period of time. - Daily controller medication(s): Singulair 10mg  daily and Advair 230/4421mcg two puffs twice daily with spacer + Dupixent every two weeks  - Prior to physical activity: ProAir 2 puffs 10-15 minutes before physical activity. - Rescue medications: ProAir 4 puffs every 4-6 hours as needed - Asthma control goals:  * Full participation in all desired activities (may need albuterol before  activity) * Albuterol use two time or less a week on average (not counting use with activity) * Cough interfering with sleep two time or less a month * Oral steroids no more than once a year * No hospitalizations  2. Chronic rhinitis (weeds, grasses, indoor molds, outdoor molds, dust mites, cat and dog) - Continue with: Xyzal (levocetirizine) 5mg  tablet once daily, Singulair (montelukast) 10mg  daily and Flonase (fluticasone) two sprays per nostril daily  - Try making the Xyzal as needed.  - You can use an extra dose of the antihistamine, if needed, for breakthrough symptoms.  - Consider nasal saline rinses 1-2 times daily to remove allergens from the nasal cavities as well as help with mucous clearance (this is especially helpful to do before the nasal sprays are given) - Continue with allergy shots at the same schedule.  3. Reflux - Continue with Prilosec daily.  4. Return in about 3 months (around 06/30/2019).   Subjective:   Jodi [name] is a 43 y.o. female presenting today for follow up of  Chief Complaint  Patient presents with  . Asthma    states that her asthma has been okay. she has not really had to use her rescue inhaler.   . Allergic Rhinitis     injections are doing good. no reactions or issues with those.     Jodi [name] has a history of the following: Patient Active Problem List   Diagnosis Date Noted  . Moderate persistent asthma, uncomplicated 09/24/2017  . Seasonal and perennial  allergic rhinitis 09/24/2017  . SI joint arthritis 02/21/2016  . SI (sacroiliac) joint dysfunction 09/04/2015  . Fall from horse 07/30/2015  . Lumbar transverse process fracture ([place]) 07/30/2015  . Pelvic ring fracture ([place]) 07/28/2015  . Primary generalized (osteo)arthritis 03/13/2015  . Asthma 11/17/2014  . Gastroesophageal reflux disease 11/17/2014  . Vitamin D deficiency 11/04/2014  . Dyslipidemia 08/11/2012  . Lung granuloma ([place]) 07/22/2011  . Allergic rhinitis  02/19/2010    History obtained from: chart review and patient.  Jodi [name] is a 43 y.o. female presenting for a follow up visit.  She was last seen in May 2020.  At that time, we officially started the [place].  We continued Singulair 10 mg daily and Advair 230/21 mcg 2 puffs twice daily.  For her rhinitis, we continued Xyzal, Singulair, and Flonase.  We also recommended that she restart allergy shots at some point when she had improved.  Since last visit, she has done very well.  Asthma/Respiratory Symptom History: She remains on her Dupixent every 2 weeks.  She does feel like this is provided quite a bit of relief for her.  She remains on her Advair 2 puffs twice daily as well.  She has not used her rescue inhaler at all since last visit.  She has not needed any prednisone bursts.  She endorses good sleep at night.  Allergic Rhinitis Symptom History: She remains on her Singulair.  She is also on the Xyzal and the Flonase.  She has had no issues with her allergy shots.  She remains in the [facility].  She has had 3 or 4 shots in total thus far.  She denies any large local reactions.  She remains on her reflux medication.  She denies any symptoms or heartburn.  She has not needed to use any Tums.  She tells me that she is supposed to have back surgery at some point, but she has been told that she does not have enough bone left to do the surgery. She was told this by an orthopedic surgeon at [facility] (Dr. Margarita Rana). She is going to have some second opinions but this is on hold due to [place]. She is on alendronate weekly to help with bone mineralization.   She is no longer working at this point. She is on disability from [place] but she is working on getting full disability. She would prefer to have surgery and get back to work. Otherwise, there have been no changes to her past medical history, surgical history, family history, or social history.    Review of Systems  Constitutional:  Negative.  Negative for chills, fever, malaise/fatigue and weight loss.  HENT: Negative.  Negative for congestion, ear discharge, ear pain, nosebleeds and sore throat.   Eyes: Negative for pain, discharge and redness.  Respiratory: Negative for cough, sputum production, shortness of breath and wheezing.   Cardiovascular: Negative.  Negative for chest pain and palpitations.  Gastrointestinal: Negative for abdominal pain, heartburn, nausea and vomiting.  Skin: Negative.  Negative for itching and rash.  Neurological: Negative for dizziness and headaches.  Endo/Heme/Allergies: Negative for environmental allergies. Does not bruise/bleed easily.       Objective:   Blood pressure 118/68, pulse (!) 102, temperature 97.7 F (36.5 C), temperature source Temporal, resp. rate 18, last menstrual period 05/17/2012, SpO2 97 %. There is no height or weight on file to calculate BMI.   Physical Exam:  Physical Exam  Constitutional: She appears well-developed.  Pleasant well-appearing female.  HENT:  Head: Normocephalic and atraumatic.  Right Ear: Tympanic membrane, external ear and ear canal normal. No drainage, swelling or tenderness. Tympanic membrane is not injected, not scarred, not erythematous, not retracted and not bulging.  Left Ear: Tympanic membrane, external ear and ear canal normal. No drainage, swelling or tenderness. Tympanic membrane is not injected, not scarred, not erythematous, not retracted and not bulging.  Nose: Mucosal edema and rhinorrhea present. No nasal deformity or septal deviation. No epistaxis. Right sinus exhibits no maxillary sinus tenderness and no frontal sinus tenderness. Left sinus exhibits no maxillary sinus tenderness and no frontal sinus tenderness.  Mouth/Throat: Uvula is midline and oropharynx is clear and moist. Mucous membranes are not pale and not dry.  No epistaxis noted.  Turbinates are enlarged bilaterally.  Eyes: Pupils are equal, round, and reactive to  light. Conjunctivae and EOM are normal. Right eye exhibits no chemosis and no discharge. Left eye exhibits no chemosis and no discharge. Right conjunctiva is not injected. Left conjunctiva is not injected.  Allergic shiners bilaterally.  Cardiovascular: Normal rate, regular rhythm and normal heart sounds.  Respiratory: Effort normal and breath sounds normal. No accessory muscle usage. No tachypnea. No respiratory distress. She has no wheezes. She has no rhonchi. She has no rales. She exhibits no tenderness.  Moving air well in all lung fields.  No increased work of breathing.  GI: There is no abdominal tenderness. There is no rebound and no guarding.  Lymphadenopathy:       Head (right side): No submandibular, no tonsillar and no occipital adenopathy present.       Head (left side): No submandibular, no tonsillar and no occipital adenopathy present.    She has no cervical adenopathy.  Neurological: She is alert.  Skin: No abrasion, no petechiae and no rash noted. Rash is not papular, not vesicular and not urticarial. No erythema. No pallor.  Psychiatric: She has a normal mood and affect.     Diagnostic studies: none       Malachi BondsJoel Xavian Hardcastle, MD  [facility]

## 2019-04-06 ENCOUNTER — Ambulatory Visit (HOSPITAL_COMMUNITY)
Admission: RE | Admit: 2019-04-06 | Discharge: 2019-04-06 | Disposition: A | Discharge status: Home / Self Care | Payer: 59 | Source: Ambulatory Visit | Attending: Unknown Physician Specialty | Admitting: Unknown Physician Specialty

## 2019-04-06 ENCOUNTER — Other Ambulatory Visit: Payer: Self-pay

## 2019-04-06 ENCOUNTER — Ambulatory Visit (INDEPENDENT_AMBULATORY_CARE_PROVIDER_SITE_OTHER): Payer: 59

## 2019-04-06 DIAGNOSIS — Z1231 Encounter for screening mammogram for malignant neoplasm of breast: Secondary | ICD-10-CM | POA: Diagnosis present

## 2019-04-06 DIAGNOSIS — J309 Allergic rhinitis, unspecified: Secondary | ICD-10-CM | POA: Diagnosis not present

## 2019-04-12 ENCOUNTER — Other Ambulatory Visit (HOSPITAL_COMMUNITY): Payer: Self-pay | Admitting: Pulmonary Disease

## 2019-04-12 ENCOUNTER — Other Ambulatory Visit: Payer: Self-pay | Admitting: Pulmonary Disease

## 2019-04-12 DIAGNOSIS — M7989 Other specified soft tissue disorders: Secondary | ICD-10-CM

## 2019-04-12 DIAGNOSIS — R609 Edema, unspecified: Secondary | ICD-10-CM

## 2019-04-14 ENCOUNTER — Other Ambulatory Visit: Payer: Self-pay

## 2019-04-14 ENCOUNTER — Ambulatory Visit (HOSPITAL_BASED_OUTPATIENT_CLINIC_OR_DEPARTMENT_OTHER)
Admission: RE | Admit: 2019-04-14 | Discharge: 2019-04-14 | Disposition: A | Discharge status: Home / Self Care | Payer: 59 | Source: Ambulatory Visit | Attending: Pulmonary Disease | Admitting: Pulmonary Disease

## 2019-04-14 ENCOUNTER — Ambulatory Visit (HOSPITAL_COMMUNITY)
Admission: RE | Admit: 2019-04-14 | Discharge: 2019-04-14 | Disposition: A | Discharge status: Home / Self Care | Payer: 59 | Source: Ambulatory Visit | Attending: Pulmonary Disease | Admitting: Pulmonary Disease

## 2019-04-14 DIAGNOSIS — R609 Edema, unspecified: Secondary | ICD-10-CM | POA: Diagnosis not present

## 2019-04-14 DIAGNOSIS — M7989 Other specified soft tissue disorders: Secondary | ICD-10-CM | POA: Diagnosis present

## 2019-04-14 NOTE — Progress Notes (Signed)
Echocardiogram 2D Echocardiogram has been performed.  Jodi [name] 04/14/2019, 11:15 AM

## 2019-06-24 ENCOUNTER — Ambulatory Visit (INDEPENDENT_AMBULATORY_CARE_PROVIDER_SITE_OTHER): Payer: 59

## 2019-06-24 DIAGNOSIS — J309 Allergic rhinitis, unspecified: Secondary | ICD-10-CM | POA: Diagnosis not present

## 2019-06-29 ENCOUNTER — Other Ambulatory Visit: Payer: Self-pay

## 2019-06-29 ENCOUNTER — Ambulatory Visit (INDEPENDENT_AMBULATORY_CARE_PROVIDER_SITE_OTHER): Payer: 59 | Admitting: Allergy & Immunology

## 2019-06-29 ENCOUNTER — Encounter: Payer: Self-pay | Admitting: Allergy & Immunology

## 2019-06-29 VITALS — BP 140/88 | HR 96 | Temp 98.3°F | Resp 18 | Ht 66.0 in | Wt 225.0 lb

## 2019-06-29 DIAGNOSIS — J3089 Other allergic rhinitis: Secondary | ICD-10-CM | POA: Diagnosis not present

## 2019-06-29 DIAGNOSIS — J309 Allergic rhinitis, unspecified: Secondary | ICD-10-CM

## 2019-06-29 DIAGNOSIS — J302 Other seasonal allergic rhinitis: Secondary | ICD-10-CM | POA: Diagnosis not present

## 2019-06-29 NOTE — Patient Instructions (Addendum)
1. Moderate persistent asthma, uncomplicated - It seems that you are doing well with the [place]. - We did not do lung testing since you were having such bad back pain. - Dupixent is not indicated for a monotherapy controller medication for your asthma, so you need to take a daily inhaled steroid at least once daily.  - Daily controller medication(s): Singulair 10mg  daily and Advair 230/79mcg two puffs twice daily with spacer + Dupixent every two weeks  - Prior to physical activity: albuterol 2 puffs 10-15 minutes before physical activity. - Rescue medications: albuterol 4 puffs every 4-6 hours as needed - Changes during respiratory infections or worsening symptoms: Increase Advair 230/28mcg to 2 puffs twice daily for TWO WEEKS. - Asthma control goals:  * Full participation in all desired activities (may need albuterol before activity) * Albuterol use two time or less a week on average (not counting use with activity) * Cough interfering with sleep two time or less a month * Oral steroids no more than once a year * No hospitalizations  2. Chronic rhinitis (weeds, grasses, indoor molds, outdoor molds, dust mites, cat and dog) - Continue with: Xyzal (levocetirizine) 5mg  tablet once daily as needed, Singulair (montelukast) 10mg  daily and Flonase (fluticasone) two sprays per nostril daily  - Continue with allergy shots at the same schedule.  3. Reflux - Continue with Prilosec daily.  4. Return in about 3 months (around 09/28/2019).   Please inform us of any Emergency Department visits, hospitalizations, or changes in symptoms. Call us before going to the ED for breathing or allergy symptoms since we might be able to fit you in for a sick visit. Feel free to contact us anytime with any questions, problems, or concerns.  It was a pleasure to see you again today!  Websites that have reliable patient information: 1. American Academy of Asthma, Allergy, and Immunology: www.aaaai.org 2. Food  Allergy Research and Education (FARE): foodallergy.org 3. Mothers of Asthmatics: http://www.asthmacommunitynetwork.org 4. American College of Allergy, Asthma, and Immunology: MonthlyElectricBill.co.uk   Make sure you are registered to vote!

## 2019-06-29 NOTE — Progress Notes (Signed)
FOLLOW UP  Date of Service/Encounter:  06/29/19   Assessment:   Moderate persistent asthma- likely with VCD component  Gastroesophageal reflux disease- on PPI  Seasonal and perennial allergic rhinitis(weeds, grasses, indoor molds, outdoor molds, dust mites, cat and dog)  Chronic back pain - on disability from [place] and [place] at this time    Ms. Jodi [name] presents for a follow up visit. She is doing remarkably well on the Dupixent every two weeks. In fact, she has even stopped taking her Advair since she does not feel that she needs it at this time. She has not required any prednisone or ED visits for her symptoms at all. Allergy shots are controlling her allergic rhinitis and likely contributing to the control of her asthma as well. We are going to continue with this for now. I did emphasize that she should take at least two puffs of the Advair daily, as Dupixent is not approved as a monotherapy for asthma.    Plan/Recommendations:   1. Moderate persistent asthma, uncomplicated - It seems that you are doing well with the Dupixent. - We did not do lung testing since you were having such bad back pain. - Dupixent is not indicated for a monotherapy controller medication for your asthma, so you need to take a daily inhaled steroid at least once daily.  - Daily controller medication(s): Singulair 10mg  daily and Advair 230/72mcg two puffs twice daily with spacer + Dupixent every two weeks  - Prior to physical activity: albuterol 2 puffs 10-15 minutes before physical activity. - Rescue medications: albuterol 4 puffs every 4-6 hours as needed - Changes during respiratory infections or worsening symptoms: Increase Advair 230/28mcg to 2 puffs twice daily for TWO WEEKS. - Asthma control goals:  * Full participation in all desired activities (may need albuterol before activity) * Albuterol use two time or less a week on average (not counting use with activity) * Cough interfering  with sleep two time or less a month * Oral steroids no more than once a year * No hospitalizations  2. Chronic rhinitis (weeds, grasses, indoor molds, outdoor molds, dust mites, cat and dog) - Continue with: Xyzal (levocetirizine) 5mg  tablet once daily as needed, Singulair (montelukast) 10mg  daily and Flonase (fluticasone) two sprays per nostril daily  - Continue with allergy shots at the same schedule.  3. Reflux - Continue with Prilosec daily.  4. Return in about 3 months (around 09/28/2019).   Subjective:   Jodi [name] is a 43 y.o. female presenting today for follow up of  Chief Complaint  Patient presents with   Asthma    no issues. no albuterol use for at minimum a couple of months. says that since starting Dupixent her asthma has been good. even when she has flares she is not really bad.     Jodi [name] has a history of the following: Patient Active Problem List   Diagnosis Date Noted   Moderate persistent asthma, uncomplicated 09/24/2017   Seasonal and perennial allergic rhinitis 09/24/2017   SI joint arthritis 02/21/2016   SI (sacroiliac) joint dysfunction 09/04/2015   Fall from horse 07/30/2015   Lumbar transverse process fracture (HCC) 07/30/2015   Pelvic ring fracture (HCC) 07/28/2015   Primary generalized (osteo)arthritis 03/13/2015   Asthma 11/17/2014   Gastroesophageal reflux disease 11/17/2014   Vitamin D deficiency 11/04/2014   Dyslipidemia 08/11/2012   Lung granuloma (HCC) 07/22/2011   Allergic rhinitis 02/19/2010    History obtained from: chart review and patient.  Jodi [name] is a 43 y.o. female presenting for a follow up visit.  She was last seen in June 2020.  At that time, she was doing very well.  She had started the [place], which have provided additional prednisone.  Her shortness of breath improved fairly quickly with initiation of the [place].  We did discuss decreasing her Advair at this visit if she continued to do well.   We continued the Singulair 10 mg daily and Advair 230/21 mcg 2 puffs twice daily with a spacer.  For her allergic rhinitis, we continued Xyzal, Singulair, and Flonase.  We continued her allergy shots at the same schedule.  For her reflux, we continued Prilosec daily.  Since last visit, she has done very well. She is moving rather slowly today as she is experiencing some back pain. She is going to have a pre-surgery clearance appointment on September 22nd, and she will learn the plan at that time regarding the date of the surgery. They are going to need to take some bone from her hip and put it in her back.   Asthma/Respiratory Symptom History: She is using only the Dupixent every two weeks. She has not needed any albuterol and in fact she has stopped taking her Advair. ACT is 22, indicating excellent asthma control. She has not needed any ED visits or hospitalizations for her asthma.   Allergic Rhinitis Symptom History: She remains on her Singulair and Xyzal. She does not use the fluticasone regularly. Allergy shots are going well but she has not been coming in very frequently.   Otherwise, there have been no changes to her past medical history, surgical history, family history, or social history.    Review of Systems  Constitutional: Negative.  Negative for fever, malaise/fatigue and weight loss.  HENT: Negative.  Negative for congestion, ear discharge and ear pain.   Eyes: Negative for pain, discharge and redness.  Respiratory: Negative for cough, sputum production, shortness of breath and wheezing.   Cardiovascular: Negative.  Negative for chest pain and palpitations.  Gastrointestinal: Negative for abdominal pain, constipation, diarrhea, heartburn, nausea and vomiting.  Musculoskeletal: Positive for back pain and joint pain.  Skin: Negative.  Negative for itching and rash.  Neurological: Negative for dizziness and headaches.  Endo/Heme/Allergies: Negative for environmental allergies. Does  not bruise/bleed easily.       Objective:   Blood pressure 140/88, pulse 96, temperature 98.3 F (36.8 C), temperature source Temporal, resp. rate 18, height 5\' 6"  (1.676 m), weight 225 lb (102.1 kg), last menstrual period 05/17/2012, SpO2 97 %. Body mass index is 36.32 kg/m.   Physical Exam:  Physical Exam  Vitals reviewed. Constitutional: She appears well-developed.  HENT:  Head: Normocephalic and atraumatic.  Right Ear: Tympanic membrane, external ear and ear canal normal.  Left Ear: Tympanic membrane and ear canal normal.  Nose: No mucosal edema, rhinorrhea, nasal deformity or septal deviation. No epistaxis. Right sinus exhibits no maxillary sinus tenderness and no frontal sinus tenderness. Left sinus exhibits no maxillary sinus tenderness and no frontal sinus tenderness.  Mouth/Throat: Uvula is midline and oropharynx is clear and moist. Mucous membranes are not pale and not dry.  Eyes: Pupils are equal, round, and reactive to light. Conjunctivae and EOM are normal. Right eye exhibits no chemosis and no discharge. Left eye exhibits no chemosis and no discharge. Right conjunctiva is not injected. Left conjunctiva is not injected.  Cardiovascular: Normal rate, regular rhythm and normal heart sounds.  Respiratory: Effort normal and breath sounds normal.  No accessory muscle usage. No tachypnea. No respiratory distress. She has no wheezes. She has no rhonchi. She has no rales. She exhibits no tenderness.  Lymphadenopathy:    She has no cervical adenopathy.  Neurological: She is alert.  Skin: No abrasion, no petechiae and no rash noted. Rash is not papular, not vesicular and not urticarial. No erythema. No pallor.  Psychiatric: She has a normal mood and affect.     Diagnostic studies: none     Malachi BondsJoel Josanne Boerema, MD  [facility] [place]

## 2019-07-15 ENCOUNTER — Ambulatory Visit (INDEPENDENT_AMBULATORY_CARE_PROVIDER_SITE_OTHER): Payer: 59

## 2019-07-15 DIAGNOSIS — J309 Allergic rhinitis, unspecified: Secondary | ICD-10-CM | POA: Diagnosis not present

## 2019-07-20 ENCOUNTER — Other Ambulatory Visit: Payer: Self-pay | Admitting: Allergy & Immunology

## 2019-07-22 ENCOUNTER — Ambulatory Visit (INDEPENDENT_AMBULATORY_CARE_PROVIDER_SITE_OTHER): Payer: 59

## 2019-07-22 DIAGNOSIS — J309 Allergic rhinitis, unspecified: Secondary | ICD-10-CM

## 2019-09-23 MED ORDER — Medication
2.00 | Status: DC
Start: 2019-09-23 — End: 2019-09-23

## 2019-09-23 MED ORDER — DICLOXACILLIN SODIUM 62.5 MG/5ML PO SUSR
10.00 | ORAL | Status: DC
Start: ? — End: 2019-09-23

## 2019-09-23 MED ORDER — DURACLON EP
1000.00 | EPIDURAL | Status: DC
Start: 2019-09-24 — End: 2019-09-23

## 2019-09-23 MED ORDER — ENEMA 7-19 GM/118ML RE ENEM
1.00 | ENEMA | RECTAL | Status: DC
Start: ? — End: 2019-09-23

## 2019-09-23 MED ORDER — PANTOPRAZOLE SODIUM 40 MG PO TBEC
40.00 | DELAYED_RELEASE_TABLET | ORAL | Status: DC
Start: 2019-09-24 — End: 2019-09-23

## 2019-09-23 MED ORDER — Medication
30.00 | Status: DC
Start: 2019-09-23 — End: 2019-09-23

## 2019-09-23 MED ORDER — HORIZON PUMP SYRINGE ADD-ON MISC
10.00 | Status: DC
Start: 2019-09-24 — End: 2019-09-23

## 2019-09-23 MED ORDER — METHYLPHENIDATE HCL POWD
50.00 | Status: DC
Start: ? — End: 2019-09-23

## 2019-09-23 MED ORDER — OXYCODONE HCL 5 MG PO TABS
5.00 | ORAL_TABLET | ORAL | Status: DC
Start: ? — End: 2019-09-23

## 2019-09-23 MED ORDER — MEDI-TUSSIN DM DOUBLE STRENGTH 30-200 MG/5ML PO LIQD
1000.00 | ORAL | Status: DC
Start: 2019-09-24 — End: 2019-09-23

## 2019-09-23 MED ORDER — DAILY VITAMINS/IRON PO
10.00 | ORAL | Status: DC
Start: ? — End: 2019-09-23

## 2019-09-23 MED ORDER — ACCU-PRO PUMP SET/VENT MISC
2.50 | Status: DC
Start: ? — End: 2019-09-23

## 2019-09-23 MED ORDER — MEDIOTIC-HC 1-1-0.1 % OT SOLN
5.00 | OTIC | Status: DC
Start: 2019-09-24 — End: 2019-09-23

## 2019-09-23 MED ORDER — BD ECLIPSE SYRINGE 22G X 1" 3 ML MISC
150.00 | Status: DC
Start: 2019-09-24 — End: 2019-09-23

## 2019-09-23 MED ORDER — CALCIWISE PO
50.00 | ORAL | Status: DC
Start: 2019-09-23 — End: 2019-09-23

## 2019-09-23 MED ORDER — RICOLA HERB MT
80.00 | OROMUCOSAL | Status: DC
Start: 2019-09-23 — End: 2019-09-23

## 2019-09-23 MED ORDER — PROMETHAZINE HCL (BULK CHEMICALS - P'S)
25.00 | Status: DC
Start: ? — End: 2019-09-23

## 2019-09-23 MED ORDER — Medication
500.00 | Status: DC
Start: 2019-09-24 — End: 2019-09-23

## 2019-09-23 MED ORDER — ONE-A-DAY WITHIN PO
30.00 | ORAL | Status: DC
Start: ? — End: 2019-09-23

## 2019-09-23 MED ORDER — HCA HYDROGEN PEROXIDE 3 % EX SOLN
950.00 | CUTANEOUS | Status: DC
Start: 2019-09-24 — End: 2019-09-23

## 2019-09-23 MED ORDER — Medication
40.00 | Status: DC
Start: 2019-09-23 — End: 2019-09-23

## 2019-09-28 ENCOUNTER — Ambulatory Visit: Payer: 59 | Admitting: Allergy & Immunology

## 2019-12-20 ENCOUNTER — Encounter: Payer: Self-pay | Admitting: Allergy & Immunology

## 2019-12-21 ENCOUNTER — Telehealth (INDEPENDENT_AMBULATORY_CARE_PROVIDER_SITE_OTHER): Payer: 59 | Admitting: Allergy & Immunology

## 2019-12-21 ENCOUNTER — Ambulatory Visit: Payer: 59 | Admitting: Allergy & Immunology

## 2019-12-21 ENCOUNTER — Encounter: Payer: Self-pay | Admitting: Allergy & Immunology

## 2019-12-21 DIAGNOSIS — J302 Other seasonal allergic rhinitis: Secondary | ICD-10-CM

## 2019-12-21 DIAGNOSIS — J4541 Moderate persistent asthma with (acute) exacerbation: Secondary | ICD-10-CM | POA: Diagnosis not present

## 2019-12-21 DIAGNOSIS — J3089 Other allergic rhinitis: Secondary | ICD-10-CM

## 2019-12-21 MED ORDER — PREDNISONE 10 MG PO TABS: ORAL_TABLET | ORAL | 0 refills | Status: DC

## 2019-12-21 MED ORDER — LEVALBUTEROL TARTRATE 45 MCG/ACT IN AERO: 2.0000 | INHALATION_SPRAY | RESPIRATORY_TRACT | 2 refills | Status: DC | PRN

## 2019-12-21 NOTE — Progress Notes (Signed)
RE: Jodi [name] MRN: [id number] DOB: 02/24/1976 Date of Telemedicine Visit: 12/21/2019  Referring provider: [name], MD Primary care provider: [name], MD  Chief Complaint: Asthma   Telemedicine Follow Up Visit via WebEx: I connected with Jodi [name] for a follow up on 12/21/19 by MyChart Epic Video and verified that I am speaking with the correct person using two identifiers.   I discussed the limitations, risks, security and privacy concerns of performing an evaluation and management service by telemedicine and the availability of in person appointments. I also discussed with the patient that there may be a patient responsible charge related to this service. The patient expressed understanding and agreed to proceed.  Patient is at home.  Provider is at the office.  Visit start time: 10:13 AM Visit end time: 10:34 AM Insurance consent/check in by: [facility] consent and medical assistant/nurse: Venissa  History of Present Illness:  She is a 44 y.o. female, who is being followed for moderate persistent asthma with a VCD component as well as perennial and seasonal allergic rhinitis. Her previous allergy office visit was in September 2020 with myself.  At that time, she was doing well with the Dupixent for her breathing.  We did not do lung testing since she was endorsing some back pain.  We continued with Advair 230/21 mcg 2 puffs twice daily as well as Dupixent every 2 weeks and Singulair 10 mg daily.  For her rhinitis, would continue with Xyzal as well as Singulair and Flonase.  She was also on allergen immunotherapy.  Since last visit, she has mostly done well.  Unfortunately, she has not been back to see [place] because she had back surgery.  This was somewhat complicated but she is getting around a little more lately.  She is planning to back in starting her shots next week.  In the last few days, she has noticed that her breathing is worse.  She tells me she  has been getting outdoors more and she knows that the tree pollen is a lot higher level than it has been lately.  She thinks this is what is triggering her symptoms.  She has been using her rescue inhaler, although she does not use it as much as she needs it because it causes some high heart rates.  It also makes her anxiety a lot worse because of this.  Asthma/Respiratory Symptom History: She used her rescue inhaler last week and she is doing better this week. She has missed some Dupixent as well. She did take one last week to get back on track. She is on the Advair two puffs BID as well. She is having more of her symptoms at night now with waking up with coughing fits. Last night, she did fairly well. Sunday she was starting to feel better. She does not have Xopenex.   Allergic Rhinitis Symptom History: She remains on the Xyzal as well as her Flonase.  She is on the Singulair as well.  She has not required any antibiotics for sinus infection since last visit.  She is interested in restarting her allergen immunotherapy.  She has never gotten out of her blue vial.  Otherwise, there have been no changes to her past medical history, surgical history, family history, or social history.  Assessment and Plan:  Jodi [name] is a 44 y.o. female with:  Moderate persistent asthma- likely with VCD component with acute exacerbation  Gastroesophageal reflux disease- on PPI  Seasonal and perennial allergic rhinitis(weeds, grasses, indoor  molds, outdoor molds, dust mites, cat and dog)  Chronic back pain - on disability from [place] and [place] at this time   Jodi [name] presents for sick visit over the phone.  She is clearly out of breath during our conversation.  We are going to change her from albuterol to Xopenex to help with the tachycardia side effects.  I think she would do better if she were able to use her rescue inhaler as often as she needs it, especially during this acute event.  I did talk to her  about the use of steroids after surgery, and she tells me that she talk to her orthopedic surgeon about this.  Her orthopedic surgeon said it was fine for her to use prednisone if needed for her breathing at this point postop.  Therefore, we are going to send in a small dose of prednisone to be tapered over the course of 1 week.  She is going to be more vigilant with her Dupixent.  She believes she has plenty of refills on her other medications.  She is also going to come in next week for resumption of her allergen immunotherapy.  She did make an appointment for this.  Diagnostics: None.  Medication List:  Current Outpatient Medications  Medication Sig Dispense Refill  . albuterol (PROVENTIL HFA;VENTOLIN HFA) 108 (90 BASE) MCG/ACT inhaler Inhale 2 puffs into the lungs every 6 (six) hours as needed for wheezing or shortness of breath. 18 g 0  . alendronate (FOSAMAX) 70 MG tablet Take 70 mg by mouth once a week.    . calcium citrate (CALCITRATE - DOSED IN MG ELEMENTAL CALCIUM) 950 (200 Ca) MG tablet Take 1 tablet (950 mg total) by mouth daily.    . Cholecalciferol 25 MCG (1000 UT) tablet Take by mouth.    . desipramine (NORPRAMIN) 50 MG tablet Take 50 mg by mouth at bedtime.    . Dupilumab (DUPIXENT) 300 MG/2ML SOSY Inject 300 mg into the skin every 14 (fourteen) days. 4 mL 10  . fluticasone (FLONASE) 50 MCG/ACT nasal spray Use 2 spray(s) in each nostril once daily 16 g 3  . furosemide (LASIX) 40 MG tablet Take 40 mg by mouth daily as needed.    Marland Kitchen ipratropium-albuterol (DUONEB) 0.5-2.5 (3) MG/3ML SOLN Take 3 mLs by nebulization every 6 (six) hours as needed (shortness of breath).     Marland Kitchen levocetirizine (XYZAL) 5 MG tablet TAKE ONE TABLET BY MOUTH ONCE DAILY IN THE EVENING 30 tablet 0  . meloxicam (MOBIC) 15 MG tablet Take by mouth.    . montelukast (SINGULAIR) 10 MG tablet TAKE ONE TABLET BY MOUTH IN THE MORNING 30 tablet 0  . omeprazole (PRILOSEC) 20 MG capsule Take 1 capsule (20 mg total) by mouth  daily. 30 capsule 5   No current facility-administered medications for this visit.   Allergies: Allergies  Allergen Reactions  . Prochlorperazine Anaphylaxis and Other (See Comments)    (Compazine) Can not control facial muscles, face swells, tongue sticks out, lost oral control Can not control facial muscles Other reaction(s): Other (See Comments) Lost oral control Face swells, tongue sticks out    I reviewed her past medical history, social history, family history, and environmental history and no significant changes have been reported from previous visits.  Review of Systems  Constitutional: Negative for activity change, appetite change, chills and diaphoresis.  HENT: Positive for sinus pressure and sneezing. Negative for congestion, postnasal drip, rhinorrhea, sore throat and voice change.   Eyes: Negative  for pain, discharge, redness and itching.  Respiratory: Positive for cough, shortness of breath and wheezing. Negative for stridor.   Gastrointestinal: Negative for diarrhea, nausea and vomiting.  Endocrine: Negative for cold intolerance.  Musculoskeletal: Positive for back pain. Negative for arthralgias, joint swelling and myalgias.  Skin: Negative for rash.  Allergic/Immunologic: Positive for environmental allergies. Negative for food allergies and immunocompromised state.    Objective:  Physical exam not obtained as encounter was done via telephone.   Previous notes and tests were reviewed.  I discussed the assessment and treatment plan with the patient. The patient was provided an opportunity to ask questions and all were answered. The patient agreed with the plan and demonstrated an understanding of the instructions.   The patient was advised to call back or seek an in-person evaluation if the symptoms worsen or if the condition fails to improve as anticipated.  I provided 21 minutes of non-face-to-face time during this encounter.  It was my pleasure to  participate in Foley Palacios's care today. Please feel free to contact me with any questions or concerns.   Sincerely,  Alfonse Spruce, MD

## 2019-12-26 ENCOUNTER — Other Ambulatory Visit: Payer: Self-pay | Admitting: *Deleted

## 2019-12-26 ENCOUNTER — Encounter: Payer: Self-pay | Admitting: Allergy & Immunology

## 2019-12-26 MED ORDER — LEVOCETIRIZINE DIHYDROCHLORIDE 5 MG PO TABS: 5.0000 mg | ORAL_TABLET | Freq: Every day | ORAL | 5 refills | Status: DC | PRN

## 2020-01-04 ENCOUNTER — Ambulatory Visit (INDEPENDENT_AMBULATORY_CARE_PROVIDER_SITE_OTHER): Payer: 59

## 2020-01-04 ENCOUNTER — Other Ambulatory Visit: Payer: Self-pay

## 2020-01-04 ENCOUNTER — Encounter: Payer: Self-pay | Admitting: Allergy & Immunology

## 2020-01-04 VITALS — BP 126/84 | HR 96 | Temp 97.7°F | Resp 20

## 2020-01-04 DIAGNOSIS — J302 Other seasonal allergic rhinitis: Secondary | ICD-10-CM | POA: Diagnosis not present

## 2020-01-04 DIAGNOSIS — J3089 Other allergic rhinitis: Secondary | ICD-10-CM

## 2020-01-04 DIAGNOSIS — J4541 Moderate persistent asthma with (acute) exacerbation: Secondary | ICD-10-CM

## 2020-01-04 MED ORDER — FLUTICASONE-SALMETEROL 115-21 MCG/ACT IN AERO: 2.0000 | INHALATION_SPRAY | Freq: Two times a day (BID) | RESPIRATORY_TRACT | 5 refills | Status: DC

## 2020-01-04 MED ORDER — IPRATROPIUM BROMIDE 0.02 % IN SOLN: 0.5000 mg | RESPIRATORY_TRACT | 1 refills | Status: AC | PRN

## 2020-01-04 NOTE — Progress Notes (Signed)
FOLLOW UP  Date of Service/Encounter:  01/04/20   Assessment:   Moderate persistent asthma- likely with VCD component with acute VCD  exacerbation   Gastroesophageal reflux disease- on PPI  Seasonal and perennial allergic rhinitis(weeds, grasses, indoor molds, outdoor molds, dust mites, cat and dog)  Chronic back pain - on disability from [place] at this time   Jodi [name] actually presented today to restart her allergy shots.  Unfortunately, she was breathing uncomfortably in the lobby so we added her on is a sick visit.  On exam, she is tachypneic but moving air throughout the lung fields.  She does have a voice change notable of a somewhat higher pitch.  She has having a whisper during the visit today.  Her spirometry actually looks quite normal, but the FEV1 did improve significantly with the Atrovent treatment.  She has been using albuterol every 2-3 hours today, therefore recommended that she stop the albuterol and use Atrovent for the next day or two to allow her lungs to ramp up expression of the beta receptors.  I conjecture that these have been down regulated due to the constant albuterol exposure.  She did feel better after the Atrovent treatment.  The inspiratory loop today is not flattened, but I still have a strong suspicion for vocal cord dysfunction.  Therefore, we obtained an appointment with Dr. Vilinda Blanks at [facility] next week.  We are going to continue prednisone for now out of an abundance of caution, as she may have a coexisting asthma exacerbation with VCD.  We have rescheduled her allergy shots start date for the Monday after Easter.   Plan/Recommendations:   1. Moderate persistent asthma, uncomplicated - Lung testing actually looked great today, but there was a lot of improvement with the Atrovent treatment. - I think that you are experiencing a vocal cord dysfunction episode in conjunction with an asthma exacerbation.  - We did give you  nebulizer Atrovent today, which can calm these episodes. - Since you did improve with the nebulizer treatment, so we will continue with prednisone until your appointment next week.  - We are going to refer you to see an ENT that works with vocal disorders. - Karena Addison has already put the referral in.  - Appointment:  01/12/2020 at 9:40am ([address])  [place], [place] [address]  [phone] - Daily controller medication(s): Singulair 10mg  daily and Advair 230/49mcg two puffs twice daily with spacer + Dupixent every two weeks  - Prior to physical activity: albuterol 2 puffs 10-15 minutes before physical activity. - Rescue medications: albuterol 4 puffs every 4-6 hours as needed - Changes during respiratory infections or worsening symptoms: Increase Advair 230/96mcg to 2 puffs twice daily for TWO WEEKS. - Asthma control goals:  * Full participation in all desired activities (may need albuterol before activity) * Albuterol use two time or less a week on average (not counting use with activity) * Cough interfering with sleep two time or less a month * Oral steroids no more than once a year * No hospitalizations  2. Chronic rhinitis (weeds, grasses, indoor molds, outdoor molds, dust mites, cat and dog) - Continue with: Xyzal (levocetirizine) 5mg  tablet once daily as needed, Singulair (montelukast) 10mg  daily and Flonase (fluticasone) two sprays per nostril daily  - Come back in one week to restart allergy shots.   3. Reflux - Continue with Prilosec daily.  4. Return in about 4 weeks (around 02/01/2020). This can be an  in-person, a virtual Webex or a telephone follow up visit.   Subjective:   Jodi [name] is a 44 y.o. female presenting today for follow up of  Chief Complaint  Patient presents with  . asthma flare    3 weeks. SOB and cough    Jodi [name] has a history of the following: Patient Active Problem List   Diagnosis Date Noted  . Moderate  persistent asthma, uncomplicated 09/24/2017  . Seasonal and perennial allergic rhinitis 09/24/2017  . SI joint arthritis 02/21/2016  . SI (sacroiliac) joint dysfunction 09/04/2015  . Fall from horse 07/30/2015  . Lumbar transverse process fracture (HCC) 07/30/2015  . Pelvic ring fracture (HCC) 07/28/2015  . Primary generalized (osteo)arthritis 03/13/2015  . Asthma 11/17/2014  . Gastroesophageal reflux disease 11/17/2014  . Vitamin D deficiency 11/04/2014  . Dyslipidemia 08/11/2012  . 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 Allergic rhinitis 02/19/2010    History obtained from: chart review and patient.  Jodi [name] is a 44 y.o. female presenting for a follow up visit.  She was seen via video visit around 2 weeks ago.  At that time, she was clearly out of breath during the conversation.  We changed her from albuterol to Xopenex since she was experiencing some tachycardia from the albuterol.  Did not feel she was using her rescue inhaler as much as she needed to, so I told her to not hesitate to use it while she recovered.  We did send in a small dose of prednisone with a taper over 6 days.  We recommended that she restart her allergy shots, which brings her in today.  For her GERD, we continued with the PPI.  Since last visit, she has developed a rebound in her symptoms.  Her symptoms had originally started prior to our March 10 visit. I had given her a prednisone taper and she completed it. She was feeling better for a few days, but then it came back after this. She continues to have a dry cough and "wheezing". She endorses tightness in her chest. She is using her albuterol every 2-3 hours. She did use some leftover prednisone last night (from another provider) and today and she does not have any prednisone left.  She does feel that the prednisone has loosened her up.  She is using her Advair two puffs BID. She did restart Dupixent two weeks ago. She denies any fevers or other issues.  She denies any  COVID-19 exposures.  She denies any loss of taste or smell.  Allergic rhinitis remains stable with some sneezing and postnasal drip. She was planning to restart her allergy shots today. She remains on her Xyzal and Singulair.  She is also on Flonase.  Otherwise, there have been no changes to her past medical history, surgical history, family history, or social history.    Review of Systems  Constitutional: Negative.  Negative for chills, fever, malaise/fatigue and weight loss.  HENT: Negative.  Negative for congestion, ear discharge, ear pain and sore throat.   Eyes: Negative for pain, discharge and redness.  Respiratory: Negative for cough, sputum production, shortness of breath and wheezing.   Cardiovascular: Negative.  Negative for chest pain and palpitations.  Gastrointestinal: Negative for abdominal pain, constipation, diarrhea, heartburn, nausea and vomiting.  Skin: Negative.  Negative for itching and rash.  Neurological: Negative for dizziness and headaches.  Endo/Heme/Allergies: Negative for environmental allergies. Does not bruise/bleed easily.       Objective:   Blood pressure 126/84, pulse 96,  temperature 97.7 F (36.5 C), temperature source Temporal, resp. rate 20, last menstrual period 05/17/2012, SpO2 97 %. There is no height or weight on file to calculate BMI.   Physical Exam:  Physical Exam  Constitutional: She appears well-developed.  Tachypneic initially, but does calm down during the visit.  HENT:  Head: Normocephalic and atraumatic.  Right Ear: Tympanic membrane, external ear and ear canal normal.  Left Ear: Tympanic membrane, external ear and ear canal normal.  Nose: Rhinorrhea present. No mucosal edema, nasal deformity or septal deviation. No epistaxis. Right sinus exhibits no maxillary sinus tenderness and no frontal sinus tenderness. Left sinus exhibits no maxillary sinus tenderness and no frontal sinus tenderness.  Mouth/Throat: Uvula is midline and  oropharynx is clear and moist. Mucous membranes are not pale and not dry.  Eyes: Pupils are equal, round, and reactive to light. Conjunctivae and EOM are normal. Right eye exhibits no chemosis and no discharge. Left eye exhibits no chemosis and no discharge. Right conjunctiva is not injected. Left conjunctiva is not injected.  Cardiovascular: Normal rate, regular rhythm and normal heart sounds.  Respiratory: Breath sounds normal. Accessory muscle usage present. No tachypnea. She is in respiratory distress. She exhibits no tenderness.  Somewhat high-pitched voice today.  She is moving air at the bases.  I do not appreciate any wheezing or crackles, but she is initially using her accessory muscles for respiration.  She does appear to need deep breaths between sentences, but again this improves over the course of the visit.  Lymphadenopathy:    She has no cervical adenopathy.  Neurological: She is alert.  Skin: No abrasion, no petechiae and no rash noted. Rash is not papular, not vesicular and not urticarial. No erythema. No pallor.  Psychiatric: She has a normal mood and affect.     Diagnostic studies:    Spirometry: results normal (FEV1: 1.91/73%, FVC: 2.52/78%, FEV1/FVC: 76%).    Spirometry consistent with normal pattern.  We did give an Atrovent treatment via nebulizer with an improvement in her FEV1 which increased from 1.91L to 2.70 L, an increase of 41%.  Her FVC increased from 2.52 L to 2.96 L, which is an increase of 17%.   Allergy Studies: none        Malachi Bonds, MD  [facility]

## 2020-01-04 NOTE — Patient Instructions (Addendum)
1. Moderate persistent asthma, uncomplicated - Lung testing actually looked great today, but there was a lot of improvement with the Atrovent treatment. - I think that you are experiencing a vocal cord dysfunction episode in conjunction with an asthma exacerbation.  - We did give you nebulizer Atrovent today, which can calm these episodes. - Since you did improve with the nebulizer treatment, so we will continue with prednisone until your appointment next week.  - We are going to refer you to see an ENT that works with vocal disorders. - Geraldine Contras has already put the referral in.  - Appointment:  01/12/2020 at 9:40am ([address] [address])  [address]  [place], [place] [address]  [phone] - Daily controller medication(s): Singulair 10mg  daily and Advair 230/66mcg two puffs twice daily with spacer + Dupixent every two weeks  - Prior to physical activity: albuterol 2 puffs 10-15 minutes before physical activity. - Rescue medications: albuterol 4 puffs every 4-6 hours as needed - Changes during respiratory infections or worsening symptoms: Increase Advair 230/44mcg to 2 puffs twice daily for TWO WEEKS. - Asthma control goals:  * Full participation in all desired activities (may need albuterol before activity) * Albuterol use two time or less a week on average (not counting use with activity) * Cough interfering with sleep two time or less a month * Oral steroids no more than once a year * No hospitalizations  2. Chronic rhinitis (weeds, grasses, indoor molds, outdoor molds, dust mites, cat and dog) - Continue with: Xyzal (levocetirizine) 5mg  tablet once daily as needed, Singulair (montelukast) 10mg  daily and Flonase (fluticasone) two sprays per nostril daily  - Come back in one week to restart allergy shots.   3. Reflux - Continue with Prilosec daily.  4. Return in about 4 weeks (around 02/01/2020). This can be an in-person, a virtual Webex or a telephone follow up visit.   Please  inform of any Emergency Department visits, hospitalizations, or changes in symptoms. Call before going to the ED for breathing or allergy symptoms since we might be able to fit you in for a sick visit. Feel free to contact 02/03/2020 anytime with any questions, problems, or concerns.  It was a pleasure to see you again today!  Websites that have reliable patient information: 1. American Academy of Asthma, Allergy, and Immunology: www.aaaai.org 2. Food Allergy Research and Education (FARE): foodallergy.org 3. Mothers of Asthmatics: http://www.asthmacommunitynetwork.org 4. American College of Allergy, Asthma, and Immunology: www.acaai.org   COVID-19 Vaccine Information can be found at: [place] For questions related to vaccine distribution or appointments, please email [email or web address] or call [phone].     "Like" [place] on Facebook and Instagram for our latest updates!       HAPPY SPRING!  Make sure you are registered to vote! If you have moved or changed any of your contact information, you will need to get this updated before voting!  In some cases, you MAY be able to register to vote online: [email or web address]

## 2020-01-05 ENCOUNTER — Encounter: Payer: Self-pay | Admitting: Allergy & Immunology

## 2020-01-05 NOTE — Progress Notes (Signed)
No sir that will be all.  Thanks

## 2020-01-16 ENCOUNTER — Other Ambulatory Visit: Payer: Self-pay

## 2020-01-16 ENCOUNTER — Ambulatory Visit (INDEPENDENT_AMBULATORY_CARE_PROVIDER_SITE_OTHER): Payer: 59

## 2020-01-16 DIAGNOSIS — J309 Allergic rhinitis, unspecified: Secondary | ICD-10-CM

## 2020-01-18 ENCOUNTER — Ambulatory Visit (INDEPENDENT_AMBULATORY_CARE_PROVIDER_SITE_OTHER): Payer: 59

## 2020-01-18 DIAGNOSIS — J309 Allergic rhinitis, unspecified: Secondary | ICD-10-CM | POA: Diagnosis not present

## 2020-01-27 ENCOUNTER — Ambulatory Visit (INDEPENDENT_AMBULATORY_CARE_PROVIDER_SITE_OTHER): Payer: 59

## 2020-01-27 DIAGNOSIS — J309 Allergic rhinitis, unspecified: Secondary | ICD-10-CM | POA: Diagnosis not present

## 2020-02-03 ENCOUNTER — Ambulatory Visit: Payer: Self-pay

## 2020-02-03 ENCOUNTER — Encounter: Payer: Self-pay | Admitting: Allergy & Immunology

## 2020-02-03 ENCOUNTER — Other Ambulatory Visit: Payer: Self-pay

## 2020-02-03 ENCOUNTER — Ambulatory Visit (INDEPENDENT_AMBULATORY_CARE_PROVIDER_SITE_OTHER): Payer: 59 | Admitting: Allergy & Immunology

## 2020-02-03 VITALS — BP 100/60 | HR 100 | Temp 98.4°F | Resp 18

## 2020-02-03 DIAGNOSIS — J302 Other seasonal allergic rhinitis: Secondary | ICD-10-CM

## 2020-02-03 DIAGNOSIS — J4540 Moderate persistent asthma, uncomplicated: Secondary | ICD-10-CM

## 2020-02-03 DIAGNOSIS — J383 Other diseases of vocal cords: Secondary | ICD-10-CM

## 2020-02-03 DIAGNOSIS — H1010 Acute atopic conjunctivitis, unspecified eye: Secondary | ICD-10-CM

## 2020-02-03 DIAGNOSIS — J309 Allergic rhinitis, unspecified: Secondary | ICD-10-CM

## 2020-02-03 DIAGNOSIS — J3089 Other allergic rhinitis: Secondary | ICD-10-CM

## 2020-02-03 NOTE — Patient Instructions (Addendum)
1. Moderate persistent asthma, uncomplicated - Lung testing looks excellent today.  - We are not going to make any medication changes at this time.  - Daily controller medication(s): Singulair 10mg  daily and Advair 230/82mcg two puffs twice daily with spacer + Dupixent every two weeks  - Prior to physical activity: albuterol 2 puffs 10-15 minutes before physical activity. - Rescue medications: albuterol 4 puffs every 4-6 hours as needed - Changes during respiratory infections or worsening symptoms: Increase Advair 230/80mcg to 2 puffs twice daily for TWO WEEKS. - Asthma control goals:  * Full participation in all desired activities (may need albuterol before activity) * Albuterol use two time or less a week on average (not counting use with activity) * Cough interfering with sleep two time or less a month * Oral steroids no more than once a year * No hospitalizations  2. Chronic rhinitis (weeds, grasses, indoor molds, outdoor molds, dust mites, cat and dog) - Continue with: Xyzal (levocetirizine) 5mg  tablet once daily as needed, Singulair (montelukast) 10mg  daily and Flonase (fluticasone) two sprays per nostril daily  - Continue with allergy shots at the same schedule.   3. Reflux - Continue with Prilosec daily.  4. Return in about 4 months (around 06/04/2020). This can be an in-person, a virtual Webex or a telephone follow up visit.   Please inform of any Emergency Department visits, hospitalizations, or changes in symptoms. Call before going to the ED for breathing or allergy symptoms since we might be able to fit you in for a sick visit. Feel free to contact 06/06/2020 anytime with any questions, problems, or concerns.  It was a pleasure to see you again today!  Websites that have reliable patient information: 1. American Academy of Asthma, Allergy, and Immunology: www.aaaai.org 2. Food Allergy Research and Education (FARE): foodallergy.org 3. Mothers of Asthmatics:  http://www.asthmacommunitynetwork.org 4. American College of Allergy, Asthma, and Immunology: www.acaai.org   COVID-19 Vaccine Information can be found at: [place] For questions related to vaccine distribution or appointments, please email [email or web address] or call [phone].     "Like" [place] on Facebook and Instagram for our latest updates!       HAPPY SPRING!  Make sure you are registered to vote! If you have moved or changed any of your contact information, you will need to get this updated before voting!  In some cases, you MAY be able to register to vote online: [email or web address]

## 2020-02-03 NOTE — Progress Notes (Signed)
FOLLOW UP  Date of Service/Encounter:  02/05/20   Assessment:   Moderate persistent asthma- likely with VCD componentwith acute VCD  exacerbation   Gastroesophageal reflux disease- on PPI  Seasonal and perennial allergic rhinitis(weeds, grasses, indoor molds, outdoor molds, dust mites, cat and dog)  Chronic back pain - on disability from [place] and [place] at this time  Plan/Recommendations:   1. Moderate persistent asthma, uncomplicated - Lung testing looks excellent today.  - We are not going to make any medication changes at this time.  - Daily controller medication(s): Singulair 10mg  daily and Advair 230/66mcg two puffs twice daily with spacer + Dupixent every two weeks  - Prior to physical activity: albuterol 2 puffs 10-15 minutes before physical activity. - Rescue medications: albuterol 4 puffs every 4-6 hours as needed - Changes during respiratory infections or worsening symptoms: Increase Advair 230/7mcg to 2 puffs twice daily for TWO WEEKS. - Asthma control goals:  * Full participation in all desired activities (may need albuterol before activity) * Albuterol use two time or less a week on average (not counting use with activity) * Cough interfering with sleep two time or less a month * Oral steroids no more than once a year * No hospitalizations  2. Chronic rhinitis (weeds, grasses, indoor molds, outdoor molds, dust mites, cat and dog) - Continue with: Xyzal (levocetirizine) 5mg  tablet once daily as needed, Singulair (montelukast) 10mg  daily and Flonase (fluticasone) two sprays per nostril daily  - Continue with allergy shots at the same schedule.   3. Reflux - Continue with Prilosec daily.  4. Return in about 4 months (around 06/04/2020). This can be an in-person, a virtual Webex or a telephone follow up visit.   Subjective:   Jodi [name] is a 44 y.o. female presenting today for follow up of  Chief Complaint  Patient presents with  . Asthma     Doing okay.  . Allergies    Feels like pollen gets in her throat and makes her cough.     Jodi [name] has a history of the following: Patient Active Problem List   Diagnosis Date Noted  . Seasonal and perennial allergic rhinoconjunctivitis 02/05/2020  . Paradoxical vocal cord motion 02/05/2020  . Moderate persistent asthma, uncomplicated 09/24/2017  . Seasonal and perennial allergic rhinitis 09/24/2017  . SI joint arthritis 02/21/2016  . SI (sacroiliac) joint dysfunction 09/04/2015  . Fall from horse 07/30/2015  . Lumbar transverse process fracture (HCC) 07/30/2015  . Pelvic ring fracture (HCC) 07/28/2015  . Primary generalized (osteo)arthritis 03/13/2015  . Asthma 11/17/2014  . Gastroesophageal reflux disease 11/17/2014  . Vitamin D deficiency 11/04/2014  . Dyslipidemia 08/11/2012  . 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 Allergic rhinitis 02/19/2010    History obtained from: chart review and patient.  Jodi [name] is a 44 y.o. female presenting for a follow up visit. She was last seen as a sick visit in March 2021. At that time, not breathing very comfortably. Her lung testing actually worked quite and she improved to be more with Atrovent. We felt she was experiencing a vocal cord dysfunction episode. We did not start prednisone and referred her to slowly a voice specialist. We continued with Advair 230/21 mcg 2 puffs in the morning and 2 puffs at night as well as Dupixent and Singulair.  She did see Dr. [name] on April 1. She underwent a laryngoscopy that was normal. She diagnosed her with dysphonia based on the history which she felt was likely functional with  concern for paradoxical vocal fold motion and laryngeal spasms. She referred her to speech pathology. She is going to see speech and then follow-up thereafter. She is going to have a speech therapy session in [facility].   Since last visit, she has done well.   Asthma/Respiratory Symptom History: She remains on the  Advair two puffs twice daily. Her last Dupixent is due today. She gets this at home without any problems. She feels that the breathing exercises have been helpful and she is very open to learning from the speech therapist. She has not needed any prednisone or ED visits for her breathing since the last time that we saw her. ACT is 22 today, indicating excellent asthma control.   Allergic Rhinitis Symptom History: She has been getting her allergy shots once again. She remains on her nasal spray and her antihistamine. Althoughshe is only in the [facility], she does feel that her symptoms are better this year compared to last year. She has not needed any antibiotics at all since the last visit.   Otherwise, there have been no changes to her past medical history, surgical history, family history, or social history.    Review of Systems  Constitutional: Negative for chills, fever, malaise/fatigue and weight loss.  HENT: Negative.  Negative for congestion, ear discharge, ear pain and sinus pain.   Eyes: Negative for pain, discharge and redness.  Respiratory: Negative for cough, sputum production, shortness of breath and wheezing.   Cardiovascular: Negative.  Negative for chest pain and palpitations.  Gastrointestinal: Negative for abdominal pain, constipation, diarrhea, heartburn, nausea and vomiting.  Skin: Negative.  Negative for itching and rash.  Neurological: Negative for dizziness and headaches.  Endo/Heme/Allergies: Negative for environmental allergies. Does not bruise/bleed easily.       Objective:   Blood pressure 100/60, pulse 100, temperature 98.4 F (36.9 C), temperature source Temporal, resp. rate 18, last menstrual period 05/17/2012, SpO2 95 %. There is no height or weight on file to calculate BMI.   Physical Exam:  Physical Exam  Constitutional: She appears well-developed.  Breathing comfortably today.   HENT:  Head: Normocephalic and atraumatic.  Right Ear: Tympanic  membrane, external ear and ear canal normal.  Left Ear: Tympanic membrane, external ear and ear canal normal.  Nose: Mucosal edema and rhinorrhea present. No nasal deformity or septal deviation. No epistaxis. Right sinus exhibits no maxillary sinus tenderness and no frontal sinus tenderness. Left sinus exhibits no maxillary sinus tenderness and no frontal sinus tenderness.  Mouth/Throat: Uvula is midline and oropharynx is clear and moist. Mucous membranes are not pale and not dry.  Turbinates edematous. No bleeding at all.   Eyes: Pupils are equal, round, and reactive to light. Conjunctivae and EOM are normal. Right eye exhibits no chemosis and no discharge. Left eye exhibits no chemosis and no discharge. Right conjunctiva is not injected. Left conjunctiva is not injected.  Cardiovascular: Normal rate, regular rhythm and normal heart sounds.  Respiratory: Effort normal and breath sounds normal. No accessory muscle usage. No tachypnea. No respiratory distress. She has no wheezes. She has no rhonchi. She has no rales. She exhibits no tenderness.  No wheezing or crackles noted.   Lymphadenopathy:    She has no cervical adenopathy.  Neurological: She is alert.  Skin: No abrasion, no petechiae and no rash noted. Rash is not papular, not vesicular and not urticarial. No erythema. No pallor.  Psychiatric: She has a normal mood and affect.     Diagnostic studies:  Spirometry: results normal (FEV1: 2.53/96%, FVC: 2.93/90%, FEV1/FVC: 86%).    Spirometry consistent with normal pattern.   Allergy Studies: none       Salvatore Marvel, MD  [facility]

## 2020-02-05 DIAGNOSIS — H1010 Acute atopic conjunctivitis, unspecified eye: Secondary | ICD-10-CM | POA: Insufficient documentation

## 2020-02-05 DIAGNOSIS — J383 Other diseases of vocal cords: Secondary | ICD-10-CM | POA: Insufficient documentation

## 2020-02-10 ENCOUNTER — Ambulatory Visit (INDEPENDENT_AMBULATORY_CARE_PROVIDER_SITE_OTHER): Payer: 59

## 2020-02-10 DIAGNOSIS — J309 Allergic rhinitis, unspecified: Secondary | ICD-10-CM

## 2020-04-24 ENCOUNTER — Other Ambulatory Visit (HOSPITAL_COMMUNITY): Payer: Self-pay | Admitting: Unknown Physician Specialty

## 2020-04-24 DIAGNOSIS — Z1231 Encounter for screening mammogram for malignant neoplasm of breast: Secondary | ICD-10-CM

## 2020-04-30 ENCOUNTER — Ambulatory Visit (HOSPITAL_COMMUNITY): Payer: 59

## 2020-05-07 ENCOUNTER — Other Ambulatory Visit: Payer: Self-pay

## 2020-05-07 ENCOUNTER — Ambulatory Visit (HOSPITAL_COMMUNITY)
Admission: RE | Admit: 2020-05-07 | Discharge: 2020-05-07 | Disposition: A | Discharge status: Home / Self Care | Payer: 59 | Source: Ambulatory Visit | Attending: Unknown Physician Specialty | Admitting: Unknown Physician Specialty

## 2020-05-07 DIAGNOSIS — Z1231 Encounter for screening mammogram for malignant neoplasm of breast: Secondary | ICD-10-CM | POA: Diagnosis not present

## 2020-06-06 ENCOUNTER — Ambulatory Visit: Payer: 59 | Admitting: Allergy & Immunology

## 2020-07-12 ENCOUNTER — Other Ambulatory Visit: Payer: Self-pay | Admitting: Allergy & Immunology

## 2020-08-03 ENCOUNTER — Other Ambulatory Visit: Payer: Self-pay

## 2020-08-03 ENCOUNTER — Ambulatory Visit (INDEPENDENT_AMBULATORY_CARE_PROVIDER_SITE_OTHER): Payer: 59 | Admitting: Allergy & Immunology

## 2020-08-03 ENCOUNTER — Encounter: Payer: Self-pay | Admitting: Allergy & Immunology

## 2020-08-03 VITALS — BP 130/76 | HR 96 | Resp 17 | Ht 66.0 in | Wt 230.4 lb

## 2020-08-03 DIAGNOSIS — J383 Other diseases of vocal cords: Secondary | ICD-10-CM | POA: Diagnosis not present

## 2020-08-03 DIAGNOSIS — J4540 Moderate persistent asthma, uncomplicated: Secondary | ICD-10-CM | POA: Diagnosis not present

## 2020-08-03 DIAGNOSIS — Z7185 Encounter for immunization safety counseling: Secondary | ICD-10-CM | POA: Diagnosis not present

## 2020-08-03 DIAGNOSIS — J302 Other seasonal allergic rhinitis: Secondary | ICD-10-CM

## 2020-08-03 DIAGNOSIS — J3089 Other allergic rhinitis: Secondary | ICD-10-CM

## 2020-08-03 DIAGNOSIS — H1010 Acute atopic conjunctivitis, unspecified eye: Secondary | ICD-10-CM

## 2020-08-03 NOTE — Progress Notes (Signed)
FOLLOW UP  Date of Service/Encounter:  08/03/20   Assessment:   Moderate persistent asthma- likely with VCD componentwith acuteVCDexacerbation  Gastroesophageal reflux disease- on PPI  Seasonal and perennial allergic rhinitis(weeds, grasses, indoor molds, outdoor molds, dust mites, cat and dog)  Chronic back pain - on disability from [employer] at this time  Declines COVID-19 vaccine  Plan/Recommendations:   1. Moderate persistent asthma, uncomplicated - Lung testing deferred today since you are doing so well.  - We are not going to make any medication changes at this time.  - Daily controller medication(s): Singulair 10mg  daily and Advair 230/29mcg two puffs twice daily with spacer - Prior to physical activity: albuterol 2 puffs 10-15 minutes before physical activity. - Rescue medications: albuterol 4 puffs every 4-6 hours as needed - Changes during respiratory infections or worsening symptoms: Increase Advair 230/74mcg to 2 puffs twice daily for TWO WEEKS. - Asthma control goals:  * Full participation in all desired activities (may need albuterol before activity) * Albuterol use two time or less a week on average (not counting use with activity) * Cough interfering with sleep two time or less a month * Oral steroids no more than once a year * No hospitalizations  2. Chronic rhinitis (weeds, grasses, indoor molds, outdoor molds, dust mites, cat and dog) - Continue with: Xyzal (levocetirizine) 5mg  tablet once daily as needed, Singulair (montelukast) 10mg  daily and Flonase (fluticasone) two sprays per nostril daily  - Continue to hold allergy shots.   3. Reflux - Continue with Prilosec daily.  4. Return in about 6 months (around 02/01/2021).    Subjective:   Jodi [name] is a 44 y.o. female presenting today for follow up of  Chief Complaint  Patient presents with  . Allergic Rhinitis   . Asthma    Jodi [name] has a history of the  following: Patient Active Problem List   Diagnosis Date Noted  . Seasonal and perennial allergic rhinoconjunctivitis 02/05/2020  . Paradoxical vocal cord motion 02/05/2020  . Moderate persistent asthma, uncomplicated 09/24/2017  . Seasonal and perennial allergic rhinitis 09/24/2017  . SI joint arthritis 02/21/2016  . SI (sacroiliac) joint dysfunction 09/04/2015  . Fall from horse 07/30/2015  . Lumbar transverse process fracture (HCC) 07/30/2015  . Pelvic ring fracture (HCC) 07/28/2015  . Primary generalized (osteo)arthritis 03/13/2015  . Asthma 11/17/2014  . Gastroesophageal reflux disease 11/17/2014  . Vitamin D deficiency 11/04/2014  . Dyslipidemia 08/11/2012  . 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 Allergic rhinitis 02/19/2010    History obtained from: chart review and patient.  Jodi [name] is a 45 y.o. female presenting for a follow up visit. She was last seen in April 2021. At that time, lung testing looked great.  We did not make any medication changes.  We also continue with allergy shots at the same schedule.  In the interim, she has done well. She did have a URI a few weeks ago, but no asthma exacerbations.  She has stopped her allergy shots due to some transportation issues and back pain.  She reports that she had surgery in December and was told that she would have to "deal with the pain". She has been spending the summer trying to stay off of her back. She does not have full disability. She is in the process of determining the disability status. She has a lot of animals that seem to fill her life up. Her nephew comes and takes care of the animals, including a donkey,  horses, pigs, and goats. This keeps her busy.   Asthma/Respiratory Symptom History: She remains on the Advair two puffs twice daily. She does have some change of weather issues but otherwise she does fairly well. She is not using Dupixent, but she does not feel worse without it. Her URI a few weeks ago did not even bother  her at all. Jodi [name]'s asthma has been well controlled. She has not required rescue medication, experienced nocturnal awakenings due to lower respiratory symptoms, nor have activities of daily living been limited. She has required no Emergency Department or Urgent Care visits for her asthma. She has required zero courses of systemic steroids for asthma exacerbations since the last visit. ACT score today is 20, indicating excellent asthma symptom control.   Allergic Rhinitis Symptom History: Her allergies have been under good control. She did have worse symptoms when she was working at [employer]. She has been stale since being at home more often than note.  She is not going to restart her allergy shots at this point in time.  Being away from work has helped a lot with her symptoms.  She has not received the COVID-19 vaccine.  She does not have any particular concerns, but just wants to give it more time.  She stays isolated at her home.  Otherwise, there have been no changes to her past medical history, surgical history, family history, or social history.    Review of Systems  Constitutional: Negative.  Negative for chills, fever, malaise/fatigue and weight loss.  HENT: Positive for congestion. Negative for ear discharge, ear pain and sinus pain.   Eyes: Negative for pain, discharge and redness.  Respiratory: Positive for cough. Negative for sputum production, shortness of breath and wheezing.   Cardiovascular: Negative.  Negative for chest pain and palpitations.  Gastrointestinal: Negative for abdominal pain, constipation, diarrhea, heartburn, nausea and vomiting.  Skin: Negative.  Negative for itching and rash.  Neurological: Negative for dizziness and headaches.  Endo/Heme/Allergies: Positive for environmental allergies. Does not bruise/bleed easily.       Objective:   Blood pressure 130/76, pulse 96, resp. rate 17, height 5\' 6"  (1.676 m), weight 230 lb 6.4 oz (104.5 kg), last menstrual period  05/17/2012, SpO2 100 %. Body mass index is 37.19 kg/m.   Physical Exam:  Physical Exam Constitutional:      Appearance: She is well-developed.     Comments: Smiling.  HENT:     Head: Normocephalic and atraumatic.     Right Ear: Tympanic membrane, ear canal and external ear normal.     Left Ear: Tympanic membrane, ear canal and external ear normal.     Nose: No nasal deformity, septal deviation, mucosal edema or rhinorrhea.     Right Turbinates: Enlarged and swollen.     Left Turbinates: Enlarged and swollen.     Right Sinus: No maxillary sinus tenderness or frontal sinus tenderness.     Left Sinus: No maxillary sinus tenderness or frontal sinus tenderness.     Comments: Minimal sinus tenderness.    Mouth/Throat:     Mouth: Mucous membranes are not pale and not dry.     Pharynx: Uvula midline.  Eyes:     General:        Right eye: No discharge.        Left eye: No discharge.     Conjunctiva/sclera: Conjunctivae normal.     Right eye: Right conjunctiva is not injected. No chemosis.    Left eye: Left conjunctiva is not  injected. No chemosis.    Pupils: Pupils are equal, round, and reactive to light.  Cardiovascular:     Rate and Rhythm: Normal rate and regular rhythm.     Heart sounds: Normal heart sounds.  Pulmonary:     Effort: Pulmonary effort is normal. No tachypnea, accessory muscle usage or respiratory distress.     Breath sounds: Normal breath sounds. No wheezing, rhonchi or rales.     Comments: Moving air well in all lung fields.  No increased work of breathing. Chest:     Chest wall: No tenderness.  Lymphadenopathy:     Cervical: No cervical adenopathy.  Skin:    Coloration: Skin is not pale.     Findings: No abrasion, erythema, petechiae or rash. Rash is not papular, urticarial or vesicular.     Comments: No eczematous or urticarial lesions noted.  Neurological:     Mental Status: She is alert.  Psychiatric:        Behavior: Behavior is cooperative.       Diagnostic studies: none      Malachi Bonds, MD  [facility]

## 2020-08-03 NOTE — Patient Instructions (Addendum)
1. Moderate persistent asthma, uncomplicated - Lung testing deferred today since you are doing so well.  - We are not going to make any medication changes at this time.  - Daily controller medication(s): Singulair 10mg  daily and Advair 230/64mcg two puffs twice daily with spacer - Prior to physical activity: albuterol 2 puffs 10-15 minutes before physical activity. - Rescue medications: albuterol 4 puffs every 4-6 hours as needed - Changes during respiratory infections or worsening symptoms: Increase Advair 230/33mcg to 2 puffs twice daily for TWO WEEKS. - Asthma control goals:  * Full participation in all desired activities (may need albuterol before activity) * Albuterol use two time or less a week on average (not counting use with activity) * Cough interfering with sleep two time or less a month * Oral steroids no more than once a year * No hospitalizations  2. Chronic rhinitis (weeds, grasses, indoor molds, outdoor molds, dust mites, cat and dog) - Continue with: Xyzal (levocetirizine) 5mg  tablet once daily as needed, Singulair (montelukast) 10mg  daily and Flonase (fluticasone) two sprays per nostril daily  - Continue to hold allergy shots.   3. Reflux - Continue with Prilosec daily.  4. Return in about 6 months (around 02/01/2021).   Please inform of any Emergency Department visits, hospitalizations, or changes in symptoms. Call before going to the ED for breathing or allergy symptoms since we might be able to fit you in for a sick visit. Feel free to contact 02/03/2021 anytime with any questions, problems, or concerns.  It was a pleasure to see you again today!  Websites that have reliable patient information: 1. American Academy of Asthma, Allergy, and Immunology: www.aaaai.org 2. Food Allergy Research and Education (FARE): foodallergy.org 3. Mothers of Asthmatics: http://www.asthmacommunitynetwork.org 4. American College of Allergy, Asthma, and Immunology:  www.acaai.org   COVID-19 Vaccine Information can be found at: [place] For questions related to vaccine distribution or appointments, please email [email or web address] or call [phone].     "Like" [place] on Facebook and Instagram for our latest updates!     HAPPY FALL!     Make sure you are registered to vote! If you have moved or changed any of your contact information, you will need to get this updated before voting!  In some cases, you MAY be able to register to vote online: [email or web address]

## 2020-08-06 ENCOUNTER — Other Ambulatory Visit: Payer: Self-pay | Admitting: *Deleted

## 2020-08-06 ENCOUNTER — Encounter: Payer: Self-pay | Admitting: Allergy & Immunology

## 2020-08-12 ENCOUNTER — Other Ambulatory Visit: Payer: Self-pay | Admitting: Allergy & Immunology

## 2021-02-01 ENCOUNTER — Ambulatory Visit: Payer: 59 | Admitting: Allergy & Immunology

## 2021-02-01 ENCOUNTER — Ambulatory Visit: Payer: 59 | Admitting: Family Medicine

## 2021-02-18 ENCOUNTER — Other Ambulatory Visit: Payer: Self-pay | Admitting: Allergy & Immunology

## 2021-03-01 ENCOUNTER — Encounter: Payer: Self-pay | Admitting: Family Medicine

## 2021-03-01 ENCOUNTER — Other Ambulatory Visit: Payer: Self-pay

## 2021-03-01 ENCOUNTER — Ambulatory Visit: Payer: 59 | Admitting: Family Medicine

## 2021-03-01 VITALS — BP 116/86 | HR 93 | Temp 98.2°F | Resp 16 | Ht 66.0 in | Wt 222.0 lb

## 2021-03-01 DIAGNOSIS — J302 Other seasonal allergic rhinitis: Secondary | ICD-10-CM

## 2021-03-01 DIAGNOSIS — K219 Gastro-esophageal reflux disease without esophagitis: Secondary | ICD-10-CM | POA: Diagnosis not present

## 2021-03-01 DIAGNOSIS — J4540 Moderate persistent asthma, uncomplicated: Secondary | ICD-10-CM

## 2021-03-01 DIAGNOSIS — J3089 Other allergic rhinitis: Secondary | ICD-10-CM | POA: Diagnosis not present

## 2021-03-01 NOTE — Patient Instructions (Signed)
Asthma Continue montelukast 10 mg once a day to prevent cough or wheeze Continue Advair 230-2 puffs twice a day with a spacer to prevent cough or wheeze Continue Xopenex 2 puffs once every 4-6 hours as needed for cough or wheeze For asthma flare, increase Advair 230 to 4 puffs twice a day with a spacer for 2 weeks or until cough and wheeze free and then return to previous dosing  Allergic rhinitis Continue allergen avoidance measures directed toward weed pollen, grass pollen, mold, dust mite, cat, and dog as listed below Continue Xyzal 5 mg once a day as needed for runny nose or itch Continue montelukast 10 mg once a day as listed above Continue Flonase 2 sprays in each nostril once a day as needed for stuffy nose Consider saline nasal rinses as needed for nasal symptoms. Use this before any medicated nasal sprays for best result  Reflux Continue dietary and lifestyle modifications as listed below Continue Prilosec 20 mg once a day as previously prescribed  Call the clinic if this treatment plan is not working well for you  Follow up in 6 months or sooner if needed.   Reducing Pollen Exposure The American Academy of Allergy, Asthma and Immunology suggests the following steps to reduce your exposure to pollen during allergy seasons. 1. Do not hang sheets or clothing out to dry; pollen may collect on these items. 2. Do not mow lawns or spend time around freshly cut grass; mowing stirs up pollen. 3. Keep windows closed at night.  Keep car windows closed while driving. 4. Minimize morning activities outdoors, a time when pollen counts are usually at their highest. 5. Stay indoors as much as possible when pollen counts or humidity is high and on windy days when pollen tends to remain in the air longer. 6. Use air conditioning when possible.  Many air conditioners have filters that trap the pollen spores. 7. Use a HEPA room air filter to remove pollen form the indoor air you  breathe.  Control of Mold Allergen Mold and fungi can grow on a variety of surfaces provided certain temperature and moisture conditions exist.  Outdoor molds grow on plants, decaying vegetation and soil.  The major outdoor mold, Alternaria and Cladosporium, are found in very high numbers during hot and dry conditions.  Generally, a late Summer - Fall peak is seen for common outdoor fungal spores.  Rain will temporarily lower outdoor mold spore count, but counts rise rapidly when the rainy period ends.  The most important indoor molds are Aspergillus and Penicillium.  Dark, humid and poorly ventilated basements are ideal sites for mold growth.  The next most common sites of mold growth are the bathroom and the kitchen.  Outdoor [employer] 8. Use air conditioning and keep windows closed 9. Avoid exposure to decaying vegetation. 10. Avoid leaf raking. 11. Avoid grain handling. 12. Consider wearing a face mask if working in moldy areas.  Indoor Mold Control 1. Maintain humidity below 50%. 2. Clean washable surfaces with 5% bleach solution. 3. Remove sources e.g. Contaminated carpets.   Control of Dust Mite Allergen Dust mites play a major role in allergic asthma and rhinitis. They occur in environments with high humidity wherever human skin is found. Dust mites absorb humidity from the atmosphere (ie, they do not drink) and feed on organic matter (including shed human and animal skin). Dust mites are a microscopic type of insect that you cannot see with the naked eye. High levels of dust mites have  been detected from mattresses, pillows, carpets, upholstered furniture, bed covers, clothes, soft toys and any woven material. The principal allergen of the dust mite is found in its feces. A gram of dust may contain 1,000 mites and 250,000 fecal particles. Mite antigen is easily measured in the air during house cleaning activities. Dust mites do not bite and do not cause harm to humans, other than by  triggering allergies/asthma.  Ways to decrease your exposure to dust mites in your home:  1. Encase mattresses, box springs and pillows with a mite-impermeable barrier or cover  2. Wash sheets, blankets and drapes weekly in hot water (130 F) with detergent and dry them in a dryer on the hot setting.  3. Have the room cleaned frequently with a vacuum cleaner and a damp dust-mop. For carpeting or rugs, vacuuming with a vacuum cleaner equipped with a high-efficiency particulate air (HEPA) filter. The dust mite allergic individual should not be in a room which is being cleaned and should wait 1 hour after cleaning before going into the room.  4. Do not sleep on upholstered furniture (eg, couches).  5. If possible removing carpeting, upholstered furniture and drapery from the home is ideal. Horizontal blinds should be eliminated in the rooms where the person spends the most time (bedroom, study, television room). Washable vinyl, roller-type shades are optimal.  6. Remove all non-washable stuffed toys from the bedroom. Wash stuffed toys weekly like sheets and blankets above.  7. Reduce indoor humidity to less than 50%. Inexpensive humidity monitors can be purchased at most hardware stores. Do not use a humidifier as can make the problem worse and are not recommended.  Control of Dog or Cat Allergen Avoidance is the best way to manage a dog or cat allergy. If you have a dog or cat but don't want to find it a new home, or if your family wants a pet even though someone in the household is allergic, here are some strategies that may help keep symptoms at bay:  13. Keep the pet out of your bedroom and restrict it to only a few rooms. Be advised that keeping the dog or cat in only one room will not limit the allergens to that room. 14. Don't pet, hug or kiss the dog or cat; if you do, wash your hands with soap and water. 15. High-efficiency particulate air (HEPA) cleaners run continuously in a bedroom or  living room can reduce allergen levels over time. 16. Regular use of a high-efficiency vacuum cleaner or a central vacuum can reduce allergen levels. 17. Giving your dog or cat a bath at least once a week can reduce airborne allergen.

## 2021-03-01 NOTE — Progress Notes (Signed)
[address] [place] [address] Dept: [phone]  FOLLOW UP NOTE  Patient ID: Jodi [name], female    DOB: [birth year]  Age: 45 y.o. MRN: [id number] Date of Office Visit: 03/01/2021  Assessment  Chief Complaint: Asthma  HPI Jodi [name] is a 45 year old female who presents to the clinic for a follow up visit. She was last seen in this clinic on 08/03/2020 by Dr. Dellis Anes for evaluation of asthma, allergic rhinitis, reflux, and possible vocal cord dysfunction. At today's visit, she reports her asthma has been well controlled with no shortness of breath, cough, or wheeze with activity or rest. She continues montelukast 10 mg once a day and uses her Advair 230 and Xopenex as needed. She reports that she usually starts Advair with the weather change a couple times a year. She reports that albuterol makes her jittery. Allergic rhinitis is reported as moderately well controlled with seasonal variation with spring and summer being the worst seasons with symptoms including occasional nasal congestion and sneezing. She continues Xyzal 5 mg once a day and occasionally uses Flonase. She reports poor application technique with Flonase. She is not currently using nasal saline rinses. Reflux is reported as well controled with omeprazole 20 mg once a day. Her current medications are listed in the chart.    Drug Allergies:  Allergies  Allergen Reactions  . Prochlorperazine Anaphylaxis and Other (See Comments)    (Compazine) Can not control facial muscles, face swells, tongue sticks out, lost oral control Can not control facial muscles Other reaction(s): Other (See Comments) Lost oral control Face swells, tongue sticks out  Lost oral control Face swells, tongue sticks out Lost oral control Can not control facial muscles Lost oral control Face swells, tongue sticks out Can not control facial muscles, face swells, tongue sticks out, lost oral control Can not control  facial muscles Other reaction(s): Other (See Comments) Lost oral control Face swells, tongue sticks out (Compazine) Can not control facial muscles, face swells, tongue sticks out, lost oral control Can not control facial muscles Other reaction(s): Other (See Comments) Lost oral control Face swells, tongue sticks out    Physical Exam: BP 116/86 (BP Location: Left Arm, Patient Position: Sitting, Cuff Size: Normal)   Pulse 93   Temp 98.2 F (36.8 C) (Temporal)   Resp 16   Ht 5\' 6"  (1.676 m)   Wt 222 lb (100.7 kg)   LMP 05/17/2012   SpO2 96%   BMI 35.83 kg/m    Physical Exam Vitals reviewed.  Constitutional:      Appearance: Normal appearance.  HENT:     Head: Normocephalic and atraumatic.     Right Ear: Tympanic membrane normal.     Left Ear: Tympanic membrane normal.     Nose:     Comments: Bilateral nares slightly erythematous with clear nasal drainage noted. Pharynx normal. Ears normal. Eyes normal.    Mouth/Throat:     Pharynx: Oropharynx is clear.  Eyes:     Conjunctiva/sclera: Conjunctivae normal.  Cardiovascular:     Rate and Rhythm: Normal rate and regular rhythm.     Heart sounds: Normal heart sounds. No murmur heard.   Pulmonary:     Effort: Pulmonary effort is normal.     Breath sounds: Normal breath sounds.     Comments: Lungs clear to auscultation Musculoskeletal:        General: Normal range of motion.     Cervical back: Normal range of motion and  neck supple.  Skin:    General: Skin is warm and dry.  Neurological:     Mental Status: She is alert and oriented to person, place, and time.  Psychiatric:        Mood and Affect: Mood normal.        Behavior: Behavior normal.        Thought Content: Thought content normal.        Judgment: Judgment normal.     Diagnostics: FVC 3.15, FEV1 2.52. Predicted FVC 3.14, predicted FEV1 2.54. Spirometry indicates normal ventilatory function.   Assessment and Plan: 1. Moderate persistent asthma,  uncomplicated   2. Seasonal and perennial allergic rhinitis   3. Gastroesophageal reflux disease, unspecified whether esophagitis present     Patient Instructions  Asthma Continue montelukast 10 mg once a day to prevent cough or wheeze Continue Advair 230-2 puffs twice a day with a spacer to prevent cough or wheeze Continue Xopenex 2 puffs once every 4-6 hours as needed for cough or wheeze For asthma flare, increase Advair 230 to 4 puffs twice a day with a spacer for 2 weeks or until cough and wheeze free and then return to previous dosing  Allergic rhinitis Continue allergen avoidance measures directed toward weed pollen, grass pollen, mold, dust mite, cat, and dog as listed below Continue Xyzal 5 mg once a day as needed for runny nose or itch Continue montelukast 10 mg once a day as listed above Continue Flonase 2 sprays in each nostril once a day as needed for stuffy nose Consider saline nasal rinses as needed for nasal symptoms. Use this before any medicated nasal sprays for best result  Reflux Continue dietary and lifestyle modifications as listed below Continue Prilosec 20 mg once a day as previously prescribed  Call the clinic if this treatment plan is not working well for you  Follow up in 6 months or sooner if needed.    Return in about 6 months (around 09/01/2021), or if symptoms worsen or fail to improve.    Thank you for the opportunity to care for this patient.  Please do not hesitate to contact me with questions.  Thermon Leyland, FNP [facility]

## 2021-05-03 ENCOUNTER — Other Ambulatory Visit: Payer: Self-pay

## 2021-05-03 ENCOUNTER — Emergency Department (HOSPITAL_COMMUNITY): Payer: 59

## 2021-05-03 ENCOUNTER — Encounter (HOSPITAL_COMMUNITY): Payer: Self-pay | Admitting: *Deleted

## 2021-05-03 ENCOUNTER — Emergency Department (HOSPITAL_COMMUNITY)
Admission: EM | Admit: 2021-05-03 | Discharge: 2021-05-03 | Disposition: A | Discharge status: Home / Self Care | Payer: 59 | Attending: Emergency Medicine | Admitting: Emergency Medicine

## 2021-05-03 DIAGNOSIS — J45909 Unspecified asthma, uncomplicated: Secondary | ICD-10-CM | POA: Diagnosis not present

## 2021-05-03 DIAGNOSIS — Z87891 Personal history of nicotine dependence: Secondary | ICD-10-CM | POA: Diagnosis not present

## 2021-05-03 DIAGNOSIS — M545 Low back pain, unspecified: Secondary | ICD-10-CM | POA: Diagnosis present

## 2021-05-03 DIAGNOSIS — Z7951 Long term (current) use of inhaled steroids: Secondary | ICD-10-CM | POA: Insufficient documentation

## 2021-05-03 DIAGNOSIS — M5416 Radiculopathy, lumbar region: Secondary | ICD-10-CM | POA: Insufficient documentation

## 2021-05-03 MED ORDER — DEXAMETHASONE SODIUM PHOSPHATE 10 MG/ML IJ SOLN
10.0000 mg | Freq: Once | INTRAMUSCULAR | Status: AC
  Administered 2021-05-03: 10 mg via INTRAVENOUS
  Filled 2021-05-03: qty 1

## 2021-05-03 MED ORDER — PREDNISONE 10 MG PO TABS: ORAL_TABLET | ORAL | 0 refills | Status: DC

## 2021-05-03 MED ORDER — KETOROLAC TROMETHAMINE 30 MG/ML IJ SOLN
15.0000 mg | Freq: Once | INTRAMUSCULAR | Status: AC
  Administered 2021-05-03: 15 mg via INTRAVENOUS
  Filled 2021-05-03: qty 1

## 2021-05-03 MED ORDER — PREDNISONE 10 MG PO TABS
ORAL_TABLET | ORAL | 0 refills | Status: DC
Start: 2021-05-03 — End: 2021-05-03

## 2021-05-03 MED ORDER — ONDANSETRON HCL 4 MG/2ML IJ SOLN
4.0000 mg | Freq: Once | INTRAMUSCULAR | Status: AC
  Administered 2021-05-03: 4 mg via INTRAVENOUS
  Filled 2021-05-03: qty 2

## 2021-05-03 MED ORDER — OXYCODONE-ACETAMINOPHEN 5-325 MG PO TABS
1.0000 | ORAL_TABLET | Freq: Once | ORAL | Status: AC
  Administered 2021-05-03: 1 via ORAL
  Filled 2021-05-03: qty 1

## 2021-05-03 MED ORDER — GADOBUTROL 1 MMOL/ML IV SOLN
10.0000 mL | Freq: Once | INTRAVENOUS | Status: AC | PRN
  Administered 2021-05-03: 10 mL via INTRAVENOUS

## 2021-05-03 MED ORDER — OXYCODONE-ACETAMINOPHEN 5-325 MG PO TABS: 1.0000 | ORAL_TABLET | ORAL | 0 refills | Status: DC | PRN

## 2021-05-03 MED ORDER — HYDROMORPHONE HCL 1 MG/ML IJ SOLN
0.5000 mg | Freq: Once | INTRAMUSCULAR | Status: AC
  Administered 2021-05-03: 0.5 mg via INTRAVENOUS
  Filled 2021-05-03: qty 1

## 2021-05-03 NOTE — ED Provider Notes (Signed)
[facility] EMERGENCY DEPARTMENT Provider Note   CSN: [id number] Arrival date & time: 05/03/21  4098     History Chief Complaint  Patient presents with   Leg Pain    Jodi [name] is a 45 y.o. female with a history of chronic low back pain after multiple surgeries of her pelvis due to a complicated pelvic fracture after a fall from horse in 2016.  She has daily pain which she tolerates and is mostly localizing to the left pelvis hip and leg, but has also had some chronic pain in her right buttock region which has been mild and present since February but woke today with significant pain on the right with pain and numbness that radiates all the way down to her foot.  She endorses a weak sensation in the leg, describing numbness radiating down her lateral leg.  She has been able to weight bear but has had to be very careful by holding onto objects to help prevent falling.  She denies urinary or fecal incontinence or retention.  She is under the care of an orthopedic surgeon at [facility].  She has been told that she has significant degenerative disc changes in her lumbar spine and eventually may require additional surgeries.  The history is provided by the patient.      Past Medical History:  Diagnosis Date   Abdominal pain, chronic, right upper quadrant    Acute recurrent maxillary sinusitis 11/04/2014   Allergic rhinitis    Anemia    Angio-edema    Arthritis    knee   Asthma    Cough variant   Asthma    Asthma    Back pain    Chronic pain syndrome 06/08/2013   DVT (deep venous thrombosis) (HCC) 10 yrs ago   Dyslipidemia 08/11/2012   Eczema    Fractures    GERD (gastroesophageal reflux disease)    Heart murmur    as a child   Hyperlipidemia    IBS (irritable bowel syndrome)    Lung granuloma (HCC) 07/22/2011   4mm per CT chest   Pneumonia    Shortness of breath    SOB even without exertion at times   Sickle-cell trait [facility])     Patient Active Problem  List   Diagnosis Date Noted   Seasonal and perennial allergic rhinoconjunctivitis 02/05/2020   Paradoxical vocal cord motion 02/05/2020   Moderate persistent asthma, uncomplicated 09/24/2017   Seasonal and perennial allergic rhinitis 09/24/2017   SI joint arthritis 02/21/2016   SI (sacroiliac) joint dysfunction 09/04/2015   Fall from horse 07/30/2015   Lumbar transverse process fracture (HCC) 07/30/2015   Pelvic ring fracture (HCC) 07/28/2015   Primary generalized (osteo)arthritis 03/13/2015   Asthma 11/17/2014   Gastroesophageal reflux disease 11/17/2014   Vitamin D deficiency 11/04/2014   Dyslipidemia 08/11/2012   Lung granuloma (HCC) 07/22/2011   Allergic rhinitis 02/19/2010    Past Surgical History:  Procedure Laterality Date   ABDOMINAL HYSTERECTOMY  4-5 yrs ago   ABDOMINAL HYSTERECTOMY     blood clot removed from lower abdomen  2000   CHOLECYSTECTOMY  11/12/2012   Procedure: LAPAROSCOPIC CHOLECYSTECTOMY;  Surgeon: Dalia Heading, MD;  Location: [facility] ORS;  Service: General;  Laterality: N/A;   CHOLECYSTECTOMY     COLONOSCOPY  11/04/2012   Procedure: COLONOSCOPY;  Surgeon: Malissa Hippo, MD;  Location: [facility] ENDO SUITE;  Service: Endoscopy;  Laterality: N/A;  915   cyst removed from ovary and fluid removed from  tubes  2000   ESOPHAGOGASTRODUODENOSCOPY  10/22/2012   Procedure: ESOPHAGOGASTRODUODENOSCOPY (EGD);  Surgeon: Malissa Hippo, MD;  Location: [facility] END[facility] SUITE;  Service: Endoscopy;  Laterality: N/A;  100   FRACTURE SURGERY     HARDWARE REMOVAL Left 02/21/2016   Procedure: HARDWARE REMOVAL LEFT SACROILIAC JOINT;  Surgeon: Myrene Galas, MD;  Location: [facility] OR;  Service: Orthopedics;  Laterality: Left;   ORIF PELVIC FRACTURE N/A 07/30/2015   Procedure: OPEN REDUCTION INTERNAL FIXATION (ORIF) PELVIC FRACTURE;  Surgeon: Myrene Galas, MD;  Location: [facility] OR;  Service: Orthopedics;  Laterality: N/A;   PARTIAL HYSTERECTOMY  2009   SACRO-ILIAC PINNING Right 07/30/2015   Procedure:  Loyal Gambler;  Surgeon: Myrene Galas, MD;  Location: [facility] OR;  Service: Orthopedics;  Laterality: Right;   SACRO-ILIAC PINNING Left 09/04/2015   Procedure: LEFT TO RIGHT TRANS SACRO-ILIAC SCREW;  Surgeon: Myrene Galas, MD;  Location: [facility] OR;  Service: Orthopedics;  Laterality: Left;   SACROILIAC JOINT FUSION Left 02/21/2016   Procedure: LEFT SACROILIAC JOINT FUSION;  Surgeon: Myrene Galas, MD;  Location: [facility] OR;  Service: Orthopedics;  Laterality: Left;   TUBAL LIGATION  1999     OB History     Gravida  0   Para  0   Term  0   Preterm  0   AB  0   Living         SAB  0   IAB  0   Ectopic  0   Multiple      Live Births              Family History  Problem Relation Age of Onset   Asthma Mother    Hypertension Mother    Bronchiolitis Mother    Hypertension Father    Hyperlipidemia Father        recurrent rectum polyps    Allergic rhinitis Father    Diabetes Sister    Lupus Sister    Depression Sister    Angioedema Neg Hx    Atopy Neg Hx    Eczema Neg Hx    Immunodeficiency Neg Hx    Urticaria Neg Hx     Social History   Tobacco Use   Smoking status: Former    Packs/day: 0.25    Years: 10.00    Pack years: 2.50    Types: Cigarettes    Quit date: 10/13/2008    Years since quitting: 12.5   Smokeless tobacco: Never  Vaping Use   Vaping Use: Never used  Substance Use Topics   Alcohol use: No   Drug use: No    Home Medications Prior to Admission medications   Medication Sig Start Date End Date Taking? Authorizing Provider  albuterol (VENTOLIN HFA) 108 (90 Base) MCG/ACT inhaler SMARTSIG:2 Puff(s) By Mouth Every 4 Hours 02/18/21  Yes [provider]  celecoxib (CELEBREX) 100 MG capsule Take 100 mg by mouth daily. 06/30/20  Yes [provider]  desipramine (NORPRAMIN) 50 MG tablet Take 50 mg by mouth at bedtime.   Yes [provider]  fluticasone Aleda Grana) 50 MCG/ACT nasal spray Use 2 spray(s) in each nostril once daily  07/20/19  Yes Alfonse Spruce, MD  fluticasone-salmeterol (ADVAIR [facility]) [phone] MCG/ACT inhaler Inhale 2 puffs into the lungs 2 (two) times daily. 01/04/20  Yes Alfonse Spruce, MD  hydrochlorothiazide (HYDRODIURIL) 25 MG tablet Take 25 mg by mouth daily. 07/06/20  Yes [provider]  ipratropium (ATROVENT) 0.02 % nebulizer solution Take 2.5  mLs (0.5 mg total) by nebulization every 4 (four) hours as needed for wheezing or shortness of breath. 01/04/20  Yes Alfonse Spruce, MD  ipratropium-albuterol (DUONEB) 0.5-2.5 (3) MG/3ML SOLN Take 3 mLs by nebulization every 6 (six) hours as needed (shortness of breath).    Yes [provider]  levocetirizine (XYZAL) 5 MG tablet TAKE 1 TABLET BY MOUTH ONCE DAILY AS NEEDED FOR ALLERGIES 02/18/21  Yes Ambs, Norvel Richards, FNP  montelukast (SINGULAIR) 10 MG tablet TAKE ONE TABLET BY MOUTH IN THE MORNING 04/28/16  Yes Kerri Perches, MD  omeprazole (PRILOSEC) 20 MG capsule Take 1 capsule (20 mg total) by mouth daily. 01/05/14  Yes Setzer, Terri L, NP  oxyCODONE-acetaminophen (PERCOCET/ROXICET) 5-325 MG tablet Take 1 tablet by mouth every 4 (four) hours as needed. 05/03/21  Yes Seidy Labreck, Raynelle Fanning, PA-C  tiZANidine (ZANAFLEX) 4 MG tablet Take 4 mg by mouth 3 (three) times daily. 08/02/20  Yes [provider]  amoxicillin-clavulanate (AUGMENTIN) 875-125 MG tablet Take 1 tablet by mouth 2 (two) times daily. Patient not taking: No sig reported 07/25/20   [provider]  levalbuterol (XOPENEX HFA) 45 MCG/ACT inhaler Inhale 2 puffs into the lungs every 4 (four) hours as needed for wheezing. 12/21/19 01/20/20  Alfonse Spruce, MD  metroNIDAZOLE (FLAGYL) 250 MG tablet Take 250 mg by mouth 2 (two) times daily. Patient not taking: No sig reported 05/29/20   [provider]  pravastatin (PRAVACHOL) 10 MG tablet Take 10 mg by mouth at bedtime. Patient not taking: No sig reported 05/28/20   [provider]  predniSONE  (DELTASONE) 10 MG tablet 6, 5, 4, 3, 2 then 1 tablet by mouth daily for 6 days total. 05/03/21   Tasheem Elms, Raynelle Fanning, PA-C    Allergies    Prochlorperazine  Review of Systems   Review of Systems  Constitutional:  Negative for chills and fever.  Respiratory:  Negative for shortness of breath.   Cardiovascular:  Negative for chest pain and leg swelling.  Gastrointestinal:  Negative for abdominal distention, abdominal pain and constipation.  Genitourinary:  Negative for difficulty urinating, dysuria, flank pain, frequency and urgency.  Musculoskeletal:  Positive for back pain. Negative for gait problem and joint swelling.  Skin:  Negative for rash.  Neurological:  Positive for weakness and numbness.  All other systems reviewed and are negative.  Physical Exam Updated Vital Signs BP (!) 144/94 (BP Location: Left Arm)   Pulse 88   Temp 97.9 F (36.6 C)   Resp 16   Ht 5\' 6"  (1.676 m)   Wt 99.3 kg   LMP 05/17/2012   SpO2 98%   BMI 35.35 kg/m   Physical Exam Vitals and nursing note reviewed.  Constitutional:      Appearance: She is well-developed.  HENT:     Head: Normocephalic.  Eyes:     Conjunctiva/sclera: Conjunctivae normal.  Cardiovascular:     Rate and Rhythm: Normal rate.     Comments: Pedal pulses normal. Pulmonary:     Effort: Pulmonary effort is normal.  Abdominal:     General: Bowel sounds are normal. There is no distension.     Palpations: Abdomen is soft. There is no mass.     Tenderness: There is no abdominal tenderness.  Musculoskeletal:     Cervical back: Normal, normal range of motion and neck supple.     Thoracic back: Normal.     Lumbar back: Tenderness present. No swelling, edema or spasms. Positive right straight leg raise  test.     Right lower leg: No edema.     Left lower leg: No edema.     Comments: Pain with attempted active and passive right SLR.  Pedal pulses intact.  Decreased fine and sharp touch right lateral lower leg.  Medial leg normal  sensation.  Skin:    General: Skin is warm and dry.  Neurological:     Mental Status: She is alert.     Sensory: No sensory deficit.     Motor: No tremor or atrophy.     Gait: Gait normal.     Deep Tendon Reflexes: Babinski sign absent on the right side. Babinski sign absent on the left side.     Reflex Scores:      Patellar reflexes are 1+ on the right side and 1+ on the left side.      Achilles reflexes are 0 on the right side and 2+ on the left side.    Comments: No strength deficit noted in hip and knee flexor and extensor muscle groups.  Ankle flexion and extension intact. Unable to elicit right achilles dtr.  No clonus or foot drop, but weak right ankle flexion.     ED Results / Procedures / Treatments   Labs (all labs ordered are listed, but only abnormal results are displayed) Labs Reviewed - No data to display  EKG None  Radiology MR Lumbar Spine W Wo Contrast  Result Date: 05/03/2021 CLINICAL DATA:  Low back pain, progressive neurologic deficit. Additional history provided by scanning technologist: Patient reports right buttock pain shooting down right leg, worsening since last night. EXAM: MRI LUMBAR SPINE WITHOUT AND WITH CONTRAST TECHNIQUE: Multiplanar and multiecho pulse sequences of the lumbar spine were obtained without and with intravenous contrast. CONTRAST:  69mL GADAVIST GADOBUTROL 1 MMOL/ML IV SOLN COMPARISON:  Lumbar spine MRI 04/13/2018. Lumbar spine radiographs 04/01/2018. CT abdomen/pelvis 04/27/2017. FINDINGS: Segmentation: Spinal numbering will remain consistent with that utilized on the prior lumbar spine MRI of 04/13/2018. By this numbering convention, ribs are hypoplastic or absent at the T12 level and there are full size ribs at T11. Alignment:  Trace L1-L2 and L2-L3 grade 1 retrolisthesis. Vertebrae: Susceptibility artifact arising from sacroiliac hardware, partially obscuring the L5-S1 level and sacral spine. Within this limitation, there is no appreciable  significant marrow edema or focal suspicious osseous lesion. Small L5 vertebral body hemangioma. Conus medullaris and cauda equina: Conus extends to the L1-L2 level. No signal abnormality within the visualized distal spinal cord. No abnormal enhancement of the visualized distal spinal cord or cauda equina nerve roots. Paraspinal and other soft tissues: No abnormality identified within included portions of the abdomen/retroperitoneum. Paraspinal soft tissues unremarkable. Disc levels: Unless otherwise stated, the level by level findings below have not significantly changed from the prior MRI of 04/13/2018. Mild multilevel disc degeneration, greatest at L2-L3 and L3-L4. T11-T12: Imaged sagittally. No significant disc herniation or stenosis. T12-L1: No significant disc herniation or stenosis. L1-L2: Trace grade 1 retrolisthesis. No significant disc herniation or stenosis. L2-L3: Trace grade 1 retrolisthesis. Small bilateral foraminal disc protrusions have slightly progressed. Mild facet arthrosis. No significant spinal canal stenosis. Mild right inferior neural foraminal narrowing, new from the prior exam L3-L4: Disc bulge. Superimposed broad-based shallow left subarticular to left foraminal disc protrusion. Mild facet arthrosis/ligamentum flavum hypertrophy. Minimal relative left subarticular narrowing without appreciable nerve root impingement. Central canal patent. Mild left inferior neural foraminal narrowing, slightly progressed. L4-L5: Minimal disc bulge. Mild ligamentum flavum hypertrophy. No significant spinal canal  or foraminal stenosis. L5-S1: This level is partly obscured by susceptibility artifact arising from sacroiliac hardware. No definite appreciable disc herniation or stenosis. IMPRESSION: Susceptibility artifact arising from sacroiliac hardware partly obscures the L5-S1 level, and largely obscures the sacral spine. Lumbar spondylosis, as outlined and having slightly progressed from the prior MRI of  04/13/2018. A broad-based disc protrusion contributes to minimal left subarticular stenosis at L3-L4 (without nerve root impingement). This disc protrusion also contributes to mild L3-L4 left inferior neural foraminal narrowing. A disc protrusion contributes to mild relative inferior neural foraminal narrowing on the right at L2-L3. No appreciable significant spinal canal or foraminal stenosis at the remaining levels. Electronically Signed   By: Jackey LogeKyle  Golden DO   On: 05/03/2021 13:28    Procedures Procedures   Medications Ordered in ED Medications  dexamethasone (DECADRON) injection 10 mg (10 mg Intravenous Given 05/03/21 1128)  oxyCODONE-acetaminophen (PERCOCET/ROXICET) 5-325 MG per tablet 1 tablet (1 tablet Oral Given 05/03/21 1121)  gadobutrol (GADAVIST) 1 MMOL/ML injection 10 mL (10 mLs Intravenous Contrast Given 05/03/21 1235)  HYDROmorphone (DILAUDID) injection 0.5 mg (0.5 mg Intravenous Given 05/03/21 1330)  ondansetron (ZOFRAN) injection 4 mg (4 mg Intravenous Given 05/03/21 1330)  oxyCODONE-acetaminophen (PERCOCET/ROXICET) 5-325 MG per tablet 1 tablet (1 tablet Oral Given 05/03/21 1518)  ketorolac (TORADOL) 30 MG/ML injection 15 mg (15 mg Intravenous Given 05/03/21 1518)    ED Course  I have reviewed the triage vital signs and the nursing notes.  Pertinent labs & imaging results that were available during my care of the patient were reviewed by me and considered in my medical decision making (see chart for details).    MDM Rules/Calculators/A&P                           Imaging reviewed and discussed with pt with no emergent findings on todays MRI, although she does have advancing disk protrusions left L3-4 and right L2-3, no cord or obvious nerve root compression, no cauda equinae.  Pt was placed on prednisone taper to start tomorrow (received IV decadron today),  oxycodone prescribed after review of [place] database.  Encouraged close [facility] if sx persist or  worsen. Outlined other home tx, activity, heat.  The patient appears reasonably screened and/or stabilized for discharge and I doubt any other medical condition or other [facility] requiring further screening, evaluation, or treatment in the ED at this time prior to discharge.  Final Clinical Impression(s) / ED Diagnoses Final diagnoses:  Lumbar radiculopathy    Rx / DC Orders ED Discharge Orders          Ordered    predniSONE (DELTASONE) 10 MG tablet  Status:  Discontinued        05/03/21 1449    oxyCODONE-acetaminophen (PERCOCET/ROXICET) 5-325 MG tablet  Every 4 hours PRN        05/03/21 1449    predniSONE (DELTASONE) 10 MG tablet        05/03/21 1454             Victoriano Laindol, Mishaal Lansdale, PA-C 05/04/21 1912    Mancel BaleWentz, Elliott, MD 05/05/21 [phone]

## 2021-05-03 NOTE — ED Triage Notes (Signed)
Pt c/o right buttocks pain shooting down right leg, worsening since last night. Pt reports this pain has been going on since February but hasn't been nearly this bad. Pt also reports she woke up this morning with her right leg feeling weak.

## 2021-05-03 NOTE — Discharge Instructions (Addendum)
Your MRI today is stable with no evidence of need for emergent surgery as discussed.  Take your next dose of prednisone tomorrow morning.  Use the the other medicines as directed.  Do not drive within 4 hours of taking oxycodone as this will make you drowsy.  Avoid lifting,  Bending,  Twisting or any other activity that worsens your pain over the next week.  Apply heat to your lower back for 20 minutes several times daily.  You should get rechecked if your symptoms worsen or are improving with todays treatment plan.  Get rechecked urgently for weakness in your leg(s) or loss of bladder or bowel function - these are symptoms of a worsening condition.

## 2021-05-20 ENCOUNTER — Other Ambulatory Visit: Payer: Self-pay | Admitting: Family Medicine

## 2021-06-26 ENCOUNTER — Other Ambulatory Visit (HOSPITAL_COMMUNITY): Payer: Self-pay | Admitting: Internal Medicine

## 2021-06-26 ENCOUNTER — Other Ambulatory Visit (HOSPITAL_COMMUNITY): Payer: Self-pay | Admitting: Unknown Physician Specialty

## 2021-06-26 DIAGNOSIS — Z1231 Encounter for screening mammogram for malignant neoplasm of breast: Secondary | ICD-10-CM

## 2021-07-05 ENCOUNTER — Ambulatory Visit (HOSPITAL_COMMUNITY)
Admission: RE | Admit: 2021-07-05 | Discharge: 2021-07-05 | Disposition: A | Discharge status: Home / Self Care | Payer: 59 | Source: Ambulatory Visit | Attending: Unknown Physician Specialty | Admitting: Unknown Physician Specialty

## 2021-07-05 ENCOUNTER — Other Ambulatory Visit: Payer: Self-pay

## 2021-07-05 DIAGNOSIS — Z1231 Encounter for screening mammogram for malignant neoplasm of breast: Secondary | ICD-10-CM

## 2021-08-16 ENCOUNTER — Emergency Department (HOSPITAL_COMMUNITY): Payer: 59

## 2021-08-16 ENCOUNTER — Emergency Department (HOSPITAL_COMMUNITY)
Admission: EM | Admit: 2021-08-16 | Discharge: 2021-08-16 | Disposition: A | Discharge status: Home / Self Care | Payer: 59 | Attending: Emergency Medicine | Admitting: Emergency Medicine

## 2021-08-16 ENCOUNTER — Encounter (HOSPITAL_COMMUNITY): Payer: Self-pay | Admitting: Emergency Medicine

## 2021-08-16 ENCOUNTER — Other Ambulatory Visit: Payer: Self-pay

## 2021-08-16 DIAGNOSIS — R479 Unspecified speech disturbances: Secondary | ICD-10-CM | POA: Insufficient documentation

## 2021-08-16 DIAGNOSIS — R202 Paresthesia of skin: Secondary | ICD-10-CM | POA: Diagnosis not present

## 2021-08-16 DIAGNOSIS — R519 Headache, unspecified: Secondary | ICD-10-CM | POA: Insufficient documentation

## 2021-08-16 DIAGNOSIS — Z7951 Long term (current) use of inhaled steroids: Secondary | ICD-10-CM | POA: Diagnosis not present

## 2021-08-16 DIAGNOSIS — R262 Difficulty in walking, not elsewhere classified: Secondary | ICD-10-CM | POA: Insufficient documentation

## 2021-08-16 DIAGNOSIS — R200 Anesthesia of skin: Secondary | ICD-10-CM

## 2021-08-16 DIAGNOSIS — J45909 Unspecified asthma, uncomplicated: Secondary | ICD-10-CM | POA: Diagnosis not present

## 2021-08-16 DIAGNOSIS — N9489 Other specified conditions associated with female genital organs and menstrual cycle: Secondary | ICD-10-CM | POA: Insufficient documentation

## 2021-08-16 DIAGNOSIS — Z87891 Personal history of nicotine dependence: Secondary | ICD-10-CM | POA: Insufficient documentation

## 2021-08-16 DIAGNOSIS — R110 Nausea: Secondary | ICD-10-CM | POA: Insufficient documentation

## 2021-08-16 LAB — COMPREHENSIVE METABOLIC PANEL
ALT: 25 U/L (ref 0–44)
AST: 22 U/L (ref 15–41)
Albumin: 4 g/dL (ref 3.5–5.0)
Alkaline Phosphatase: 84 U/L (ref 38–126)
Anion gap: 8 (ref 5–15)
BUN: 15 mg/dL (ref 6–20)
CO2: 30 mmol/L (ref 22–32)
Calcium: 9.1 mg/dL (ref 8.9–10.3)
Chloride: 102 mmol/L (ref 98–111)
Creatinine, Ser: 0.93 mg/dL (ref 0.44–1.00)
GFR, Estimated: >60 mL/min (ref >60)
Glucose, Bld: 86 mg/dL (ref 70–99)
Potassium: 3.1 mmol/L — ABNORMAL LOW (ref 3.5–5.1)
Sodium: 140 mmol/L (ref 135–145)
Total Bilirubin: 0.5 mg/dL (ref 0.3–1.2)
Total Protein: 7.1 g/dL (ref 6.5–8.1)

## 2021-08-16 LAB — URINALYSIS, ROUTINE W REFLEX MICROSCOPIC
Bilirubin Urine: NEGATIVE
Glucose, UA: NEGATIVE mg/dL
Hgb urine dipstick: NEGATIVE
Ketones, ur: 5 mg/dL — AB
Leukocytes,Ua: NEGATIVE
Nitrite: NEGATIVE
Protein, ur: NEGATIVE mg/dL
Specific Gravity, Urine: 1.008 (ref 1.005–1.030)
pH: 7 (ref 5.0–8.0)

## 2021-08-16 LAB — CBC WITH DIFFERENTIAL/PLATELET
Abs Immature Granulocytes: 0.01 10*3/uL (ref 0.00–0.07)
Basophils Absolute: 0 10*3/uL (ref 0.0–0.1)
Basophils Relative: 0 %
Eosinophils Absolute: 0 10*3/uL (ref 0.0–0.5)
Eosinophils Relative: 0 %
HCT: 35 % — ABNORMAL LOW (ref 36.0–46.0)
Hemoglobin: 11.7 g/dL — ABNORMAL LOW (ref 12.0–15.0)
Immature Granulocytes: 0 %
Lymphocytes Relative: 35 %
Lymphs Abs: 1.6 10*3/uL (ref 0.7–4.0)
MCH: 27.9 pg (ref 26.0–34.0)
MCHC: 33.4 g/dL (ref 30.0–36.0)
MCV: 83.5 fL (ref 80.0–100.0)
Monocytes Absolute: 0.5 10*3/uL (ref 0.1–1.0)
Monocytes Relative: 10 %
Neutro Abs: 2.6 10*3/uL (ref 1.7–7.7)
Neutrophils Relative %: 55 %
Platelets: 351 10*3/uL (ref 150–400)
RBC: 4.19 MIL/uL (ref 3.87–5.11)
RDW: 14.2 % (ref 11.5–15.5)
WBC: 4.7 10*3/uL (ref 4.0–10.5)
nRBC: 0 % (ref 0.0–0.2)

## 2021-08-16 LAB — HCG, QUANTITATIVE, PREGNANCY: hCG, Beta Chain, Quant, S: <1 m[IU]/mL (ref <5)

## 2021-08-16 LAB — ETHANOL: Alcohol, Ethyl (B): <10 mg/dL (ref <10)

## 2021-08-16 LAB — CBG MONITORING, ED: Glucose-Capillary: 67 mg/dL — ABNORMAL LOW (ref 70–99)

## 2021-08-16 MED ORDER — SODIUM CHLORIDE 0.9 % IV BOLUS
1000.0000 mL | Freq: Once | INTRAVENOUS | Status: AC
  Administered 2021-08-16: 1000 mL via INTRAVENOUS

## 2021-08-16 MED ORDER — SODIUM CHLORIDE 0.9 % IV SOLN: INTRAVENOUS | Status: DC

## 2021-08-16 MED ORDER — METOCLOPRAMIDE HCL 5 MG/ML IJ SOLN
10.0000 mg | Freq: Once | INTRAMUSCULAR | Status: AC
  Administered 2021-08-16: 10 mg via INTRAVENOUS
  Filled 2021-08-16: qty 2

## 2021-08-16 MED ORDER — DIPHENHYDRAMINE HCL 50 MG/ML IJ SOLN
25.0000 mg | Freq: Once | INTRAMUSCULAR | Status: AC
  Administered 2021-08-16: 25 mg via INTRAVENOUS
  Filled 2021-08-16: qty 1

## 2021-08-16 MED ORDER — KETOROLAC TROMETHAMINE 30 MG/ML IJ SOLN
15.0000 mg | Freq: Once | INTRAMUSCULAR | Status: AC
  Administered 2021-08-16: 15 mg via INTRAVENOUS
  Filled 2021-08-16: qty 1

## 2021-08-16 NOTE — ED Provider Notes (Signed)
[facility] EMERGENCY DEPARTMENT Provider Note   CSN: [id number] Arrival date & time: 08/16/21  1140     History Chief Complaint  Patient presents with   Altered Mental Status    Jodi [name] is a 45 y.o. female.  HPI Patient presents with concern of a sluggish sensation.  She notes that she awoke in her usual state of health about 4 hours ago, soon thereafter felt groggy, confused.  She feels as though she has difficulty with walking, and right-sided dysesthesia.  No true weakness, though her right side feels different upper and lower extremity.  She notes slowness of speech, but no overt confusion.  Boyfriend is with the patient, corroborates history, adds that the patient had difficulty with ambulation.  No obvious precipitant, and since onset no clear alleviating or exacerbating factors. She does take multiple sedatives, but last was yesterday, appropriately, as prescribed.     Past Medical History:  Diagnosis Date   Abdominal pain, chronic, right upper quadrant    Acute recurrent maxillary sinusitis 11/04/2014   Allergic rhinitis    Anemia    Angio-edema    Arthritis    knee   Asthma    Cough variant   Asthma    Asthma    Back pain    Chronic pain syndrome 06/08/2013   DVT (deep venous thrombosis) (HCC) 10 yrs ago   Dyslipidemia 08/11/2012   Eczema    Fractures    GERD (gastroesophageal reflux disease)    Heart murmur    as a child   Hyperlipidemia    IBS (irritable bowel syndrome)    Lung granuloma (HCC) 07/22/2011   1mm per CT chest   Pneumonia    Shortness of breath    SOB even without exertion at times   Sickle-cell trait [facility])     Patient Active Problem List   Diagnosis Date Noted   Seasonal and perennial allergic rhinoconjunctivitis 02/05/2020   Paradoxical vocal cord motion 02/05/2020   Moderate persistent asthma, uncomplicated 09/24/2017   Seasonal and perennial allergic rhinitis 09/24/2017   SI joint arthritis 02/21/2016   SI (sacroiliac)  joint dysfunction 09/04/2015   Fall from horse 07/30/2015   Lumbar transverse process fracture (HCC) 07/30/2015   Pelvic ring fracture (HCC) 07/28/2015   Primary generalized (osteo)arthritis 03/13/2015   Asthma 11/17/2014   Gastroesophageal reflux disease 11/17/2014   Vitamin D deficiency 11/04/2014   Dyslipidemia 08/11/2012   Lung granuloma (HCC) 07/22/2011   Allergic rhinitis 02/19/2010    Past Surgical History:  Procedure Laterality Date   ABDOMINAL HYSTERECTOMY  4-5 yrs ago   ABDOMINAL HYSTERECTOMY     blood clot removed from lower abdomen  2000   CHOLECYSTECTOMY  11/12/2012   Procedure: LAPAROSCOPIC CHOLECYSTECTOMY;  Surgeon: Dalia Heading, MD;  Location: [facility] ORS;  Service: General;  Laterality: N/A;   CHOLECYSTECTOMY     COLONOSCOPY  11/04/2012   Procedure: COLONOSCOPY;  Surgeon: Malissa Hippo, MD;  Location: [facility] ENDO SUITE;  Service: Endoscopy;  Laterality: N/A;  915   cyst removed from ovary and fluid removed from tubes  2000   ESOPHAGOGASTRODUODENOSCOPY  10/22/2012   Procedure: ESOPHAGOGASTRODUODENOSCOPY (EGD);  Surgeon: Malissa Hippo, MD;  Location: [facility] ENDO SUITE;  Service: Endoscopy;  Laterality: N/A;  100   FRACTURE SURGERY     HARDWARE REMOVAL Left 02/21/2016   Procedure: HARDWARE REMOVAL LEFT SACROILIAC JOINT;  Surgeon: Myrene Galas, MD;  Location: [facility] OR;  Service: Orthopedics;  Laterality: Left;   ORIF PELVIC  FRACTURE N/A 07/30/2015   Procedure: OPEN REDUCTION INTERNAL FIXATION (ORIF) PELVIC FRACTURE;  Surgeon: Myrene Galas, MD;  Location: [facility] OR;  Service: Orthopedics;  Laterality: N/A;   PARTIAL HYSTERECTOMY  2009   SACRO-ILIAC PINNING Right 07/30/2015   Procedure: Loyal Gambler;  Surgeon: Myrene Galas, MD;  Location: [facility] OR;  Service: Orthopedics;  Laterality: Right;   SACRO-ILIAC PINNING Left 09/04/2015   Procedure: LEFT TO RIGHT TRANS SACRO-ILIAC SCREW;  Surgeon: Myrene Galas, MD;  Location: [facility] OR;  Service: Orthopedics;  Laterality: Left;   SACROILIAC  JOINT FUSION Left 02/21/2016   Procedure: LEFT SACROILIAC JOINT FUSION;  Surgeon: Myrene Galas, MD;  Location: [facility] OR;  Service: Orthopedics;  Laterality: Left;   TUBAL LIGATION  1999     OB History     Gravida  0   Para  0   Term  0   Preterm  0   AB  0   Living         SAB  0   IAB  0   Ectopic  0   Multiple      Live Births              Family History  Problem Relation Age of Onset   Asthma Mother    Hypertension Mother    Bronchiolitis Mother    Hypertension Father    Hyperlipidemia Father        recurrent rectum polyps    Allergic rhinitis Father    Diabetes Sister    Lupus Sister    Depression Sister    Angioedema Neg Hx    Atopy Neg Hx    Eczema Neg Hx    Immunodeficiency Neg Hx    Urticaria Neg Hx     Social History   Tobacco Use   Smoking status: Former    Packs/day: 0.25    Years: 10.00    Pack years: 2.50    Types: Cigarettes    Quit date: 10/13/2008    Years since quitting: 12.8   Smokeless tobacco: Never  Vaping Use   Vaping Use: Never used  Substance Use Topics   Alcohol use: No   Drug use: No    Home Medications Prior to Admission medications   Medication Sig Start Date End Date Taking? Authorizing Provider  albuterol (VENTOLIN HFA) 108 (90 Base) MCG/ACT inhaler Inhale 1-2 puffs into the lungs every 4 (four) hours as needed for wheezing or shortness of breath. 02/18/21  Yes [provider]  celecoxib (CELEBREX) 200 MG capsule Take 200 mg by mouth daily. 07/09/21  Yes [provider]  desipramine (NORPRAMIN) 50 MG tablet Take 50 mg by mouth at bedtime.   Yes [provider]  fluticasone Aleda Grana) 50 MCG/ACT nasal spray Use 2 spray(s) in each nostril once daily 07/20/19  Yes Alfonse Spruce, MD  fluticasone-salmeterol (ADVAIR [facility]) [id number] MCG/ACT inhaler Inhale 2 puffs into the lungs 2 (two) times daily. 01/04/20  Yes Alfonse Spruce, MD  hydrochlorothiazide (HYDRODIURIL) 25 MG tablet Take 25  mg by mouth daily. 07/06/20  Yes [provider]  ipratropium (ATROVENT) 0.02 % nebulizer solution Take 2.5 mLs (0.5 mg total) by nebulization every 4 (four) hours as needed for wheezing or shortness of breath. 01/04/20  Yes Alfonse Spruce, MD  ipratropium-albuterol (DUONEB) 0.5-2.5 (3) MG/3ML SOLN Take 3 mLs by nebulization every 6 (six) hours as needed (shortness of breath).    Yes [provider]  levocetirizine (XYZAL) 5 MG tablet TAKE  1 TABLET BY MOUTH ONCE DAILY AS NEEDED FOR ALLERGIES Patient taking differently: Take 5 mg by mouth daily as needed for allergies. 05/21/21  Yes Ellamae Sia, DO  montelukast (SINGULAIR) 10 MG tablet TAKE ONE TABLET BY MOUTH IN THE MORNING Patient taking differently: Take 10 mg by mouth in the morning. 04/28/16  Yes Kerri Perches, MD  omeprazole (PRILOSEC) 20 MG capsule Take 1 capsule (20 mg total) by mouth daily. 01/05/14  Yes Setzer, Terri L, NP  pravastatin (PRAVACHOL) 20 MG tablet Take 20 mg by mouth daily.   Yes [provider]  pregabalin (LYRICA) 50 MG capsule Take 100 mg by mouth at bedtime. 06/19/21  Yes [provider]  tiZANidine (ZANAFLEX) 4 MG tablet Take 4 mg by mouth 3 (three) times daily. 08/02/20  Yes [provider]  levalbuterol (XOPENEX HFA) 45 MCG/ACT inhaler Inhale 2 puffs into the lungs every 4 (four) hours as needed for wheezing. Patient not taking: Reported on 08/16/2021 12/21/19 01/20/20  Alfonse Spruce, MD  oxyCODONE-acetaminophen (PERCOCET/ROXICET) 5-325 MG tablet Take 1 tablet by mouth every 4 (four) hours as needed. Patient not taking: No sig reported 05/03/21   Burgess Amor, PA-C  predniSONE (DELTASONE) 10 MG tablet 6, 5, 4, 3, 2 then 1 tablet by mouth daily for 6 days total. Patient not taking: No sig reported 05/03/21   Burgess Amor, PA-C    Allergies    Prochlorperazine  Review of Systems   Review of Systems  Constitutional:        Per HPI, otherwise negative  HENT:          Per HPI, otherwise negative  Respiratory:         Per HPI, otherwise negative  Cardiovascular:        Per HPI, otherwise negative  Gastrointestinal:  Negative for vomiting.  Endocrine:       Negative aside from HPI  Genitourinary:        Neg aside from HPI   Musculoskeletal:        Per HPI, otherwise negative  Skin: Negative.   Neurological:  Positive for speech difficulty. Negative for syncope.   Physical Exam Updated Vital Signs BP (!) 158/97   Pulse 80   Temp 97.9 F (36.6 C) (Oral)   Resp 14   Ht 5\' 6"  (1.676 m)   Wt 103.3 kg   LMP 05/17/2012   SpO2 100%   BMI 36.76 kg/m   Physical Exam Vitals and nursing note reviewed.  Constitutional:      General: She is not in acute distress.    Appearance: She is well-developed. She is obese. She is not ill-appearing or diaphoretic.  HENT:     Head: Normocephalic and atraumatic.  Eyes:     Conjunctiva/sclera: Conjunctivae normal.  Cardiovascular:     Rate and Rhythm: Normal rate and regular rhythm.  Pulmonary:     Effort: Pulmonary effort is normal. No respiratory distress.     Breath sounds: Normal breath sounds. No stridor.  Abdominal:     General: There is no distension.  Skin:    General: Skin is warm and dry.  Neurological:     Mental Status: She is alert and oriented to person, place, and time.     Cranial Nerves: No cranial nerve deficit.     Motor: No weakness or tremor.     Comments: Speech is somewhat slow, but clear, appropriate.    ED Results / Procedures / Treatments   Labs (  all labs ordered are listed, but only abnormal results are displayed) Labs Reviewed  COMPREHENSIVE METABOLIC PANEL - Abnormal; Notable for the following components:      Result Value   Potassium 3.1 (*)    All other components within normal limits  CBC WITH DIFFERENTIAL/PLATELET - Abnormal; Notable for the following components:   Hemoglobin 11.7 (*)    HCT 35.0 (*)    All other components within normal limits  CBG MONITORING,  ED - Abnormal; Notable for the following components:   Glucose-Capillary 67 (*)    All other components within normal limits  ETHANOL  HCG, QUANTITATIVE, PREGNANCY  URINALYSIS, ROUTINE W REFLEX MICROSCOPIC    EKG EKG Interpretation  Date/Time:  Friday August 16 2021 11:58:48 EDT Ventricular Rate:  85 PR Interval:  180 QRS Duration: 88 QT Interval:  417 QTC Calculation: 496 R Axis:   75 Text Interpretation: Sinus rhythm Borderline T abnormalities, diffuse leads Borderline prolonged QT interval Otherwise within normal limits Confirmed by Gerhard Munch [phone]) on 08/16/2021 12:19:00 PM  Radiology CT Head Wo Contrast  Result Date: 08/16/2021 CLINICAL DATA:  Neuro deficit, decreased strength EXAM: CT HEAD WITHOUT CONTRAST TECHNIQUE: Contiguous axial images were obtained from the base of the skull through the vertex without intravenous contrast. COMPARISON:  None. FINDINGS: Brain: No acute intracranial hemorrhage, mass effect, or herniation. No extra-axial fluid collections. No evidence of acute territorial infarct, limitations at the posterior and middle cranial fossas due to bone artifacts. No hydrocephalus. Vascular: No hyperdense vessel or unexpected calcification. Skull: Normal. Negative for fracture or focal lesion. Sinuses/Orbits: No acute finding. Other: None. IMPRESSION: No acute intracranial process identified. Electronically Signed   By: Jannifer Hick M.D.   On: 08/16/2021 14:19   DG Chest Port 1 View  Result Date: 08/16/2021 CLINICAL DATA:  Patient reports feeling groggy and confused a little after 8am this morning. States she can walk but not straight. Feels like right side has decreased sensation, equal grip strength noted. Boyfriend at bedside and feels like patient is speaking slower then usual. EXAM: PORTABLE CHEST 1 VIEW COMPARISON:  08/03/2017 FINDINGS: Cardiac silhouette is normal in size. Normal mediastinal and hilar contours. Clear lungs.  No pleural effusion or  pneumothorax. Skeletal structures are grossly intact. IMPRESSION: No active disease. Electronically Signed   By: Amie Portland M.D.   On: 08/16/2021 12:52    Procedures Procedures   Medications Ordered in ED Medications  sodium chloride 0.9 % bolus 1,000 mL (0 mLs Intravenous Stopped 08/16/21 1355)    And  0.9 %  sodium chloride infusion ( Intravenous New Bag/Given 08/16/21 1355)  ketorolac (TORADOL) 30 MG/ML injection 15 mg (has no administration in time range)  metoCLOPramide (REGLAN) injection 10 mg (has no administration in time range)  diphenhydrAMINE (BENADRYL) injection 25 mg (has no administration in time range)    ED Course  I have reviewed the triage vital signs and the nursing notes.  Pertinent labs & imaging results that were available during my care of the patient were reviewed by me and considered in my medical decision making (see chart for details).   4:14 PM On repeat exam patient is awake, alert, in no distress, continues to move all extremities spontaneously.  Speech is intelligible, no new complaints.  We reviewed findings thus far and have discussed them with her, and her boyfriend. No CT evidence for acute new pathology, man with reassuring physical exam, some suspicion for migraine with associated numbness contributing to her presentation.  With reassuring  findings patient likely appropriate for discharge, though on signout patient is awaiting repeat evaluation for her headache, which she has been written medications, and urinalysis results.  MDM Rules/Calculators/A&P MDM Number of Diagnoses or Management Options Bad headache: new, needed workup Nausea: new, needed workup Numbness: new, needed workup   Amount and/or Complexity of Data Reviewed Clinical lab tests: ordered and reviewed Tests in the radiology section of CPT: ordered and reviewed Tests in the medicine section of CPT: reviewed and ordered Decide to obtain previous medical records or to obtain  history from someone other than the patient: yes Obtain history from someone other than the patient: yes Review and summarize past medical records: yes Independent visualization of images, tracings, or specimens: yes  Risk of Complications, Morbidity, and/or Mortality Presenting problems: high Diagnostic procedures: high Management options: high  Critical Care Total time providing critical care: < 30 minutes  Patient Progress Patient progress: improved   Final Clinical Impression(s) / ED Diagnoses Final diagnoses:  Bad headache  Numbness  Nausea      Gerhard Munch, MD 08/16/21 1615

## 2021-08-16 NOTE — Discharge Instructions (Addendum)
As discussed, your evaluation today has been largely reassuring.  But, it is important that you monitor your condition carefully, and do not hesitate to return to the ED if you develop new, or concerning changes in your condition. ? ?Otherwise, please follow-up with your physician for appropriate ongoing care. ? ?

## 2021-08-16 NOTE — ED Notes (Signed)
Patient transported to CT 

## 2021-08-16 NOTE — ED Triage Notes (Signed)
Patient reports feeling groggy and confused a little after 8am this morning. States she can walk but not straight. Feels like right side has decreased sensation, equal grip strength noted.  Boyfriend at bedside and feels like patient is speaking slower then usual. And while walking beside him she was bumping into him a lot.  CBG 67

## 2021-08-26 ENCOUNTER — Other Ambulatory Visit: Payer: Self-pay | Admitting: Allergy

## 2021-09-11 ENCOUNTER — Encounter: Payer: Self-pay | Admitting: Allergy & Immunology

## 2021-09-11 ENCOUNTER — Other Ambulatory Visit: Payer: Self-pay

## 2021-09-11 ENCOUNTER — Ambulatory Visit: Payer: 59 | Admitting: Allergy & Immunology

## 2021-09-11 VITALS — BP 130/98 | HR 86 | Temp 97.9°F | Resp 16 | Ht 66.0 in | Wt 231.8 lb

## 2021-09-11 DIAGNOSIS — J4540 Moderate persistent asthma, uncomplicated: Secondary | ICD-10-CM

## 2021-09-11 DIAGNOSIS — J383 Other diseases of vocal cords: Secondary | ICD-10-CM

## 2021-09-11 DIAGNOSIS — J3089 Other allergic rhinitis: Secondary | ICD-10-CM

## 2021-09-11 DIAGNOSIS — J302 Other seasonal allergic rhinitis: Secondary | ICD-10-CM

## 2021-09-11 DIAGNOSIS — K219 Gastro-esophageal reflux disease without esophagitis: Secondary | ICD-10-CM

## 2021-09-11 MED ORDER — LEVOCETIRIZINE DIHYDROCHLORIDE 5 MG PO TABS: ORAL_TABLET | ORAL | 2 refills | Status: DC

## 2021-09-11 MED ORDER — OMEPRAZOLE 20 MG PO CPDR: 20.0000 mg | DELAYED_RELEASE_CAPSULE | Freq: Every day | ORAL | 2 refills | Status: AC

## 2021-09-11 MED ORDER — MONTELUKAST SODIUM 10 MG PO TABS: 10.0000 mg | ORAL_TABLET | Freq: Every morning | ORAL | 2 refills | Status: DC

## 2021-09-11 NOTE — Progress Notes (Signed)
FOLLOW UP  Date of Service/Encounter:  09/11/21   Assessment:   Moderate persistent asthma - likely with VCD component    Gastroesophageal reflux disease - on PPI   Seasonal and perennial allergic rhinitis (weeds, grasses, indoor molds, outdoor molds, dust mites, cat and dog)   Chronic back pain - on disability from [employer] at this time   Declines COVID-19 vaccine  Plan/Recommendations:   1. Moderate persistent asthma, uncomplicated - Lung testing looks awesome today. - We are not going to make any changes at this time.  - I did offer her to decrease the Advair dosing but she prefers to just stay the course.  - Daily controller medication(s): Singulair 10mg  daily and Advair 230/72mcg two puffs twice daily with spacer - Prior to physical activity: albuterol 2 puffs 10-15 minutes before physical activity. - Rescue medications: albuterol 4 puffs every 4-6 hours as needed - Changes during respiratory infections or worsening symptoms: Increase Advair 230/77mcg to 4 puffs twice daily for TWO WEEKS. - Asthma control goals:  * Full participation in all desired activities (may need albuterol before activity) * Albuterol use two time or less a week on average (not counting use with activity) * Cough interfering with sleep two time or less a month * Oral steroids no more than once a year * No hospitalizations  2. Chronic rhinitis (weeds, grasses, indoor molds, outdoor molds, dust mites, cat and dog) - Continue with: Xyzal (levocetirizine) 5mg  tablet once daily as needed, Singulair (montelukast) 10mg  daily and Flonase (fluticasone) two sprays per nostril daily  - Continue to hold allergy shots.   3. Reflux - Continue with Prilosec daily.  4. Return in about 6 months (around 03/11/2022).   Subjective:   Jodi [name] is a 45 y.o. female presenting today for follow up of  Chief Complaint  Patient presents with   Asthma    Patient had a flare up and had throat  swelling, runny nose, bad cough, put on prednisone - finished it about a week ago. She said she is doing better now and not having anymore symptoms.    Jodi [name] has a history of the following: Patient Active Problem List   Diagnosis Date Noted   Seasonal and perennial allergic rhinoconjunctivitis 02/05/2020   Paradoxical vocal cord motion 02/05/2020   Moderate persistent asthma, uncomplicated 09/24/2017   Seasonal and perennial allergic rhinitis 09/24/2017   SI joint arthritis 02/21/2016   SI (sacroiliac) joint dysfunction 09/04/2015   Fall from horse 07/30/2015   Lumbar transverse process fracture (HCC) 07/30/2015   Pelvic ring fracture (HCC) 07/28/2015   Primary generalized (osteo)arthritis 03/13/2015   Asthma 11/17/2014   Gastroesophageal reflux disease 11/17/2014   Vitamin D deficiency 11/04/2014   Dyslipidemia 08/11/2012   Lung granuloma (HCC) 07/22/2011   Allergic rhinitis 02/19/2010    History obtained from: chart review and patient.  Jodi [name] is a 45 y.o. female presenting for a follow up visit.  He was last seen in 2022.  At that time, we continue with montelukast as well as Advair 230 mcg 2 puffs twice daily and Xopenex as needed.  For her allergic rhinitis, we continued with allergy avoidance as well as Xyzal, montelukast, and Flonase.  Reflux was controlled with Prilosec.  Since last visit, she has mostly done well.   Asthma/Respiratory Symptom History: She did have a flare up recently and her asthma ended up acting up. She was getting to coughing like crazy and her throat had swollen up.  She coughed more at night than during the day. She remains on the Advair two puffs twice daily during weather changes. She seems to be doing fairly well with this. She did get prednisone during this most recent attack. She had not restarted her Advair. She has been only using the Advair during weather changes which mostly seems to be working.  She no longer sees Dr. Rubye Oaks. She does  use her exercises that she learned during those visits with the speech therapist.   Allergic Rhinitis Symptom History: Environmental allergies are fairly well controlled with medications. She does have several dogs who never seem to bother her.  However she got a stray dog in her car that "set [her] off". She had to have a lot of antihistamines to get this back in check. She is no longer on the allergy shots, but she does think that her stent on them did help.  She feels a lot better than she did before starting the allergy shots.  She continues to be denied for her disability. She is on long term disability with her job from [employer]. She has had her permanent disability denied but she submitted for another consideration.   She remains very active with her dogs.  She raises dogs, especially show dogs.  She supposed to be several pictures of her pets which are quite beautiful.  A couple of them have made it to dog shows.  Otherwise, there have been no changes to her past medical history, surgical history, family history, or social history.    Review of Systems  Constitutional: Negative.  Negative for chills, fever, malaise/fatigue and weight loss.  HENT: Negative.  Negative for congestion, ear discharge, ear pain and sinus pain.   Eyes:  Negative for pain, discharge and redness.  Respiratory:  Negative for cough, sputum production, shortness of breath and wheezing.   Cardiovascular: Negative.  Negative for chest pain and palpitations.  Gastrointestinal:  Negative for abdominal pain, constipation, diarrhea, heartburn, nausea and vomiting.  Musculoskeletal:  Positive for back pain.  Skin: Negative.  Negative for itching and rash.  Neurological:  Negative for dizziness and headaches.  Endo/Heme/Allergies:  Negative for environmental allergies. Does not bruise/bleed easily.      Objective:   Blood pressure (!) 130/98, pulse 86, temperature 97.9 F (36.6 C), temperature source  Temporal, resp. rate 16, height 5\' 6"  (1.676 m), weight 231 lb 12.8 oz (105.1 kg), last menstrual period 05/17/2012, SpO2 99 %. Body mass index is 37.41 kg/m.   Physical Exam:  Physical Exam Vitals reviewed.  Constitutional:      Appearance: She is well-developed.     Comments: Smiling.  HENT:     Head: Normocephalic and atraumatic.     Right Ear: Tympanic membrane, ear canal and external ear normal.     Left Ear: Tympanic membrane, ear canal and external ear normal.     Nose: No nasal deformity, septal deviation, mucosal edema or rhinorrhea.     Right Turbinates: Enlarged, swollen and pale.     Left Turbinates: Enlarged, swollen and pale.     Right Sinus: No maxillary sinus tenderness or frontal sinus tenderness.     Left Sinus: No maxillary sinus tenderness or frontal sinus tenderness.     Comments: Minimal sinus tenderness.    Mouth/Throat:     Mouth: Mucous membranes are not pale and not dry.     Pharynx: Uvula midline.  Eyes:     General:  Right eye: No discharge.        Left eye: No discharge.     Conjunctiva/sclera: Conjunctivae normal.     Right eye: Right conjunctiva is not injected. No chemosis.    Left eye: Left conjunctiva is not injected. No chemosis.    Pupils: Pupils are equal, round, and reactive to light.  Cardiovascular:     Rate and Rhythm: Normal rate and regular rhythm.     Heart sounds: Normal heart sounds.  Pulmonary:     Effort: Pulmonary effort is normal. No tachypnea, accessory muscle usage or respiratory distress.     Breath sounds: Normal breath sounds. No wheezing, rhonchi or rales.     Comments: Moving air well in all lung fields.  No increased work of breathing. Chest:     Chest wall: No tenderness.  Lymphadenopathy:     Cervical: No cervical adenopathy.  Skin:    Coloration: Skin is not pale.     Findings: No abrasion, erythema, petechiae or rash. Rash is not papular, urticarial or vesicular.     Comments: No eczematous or  urticarial lesions noted.  Neurological:     Mental Status: She is alert.  Psychiatric:        Behavior: Behavior is cooperative.     Diagnostic studies:    Spirometry: results normal (FEV1: 2.67/102%, FVC: 3.17/98%, FEV1/FVC: 84%).    Spirometry consistent with normal pattern.    Allergy Studies: none        Malachi Bonds, MD  [facility]

## 2021-09-11 NOTE — Patient Instructions (Addendum)
1. Moderate persistent asthma, uncomplicated - Lung testing looks awesome today. - We are not going to make any changes at this time.  - Daily controller medication(s): Singulair 10mg  daily and Advair 230/54mcg two puffs twice daily with spacer - Prior to physical activity: albuterol 2 puffs 10-15 minutes before physical activity. - Rescue medications: albuterol 4 puffs every 4-6 hours as needed - Changes during respiratory infections or worsening symptoms: Increase Advair 230/89mcg to 4 puffs twice daily for TWO WEEKS. - Asthma control goals:  * Full participation in all desired activities (may need albuterol before activity) * Albuterol use two time or less a week on average (not counting use with activity) * Cough interfering with sleep two time or less a month * Oral steroids no more than once a year * No hospitalizations  2. Chronic rhinitis (weeds, grasses, indoor molds, outdoor molds, dust mites, cat and dog) - Continue with: Xyzal (levocetirizine) 5mg  tablet once daily as needed, Singulair (montelukast) 10mg  daily and Flonase (fluticasone) two sprays per nostril daily  - Continue to hold allergy shots.   3. Reflux - Continue with Prilosec daily.  4. Return in about 6 months (around 03/11/2022).   Please inform of any Emergency Department visits, hospitalizations, or changes in symptoms. Call before going to the ED for breathing or allergy symptoms since we might be able to fit you in for a sick visit. Feel free to contact 03/13/2022 anytime with any questions, problems, or concerns.  It was a pleasure to see you again today!  Websites that have reliable patient information: 1. American Academy of Asthma, Allergy, and Immunology: www.aaaai.org 2. Food Allergy Research and Education (FARE): foodallergy.org 3. Mothers of Asthmatics: http://www.asthmacommunitynetwork.org 4. American College of Allergy, Asthma, and Immunology: www.acaai.org   COVID-19 Vaccine Information can be  found at: [place] For questions related to vaccine distribution or appointments, please email [email or web address] or call [phone].     "Like" [place] on Facebook and Instagram for our latest updates!     Make sure you are registered to vote! If you have moved or changed any of your contact information, you will need to get this updated before voting!  In some cases, you MAY be able to register to vote online: [email or web address]

## 2021-09-12 ENCOUNTER — Encounter: Payer: Self-pay | Admitting: Allergy & Immunology

## 2022-03-12 ENCOUNTER — Ambulatory Visit: Payer: 59 | Admitting: Allergy & Immunology

## 2022-03-12 ENCOUNTER — Encounter: Payer: Self-pay | Admitting: Allergy & Immunology

## 2022-03-12 VITALS — BP 128/78 | HR 92 | Temp 98.0°F | Resp 20 | Ht 65.0 in | Wt 225.2 lb

## 2022-03-12 DIAGNOSIS — J4540 Moderate persistent asthma, uncomplicated: Secondary | ICD-10-CM

## 2022-03-12 DIAGNOSIS — K219 Gastro-esophageal reflux disease without esophagitis: Secondary | ICD-10-CM

## 2022-03-12 DIAGNOSIS — J383 Other diseases of vocal cords: Secondary | ICD-10-CM | POA: Diagnosis not present

## 2022-03-12 DIAGNOSIS — J3089 Other allergic rhinitis: Secondary | ICD-10-CM

## 2022-03-12 NOTE — Patient Instructions (Addendum)
1. Moderate persistent asthma, uncomplicated - Lung testing looks awesome today. - We are not going to make any changes at this time.  - You have an excellent handle on your symptoms.  - Daily controller medication(s): Singulair 10mg  EVERY DAY and Advair 230/17mcg two puffs twice daily DURING SEASON CHANGES  - Prior to physical activity: albuterol 2 puffs 10-15 minutes before physical activity. - Rescue medications: albuterol 4 puffs every 4-6 hours as needed - Changes during respiratory infections or worsening symptoms: Increase Advair 230/82mcg to 4 puffs twice daily for TWO WEEKS. - Asthma control goals:  * Full participation in all desired activities (may need albuterol before activity) * Albuterol use two time or less a week on average (not counting use with activity) * Cough interfering with sleep two time or less a month * Oral steroids no more than once a year * No hospitalizations  2. Chronic rhinitis (weeds, grasses, indoor molds, outdoor molds, dust mites, cat and dog) - Continue with: Xyzal (levocetirizine) 5mg  tablet once daily and Singulair (montelukast) 10mg  daily - Continue with: Flonase (fluticasone) two sprays per nostril daily  - Continue to hold allergy shots.  - We can always restart them in the future if needed.   3. Reflux - Continue with Prilosec daily.  4. Return in about 6 months (around 09/11/2022).   Please inform us of any Emergency Department visits, hospitalizations, or changes in symptoms. Call us before going to the ED for breathing or allergy symptoms since we might be able to fit you in for a sick visit. Feel free to contact us anytime with any questions, problems, or concerns.  It was a pleasure to see you again today!  Websites that have reliable patient information: 1. American Academy of Asthma, Allergy, and Immunology: www.aaaai.org 2. Food Allergy Research and Education (FARE): foodallergy.org 3. Mothers of Asthmatics:  http://www.asthmacommunitynetwork.org 4. American College of Allergy, Asthma, and Immunology: www.acaai.org   COVID-19 Vaccine Information can be found at: [email or web address] For questions related to vaccine distribution or appointments, please email [email or web address] or call [phone].     "Like" [place] on Facebook and Instagram for our latest updates!     Make sure you are registered to vote! If you have moved or changed any of your contact information, you will need to get this updated before voting!  In some cases, you MAY be able to register to vote online: [email or web address]

## 2022-03-12 NOTE — Progress Notes (Signed)
FOLLOW UP  Date of Service/Encounter:  03/12/22   Assessment:   Moderate persistent asthma - likely with VCD component    Seasonal and perennial allergic rhinitis (weeds, grasses, indoor molds, outdoor molds, dust mites, cat and dog)   Chronic back pain - on disability from [place] and [place] at this time   Declines COVID-19 vaccine  Plan/Recommendations:   1. Moderate persistent asthma, uncomplicated - Lung testing looks awesome today. - We are not going to make any changes at this time.  - You have an excellent handle on your symptoms.  - Daily controller medication(s): Singulair 10mg  EVERY DAY and Advair 230/25mcg two puffs twice daily DURING SEASON CHANGES  - Prior to physical activity: albuterol 2 puffs 10-15 minutes before physical activity. - Rescue medications: albuterol 4 puffs every 4-6 hours as needed - Changes during respiratory infections or worsening symptoms: Increase Advair 230/59mcg to 4 puffs twice daily for TWO WEEKS. - Asthma control goals:  * Full participation in all desired activities (may need albuterol before activity) * Albuterol use two time or less a week on average (not counting use with activity) * Cough interfering with sleep two time or less a month * Oral steroids no more than once a year * No hospitalizations  2. Chronic rhinitis (weeds, grasses, indoor molds, outdoor molds, dust mites, cat and dog) - Continue with: Xyzal (levocetirizine) 5mg  tablet once daily and Singulair (montelukast) 10mg  daily - Continue with: Flonase (fluticasone) two sprays per nostril daily  - Continue to hold allergy shots.  - We can always restart them in the future if needed.   3. Reflux - Continue with Prilosec daily.  4. Return in about 6 months (around 09/11/2022).   Subjective:   Jodi [name] is a 46 y.o. female presenting today for follow up of  Chief Complaint  Patient presents with  . Asthma    Asthma has been doing well. Hasn't had any  flare ups since the last visit.  . Allergic Rhinitis     Allergies are doing well. No allergy flare ups    Jodi [name] has a history of the following: Patient Active Problem List   Diagnosis Date Noted  . Seasonal and perennial allergic rhinoconjunctivitis 02/05/2020  . Paradoxical vocal cord motion 02/05/2020  . Moderate persistent asthma, uncomplicated 09/24/2017  . Seasonal and perennial allergic rhinitis 09/24/2017  . SI joint arthritis 02/21/2016  . SI (sacroiliac) joint dysfunction 09/04/2015  . Fall from horse 07/30/2015  . Lumbar transverse process fracture (HCC) 07/30/2015  . Pelvic ring fracture (HCC) 07/28/2015  . Primary generalized (osteo)arthritis 03/13/2015  . Asthma 11/17/2014  . Gastroesophageal reflux disease 11/17/2014  . Vitamin D deficiency 11/04/2014  . Dyslipidemia 08/11/2012  . 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 Allergic rhinitis 02/19/2010    History obtained from: chart review and patient.  Jodi [name] is a 46 y.o. female presenting for a follow up visit. She was last seen in November 2022. At that time, lung testing looked great today. She preferred to just stay on her same Advair dosing of 230/21 mcg 2 puffs twice daily.  She also continued with Singulair 10 mg daily.  For her rhinitis, we continue with Xyzal 5 mg once daily as needed in addition to Singulair and Flonase.  She had been on allergy shots in the past but was doing fine without them.  Reflux was controlled with Prilosec daily.  Since last visit, she has done well.   Asthma/Respiratory Symptom History: Breathing is  doing very well. She remains on the Advair only with changes of the weather. The season changes are the worse for her. She did not have a bad time with the pollen this year.  She is unsure of the price of her Advair. She has not needed prednisone and has not been to the hospital.   Allergic Rhinitis Symptom History: She remains on the levocetirizine as well as the montelukast daily.  She is much better about doing this compared to his other medications.   She goes to dog shows and they do not bother her at all. She did have a reaction to a doberman pincher and this messed up her asthma.   GERD Symptom History: She has reflux. She continues with her omeprazole daily. She has not needed to take any Tums.   Otherwise, there have been no changes to her past medical history, surgical history, family history, or social history.    Review of Systems  Constitutional: Negative.  Negative for chills, fever, malaise/fatigue and weight loss.  HENT: Negative.  Negative for congestion, ear discharge and ear pain.   Eyes:  Negative for pain, discharge and redness.  Respiratory:  Negative for cough, sputum production, shortness of breath and wheezing.   Cardiovascular: Negative.  Negative for chest pain and palpitations.  Gastrointestinal:  Negative for abdominal pain, constipation, diarrhea, heartburn, nausea and vomiting.  Skin: Negative.  Negative for itching and rash.  Neurological:  Negative for dizziness and headaches.  Endo/Heme/Allergies:  Negative for environmental allergies. Does not bruise/bleed easily.      Objective:   Blood pressure 128/78, pulse 92, temperature 98 F (36.7 C), resp. rate 20, height 5\' 5"  (1.651 m), weight 225 lb 4 oz (102.2 kg), last menstrual period 05/17/2012, SpO2 98 %. Body mass index is 37.48 kg/m.    Physical Exam Vitals reviewed.  Constitutional:      Appearance: She is well-developed.     Comments: Smiling.  HENT:     Head: Normocephalic and atraumatic.     Right Ear: Tympanic membrane, ear canal and external ear normal.     Left Ear: Tympanic membrane, ear canal and external ear normal.     Nose: No nasal deformity, septal deviation, mucosal edema or rhinorrhea.     Right Turbinates: Enlarged, swollen and pale.     Left Turbinates: Enlarged, swollen and pale.     Right Sinus: No maxillary sinus tenderness or frontal sinus  tenderness.     Left Sinus: No maxillary sinus tenderness or frontal sinus tenderness.     Comments: Minimal sinus tenderness.    Mouth/Throat:     Mouth: Mucous membranes are not pale and not dry.     Pharynx: Uvula midline.  Eyes:     General:        Right eye: No discharge.        Left eye: No discharge.     Conjunctiva/sclera: Conjunctivae normal.     Right eye: Right conjunctiva is not injected. No chemosis.    Left eye: Left conjunctiva is not injected. No chemosis.    Pupils: Pupils are equal, round, and reactive to light.  Cardiovascular:     Rate and Rhythm: Normal rate and regular rhythm.     Heart sounds: Normal heart sounds.  Pulmonary:     Effort: Pulmonary effort is normal. No tachypnea, accessory muscle usage or respiratory distress.     Breath sounds: Normal breath sounds. No wheezing, rhonchi or rales.     Comments:  Moving air well in all lung fields.  No increased work of breathing. Chest:     Chest wall: No tenderness.  Lymphadenopathy:     Cervical: No cervical adenopathy.  Skin:    Coloration: Skin is not pale.     Findings: No abrasion, erythema, petechiae or rash. Rash is not papular, urticarial or vesicular.     Comments: No eczematous or urticarial lesions noted.  Neurological:     Mental Status: She is alert.  Psychiatric:        Behavior: Behavior is cooperative.     Diagnostic studies: {Blank single:19197::"none","deferred due to recent antihistamine use","labs sent instead"," "}  Spirometry: {Blank single:19197::"results normal (FEV1: ***%, FVC: ***%, FEV1/FVC: ***%)","results abnormal (FEV1: ***%, FVC: ***%, FEV1/FVC: ***%)"}.    {Blank single:19197::"Spirometry consistent with mild obstructive disease","Spirometry consistent with moderate obstructive disease","Spirometry consistent with severe obstructive disease","Spirometry consistent with possible restrictive disease","Spirometry consistent with mixed obstructive and restrictive  disease","Spirometry uninterpretable due to technique","Spirometry consistent with normal pattern"}. {Blank single:19197::"Albuterol/Atrovent nebulizer","Xopenex/Atrovent nebulizer","Albuterol nebulizer","Albuterol four puffs via MDI","Xopenex four puffs via MDI"} treatment given in clinic with {Blank single:19197::"significant improvement in FEV1 per ATS criteria","significant improvement in FVC per ATS criteria","significant improvement in FEV1 and FVC per ATS criteria","improvement in FEV1, but not significant per ATS criteria","improvement in FVC, but not significant per ATS criteria","improvement in FEV1 and FVC, but not significant per ATS criteria","no improvement"}.  Allergy Studies: {Blank single:19197::"none","labs sent instead"," "}    {Blank single:19197::"Allergy testing results were read and interpreted by myself, documented by clinical staff."," "}      Malachi BondsJoel Elayjah Chaney, MD  [facility] [facility]

## 2022-03-13 MED ORDER — ALBUTEROL SULFATE HFA 108 (90 BASE) MCG/ACT IN AERS
2.0000 | INHALATION_SPRAY | RESPIRATORY_TRACT | 1 refills | Status: DC | PRN
Start: 2022-03-13 — End: 2024-03-18

## 2022-03-13 MED ORDER — MONTELUKAST SODIUM 10 MG PO TABS: 10.0000 mg | ORAL_TABLET | Freq: Every morning | ORAL | 1 refills | Status: DC

## 2022-03-13 MED ORDER — FLUTICASONE-SALMETEROL 115-21 MCG/ACT IN AERO: 2.0000 | INHALATION_SPRAY | Freq: Two times a day (BID) | RESPIRATORY_TRACT | 5 refills | Status: DC

## 2022-03-14 ENCOUNTER — Encounter: Payer: Self-pay | Admitting: Allergy & Immunology

## 2022-03-14 NOTE — Addendum Note (Signed)
Addended by: Alfonse Spruce on: 03/14/2022 05:09 AM   Modules accepted: Orders

## 2022-08-05 ENCOUNTER — Other Ambulatory Visit (HOSPITAL_COMMUNITY): Payer: Self-pay | Admitting: Internal Medicine

## 2022-08-05 DIAGNOSIS — Z1231 Encounter for screening mammogram for malignant neoplasm of breast: Secondary | ICD-10-CM

## 2022-08-14 ENCOUNTER — Ambulatory Visit (HOSPITAL_COMMUNITY)
Admission: RE | Admit: 2022-08-14 | Discharge: 2022-08-14 | Disposition: A | Discharge status: Home / Self Care | Payer: 59 | Source: Ambulatory Visit | Attending: Internal Medicine | Admitting: Internal Medicine

## 2022-08-14 DIAGNOSIS — Z1231 Encounter for screening mammogram for malignant neoplasm of breast: Secondary | ICD-10-CM | POA: Insufficient documentation

## 2022-08-20 ENCOUNTER — Other Ambulatory Visit (HOSPITAL_COMMUNITY): Payer: Self-pay | Admitting: Internal Medicine

## 2022-08-20 DIAGNOSIS — R928 Other abnormal and inconclusive findings on diagnostic imaging of breast: Secondary | ICD-10-CM

## 2022-08-26 ENCOUNTER — Ambulatory Visit (HOSPITAL_COMMUNITY)
Admission: RE | Admit: 2022-08-26 | Discharge: 2022-08-26 | Disposition: A | Discharge status: Home / Self Care | Payer: 59 | Source: Ambulatory Visit | Attending: Internal Medicine | Admitting: Internal Medicine

## 2022-08-26 DIAGNOSIS — R928 Other abnormal and inconclusive findings on diagnostic imaging of breast: Secondary | ICD-10-CM | POA: Diagnosis present

## 2022-09-17 ENCOUNTER — Encounter: Payer: Self-pay | Admitting: Allergy & Immunology

## 2022-09-17 ENCOUNTER — Ambulatory Visit: Payer: 59 | Admitting: Allergy & Immunology

## 2022-09-17 VITALS — BP 130/80 | HR 91 | Temp 97.7°F | Resp 16 | Ht 66.0 in | Wt 220.2 lb

## 2022-09-17 DIAGNOSIS — J4540 Moderate persistent asthma, uncomplicated: Secondary | ICD-10-CM

## 2022-09-17 DIAGNOSIS — J3089 Other allergic rhinitis: Secondary | ICD-10-CM

## 2022-09-17 DIAGNOSIS — K219 Gastro-esophageal reflux disease without esophagitis: Secondary | ICD-10-CM | POA: Diagnosis not present

## 2022-09-17 DIAGNOSIS — J383 Other diseases of vocal cords: Secondary | ICD-10-CM | POA: Diagnosis not present

## 2022-09-17 DIAGNOSIS — J302 Other seasonal allergic rhinitis: Secondary | ICD-10-CM

## 2022-09-17 NOTE — Progress Notes (Signed)
FOLLOW UP  Date of Service/Encounter:  09/17/22   Assessment:   Moderate persistent asthma - likely with VCD component    Seasonal and perennial allergic rhinitis (weeds, grasses, indoor molds, outdoor molds, dust mites, cat and dog)   Chronic back pain - on disability from [place] and [place] at this time  Plan/Recommendations:   1. Moderate persistent asthma, uncomplicated - Lung testing looks awesome today. - We are not going to make any changes at this time.  - You have an excellent handle on your symptoms.  - Daily controller medication(s): Singulair 10mg  EVERY DAY and Advair 230/4mcg two puffs twice daily DURING SEASON CHANGES  - Prior to physical activity: albuterol 2 puffs 10-15 minutes before physical activity. - Rescue medications: albuterol 4 puffs every 4-6 hours as needed - Changes during respiratory infections or worsening symptoms: Increase Advair 230/48mcg to 4 puffs twice daily for TWO WEEKS. - Asthma control goals:  * Full participation in all desired activities (may need albuterol before activity) * Albuterol use two time or less a week on average (not counting use with activity) * Cough interfering with sleep two time or less a month * Oral steroids no more than once a year * No hospitalizations  2. Chronic rhinitis (weeds, grasses, indoor molds, outdoor molds, dust mites, cat and dog) - Continue with: Xyzal (levocetirizine) 5mg  tablet once daily and Singulair (montelukast) 10mg  daily - Continue with: Flonase (fluticasone) two sprays per nostril daily  - Continue to hold allergy shots.  - We can always restart them in the future if needed.   3. Reflux - Continue with Prilosec daily.  4. Return in about 1 year (around 09/18/2023). SAY HELLO TO [place] FOR ME!!!     Subjective:   Jodi [name] is a 46 y.o. female presenting today for follow up of  Chief Complaint  Patient presents with   Asthma   Allergic Rhinitis     Jodi [name]  has a history of the following: Patient Active Problem List   Diagnosis Date Noted   Seasonal and perennial allergic rhinoconjunctivitis 02/05/2020   Paradoxical vocal cord motion 02/05/2020   Moderate persistent asthma, uncomplicated 09/24/2017   Seasonal and perennial allergic rhinitis 09/24/2017   SI joint arthritis 02/21/2016   SI (sacroiliac) joint dysfunction 09/04/2015   Fall from horse 07/30/2015   Lumbar transverse process fracture (HCC) 07/30/2015   Pelvic ring fracture (HCC) 07/28/2015   Primary generalized (osteo)arthritis 03/13/2015   Asthma 11/17/2014   Gastroesophageal reflux disease 11/17/2014   Vitamin D deficiency 11/04/2014   Dyslipidemia 08/11/2012   Lung granuloma (HCC) 07/22/2011   Allergic rhinitis 02/19/2010    History obtained from: chart review and patient.  Jodi [name] is a 46 y.o. female presenting for a follow up visit. She was last seen in May 2023. At that time, we continued with montelukast 10mg  daily as well as Advair [name] two puffs BID during the season changes. For her rhinitis, we continued with levocetirizine as well as Singulair and Flonase. We decided to hold off on allergy shots. Reflux was controlled with Prilosec daily.   She has done well since the last visit. She has "too many" horses. She has 3 horses, many donkeys and she has horses in two other locations, including [place]. She apparently spends weeks down in [place] with her horses there and she loves the area.   Asthma/Respiratory Symptom History: She remains on the Advair only during season changes.  She had a little blip in her  control around one month ago. It was not too bad at all. It was likely weather related. Everyone was around there at the same time. She did not need prednisone at all. She did not have some coughing at night during her episode, but not typically. She has not been to the ED At all for her symptoms. She took her dog's prednisone. She was taking 20mg  and she tapered;    Allergic Rhinitis Symptom History: She takes the Singulair and the Xyzal daily. She only takes the fluticasone as needed.  She has not been on antibiotics at all. She feels fairly good despite not being on her shots and despite her having a number of environmental allergens. She has not been on antibiotics at all since the last visit.   GERD Symptom History: She remains on the Prilosec once daily.   Otherwise, there have been no changes to her past medical history, surgical history, family history, or social history.    Review of Systems  Constitutional: Negative.  Negative for chills, fever, malaise/fatigue and weight loss.  HENT:  Negative for congestion, ear discharge, ear pain and sinus pain.   Eyes:  Negative for pain, discharge and redness.  Respiratory:  Negative for cough, sputum production, shortness of breath and wheezing.   Cardiovascular: Negative.  Negative for chest pain and palpitations.  Gastrointestinal:  Negative for abdominal pain, constipation, diarrhea, heartburn, nausea and vomiting.  Skin: Negative.  Negative for itching and rash.  Neurological:  Negative for dizziness and headaches.  Endo/Heme/Allergies:  Negative for environmental allergies. Does not bruise/bleed easily.       Objective:   Blood pressure 130/80, pulse 91, temperature 97.7 F (36.5 C), resp. rate 16, height 5\' 6"  (1.676 m), weight 220 lb 4 oz (99.9 kg), last menstrual period 05/17/2012, SpO2 95 %. Body mass index is 35.55 kg/m.    Physical Exam Vitals reviewed.  Constitutional:      Appearance: She is well-developed.     Comments: Smiling.  HENT:     Head: Normocephalic and atraumatic.     Right Ear: Tympanic membrane, ear canal and external ear normal.     Left Ear: Tympanic membrane, ear canal and external ear normal.     Nose: No nasal deformity, septal deviation, mucosal edema or rhinorrhea.     Right Turbinates: Enlarged, swollen and pale.     Left Turbinates: Enlarged,  swollen and pale.     Right Sinus: No maxillary sinus tenderness or frontal sinus tenderness.     Left Sinus: No maxillary sinus tenderness or frontal sinus tenderness.     Comments: Minimal sinus tenderness.    Mouth/Throat:     Lips: Pink.     Mouth: Mucous membranes are moist. Mucous membranes are not pale and not dry.     Pharynx: Uvula midline.  Eyes:     General:        Right eye: No discharge.        Left eye: No discharge.     Conjunctiva/sclera: Conjunctivae normal.     Right eye: Right conjunctiva is not injected. No chemosis.    Left eye: Left conjunctiva is not injected. No chemosis.    Pupils: Pupils are equal, round, and reactive to light.  Cardiovascular:     Rate and Rhythm: Normal rate and regular rhythm.     Heart sounds: Normal heart sounds.  Pulmonary:     Effort: Pulmonary effort is normal. No tachypnea, accessory muscle usage or respiratory distress.  Breath sounds: Normal breath sounds. No wheezing, rhonchi or rales.     Comments: Moving air well in all lung fields.  No increased work of breathing. Chest:     Chest wall: No tenderness.  Lymphadenopathy:     Cervical: No cervical adenopathy.  Skin:    Coloration: Skin is not pale.     Findings: No abrasion, erythema, petechiae or rash. Rash is not papular, urticarial or vesicular.     Comments: No eczematous or urticarial lesions noted.  Neurological:     Mental Status: She is alert.  Psychiatric:        Behavior: Behavior is cooperative.      Diagnostic studies:    Spirometry: results normal (FEV1: 2.53/98%, FVC: 3.13/98%, FEV1/FVC: 81%).    Spirometry consistent with normal pattern.    Allergy Studies: none        Malachi Bonds, MD  [facility]

## 2022-09-17 NOTE — Patient Instructions (Addendum)
1. Moderate persistent asthma, uncomplicated - Lung testing looks awesome today. - We are not going to make any changes at this time.  - You have an excellent handle on your symptoms.  - Daily controller medication(s): Singulair 10mg  EVERY DAY and Advair 230/86mcg two puffs twice daily DURING SEASON CHANGES  - Prior to physical activity: albuterol 2 puffs 10-15 minutes before physical activity. - Rescue medications: albuterol 4 puffs every 4-6 hours as needed - Changes during respiratory infections or worsening symptoms: Increase Advair 230/48mcg to 4 puffs twice daily for TWO WEEKS. - Asthma control goals:  * Full participation in all desired activities (may need albuterol before activity) * Albuterol use two time or less a week on average (not counting use with activity) * Cough interfering with sleep two time or less a month * Oral steroids no more than once a year * No hospitalizations  2. Chronic rhinitis (weeds, grasses, indoor molds, outdoor molds, dust mites, cat and dog) - Continue with: Xyzal (levocetirizine) 5mg  tablet once daily and Singulair (montelukast) 10mg  daily - Continue with: Flonase (fluticasone) two sprays per nostril daily  - Continue to hold allergy shots.  - We can always restart them in the future if needed.   3. Reflux - Continue with Prilosec daily.  4. Return in about 1 year (around 09/18/2023). SAY HELLO TO [place] FOR ME!!!   Please inform of any Emergency Department visits, hospitalizations, or changes in symptoms. Call before going to the ED for breathing or allergy symptoms since we might be able to fit you in for a sick visit. Feel free to contact 14/03/2023 anytime with any questions, problems, or concerns.  It was a pleasure to see you again today!  Websites that have reliable patient information: 1. American Academy of Asthma, Allergy, and Immunology: www.aaaai.org 2. Food Allergy Research and Education (FARE): foodallergy.org 3. Mothers of  Asthmatics: http://www.asthmacommunitynetwork.org 4. American College of Allergy, Asthma, and Immunology: www.acaai.org   COVID-19 Vaccine Information can be found at: [place] For questions related to vaccine distribution or appointments, please email [email or web address] or call [phone].     "Like" [place] on Facebook and Instagram for our latest updates!     Make sure you are registered to vote! If you have moved or changed any of your contact information, you will need to get this updated before voting!  In some cases, you MAY be able to register to vote online: [email or web address]

## 2022-09-21 ENCOUNTER — Encounter: Payer: Self-pay | Admitting: Allergy & Immunology

## 2022-12-04 ENCOUNTER — Other Ambulatory Visit: Payer: Self-pay | Admitting: Allergy & Immunology

## 2022-12-04 NOTE — Telephone Encounter (Signed)
I called patient and informed refill is being sent to [facility] in [place].

## 2023-02-18 ENCOUNTER — Other Ambulatory Visit: Payer: Self-pay | Admitting: Allergy & Immunology

## 2023-06-09 ENCOUNTER — Other Ambulatory Visit (HOSPITAL_COMMUNITY): Payer: Self-pay | Admitting: Family Medicine

## 2023-06-09 DIAGNOSIS — R600 Localized edema: Secondary | ICD-10-CM

## 2023-06-12 ENCOUNTER — Ambulatory Visit (HOSPITAL_COMMUNITY)
Admission: RE | Admit: 2023-06-12 | Discharge: 2023-06-12 | Disposition: A | Discharge status: Home / Self Care | Payer: Medicare Other | Source: Ambulatory Visit | Attending: Family Medicine | Admitting: Family Medicine

## 2023-06-12 DIAGNOSIS — R600 Localized edema: Secondary | ICD-10-CM | POA: Diagnosis present

## 2023-08-18 ENCOUNTER — Other Ambulatory Visit: Payer: Self-pay | Admitting: Allergy & Immunology

## 2023-09-18 ENCOUNTER — Encounter: Payer: Self-pay | Admitting: Allergy & Immunology

## 2023-09-18 ENCOUNTER — Ambulatory Visit (INDEPENDENT_AMBULATORY_CARE_PROVIDER_SITE_OTHER): Payer: 59 | Admitting: Allergy & Immunology

## 2023-09-18 VITALS — BP 122/84 | HR 86 | Temp 98.3°F | Resp 18 | Ht 64.57 in | Wt 198.4 lb

## 2023-09-18 DIAGNOSIS — J302 Other seasonal allergic rhinitis: Secondary | ICD-10-CM

## 2023-09-18 DIAGNOSIS — J4540 Moderate persistent asthma, uncomplicated: Secondary | ICD-10-CM | POA: Diagnosis not present

## 2023-09-18 DIAGNOSIS — B999 Unspecified infectious disease: Secondary | ICD-10-CM

## 2023-09-18 DIAGNOSIS — J383 Other diseases of vocal cords: Secondary | ICD-10-CM

## 2023-09-18 DIAGNOSIS — J3089 Other allergic rhinitis: Secondary | ICD-10-CM | POA: Diagnosis not present

## 2023-09-18 MED ORDER — FLUTICASONE-SALMETEROL 115-21 MCG/ACT IN AERO: 2.0000 | INHALATION_SPRAY | Freq: Two times a day (BID) | RESPIRATORY_TRACT | 5 refills | Status: DC

## 2023-09-18 MED ORDER — MONTELUKAST SODIUM 10 MG PO TABS: 10.0000 mg | ORAL_TABLET | Freq: Every morning | ORAL | 0 refills | Status: DC

## 2023-09-18 MED ORDER — LEVOCETIRIZINE DIHYDROCHLORIDE 5 MG PO TABS: ORAL_TABLET | ORAL | 3 refills | Status: DC

## 2023-09-18 NOTE — Progress Notes (Signed)
FOLLOW UP  Date of Service/Encounter:  09/18/23   Assessment:   Moderate persistent asthma - likely with VCD component    Seasonal and perennial allergic rhinitis (weeds, grasses, indoor molds, outdoor molds, dust mites, cat and dog)   Chronic back pain - on disability from [place] and [place] at this time  Plan/Recommendations:   1. Moderate persistent asthma, uncomplicated - Lung testing looks awesome today. - We are not going to make any changes at this time.  - You have an excellent handle on your symptoms.  - Consider starting a biologic for avoidance of future prednisone courses ([facility] handout provided). - We will see how you do in the next six months.  - Daily controller medication(s): Singulair 10mg  EVERY DAY and Advair 115/3mcg two puffs twice daily DURING SEASON CHANGES  - Prior to physical activity: albuterol 2 puffs 10-15 minutes before physical activity. - Rescue medications: albuterol 4 puffs every 4-6 hours as needed - Changes during respiratory infections or worsening symptoms: Increase Advair 115/22mcg to 4 puffs twice daily for TWO WEEKS. - Asthma control goals:  * Full participation in all desired activities (may need albuterol before activity) * Albuterol use two time or less a week on average (not counting use with activity) * Cough interfering with sleep two time or less a month * Oral steroids no more than once a year * No hospitalizations  2. Chronic rhinitis (weeds, grasses, indoor molds, outdoor molds, dust mites, cat and dog) - Continue with: Xyzal (levocetirizine) 5mg  tablet once daily and Singulair (montelukast) 10mg  daily - Continue with: Flonase (fluticasone) two sprays per nostril daily   3. Reflux - Continue with Prilosec daily.  4. Recurrent infections - We will obtain some screening labs to evaluate your immune system.  - Your recent complete blood count was NORMAL, which is great.  - Labs to evaluate the quantitative [facility])  aspects of your immune system: IgG/IgA/IgM - Labs to evaluate the qualitative (HOW WELL THEY WORK) aspects of your immune system: CH50, Pneumococcal titers, Tetanus titers, Diphtheria titers  5. Return in about 6 months (around 03/18/2024). You can have the follow up appointment with Dr. Dellis Anes or a Nurse Practicioner (our Nurse Practitioners are excellent and always have Physician oversight!).   Subjective:   Jodi [name] is a 47 y.o. female presenting today for follow up of  Chief Complaint  Patient presents with   Follow-up    With the weather change she did have to use her albuterol inhaler a few times.     Jodi [name] has a history of the following: Patient Active Problem List   Diagnosis Date Noted   Seasonal and perennial allergic rhinoconjunctivitis 02/05/2020   Paradoxical vocal cord motion 02/05/2020   Moderate persistent asthma, uncomplicated 09/24/2017   Seasonal and perennial allergic rhinitis 09/24/2017   SI joint arthritis (HCC) 02/21/2016   SI (sacroiliac) joint dysfunction 09/04/2015   Fall from horse 07/30/2015   Lumbar transverse process fracture (HCC) 07/30/2015   Pelvic ring fracture (HCC) 07/28/2015   Primary generalized (osteo)arthritis 03/13/2015   Asthma 11/17/2014   Gastroesophageal reflux disease 11/17/2014   Vitamin D deficiency 11/04/2014   Dyslipidemia 08/11/2012   Lung granuloma (HCC) 07/22/2011   Allergic rhinitis 02/19/2010    History obtained from: chart review and patient.  Discussed the use of AI scribe software for clinical note transcription with the patient and/or guardian, who gave verbal consent to proceed.  Jodi [name] is a 46 y.o. female presenting for a  follow up visit.  She was last seen in December 2023.  At that time, like testing looked awesome.  Medication changes.  Will continue with singular 10 mg daily as well as Advair 230/21 mcg 2 puffs twice daily during flares.  For her rhinitis, we continued with Xyzal as well as  Singulair and Flonase.  Since the last visit, she has mostly done well. She does report persistent back pain. The patient reports that her back pain is severe enough to limit her driving and overall mobility. She is currently managing her pain with oxycodone. She is going to have a stimulator unit placed next week. This has been an ongoing issue for her.   Asthma/Respiratory Symptom History: She remains on the Advair two puffs BID during particular times of the year. She has been needing more prednisone this year compared to last year. She estimates that she has needed prednisone 3-4 times this year. She is not excited about starting any asthma biologic. She has been on Dupixent in the past and did feel that it helped. But her severity decreased since that time.   Allergic Rhinitis Symptom History: The patient's allergies have been notably worse this year, requiring multiple courses of prednisone and antibiotics. She reports that the prednisone effectively clears up her symptoms, but she is aware of the potential side effects. She is currently on montelukast and levocetirizine for her allergies, and she has not needed prednisone for several months.  Her heartburn is well-controlled with regular use of omeprazole. If she stops taking the medication, she reports that her heartburn returns within a week.  GERD Symptom History: Her heartburn is well-controlled with regular use of omeprazole. If she stops taking the medication, she reports that her heartburn returns within a week.  Otherwise, there have been no changes to her past medical history, surgical history, family history, or social history.    Review of systems otherwise negative other than that mentioned in the HPI.    Objective:   Blood pressure 122/84, pulse 86, temperature 98.3 F (36.8 C), resp. rate 18, height 5' 4.57" (1.64 m), weight 198 lb 6 oz (90 kg), last menstrual period 05/17/2012, SpO2 96%. Body mass index is 33.46  kg/m.    Physical Exam Vitals reviewed.  Constitutional:      General: She is not in acute distress.    Appearance: She is well-developed.     Comments: Smiling. Seems rather zen today.   HENT:     Head: Normocephalic and atraumatic.     Right Ear: Tympanic membrane, ear canal and external ear normal.     Left Ear: Tympanic membrane, ear canal and external ear normal.     Nose: No nasal deformity, septal deviation, mucosal edema or rhinorrhea.     Right Turbinates: Enlarged, swollen and pale.     Left Turbinates: Enlarged, swollen and pale.     Right Sinus: No maxillary sinus tenderness or frontal sinus tenderness.     Left Sinus: No maxillary sinus tenderness or frontal sinus tenderness.     Comments: No nasal polyps.    Mouth/Throat:     Lips: Pink.     Mouth: Mucous membranes are moist. Mucous membranes are not pale and not dry.     Pharynx: Uvula midline.  Eyes:     General:        Right eye: No discharge.        Left eye: No discharge.     Conjunctiva/sclera: Conjunctivae normal.  Right eye: Right conjunctiva is not injected. No chemosis.    Left eye: Left conjunctiva is not injected. No chemosis.    Pupils: Pupils are equal, round, and reactive to light.  Cardiovascular:     Rate and Rhythm: Normal rate and regular rhythm.     Heart sounds: Normal heart sounds.  Pulmonary:     Effort: Pulmonary effort is normal. No tachypnea, accessory muscle usage or respiratory distress.     Breath sounds: Normal breath sounds. No wheezing, rhonchi or rales.     Comments: Moving air well in all lung fields.  No increased work of breathing. Chest:     Chest wall: No tenderness.  Lymphadenopathy:     Cervical: No cervical adenopathy.  Skin:    Coloration: Skin is not pale.     Findings: No abrasion, erythema, petechiae or rash. Rash is not papular, urticarial or vesicular.     Comments: No eczematous or urticarial lesions noted.  Neurological:     Mental Status: She is alert.   Psychiatric:        Behavior: Behavior is cooperative.      Diagnostic studies:    Spirometry: results normal (FEV1: 2.79/116%, FVC: 3.52/119%, FEV1/FVC: 79%).    Spirometry consistent with normal pattern.   Allergy Studies: none        Malachi Bonds, MD  [facility]

## 2023-09-18 NOTE — Patient Instructions (Addendum)
1. Moderate persistent asthma, uncomplicated - Lung testing looks awesome today. - We are not going to make any changes at this time.  - You have an excellent handle on your symptoms.  - Consider starting a biologic for avoidance of future prednisone courses ([facility] handout provided). - We will see how you do in the next six months.  - Daily controller medication(s): Singulair 10mg  EVERY DAY and Advair 115/61mcg two puffs twice daily DURING SEASON CHANGES  - Prior to physical activity: albuterol 2 puffs 10-15 minutes before physical activity. - Rescue medications: albuterol 4 puffs every 4-6 hours as needed - Changes during respiratory infections or worsening symptoms: Increase Advair 115/61mcg to 4 puffs twice daily for TWO WEEKS. - Asthma control goals:  * Full participation in all desired activities (may need albuterol before activity) * Albuterol use two time or less a week on average (not counting use with activity) * Cough interfering with sleep two time or less a month * Oral steroids no more than once a year * No hospitalizations  2. Chronic rhinitis (weeds, grasses, indoor molds, outdoor molds, dust mites, cat and dog) - Continue with: Xyzal (levocetirizine) 5mg  tablet once daily and Singulair (montelukast) 10mg  daily - Continue with: Flonase (fluticasone) two sprays per nostril daily   3. Reflux - Continue with Prilosec daily.  4. Recurrent infections - We will obtain some screening labs to evaluate your immune system.  - Your recent complete blood count was NORMAL, which is great.  - Labs to evaluate the quantitative [facility]) aspects of your immune system: IgG/IgA/IgM - Labs to evaluate the qualitative (HOW WELL THEY WORK) aspects of your immune system: CH50, Pneumococcal titers, Tetanus titers, Diphtheria titers  5. Return in about 6 months (around 03/18/2024). You can have the follow up appointment with Dr. Dellis Anes or a Nurse Practicioner (our Nurse Practitioners are  excellent and always have Physician oversight!).    Please inform us of any Emergency Department visits, hospitalizations, or changes in symptoms. Call us before going to the ED for breathing or allergy symptoms since we might be able to fit you in for a sick visit. Feel free to contact us anytime with any questions, problems, or concerns.  It was a pleasure to see you again today!  Websites that have reliable patient information: 1. American Academy of Asthma, Allergy, and Immunology: www.aaaai.org 2. Food Allergy Research and Education (FARE): foodallergy.org 3. Mothers of Asthmatics: http://www.asthmacommunitynetwork.org 4. American College of Allergy, Asthma, and Immunology: www.acaai.org      "Like" [place] on Facebook and Instagram for our latest updates!      A healthy democracy works best when [employer] participate! Make sure you are registered to vote! If you have moved or changed any of your contact information, you will need to get this updated before voting! Scan the QR codes below to learn more!

## 2023-09-23 NOTE — Addendum Note (Signed)
Addended by: Elsworth Soho on: 09/23/2023 05:37 PM   Modules accepted: Orders

## 2023-09-25 ENCOUNTER — Other Ambulatory Visit (HOSPITAL_COMMUNITY): Payer: Self-pay | Admitting: Unknown Physician Specialty

## 2023-09-25 DIAGNOSIS — Z1231 Encounter for screening mammogram for malignant neoplasm of breast: Secondary | ICD-10-CM

## 2023-09-30 ENCOUNTER — Ambulatory Visit (HOSPITAL_COMMUNITY)
Admission: RE | Admit: 2023-09-30 | Discharge: 2023-09-30 | Disposition: A | Discharge status: Home / Self Care | Payer: Medicare Other | Source: Ambulatory Visit | Attending: Unknown Physician Specialty | Admitting: Unknown Physician Specialty

## 2023-09-30 DIAGNOSIS — Z1231 Encounter for screening mammogram for malignant neoplasm of breast: Secondary | ICD-10-CM | POA: Insufficient documentation

## 2023-10-02 ENCOUNTER — Other Ambulatory Visit: Payer: Self-pay

## 2023-10-02 ENCOUNTER — Encounter (HOSPITAL_COMMUNITY): Payer: Self-pay

## 2023-10-02 ENCOUNTER — Emergency Department (HOSPITAL_COMMUNITY): Payer: 59

## 2023-10-02 ENCOUNTER — Emergency Department (HOSPITAL_COMMUNITY)
Admission: EM | Admit: 2023-10-02 | Discharge: 2023-10-02 | Disposition: A | Discharge status: Home / Self Care | Payer: 59 | Attending: Emergency Medicine | Admitting: Emergency Medicine

## 2023-10-02 DIAGNOSIS — M25551 Pain in right hip: Secondary | ICD-10-CM | POA: Diagnosis present

## 2023-10-02 DIAGNOSIS — M5431 Sciatica, right side: Secondary | ICD-10-CM | POA: Diagnosis not present

## 2023-10-02 DIAGNOSIS — Z7951 Long term (current) use of inhaled steroids: Secondary | ICD-10-CM | POA: Insufficient documentation

## 2023-10-02 DIAGNOSIS — J45909 Unspecified asthma, uncomplicated: Secondary | ICD-10-CM | POA: Insufficient documentation

## 2023-10-02 MED ORDER — ONDANSETRON 4 MG PO TBDP
4.0000 mg | ORAL_TABLET | Freq: Once | ORAL | Status: AC
  Administered 2023-10-02: 4 mg via ORAL
  Filled 2023-10-02: qty 1

## 2023-10-02 MED ORDER — HYDROMORPHONE HCL 1 MG/ML IJ SOLN
1.0000 mg | Freq: Once | INTRAMUSCULAR | Status: AC
  Administered 2023-10-02: 1 mg via INTRAMUSCULAR
  Filled 2023-10-02: qty 1

## 2023-10-02 MED ORDER — KETOROLAC TROMETHAMINE 30 MG/ML IJ SOLN
30.0000 mg | Freq: Once | INTRAMUSCULAR | Status: AC
  Administered 2023-10-02: 30 mg via INTRAMUSCULAR
  Filled 2023-10-02: qty 1

## 2023-10-02 NOTE — Discharge Instructions (Signed)
As discussed your x-rays today are negative for any obvious source of your new pain.  I do recommend application of a heating pad to your hip area 20 minutes several times daily.  You may also want to consider adding arthritis strength Tylenol 650 mg per label instructions, this medication will not interfere with your other medications and can help to improve your pain symptoms.

## 2023-10-02 NOTE — ED Notes (Signed)
Pt in XRAY 

## 2023-10-02 NOTE — ED Triage Notes (Signed)
Receives injection for sciatic nerve pain Plates and screws in hip from prior injury  RIGHT leg burning pain Medications at home are not helping

## 2023-10-04 NOTE — ED Provider Notes (Signed)
EMERGENCY DEPARTMENT AT [facility] Provider Note   CSN: [id number] Arrival date & time: 10/02/23  1420     History  Chief Complaint  Patient presents with   Back Pain    Jodi [name] is a 47 y.o. female with a past medical history significant for chronic pain syndrome, hyperlipidemia, asthma, arthritis, under the care of chronic pain management since she had significant back and pelvis injuries sustained when she fell from a horse several years ago.  She has chronic sciatica which intermittently flares causing severe pain.  She denies any new injuries or fall, she typically has sciatica in her left leg, however she presents today for sciatica in her right leg which is unusual.  She denies weakness or numbness in the leg but endorses pain that radiates down her right lateral leg to her foot.  She has had no urinary or fecal incontinence or retention.  She also has pain in her right hip joint both laterally and in her right groin which is worse with standing which is a new pain for her.  She is on multiple medications for her pain including Celebrex, oxycodone which she tries to use sparingly and Zanaflex.  She also goes in for routine steroid injections of her spine.  She denies fevers or chills, no other complaints.  The history is provided by the patient.       Home Medications Prior to Admission medications   Medication Sig Start Date End Date Taking? Authorizing Provider  albuterol (VENTOLIN HFA) 108 (90 Base) MCG/ACT inhaler Inhale 2 puffs into the lungs every 4 (four) hours as needed for wheezing or shortness of breath. 03/13/22   Alfonse Spruce, MD  ascorbic acid (VITAMIN C) 500 MG tablet Take by mouth. 09/23/19   [provider]  celecoxib (CELEBREX) 200 MG capsule Take 200 mg by mouth daily. 07/09/21   [provider]  fluticasone Aleda Grana) 50 MCG/ACT nasal spray Use 2 spray(s) in each nostril once daily 07/20/19   Alfonse Spruce, MD  fluticasone-salmeterol (ADVAIR [facility]) 5801670189 MCG/ACT inhaler Inhale 2 puffs into the lungs 2 (two) times daily. 09/18/23   Alfonse Spruce, MD  ipratropium (ATROVENT) 0.02 % nebulizer solution Take 2.5 mLs (0.5 mg total) by nebulization every 4 (four) hours as needed for wheezing or shortness of breath. 01/04/20   Alfonse Spruce, MD  ipratropium-albuterol (DUONEB) 0.5-2.5 (3) MG/3ML SOLN Take 3 mLs by nebulization every 6 (six) hours as needed (shortness of breath).     [provider]  levocetirizine (XYZAL) 5 MG tablet TAKE 1 TABLET BY MOUTH ONCE DAILY AS NEEDED FOR ALLERGIES 09/18/23   Alfonse Spruce, MD  montelukast (SINGULAIR) 10 MG tablet Take 1 tablet (10 mg total) by mouth every morning. 09/18/23   Alfonse Spruce, MD  omeprazole (PRILOSEC) 20 MG capsule Take 1 capsule (20 mg total) by mouth daily. 09/11/21 09/18/23  Alfonse Spruce, MD  oxyCODONE (OXY IR/ROXICODONE) 5 MG immediate release tablet Take by mouth. 01/13/23   [provider]  POTASSIUM PO Take by mouth.    [provider]  pregabalin (LYRICA) 50 MG capsule Take 100 mg by mouth at bedtime. 06/19/21   [provider]  rosuvastatin (CRESTOR) 20 MG tablet Take 20 mg by mouth daily. 07/15/22   [provider]  tiZANidine (ZANAFLEX) 4 MG tablet Take 4 mg by mouth 3 (three) times daily. 08/02/20   [provider]  triamterene-hydrochlorothiazide (MAXZIDE-25) 37.5-25 MG  tablet Take 1 tablet by mouth daily. 01/07/22   [provider]      Allergies    Prochlorperazine    Review of Systems   Review of Systems  Constitutional:  Negative for fever.  Musculoskeletal:  Positive for arthralgias and back pain. Negative for joint swelling and myalgias.  Neurological:  Negative for weakness and numbness.  All other systems reviewed and are negative.   Physical Exam Updated Vital Signs BP 132/74   Pulse 61   Temp 98.6 F (37 C)   Resp 18    Ht 5\' 6"  (1.676 m)   Wt 90.3 kg   LMP 05/17/2012   SpO2 97%   BMI 32.12 kg/m  Physical Exam Vitals and nursing note reviewed.  Constitutional:      Appearance: She is well-developed.  HENT:     Head: Normocephalic.  Eyes:     Conjunctiva/sclera: Conjunctivae normal.  Cardiovascular:     Rate and Rhythm: Normal rate.     Pulses: Normal pulses.     Comments: Pedal pulses normal. Pulmonary:     Effort: Pulmonary effort is normal.  Abdominal:     General: Bowel sounds are normal. There is no distension.     Palpations: Abdomen is soft. There is no mass.  Musculoskeletal:        General: Normal range of motion.     Cervical back: Normal range of motion and neck supple.     Lumbar back: Tenderness present. No swelling, edema or spasms.     Right hip: Bony tenderness present.       Legs:  Skin:    General: Skin is warm and dry.  Neurological:     Mental Status: She is alert.     Sensory: No sensory deficit.     Motor: No tremor or atrophy.     Gait: Gait normal.     Deep Tendon Reflexes:     Reflex Scores:      Achilles reflexes are 2+ on the right side and 2+ on the left side.    Comments: No strength deficit noted in hip and knee flexor and extensor muscle groups.  Ankle flexion and extension intact.     ED Results / Procedures / Treatments   Labs (all labs ordered are listed, but only abnormal results are displayed) Labs Reviewed - No data to display  EKG None  Radiology DG Hip Unilat W or Wo Pelvis 2-3 Views Right Result Date: 10/02/2023 CLINICAL DATA:  Pelvic pain. EXAM: DG HIP (WITH OR WITHOUT PELVIS) 2-3V RIGHT COMPARISON:  Pelvic radiograph dated 08/06/2021. FINDINGS: No acute fracture or dislocation. Fixation hardware of the sacrum and bilateral SI joint as well as symphysis pubis. The bones are well mineralized. Mild bilateral hip arthritic changes. The soft tissues are unremarkable. IMPRESSION: 1. No acute fracture or dislocation. 2. Mild bilateral hip  arthritic changes. Electronically Signed   By: Elgie Collard M.D.   On: 10/02/2023 20:23    Procedures Procedures    Medications Ordered in ED Medications  ketorolac (TORADOL) 30 MG/ML injection 30 mg (30 mg Intramuscular Given 10/02/23 1501)  HYDROmorphone (DILAUDID) injection 1 mg (1 mg Intramuscular Given 10/02/23 1908)  ondansetron (ZOFRAN-ODT) disintegrating tablet 4 mg (4 mg Oral Given 10/02/23 1910)    ED Course/ Medical Decision Making/ A&P  Medical Decision Making Patient presenting with acute on chronic pain, right sciatica distribution but also right hip pain which is new.  Imaging was obtained with no obvious new source of her pain symptoms.  She is neurovascularly intact.  She was given an IM injection of Dilaudid after which she had significant improvement in her pain.  She states at baseline she has daily pain at the 4-5 level, which she is currently reporting so is satisfied with current pain level.  Advised close f/u with her pcp and/or her chronic pain specialist  Amount and/or Complexity of Data Reviewed Radiology: ordered.    Details: Imaging reviewed, no acute fracture or dislocation.           Final Clinical Impression(s) / ED Diagnoses Final diagnoses:  Sciatica of right side  Pain of right hip    Rx / DC Orders ED Discharge Orders     None         Victoriano Lain 10/04/23 2304    Royanne Foots, DO 10/05/23 [phone]

## 2023-12-17 ENCOUNTER — Encounter: Payer: Self-pay | Admitting: Emergency Medicine

## 2023-12-17 ENCOUNTER — Other Ambulatory Visit: Payer: Self-pay

## 2023-12-17 ENCOUNTER — Ambulatory Visit
Admission: EM | Admit: 2023-12-17 | Discharge: 2023-12-17 | Disposition: A | Discharge status: Home / Self Care | Attending: Nurse Practitioner | Admitting: Nurse Practitioner

## 2023-12-17 DIAGNOSIS — R0602 Shortness of breath: Secondary | ICD-10-CM

## 2023-12-17 DIAGNOSIS — B349 Viral infection, unspecified: Secondary | ICD-10-CM

## 2023-12-17 DIAGNOSIS — Z8709 Personal history of other diseases of the respiratory system: Secondary | ICD-10-CM

## 2023-12-17 LAB — POC COVID19/FLU A&B COMBO
Covid Antigen, POC: NEGATIVE
Influenza A Antigen, POC: NEGATIVE
Influenza B Antigen, POC: NEGATIVE

## 2023-12-17 MED ORDER — PREDNISONE 20 MG PO TABS: 40.0000 mg | ORAL_TABLET | Freq: Every day | ORAL | 0 refills | Status: AC

## 2023-12-17 MED ORDER — ONDANSETRON 4 MG PO TBDP: 4.0000 mg | ORAL_TABLET | Freq: Three times a day (TID) | ORAL | 0 refills | Status: AC | PRN

## 2023-12-17 NOTE — ED Triage Notes (Addendum)
 Pt reports generalized body aches,nausea, diarrhea since thursday.

## 2023-12-17 NOTE — Discharge Instructions (Addendum)
 COVID/flu test was negative. Take medication as prescribed.  Continue your current allergy regimen. May take over-the-counter Tylenol or ibuprofen as needed for pain, fever, or general discomfort. Recommend a brat diet until nausea improves this includes bananas, rice, applesauce, and toast.  Also recommend Pedialyte or Gatorade to help prevent dehydration. Monitor your asthma for worsening.  If you experience wheezing, shortness of breath, or become unable to speak in a complete sentence, please go to the emergency department immediately. Symptoms should improve over the next 5 to 7 days.  If symptoms fail to improve, please follow-up with your primary care physician for further evaluation. Follow-up as needed.

## 2023-12-17 NOTE — ED Provider Notes (Signed)
 [facility]    CSN: [id number] Arrival date & time: 12/17/23  4098      History   Chief Complaint Chief Complaint  Patient presents with   Generalized Body Aches    HPI Jodi [name] is a 48 y.o. female.   The history is provided by the patient.   Patient presents for complaints of nausea, generalized fatigue, body aches, shortness of breath, and diarrhea.  Symptoms have been present for the past week.  She denies fever, chills, headache, nasal congestion, runny nose, cough, abdominal pain, vomiting, or rash.  She reports that she has had diarrhea, but has not been "that much."  States that she does have underlying history of asthma, states that she uses Advair as needed during seasonal changes, and also has an albuterol inhaler.  States she has not been using the Advair.  She also reports decreased appetite.  Past Medical History:  Diagnosis Date   Abdominal pain, chronic, right upper quadrant    Acute recurrent maxillary sinusitis 11/04/2014   Allergic rhinitis    Anemia    Angio-edema    Arthritis    knee   Asthma    Cough variant   Asthma    Asthma    Back pain    Chronic pain syndrome 06/08/2013   DVT (deep venous thrombosis) (HCC) 10 yrs ago   Dyslipidemia 08/11/2012   Eczema    Fractures    GERD (gastroesophageal reflux disease)    Heart murmur    as a child   Hyperlipidemia    IBS (irritable bowel syndrome)    Lung granuloma (HCC) 07/22/2011   4mm per CT chest   Pneumonia    Shortness of breath    SOB even without exertion at times   Sickle-cell trait [facility])     Patient Active Problem List   Diagnosis Date Noted   Seasonal and perennial allergic rhinoconjunctivitis 02/05/2020   Paradoxical vocal cord motion 02/05/2020   Moderate persistent asthma, uncomplicated 09/24/2017   Seasonal and perennial allergic rhinitis 09/24/2017   SI joint arthritis (HCC) 02/21/2016   SI (sacroiliac) joint dysfunction 09/04/2015   Fall from horse  07/30/2015   Lumbar transverse process fracture (HCC) 07/30/2015   Pelvic ring fracture (HCC) 07/28/2015   Primary generalized (osteo)arthritis 03/13/2015   Asthma 11/17/2014   Gastroesophageal reflux disease 11/17/2014   Vitamin D deficiency 11/04/2014   Dyslipidemia 08/11/2012   Lung granuloma (HCC) 07/22/2011   Allergic rhinitis 02/19/2010    Past Surgical History:  Procedure Laterality Date   ABDOMINAL HYSTERECTOMY  4-5 yrs ago   ABDOMINAL HYSTERECTOMY     BACK SURGERY     nerve block test x1 february 2025   blood clot removed from lower abdomen  2000   CHOLECYSTECTOMY  11/12/2012   Procedure: LAPAROSCOPIC CHOLECYSTECTOMY;  Surgeon: Dalia Heading, MD;  Location: [facility];  Service: General;  Laterality: N/A;   CHOLECYSTECTOMY     COLONOSCOPY  11/04/2012   Procedure: COLONOSCOPY;  Surgeon: Malissa Hippo, MD;  Location: [facility];  Service: Endoscopy;  Laterality: N/A;  915   cyst removed from ovary and fluid removed from tubes  2000   ESOPHAGOGASTRODUODENOSCOPY  10/22/2012   Procedure: ESOPHAGOGASTRODUODENOSCOPY (EGD);  Surgeon: Malissa Hippo, MD;  Location: [facility];  Service: Endoscopy;  Laterality: N/A;  100   FRACTURE SURGERY     HARDWARE REMOVAL Left 02/21/2016   Procedure: HARDWARE REMOVAL LEFT SACROILIAC JOINT;  Surgeon: Myrene Galas, MD;  Location: [facility] OR;  Service: Orthopedics;  Laterality: Left;   ORIF PELVIC FRACTURE N/A 07/30/2015   Procedure: OPEN REDUCTION INTERNAL FIXATION (ORIF) PELVIC FRACTURE;  Surgeon: Myrene Galas, MD;  Location: [facility] OR;  Service: Orthopedics;  Laterality: N/A;   PARTIAL HYSTERECTOMY  2009   SACRO-ILIAC PINNING Right 07/30/2015   Procedure: Loyal Gambler;  Surgeon: Myrene Galas, MD;  Location: [facility] OR;  Service: Orthopedics;  Laterality: Right;   SACRO-ILIAC PINNING Left 09/04/2015   Procedure: LEFT TO RIGHT TRANS SACRO-ILIAC SCREW;  Surgeon: Myrene Galas, MD;  Location: [facility] OR;  Service: Orthopedics;  Laterality: Left;    SACROILIAC JOINT FUSION Left 02/21/2016   Procedure: LEFT SACROILIAC JOINT FUSION;  Surgeon: Myrene Galas, MD;  Location: [facility] OR;  Service: Orthopedics;  Laterality: Left;   TUBAL LIGATION  1999    OB History     Gravida  0   Para  0   Term  0   Preterm  0   AB  0   Living         SAB  0   IAB  0   Ectopic  0   Multiple      Live Births               Home Medications    Prior to Admission medications   Medication Sig Start Date End Date Taking? Authorizing Provider  albuterol (VENTOLIN HFA) 108 (90 Base) MCG/ACT inhaler Inhale 2 puffs into the lungs every 4 (four) hours as needed for wheezing or shortness of breath. 03/13/22   Alfonse Spruce, MD  ascorbic acid (VITAMIN C) 500 MG tablet Take by mouth. 09/23/19   [provider]  celecoxib (CELEBREX) 200 MG capsule Take 200 mg by mouth daily. 07/09/21   [provider]  fluticasone Aleda Grana) 50 MCG/ACT nasal spray Use 2 spray(s) in each nostril once daily 07/20/19   Alfonse Spruce, MD  fluticasone-salmeterol (ADVAIR [facility]) [id number] MCG/ACT inhaler Inhale 2 puffs into the lungs 2 (two) times daily. 09/18/23   Alfonse Spruce, MD  ipratropium (ATROVENT) 0.02 % nebulizer solution Take 2.5 mLs (0.5 mg total) by nebulization every 4 (four) hours as needed for wheezing or shortness of breath. 01/04/20   Alfonse Spruce, MD  ipratropium-albuterol (DUONEB) 0.5-2.5 (3) MG/3ML SOLN Take 3 mLs by nebulization every 6 (six) hours as needed (shortness of breath).     [provider]  levocetirizine (XYZAL) 5 MG tablet TAKE 1 TABLET BY MOUTH ONCE DAILY AS NEEDED FOR ALLERGIES 09/18/23   Alfonse Spruce, MD  montelukast (SINGULAIR) 10 MG tablet Take 1 tablet (10 mg total) by mouth every morning. 09/18/23   Alfonse Spruce, MD  omeprazole (PRILOSEC) 20 MG capsule Take 1 capsule (20 mg total) by mouth daily. 09/11/21 09/18/23  Alfonse Spruce, MD  oxyCODONE (OXY IR/ROXICODONE)  5 MG immediate release tablet Take by mouth. 01/13/23   [provider]  POTASSIUM PO Take by mouth.    [provider]  pregabalin (LYRICA) 50 MG capsule Take 100 mg by mouth at bedtime. 06/19/21   [provider]  rosuvastatin (CRESTOR) 20 MG tablet Take 20 mg by mouth daily. 07/15/22   [provider]  tiZANidine (ZANAFLEX) 4 MG tablet Take 4 mg by mouth 3 (three) times daily. 08/02/20   [provider]  triamterene-hydrochlorothiazide (MAXZIDE-25) 37.5-25 MG tablet Take 1 tablet by mouth daily. 01/07/22   [provider]    Family History Family History  Problem Relation Age of Onset   Asthma Mother    Hypertension Mother    Bronchiolitis Mother    Stroke Mother    Hypertension Father    Hyperlipidemia Father        recurrent rectum polyps    Allergic rhinitis Father    Diabetes Sister    Lupus Sister    Depression Sister    Angioedema Neg Hx    Atopy Neg Hx    Eczema Neg Hx    Immunodeficiency Neg Hx    Urticaria Neg Hx     Social History Social History   Tobacco Use   Smoking status: Former    Current packs/day: 0.00    Average packs/day: 0.3 packs/day for 10.0 years (2.5 ttl pk-yrs)    Types: Cigarettes    Start date: 10/13/1998    Quit date: 10/13/2008    Years since quitting: 15.1   Smokeless tobacco: Never  Vaping Use   Vaping status: Never Used  Substance Use Topics   Alcohol use: No   Drug use: No     Allergies   Prochlorperazine   Review of Systems Review of Systems Per HPI  Physical Exam Triage Vital Signs ED Triage Vitals [12/17/23 1153]  Encounter Vitals Group     BP (!) 147/87     Systolic BP Percentile      Diastolic BP Percentile      Pulse Rate 78     Resp 20     Temp 98.3 F (36.8 C)     Temp Source Oral     SpO2 96 %     Weight      Height      Head Circumference      Peak Flow      Pain Score 7     Pain Loc      Pain Education      Exclude from Growth Chart    No data  found.  Updated Vital Signs BP (!) 147/87 (BP Location: Right Arm)   Pulse 78   Temp 98.3 F (36.8 C) (Oral)   Resp 20   LMP 05/17/2012   SpO2 96%   Visual Acuity Right Eye Distance:   Left Eye Distance:   Bilateral Distance:    Right Eye Near:   Left Eye Near:    Bilateral Near:     Physical Exam Vitals and nursing note reviewed.  Constitutional:      General: She is not in acute distress.    Appearance: Normal appearance.  HENT:     Head: Normocephalic.     Right Ear: Tympanic membrane, ear canal and external ear normal.     Left Ear: Tympanic membrane, ear canal and external ear normal.     Nose: Nose normal.     Right Turbinates: Enlarged and swollen.     Left Turbinates: Enlarged and swollen.     Right Sinus: No maxillary sinus tenderness or frontal sinus tenderness.     Left Sinus: No maxillary sinus tenderness or frontal sinus tenderness.     Mouth/Throat:     Lips: Pink.     Mouth: Mucous membranes are moist.     Pharynx: Uvula midline. Postnasal drip present. No pharyngeal swelling, oropharyngeal exudate, posterior oropharyngeal erythema or uvula swelling.  Eyes:     Extraocular Movements: Extraocular movements intact.     Pupils: Pupils are equal, round, and reactive to light.  Cardiovascular:     Rate and Rhythm: Normal rate  and regular rhythm.     Pulses: Normal pulses.     Heart sounds: Normal heart sounds.  Pulmonary:     Effort: Pulmonary effort is normal. No respiratory distress.     Breath sounds: Normal breath sounds. No stridor. No wheezing, rhonchi or rales.  Abdominal:     General: Bowel sounds are normal.     Palpations: Abdomen is soft.     Tenderness: There is no abdominal tenderness.  Musculoskeletal:     Cervical back: Normal range of motion.  Skin:    General: Skin is warm and dry.  Neurological:     General: No focal deficit present.     Mental Status: She is alert and oriented to person, place, and time.  Psychiatric:         Mood and Affect: Mood normal.        Behavior: Behavior normal.      UC Treatments / Results  Labs (all labs ordered are listed, but only abnormal results are displayed) Labs Reviewed  POC COVID19/FLU A&B COMBO - Normal    EKG   Radiology No results found.  Procedures Procedures (including critical care time)  Medications Ordered in UC Medications - No data to display  Initial Impression / Assessment and Plan / UC Course  I have reviewed the triage vital signs and the nursing notes.  Pertinent labs & imaging results that were available during my care of the patient were reviewed by me and considered in my medical decision making (see chart for details).  COVID/flu test is negative.  Patient with numerous systemic symptoms could to include nausea, body aches, and shortness of breath.  Symptoms appear to be of viral etiology.  Vital signs are mostly stable, room air sats at 96%.  Will provide symptomatic treatment for nausea with ondansetron 4 mg ODT.  Prednisone 40 mg prescribed for asthma exacerbation and shortness of breath.  Supportive care recommendations were provided and discussed with the patient to include fluids, rest, over-the-counter analgesics, and continuing her current asthma regimen.  Patient was given strict ER follow-up precautions.  Patient was in agreement with this plan of care and verbalized understanding.  All questions were answered.  Patient stable for discharge.  Final Clinical Impressions(s) / UC Diagnoses   Final diagnoses:  None   Discharge Instructions   None    ED Prescriptions   None    PDMP not reviewed this encounter.   Abran Cantor, NP 12/17/23 [phone]

## 2024-02-12 ENCOUNTER — Other Ambulatory Visit: Payer: Self-pay | Admitting: Allergy & Immunology

## 2024-03-18 ENCOUNTER — Ambulatory Visit (INDEPENDENT_AMBULATORY_CARE_PROVIDER_SITE_OTHER): Payer: 59 | Admitting: Allergy & Immunology

## 2024-03-18 ENCOUNTER — Other Ambulatory Visit: Payer: Self-pay

## 2024-03-18 ENCOUNTER — Encounter: Payer: Self-pay | Admitting: Allergy & Immunology

## 2024-03-18 VITALS — BP 118/64 | HR 102 | Temp 98.1°F | Resp 18 | Ht 64.96 in | Wt 194.1 lb

## 2024-03-18 DIAGNOSIS — J4540 Moderate persistent asthma, uncomplicated: Secondary | ICD-10-CM

## 2024-03-18 DIAGNOSIS — B999 Unspecified infectious disease: Secondary | ICD-10-CM

## 2024-03-18 DIAGNOSIS — J3089 Other allergic rhinitis: Secondary | ICD-10-CM | POA: Diagnosis not present

## 2024-03-18 DIAGNOSIS — K219 Gastro-esophageal reflux disease without esophagitis: Secondary | ICD-10-CM | POA: Diagnosis not present

## 2024-03-18 DIAGNOSIS — J383 Other diseases of vocal cords: Secondary | ICD-10-CM

## 2024-03-18 DIAGNOSIS — J302 Other seasonal allergic rhinitis: Secondary | ICD-10-CM

## 2024-03-18 MED ORDER — FLUTICASONE PROPIONATE 50 MCG/ACT NA SUSP: 2.0000 | Freq: Every day | NASAL | 3 refills | Status: AC | PRN

## 2024-03-18 MED ORDER — FLUOCINOLONE ACETONIDE 0.01 % EX SOLN: CUTANEOUS | 2 refills | Status: AC

## 2024-03-18 MED ORDER — MONTELUKAST SODIUM 10 MG PO TABS: 10.0000 mg | ORAL_TABLET | Freq: Every morning | ORAL | 3 refills | Status: AC

## 2024-03-18 MED ORDER — ALBUTEROL SULFATE HFA 108 (90 BASE) MCG/ACT IN AERS: 2.0000 | INHALATION_SPRAY | RESPIRATORY_TRACT | 1 refills | Status: AC | PRN

## 2024-03-18 MED ORDER — FLUTICASONE-SALMETEROL 115-21 MCG/ACT IN AERO: 2.0000 | INHALATION_SPRAY | Freq: Two times a day (BID) | RESPIRATORY_TRACT | 5 refills | Status: AC

## 2024-03-18 MED ORDER — LEVOCETIRIZINE DIHYDROCHLORIDE 5 MG PO TABS: ORAL_TABLET | ORAL | 3 refills | Status: AC

## 2024-03-18 NOTE — Patient Instructions (Addendum)
 1. Moderate persistent asthma, uncomplicated - Lung testing looks awesome today. - You have an excellent handle on your symptoms.  - Daily controller medication(s): Singulair  10mg  EVERY DAY and Advair 115/45mcg two puffs twice daily DURING SEASON CHANGES  - Prior to physical activity: albuterol  2 puffs 10-15 minutes before physical activity. - Rescue medications: albuterol  4 puffs every 4-6 hours as needed - Changes during respiratory infections or worsening symptoms: Increase Advair 115/21mcg to 4 puffs twice daily for TWO WEEKS. - Asthma control goals:  * Full participation in all desired activities (may need albuterol  before activity) * Albuterol  use two time or less a week on average (not counting use with activity) * Cough interfering with sleep two time or less a month * Oral steroids no more than once a year * No hospitalizations  2. Chronic rhinitis (weeds, grasses, indoor molds, outdoor molds, dust mites, cat and dog) - Continue with: Xyzal  (levocetirizine) 5mg  tablet once daily and Singulair  (montelukast ) 10mg  daily - Continue with: Flonase  (fluticasone ) two sprays per nostril daily   3. Reflux - Continue with Prilosec daily.  4. Recurrent infections - Labs ordered last time, but your frequency of infections seems to have improved.  - No need to get these right now. - we can always pursue this if needed.  5. Eczema in the ear  - Start fluocinolone oil two drops per ear daily as needed for itching.   6. Return in about 6 months (around 09/17/2024). You can have the follow up appointment with Dr. Idolina Maker or a Nurse Practicioner (our Nurse Practitioners are excellent and always have Physician oversight!).    Please inform us  of any Emergency Department visits, hospitalizations, or changes in symptoms. Call us  before going to the ED for breathing or allergy symptoms since we might be able to fit you in for a sick visit. Feel free to contact us  anytime with any questions,  problems, or concerns.  It was a pleasure to see you again today!  Websites that have reliable patient information: 1. American Academy of Asthma, Allergy, and Immunology: www.aaaai.org 2. Food Allergy Research and Education (FARE): foodallergy.org 3. Mothers of Asthmatics: http://www.asthmacommunitynetwork.org 4. American College of Allergy, Asthma, and Immunology: www.acaai.org      "Like" us  on Facebook and Instagram for our latest updates!      A healthy democracy works best when [employer] participate! Make sure you are registered to vote! If you have moved or changed any of your contact information, you will need to get this updated before voting! Scan the QR codes below to learn more!

## 2024-03-18 NOTE — Progress Notes (Signed)
 FOLLOW UP  Date of Service/Encounter:  03/18/24   Assessment:   Moderate persistent asthma - likely with VCD component    Seasonal and perennial allergic rhinitis (weeds, grasses, indoor molds, outdoor molds, dust mites, cat and dog)   Chronic back pain - on disability from [place] at this time  Plan/Recommendations:   1. Moderate persistent asthma, uncomplicated - Lung testing looks awesome today. - You have an excellent handle on your symptoms.  - Daily controller medication(s): Singulair  10mg  EVERY DAY and Advair 115/68mcg two puffs twice daily DURING SEASON CHANGES  - Prior to physical activity: albuterol  2 puffs 10-15 minutes before physical activity. - Rescue medications: albuterol  4 puffs every 4-6 hours as needed - Changes during respiratory infections or worsening symptoms: Increase Advair 115/21mcg to 4 puffs twice daily for TWO WEEKS. - Asthma control goals:  * Full participation in all desired activities (may need albuterol  before activity) * Albuterol  use two time or less a week on average (not counting use with activity) * Cough interfering with sleep two time or less a month * Oral steroids no more than once a year * No hospitalizations  2. Chronic rhinitis (weeds, grasses, indoor molds, outdoor molds, dust mites, cat and dog) - Continue with: Xyzal  (levocetirizine) 5mg  tablet once daily and Singulair  (montelukast ) 10mg  daily - Continue with: Flonase  (fluticasone ) two sprays per nostril daily   3. Reflux - Continue with Prilosec daily.  4. Recurrent infections - Labs ordered last time, but your frequency of infections seems to have improved.  - No need to get these right now. - we can always pursue this if needed.  5. Eczema in the ear  - Start fluocinolone  oil two drops per ear daily as needed for itching.   6. Return in about 6 months (around 09/17/2024). You can have the follow up appointment with Dr. Idolina Maker or a Nurse Practicioner (our  Nurse Practitioners are excellent and always have Physician oversight!).   Subjective:   Jodi [name] is a 48 y.o. female presenting today for follow up of  Chief Complaint  Patient presents with   Establish Care    Asthma doing well   Follow-up    Jodi [name] has a history of the following: Patient Active Problem List   Diagnosis Date Noted   Seasonal and perennial allergic rhinoconjunctivitis 02/05/2020   Paradoxical vocal cord motion 02/05/2020   Moderate persistent asthma, uncomplicated 09/24/2017   Seasonal and perennial allergic rhinitis 09/24/2017   SI joint arthritis (HCC) 02/21/2016   SI (sacroiliac) joint dysfunction 09/04/2015   Fall from horse 07/30/2015   Lumbar transverse process fracture (HCC) 07/30/2015   Pelvic ring fracture (HCC) 07/28/2015   Primary generalized (osteo)arthritis 03/13/2015   Asthma 11/17/2014   Gastroesophageal reflux disease 11/17/2014   Vitamin D  deficiency 11/04/2014   Dyslipidemia 08/11/2012   Lung granuloma (HCC) 07/22/2011   Allergic rhinitis 02/19/2010    History obtained from: chart review and patient.  Discussed the use of AI scribe software for clinical note transcription with the patient and/or guardian, who gave verbal consent to proceed.  Jodi [name] is a 48 y.o. female presenting for a follow up visit.  She was last seen in December 2024.  At that time, lung testing was awesome.  We talked about Biologics for asthma control.  We continue with Singulair  10 mg daily and Advair 2 puffs twice daily during season changes.  For her rhinitis, we continue with Xyzal  as well as montelukast  and Flonase .  She also remained on Prilosec.  We obtained some labs to look at her immune system.  Since last visit, she has done very well.  Asthma/Respiratory Symptom History: She uses Advair as needed for approximately two weeks at a time before stopping. She has not required hospitalization recently for asthma, indicating stable control.  However, she needs more albuterol , suggesting occasional use for symptom relief. Jodi [name]'s asthma has been well controlled. She has not required rescue medication, experienced nocturnal awakenings due to lower respiratory symptoms, nor have activities of daily living been limited. She has required no Emergency Department or Urgent Care visits for her asthma. She has required zero courses of systemic steroids for asthma exacerbations since the last visit. ACT score today is 25, indicating excellent asthma symptom control.   Allergic Rhinitis Symptom History: No recent infections or sinus symptoms are reported. She does need refills of her medications, but she does not use it daily. She feels much better after finishing     GERD Symptom History: She confirms the need for more Prilosec to manage her symptoms.  This combination seems to be working well to control her symptoms.  Infection Symptom History: Previous labs for her immune system were ordered in April, but she has not had any significant issues since then.   Otherwise, there have been no changes to her past medical history, surgical history, family history, or social history.    Review of systems otherwise negative other than that mentioned in the HPI.    Objective:   Blood pressure 118/64, pulse (!) 102, temperature 98.1 F (36.7 C), temperature source Temporal, resp. rate 18, height 5' 4.96" (1.65 m), weight 194 lb 1.6 oz (88 kg), last menstrual period 05/17/2012, SpO2 96%. Body mass index is 32.34 kg/m.    Physical Exam Vitals reviewed.  Constitutional:      General: She is not in acute distress.    Appearance: She is well-developed.     Comments: Smiling. Seems rather zen today.   HENT:     Head: Normocephalic and atraumatic.     Right Ear: Tympanic membrane, ear canal and external ear normal.     Left Ear: Tympanic membrane, ear canal and external ear normal.     Nose: No nasal deformity, septal deviation, mucosal edema or  rhinorrhea.     Right Turbinates: Enlarged, swollen and pale.     Left Turbinates: Enlarged, swollen and pale.     Right Sinus: No maxillary sinus tenderness or frontal sinus tenderness.     Left Sinus: No maxillary sinus tenderness or frontal sinus tenderness.     Comments: No nasal polyps.    Mouth/Throat:     Lips: Pink.     Mouth: Mucous membranes are moist. Mucous membranes are not pale and not dry.     Pharynx: Uvula midline.  Eyes:     General:        Right eye: No discharge.        Left eye: No discharge.     Conjunctiva/sclera: Conjunctivae normal.     Right eye: Right conjunctiva is not injected. No chemosis.    Left eye: Left conjunctiva is not injected. No chemosis.    Pupils: Pupils are equal, round, and reactive to light.  Cardiovascular:     Rate and Rhythm: Normal rate and regular rhythm.     Heart sounds: Normal heart sounds.  Pulmonary:     Effort: Pulmonary effort is normal. No tachypnea, accessory muscle usage or respiratory distress.  Breath sounds: Normal breath sounds. No wheezing, rhonchi or rales.     Comments: Moving air well in all lung fields.  No increased work of breathing. Chest:     Chest wall: No tenderness.  Lymphadenopathy:     Cervical: No cervical adenopathy.  Skin:    General: Skin is warm.     Capillary Refill: Capillary refill takes less than 2 seconds.     Coloration: Skin is not pale.     Findings: No abrasion, erythema, petechiae or rash. Rash is not papular, urticarial or vesicular.     Comments: No eczematous or urticarial lesions noted.  Neurological:     Mental Status: She is alert.  Psychiatric:        Behavior: Behavior is cooperative.      Diagnostic studies:    Spirometry: results normal (FEV1: 2.86/120%, FVC: 3.30/111%, FEV1/FVC: 87%).    Spirometry consistent with normal pattern.   Allergy Studies: none       Jodi Gentles, MD  [facility] 

## 2024-09-23 ENCOUNTER — Other Ambulatory Visit: Payer: Self-pay

## 2024-09-23 ENCOUNTER — Encounter: Payer: Self-pay | Admitting: Allergy & Immunology

## 2024-09-23 ENCOUNTER — Ambulatory Visit (INDEPENDENT_AMBULATORY_CARE_PROVIDER_SITE_OTHER): Admitting: Allergy & Immunology

## 2024-09-23 VITALS — BP 112/78 | HR 74 | Temp 98.7°F | Ht 64.96 in | Wt 180.8 lb

## 2024-09-23 DIAGNOSIS — J302 Other seasonal allergic rhinitis: Secondary | ICD-10-CM

## 2024-09-23 DIAGNOSIS — J4540 Moderate persistent asthma, uncomplicated: Secondary | ICD-10-CM

## 2024-09-23 DIAGNOSIS — K219 Gastro-esophageal reflux disease without esophagitis: Secondary | ICD-10-CM | POA: Diagnosis not present

## 2024-09-23 DIAGNOSIS — J3089 Other allergic rhinitis: Secondary | ICD-10-CM

## 2024-09-23 DIAGNOSIS — B999 Unspecified infectious disease: Secondary | ICD-10-CM

## 2024-09-23 NOTE — Patient Instructions (Addendum)
 1. Moderate persistent asthma, uncomplicated - Lung testing looks awesome today. - In the future, try grabbing the Advair inhaler instead of the albuterol  to help get you through these episodes. - The Advair has the inhaled steroid that can help with inflammation in the lungs when you are flaring.  - Daily controller medication(s): Singulair  10mg  EVERY DAY and Advair 115/47mcg two puffs twice daily DURING SEASON CHANGES  - Prior to physical activity: albuterol  2 puffs 10-15 minutes before physical activity. - Rescue medications: albuterol  4 puffs every 4-6 hours as needed - Changes during respiratory infections or worsening symptoms: Increase Advair 115/21mcg to 4 puffs twice daily for TWO WEEKS. - Asthma control goals:  * Full participation in all desired activities (may need albuterol  before activity) * Albuterol  use two time or less a week on average (not counting use with activity) * Cough interfering with sleep two time or less a month * Oral steroids no more than once a year * No hospitalizations  2. Chronic rhinitis (weeds, grasses, indoor molds, outdoor molds, dust mites, cat and dog) - Continue with: Xyzal  (levocetirizine) 5mg  tablet once daily and Singulair  (montelukast ) 10mg  daily - Continue with: Flonase  (fluticasone ) two sprays per nostril daily   3. Reflux - Continue with Prilosec daily.  4. Recurrent infections - I do not think we need to worry about this immune workup at this point.  5. Eczema in the ear (definitely worse on the right ear) - Continue with the fluocinolone  oil two drops per ear daily as needed for itching.   6. Return in about 6 months (around 03/24/2025). You can have the follow up appointment with Dr. Iva or a Nurse Practicioner (our Nurse Practitioners are excellent and always have Physician oversight!).    Please inform us  of any Emergency Department visits, hospitalizations, or changes in symptoms. Call us  before going to the ED for breathing  or allergy symptoms since we might be able to fit you in for a sick visit. Feel free to contact us  anytime with any questions, problems, or concerns.  It was a pleasure to see you again today!  Websites that have reliable patient information: 1. American Academy of Asthma, Allergy, and Immunology: www.aaaai.org 2. Food Allergy Research and Education (FARE): foodallergy.org 3. Mothers of Asthmatics: http://www.asthmacommunitynetwork.org 4. American College of Allergy, Asthma, and Immunology: www.acaai.org      Like us  on [employer] and Instagram for our latest updates!      A healthy democracy works best when [employer] participate! Make sure you are registered to vote! If you have moved or changed any of your contact information, you will need to get this updated before voting! Scan the QR codes below to learn more!

## 2024-09-23 NOTE — Progress Notes (Unsigned)
 FOLLOW UP  Date of Service/Encounter:  09/23/2024   Assessment:   Moderate persistent asthma - likely with VCD component    Seasonal and perennial allergic rhinitis (weeds, grasses, indoor molds, outdoor molds, dust mites, cat and dog)   Chronic back pain - on disability from [place] and [place] at this time  Plan/Recommendations:   1. Moderate persistent asthma, uncomplicated - Lung testing looks awesome today. - In the future, try grabbing the Advair inhaler instead of the albuterol  to help get you through these episodes. - The Advair has the inhaled steroid that can help with inflammation in the lungs when you are flaring.  - Daily controller medication(s): Singulair  10mg  EVERY DAY and Advair 115/76mcg two puffs twice daily DURING SEASON CHANGES  - Prior to physical activity: albuterol  2 puffs 10-15 minutes before physical activity. - Rescue medications: albuterol  4 puffs every 4-6 hours as needed - Changes during respiratory infections or worsening symptoms: Increase Advair 115/21mcg to 4 puffs twice daily for TWO WEEKS. - Asthma control goals:  * Full participation in all desired activities (may need albuterol  before activity) * Albuterol  use two time or less a week on average (not counting use with activity) * Cough interfering with sleep two time or less a month * Oral steroids no more than once a year * No hospitalizations  2. Chronic rhinitis (weeds, grasses, indoor molds, outdoor molds, dust mites, cat and dog) - Continue with: Xyzal  (levocetirizine) 5mg  tablet once daily and Singulair  (montelukast ) 10mg  daily - Continue with: Flonase  (fluticasone ) two sprays per nostril daily   3. Reflux - Continue with Prilosec daily.  4. Recurrent infections - I do not think we need to worry about this immune workup at this point.  5. Eczema in the ear (definitely worse on the right ear) - Continue with the fluocinolone  oil two drops per ear daily as needed for itching.   6.  Return in about 6 months (around 03/24/2025). You can have the follow up appointment with Dr. Iva or a Nurse Practicioner (our Nurse Practitioners are excellent and always have Physician oversight!).    Subjective:   Jodi [name] is a 48 y.o. female presenting today for follow up of  Chief Complaint  Patient presents with   Asthma   Seasonal and perennial allergic rhinitis   Recurrent Skin Infections   Follow-up    Jodi [name] has a history of the following: Patient Active Problem List   Diagnosis Date Noted   Seasonal and perennial allergic rhinoconjunctivitis 02/05/2020   Paradoxical vocal cord motion 02/05/2020   Moderate persistent asthma, uncomplicated 09/24/2017   Seasonal and perennial allergic rhinitis 09/24/2017   SI joint arthritis 02/21/2016   SI (sacroiliac) joint dysfunction 09/04/2015   Fall from horse 07/30/2015   Lumbar transverse process fracture (HCC) 07/30/2015   Pelvic ring fracture (HCC) 07/28/2015   Primary generalized (osteo)arthritis 03/13/2015   Asthma 11/17/2014   Gastroesophageal reflux disease 11/17/2014   Vitamin D  deficiency 11/04/2014   Dyslipidemia 08/11/2012   Lung granuloma (HCC) 07/22/2011   Allergic rhinitis 02/19/2010    History obtained from: chart review and patient.  Discussed the use of AI scribe software for clinical note transcription with the patient and/or guardian, who gave verbal consent to proceed.  Jodi [name] is a 48 y.o. female presenting for a follow up visit.  She was last seen in June 2025.  At that time, her like testing looked amazing.  We continue with the Singulair  10 mg daily and Advair  150 mcg 2 puffs twice daily during season changes only.  She also was on albuterol  as needed.  For her rhinitis, we continue with Xyzal  as well as Singulair  and Flonase .  For her recurrent infections, we reordered the lab work, but her frequency of infections had improved.  We did start fluocinolone  oil 2 drops per ear daily as  needed for itching.  Since last visit, she has done well.  She describes her back pain as severe, affecting her side and back significantly. She has tried various treatments, including injections, which do not provide long-lasting relief, and physical therapy. She is unable to get more injections until January and is waiting to retry for a spinal stimulator. She has used pain medications but prefers not to rely on them. She occasionally uses edibles for pain relief but dislikes feeling out of control.  She has a history of high cholesterol, which she discovered was not being managed due to a misunderstanding with her medication refills. She has since resumed taking her cholesterol medication.    Asthma/Respiratory Symptom History: Her breathing has been generally stable, but she experienced increased discomfort today due to the weather and exposure to a machine that aggravated her symptoms. She has intermittent chest pain and coughing, though these symptoms are not constant. She has not used Advair this year and only plans to use it when the weather changes. She last refilled it a while ago and does not use albuterol  regularly.  Allergic Rhinitis Symptom History: Her allergies are controlled with Singulair  and Flonase , and she has not needed antibiotics for her sinuses recently. She has not been on antibiotics recently.   Skin Symptom History: She mentions a past issue with eczema in her ears, which she treated with fluocinolone  oil, though she is unsure of its effectiveness. She notes that menopause may be contributing to her symptoms.  GERD Symptom History: She remains on Prilosec daily. This seems to be working well to control her GERD.   Otherwise, there have been no changes to her past medical history, surgical history, family history, or social history.    Review of systems otherwise negative other than that mentioned in the HPI.    Objective:   Blood pressure 112/78, pulse 74,  temperature 98.7 F (37.1 C), temperature source Temporal, height 5' 4.96 (1.65 m), weight 180 lb 12.8 oz (82 kg), last menstrual period 05/17/2012, SpO2 98%. Body mass index is 30.12 kg/m.    Physical Exam Vitals reviewed.  Constitutional:      General: She is not in acute distress.    Appearance: She is well-developed.     Comments: Smiling. Seems rather zen today.   HENT:     Head: Normocephalic and atraumatic.     Right Ear: Tympanic membrane, ear canal and external ear normal.     Left Ear: Tympanic membrane, ear canal and external ear normal.     Nose: No nasal deformity, septal deviation, mucosal edema or rhinorrhea.     Right Turbinates: Enlarged, swollen and pale.     Left Turbinates: Enlarged, swollen and pale.     Right Sinus: No maxillary sinus tenderness or frontal sinus tenderness.     Left Sinus: No maxillary sinus tenderness or frontal sinus tenderness.     Comments: No nasal polyps.    Mouth/Throat:     Lips: Pink.     Mouth: Mucous membranes are moist. Mucous membranes are not pale and not dry.     Pharynx: Uvula midline.  Eyes:  General:        Right eye: No discharge.        Left eye: No discharge.     Conjunctiva/sclera: Conjunctivae normal.     Right eye: Right conjunctiva is not injected. No chemosis.    Left eye: Left conjunctiva is not injected. No chemosis.    Pupils: Pupils are equal, round, and reactive to light.  Cardiovascular:     Rate and Rhythm: Normal rate and regular rhythm.     Heart sounds: Normal heart sounds.  Pulmonary:     Effort: Pulmonary effort is normal. No tachypnea, accessory muscle usage or respiratory distress.     Breath sounds: Normal breath sounds. No wheezing, rhonchi or rales.     Comments: Moving air well in all lung fields.  No increased work of breathing. Chest:     Chest wall: No tenderness.  Lymphadenopathy:     Cervical: No cervical adenopathy.  Skin:    General: Skin is warm.     Capillary Refill:  Capillary refill takes less than 2 seconds.     Coloration: Skin is not pale.     Findings: No abrasion, erythema, petechiae or rash. Rash is not papular, urticarial or vesicular.     Comments: No eczematous or urticarial lesions noted.  Neurological:     Mental Status: She is alert.  Psychiatric:        Behavior: Behavior is cooperative.      Diagnostic studies:    Spirometry: results normal (FEV1: 2.74/115%, FVC: 3.54/120%, FEV1/FVC: 77%).    Spirometry consistent with normal pattern.    Allergy Studies: none        Marty Shaggy, MD  [facility] 

## 2024-09-26 ENCOUNTER — Encounter: Payer: Self-pay | Admitting: Allergy & Immunology

## 2024-10-17 ENCOUNTER — Telehealth: Payer: Self-pay

## 2025-03-29 ENCOUNTER — Ambulatory Visit: Admitting: Allergy & Immunology
